# Patient Record
Sex: Female | Born: 1950 | Race: Black or African American | Hispanic: No | Smoking: Former smoker
Health system: Southern US, Community
[De-identification: ages and names within clinical notes are randomized; demographics above are authoritative.]

## PROBLEM LIST (undated history)

## (undated) DIAGNOSIS — M1990 Unspecified osteoarthritis, unspecified site: Secondary | ICD-10-CM

## (undated) DIAGNOSIS — N2889 Other specified disorders of kidney and ureter: Secondary | ICD-10-CM

## (undated) DIAGNOSIS — J45909 Unspecified asthma, uncomplicated: Secondary | ICD-10-CM

## (undated) DIAGNOSIS — C801 Malignant (primary) neoplasm, unspecified: Secondary | ICD-10-CM

## (undated) DIAGNOSIS — E079 Disorder of thyroid, unspecified: Secondary | ICD-10-CM

## (undated) DIAGNOSIS — R0602 Shortness of breath: Secondary | ICD-10-CM

## (undated) DIAGNOSIS — I8390 Asymptomatic varicose veins of unspecified lower extremity: Secondary | ICD-10-CM

## (undated) DIAGNOSIS — D689 Coagulation defect, unspecified: Secondary | ICD-10-CM

## (undated) DIAGNOSIS — R112 Nausea with vomiting, unspecified: Secondary | ICD-10-CM

## (undated) DIAGNOSIS — Z9889 Other specified postprocedural states: Secondary | ICD-10-CM

## (undated) DIAGNOSIS — I10 Essential (primary) hypertension: Secondary | ICD-10-CM

## (undated) HISTORY — PX: ABDOMINAL HYSTERECTOMY: SHX81

## (undated) HISTORY — DX: Disorder of thyroid, unspecified: E07.9

## (undated) HISTORY — DX: Other specified disorders of kidney and ureter: N28.89

## (undated) HISTORY — DX: Coagulation defect, unspecified: D68.9

## (undated) HISTORY — PX: VARICOSE VEIN SURGERY: SHX832

## (undated) HISTORY — DX: Shortness of breath: R06.02

## (undated) HISTORY — DX: Asymptomatic varicose veins of unspecified lower extremity: I83.90

---

## 2003-02-15 ENCOUNTER — Other Ambulatory Visit: Payer: Self-pay

## 2003-03-13 ENCOUNTER — Other Ambulatory Visit: Payer: Self-pay

## 2005-09-01 ENCOUNTER — Emergency Department: Payer: Self-pay | Admitting: Emergency Medicine

## 2006-09-23 ENCOUNTER — Inpatient Hospital Stay: Payer: Self-pay | Admitting: Surgery

## 2008-01-21 ENCOUNTER — Emergency Department: Payer: Self-pay | Admitting: Emergency Medicine

## 2008-03-24 ENCOUNTER — Ambulatory Visit: Payer: Self-pay

## 2008-08-20 ENCOUNTER — Emergency Department: Payer: Self-pay | Admitting: Emergency Medicine

## 2008-09-15 ENCOUNTER — Observation Stay: Payer: Self-pay | Admitting: Internal Medicine

## 2009-02-11 ENCOUNTER — Emergency Department: Payer: Self-pay | Admitting: Emergency Medicine

## 2009-05-25 ENCOUNTER — Emergency Department: Payer: Self-pay | Admitting: Emergency Medicine

## 2009-09-22 ENCOUNTER — Emergency Department: Payer: Self-pay | Admitting: Emergency Medicine

## 2010-01-09 ENCOUNTER — Emergency Department: Payer: Self-pay | Admitting: Emergency Medicine

## 2011-04-02 ENCOUNTER — Emergency Department: Payer: Self-pay | Admitting: Emergency Medicine

## 2011-09-13 ENCOUNTER — Ambulatory Visit: Payer: Self-pay | Admitting: Family Medicine

## 2012-03-11 ENCOUNTER — Emergency Department: Payer: Self-pay | Admitting: Emergency Medicine

## 2012-03-11 LAB — URINALYSIS, COMPLETE
Glucose,UR: NEGATIVE mg/dL (ref 0–75)
Nitrite: NEGATIVE
Ph: 5 (ref 4.5–8.0)
Protein: 30
RBC,UR: 1991 /HPF (ref 0–5)
Specific Gravity: 1.027 (ref 1.003–1.030)
Squamous Epithelial: 4

## 2012-03-11 LAB — COMPREHENSIVE METABOLIC PANEL
Albumin: 3.6 g/dL (ref 3.4–5.0)
Alkaline Phosphatase: 62 U/L (ref 50–136)
Anion Gap: 7 (ref 7–16)
BUN: 18 mg/dL (ref 7–18)
Co2: 24 mmol/L (ref 21–32)
EGFR (African American): >60
EGFR (Non-African Amer.): 56 — ABNORMAL LOW
Glucose: 102 mg/dL — ABNORMAL HIGH (ref 65–99)
Potassium: 2.9 mmol/L — ABNORMAL LOW (ref 3.5–5.1)
SGOT(AST): 19 U/L (ref 15–37)
SGPT (ALT): 16 U/L (ref 12–78)
Sodium: 138 mmol/L (ref 136–145)

## 2012-03-11 LAB — CBC
HGB: 13.7 g/dL (ref 12.0–16.0)
MCV: 89 fL (ref 80–100)
Platelet: 329 10*3/uL (ref 150–440)
WBC: 8.9 10*3/uL (ref 3.6–11.0)

## 2012-03-11 LAB — LIPASE, BLOOD: Lipase: 100 U/L (ref 73–393)

## 2012-03-12 LAB — URINE CULTURE

## 2012-06-09 ENCOUNTER — Emergency Department: Payer: Self-pay | Admitting: Internal Medicine

## 2013-04-10 ENCOUNTER — Ambulatory Visit: Payer: Self-pay | Admitting: Physician Assistant

## 2013-05-03 ENCOUNTER — Ambulatory Visit: Payer: Self-pay | Admitting: Family Medicine

## 2013-06-18 ENCOUNTER — Ambulatory Visit: Payer: Self-pay

## 2013-10-20 ENCOUNTER — Ambulatory Visit: Payer: Self-pay | Admitting: Family Medicine

## 2013-11-03 ENCOUNTER — Emergency Department: Payer: Self-pay | Admitting: Student

## 2014-03-13 ENCOUNTER — Emergency Department: Payer: Self-pay | Admitting: Emergency Medicine

## 2014-03-29 ENCOUNTER — Ambulatory Visit: Payer: Self-pay | Admitting: Family Medicine

## 2014-04-12 ENCOUNTER — Emergency Department: Payer: Self-pay | Admitting: Emergency Medicine

## 2014-04-12 LAB — CBC WITH DIFFERENTIAL/PLATELET
Basophil #: 0.1 10*3/uL (ref 0.0–0.1)
Basophil %: 1 %
Eosinophil #: 0.2 10*3/uL (ref 0.0–0.7)
Eosinophil %: 3.7 %
HCT: 39.2 % (ref 35.0–47.0)
HGB: 12.8 g/dL (ref 12.0–16.0)
LYMPHS PCT: 39.7 %
Lymphocyte #: 2.4 10*3/uL (ref 1.0–3.6)
MCH: 28.6 pg (ref 26.0–34.0)
MCHC: 32.7 g/dL (ref 32.0–36.0)
MCV: 87 fL (ref 80–100)
Monocyte #: 0.3 x10 3/mm (ref 0.2–0.9)
Monocyte %: 5.3 %
NEUTROS PCT: 50.3 %
Neutrophil #: 3.1 10*3/uL (ref 1.4–6.5)
Platelet: 324 10*3/uL (ref 150–440)
RBC: 4.49 10*6/uL (ref 3.80–5.20)
RDW: 13.3 % (ref 11.5–14.5)
WBC: 6.1 10*3/uL (ref 3.6–11.0)

## 2014-04-12 LAB — BASIC METABOLIC PANEL
ANION GAP: 7 (ref 7–16)
BUN: 17 mg/dL
CALCIUM: 9.2 mg/dL
CHLORIDE: 109 mmol/L
CO2: 25 mmol/L
Creatinine: 0.64 mg/dL
EGFR (African American): >60
EGFR (Non-African Amer.): >60
Glucose: 95 mg/dL
Potassium: 3.4 mmol/L — ABNORMAL LOW
Sodium: 141 mmol/L

## 2014-04-12 LAB — PRO B NATRIURETIC PEPTIDE: B-TYPE NATIURETIC PEPTID: 70 pg/mL

## 2014-09-08 ENCOUNTER — Emergency Department

## 2014-09-08 ENCOUNTER — Encounter: Payer: Self-pay | Admitting: Emergency Medicine

## 2014-09-08 ENCOUNTER — Observation Stay
Admission: EM | Admit: 2014-09-08 | Discharge: 2014-09-09 | Disposition: A | Discharge status: Home Health | Attending: Internal Medicine | Admitting: Internal Medicine

## 2014-09-08 DIAGNOSIS — R739 Hyperglycemia, unspecified: Secondary | ICD-10-CM | POA: Insufficient documentation

## 2014-09-08 DIAGNOSIS — F449 Dissociative and conversion disorder, unspecified: Principal | ICD-10-CM

## 2014-09-08 DIAGNOSIS — R531 Weakness: Secondary | ICD-10-CM

## 2014-09-08 DIAGNOSIS — F411 Generalized anxiety disorder: Secondary | ICD-10-CM | POA: Insufficient documentation

## 2014-09-08 DIAGNOSIS — R4182 Altered mental status, unspecified: Secondary | ICD-10-CM | POA: Diagnosis not present

## 2014-09-08 DIAGNOSIS — F419 Anxiety disorder, unspecified: Secondary | ICD-10-CM | POA: Diagnosis present

## 2014-09-08 DIAGNOSIS — R200 Anesthesia of skin: Secondary | ICD-10-CM | POA: Diagnosis not present

## 2014-09-08 DIAGNOSIS — I639 Cerebral infarction, unspecified: Secondary | ICD-10-CM | POA: Diagnosis present

## 2014-09-08 DIAGNOSIS — N390 Urinary tract infection, site not specified: Secondary | ICD-10-CM | POA: Diagnosis not present

## 2014-09-08 DIAGNOSIS — Z86718 Personal history of other venous thrombosis and embolism: Secondary | ICD-10-CM | POA: Diagnosis not present

## 2014-09-08 DIAGNOSIS — Z79899 Other long term (current) drug therapy: Secondary | ICD-10-CM | POA: Diagnosis not present

## 2014-09-08 DIAGNOSIS — I10 Essential (primary) hypertension: Secondary | ICD-10-CM | POA: Diagnosis not present

## 2014-09-08 DIAGNOSIS — R471 Dysarthria and anarthria: Secondary | ICD-10-CM | POA: Diagnosis not present

## 2014-09-08 DIAGNOSIS — R0602 Shortness of breath: Secondary | ICD-10-CM | POA: Diagnosis not present

## 2014-09-08 DIAGNOSIS — C801 Malignant (primary) neoplasm, unspecified: Secondary | ICD-10-CM | POA: Diagnosis not present

## 2014-09-08 DIAGNOSIS — E876 Hypokalemia: Secondary | ICD-10-CM | POA: Diagnosis not present

## 2014-09-08 DIAGNOSIS — J9811 Atelectasis: Secondary | ICD-10-CM | POA: Diagnosis not present

## 2014-09-08 HISTORY — DX: Essential (primary) hypertension: I10

## 2014-09-08 LAB — URINALYSIS COMPLETE WITH MICROSCOPIC (ARMC ONLY)
Bilirubin Urine: NEGATIVE
GLUCOSE, UA: NEGATIVE mg/dL
KETONES UR: NEGATIVE mg/dL
NITRITE: NEGATIVE
Protein, ur: NEGATIVE mg/dL
SPECIFIC GRAVITY, URINE: 1.008 (ref 1.005–1.030)
pH: 7 (ref 5.0–8.0)

## 2014-09-08 LAB — BASIC METABOLIC PANEL
Anion gap: 10 (ref 5–15)
BUN: 14 mg/dL (ref 6–20)
CO2: 24 mmol/L (ref 22–32)
Calcium: 9.9 mg/dL (ref 8.9–10.3)
Chloride: 107 mmol/L (ref 101–111)
Creatinine, Ser: 0.88 mg/dL (ref 0.44–1.00)
GFR calc Af Amer: >60 mL/min (ref >60)
GFR calc non Af Amer: >60 mL/min (ref >60)
Glucose, Bld: 104 mg/dL — ABNORMAL HIGH (ref 65–99)
Potassium: 3.4 mmol/L — ABNORMAL LOW (ref 3.5–5.1)
Sodium: 141 mmol/L (ref 135–145)

## 2014-09-08 LAB — CBC WITH DIFFERENTIAL/PLATELET
Basophils Absolute: 0 10*3/uL (ref 0–0.1)
Basophils Relative: 0 %
Eosinophils Absolute: 0 10*3/uL (ref 0–0.7)
Eosinophils Relative: 1 %
HCT: 40 % (ref 35.0–47.0)
Hemoglobin: 13.1 g/dL (ref 12.0–16.0)
Lymphocytes Relative: 19 %
Lymphs Abs: 1.4 10*3/uL (ref 1.0–3.6)
MCH: 28.3 pg (ref 26.0–34.0)
MCHC: 32.8 g/dL (ref 32.0–36.0)
MCV: 86 fL (ref 80.0–100.0)
Monocytes Absolute: 0.4 10*3/uL (ref 0.2–0.9)
Monocytes Relative: 5 %
Neutro Abs: 5.3 10*3/uL (ref 1.4–6.5)
Neutrophils Relative %: 75 %
Platelets: 322 10*3/uL (ref 150–440)
RBC: 4.65 MIL/uL (ref 3.80–5.20)
RDW: 13.5 % (ref 11.5–14.5)
WBC: 7.1 10*3/uL (ref 3.6–11.0)

## 2014-09-08 LAB — PREGNANCY, URINE: Preg Test, Ur: NEGATIVE

## 2014-09-08 LAB — FIBRIN DERIVATIVES D-DIMER (ARMC ONLY): Fibrin derivatives D-dimer (ARMC): 1054 — ABNORMAL HIGH (ref 0–499)

## 2014-09-08 LAB — TROPONIN I

## 2014-09-08 MED ORDER — FOSFOMYCIN TROMETHAMINE 3 G PO PACK
3.0000 g | PACK | Freq: Once | ORAL | Status: AC
  Administered 2014-09-09: 3 g via ORAL
  Filled 2014-09-08: qty 3

## 2014-09-08 MED ORDER — SODIUM CHLORIDE 0.9 % IV BOLUS (SEPSIS)
500.0000 mL | Freq: Once | INTRAVENOUS | Status: AC
  Administered 2014-09-08: 500 mL via INTRAVENOUS

## 2014-09-08 MED ORDER — IOHEXOL 300 MG/ML  SOLN
75.0000 mL | Freq: Once | INTRAMUSCULAR | Status: AC | PRN
  Administered 2014-09-08: 75 mL via INTRAVENOUS

## 2014-09-08 MED ORDER — ASPIRIN 81 MG PO CHEW
324.0000 mg | CHEWABLE_TABLET | Freq: Once | ORAL | Status: AC
  Administered 2014-09-08: 324 mg via ORAL
  Filled 2014-09-08: qty 4

## 2014-09-08 NOTE — ED Provider Notes (Signed)
[facility] Emergency Department Provider Note  ____________________________________________  Time seen: Approximately 5:47 PM  I have reviewed the triage vital signs and the nursing notes.   HISTORY  Chief Complaint Numbness and Anxiety  Caveat-history of present illness and review of systems limited second or to the patient's inability to cooperate with history telling. She nods her head to yes and no questions and answers "tight" when asked about the nature of her chest pain but does not verbalize otherwise.  HPI Janet [name] is a 64 y.o. female with history of hypertension, history of "blood clots" who presents for evaluation of shortness of breath, chest pain, generalized weakness. History is provided primarily by EMS as well as her family at bedside. She was at lunch earlier today when she ate some spicy shrimp and reported that she just "couldn't eat it". She has no history of allergy and has had shrimp on multiple occasions without any adverse effect. She then began complaining of chest tightness, screamed out in pain, her body went limp. On EMS arrival, her vital signs were stable, she had a normal glucose, she was hyperventilating and complaining of numbness in her hands, lips, feet, blinking rapidly. She is complaining of tightness in her chest and tells me that it has been constant since it started just prior to arrival. It radiates into her right neck. She has no other pain complaints. She has had this before but denies any history of coronary artery disease. She is unable to tell me about the current severity of her symptoms.  Family reports that she has not been able to talk since this happened earlier today. Of note, her mother was recently diagnosed with cancer and her family was meeting today at lunch and discussing that matter.   Past Medical History  Diagnosis Date  . Hypertension     There are no active problems to display for this  patient.   History reviewed. No pertinent past surgical history.  No current outpatient prescriptions on file.  Allergies Review of patient's allergies indicates no known allergies.  History reviewed. No pertinent family history.  Social History Social History  Substance Use Topics  . Smoking status: Never Smoker   . Smokeless tobacco: None  . Alcohol Use: No    Review of Systems  Cardiovascular: + chest tightness    Caveat-history of present illness and review of systems limited second or to the patient's inability to cooperate with history telling. She nods her head to yes and no questions and answers "tight" when asked about the nature of her chest pain but does not verbalize otherwise. ____________________________________________   PHYSICAL EXAM:  VITAL SIGNS: ED Triage Vitals  Enc Vitals Group     BP 09/08/14 1723 146/76 mmHg     Pulse Rate 09/08/14 1723 90     Resp 09/08/14 1723 24     Temp 09/08/14 1723 97.8 F (36.6 C)     Temp Source 09/08/14 1723 Oral     SpO2 09/08/14 1723 100 %     Weight 09/08/14 1723 209 lb 4.8 oz (94.938 kg)     Height 09/08/14 1723 5\' 7"  (1.702 m)     Head Cir --      Peak Flow --      Pain Score --      Pain Loc --      Pain Edu? --      Excl. in [place]? --     Constitutional: Alert, crying silently, nods  head yes or no to answer questions and only verbalizes "tight" with much prompting when asked about the nature of her chest pain, otherwise, she refuses to speak but follows commands to move all extremities which she appears to do equally but feels too weak to sit up. Eyes: Conjunctivae are normal. PERRL. EOMI. Head: Atraumatic. Nose: No congestion/rhinnorhea. Mouth/Throat: Mucous membranes are moist.  Oropharynx non-erythematous. Neck: No stridor.   Cardiovascular: Normal rate, regular rhythm. Grossly normal heart sounds.  Good peripheral circulation. Respiratory: Normal respiratory effort.  No retractions. Lungs  CTAB. Gastrointestinal: Soft and nontender. No distention. No abdominal bruits. No CVA tenderness. Genitourinary: Deferred Musculoskeletal: No lower extremity tenderness nor edema.  No joint effusions. Neurologic:  Follows commands and moves extremities equally and symmetrically but does not cooperate with formal neurological testing. Skin:  Skin is warm, dry and intact. No rash noted. Venous stasis color change associated with bilateral lower extremities but 2+ DP pulses bilaterally. Psychiatric: Mood is depressed and affect is flat.  ____________________________________________   LABS (all labs ordered are listed, but only abnormal results are displayed)  Labs Reviewed  BASIC METABOLIC PANEL - Abnormal; Notable for the following:    Potassium 3.4 (*)    Glucose, Bld 104 (*)    All other components within normal limits  FIBRIN DERIVATIVES D-DIMER ([facility] ONLY) - Abnormal; Notable for the following:    Fibrin derivatives D-dimer ([facility]) 1054 (*)    All other components within normal limits  URINALYSIS COMPLETEWITH MICROSCOPIC ([facility] ONLY) - Abnormal; Notable for the following:    Color, Urine YELLOW (*)    APPearance HAZY (*)    Hgb urine dipstick 1+ (*)    Leukocytes, UA TRACE (*)    Bacteria, UA RARE (*)    Squamous Epithelial / LPF 0-5 (*)    All other components within normal limits  CBC WITH DIFFERENTIAL/PLATELET  TROPONIN I  PREGNANCY, URINE  URINE DRUG SCREEN, QUALITATIVE ([facility] ONLY)  TROPONIN I   ____________________________________________  EKG  ED ECG REPORT I, Joanne Gavel, the attending physician, personally viewed and interpreted this ECG.   Date: 09/08/2014  EKG Time: 17:34  Rate: 89  Rhythm: normal EKG, normal sinus rhythm  Axis: normal  Intervals:none  ST&T Change: No acute ST segment elevation.  ____________________________________________  RADIOLOGY  CXR  FINDINGS: Trachea is midline. Heart size normal. Lungs are clear. No  pleural fluid.  IMPRESSION: No acute findings.   CT head FINDINGS: Streak artifacts at posterior fossa.  Normal ventricular morphology.  No midline shift or mass effect.  Normal appearance of brain parenchyma.  No intracranial hemorrhage, mass lesion, or acute infarction.  Visualized paranasal sinuses and mastoid air cells clear.  Bones unremarkable.  IMPRESSION: Normal exam.  Venous Doppler ultrasound of bilateral lower extremities  IMPRESSION: No evidence of deep venous thrombosis.  ____________________________________________   PROCEDURES  Procedure(s) performed: None  Critical Care performed: No  ____________________________________________   INITIAL IMPRESSION / ASSESSMENT AND PLAN / ED COURSE  Pertinent labs & imaging results that were available during my care of the patient were reviewed by me and considered in my medical decision making (see chart for details).  Janet [name] is a 64 y.o. female with history of hypertension, history of "blood clots" who presents for evaluation of shortness of breath, chest pain, generalized weakness. On exam, she is tearful, on arrival she was blinking her eyes rapidly, following commands but refusing to speak, only saying a few words. She appears to have a nonfocal  neurological examination and CT head is negative. Her EKG is unremarkable. First troponin is negative though given onset of pain just prior to arrival, will obtain second set. Awaiting BMP and d-dimer. EKG shows no evidence of acute ischemia. Exam is not consistent with acute CVA. Her clinical picture is most consistent with an acute stress reaction or anxiety related reaction in the setting of severe emotional stress which is understandable given her mother's recent diagnosis. Awaiting second troponin, d-dimer, BMP. If she continues not to speak, will obtain psychiatry consult.   ----------------------------------------- 9:20 PM on  09/08/2014 -----------------------------------------  Patient with stable vital signs. She still remains tearful, refusing to speak. Urinalysis with trace leuk esterase, 6-30 white blood cells, rare bacteria. CBC unremarkable. BMP unremarkable. First troponin negative. D-dimer was elevated at 1054. CTA chest negative for any acute PE. Incidentally, an indeterminate lesion was noted arising from the left kidney which could represent a cyst or enhancing neoplasm, nonemergent MRI recommended for follow-up. We'll repeat second troponin, if negative, given her pain has been constant for several hours, EKG is reassuring, I doubt ACS. Will give Aspirin. Not consistent with acute aortic dissection. Given my concern for somatization, will consult specialist on call/telepsychiatry. Her family at bedside is very concerned about her legs stating that her doctor told her "that there might be blood clots and they were going to send her to [facility] to have this checked out". I discussed with them that blood clots in the legs would not be causing her symptoms today however since she is here and we are waiting for the specialist on-call anyway, we'll obtain venous Doppler ultrasound of bilateral lower extremities to evaluate for DVT.  ----------------------------------------- 10:03 PM on 09/08/2014 -----------------------------------------  Urinalysis with trace leuk esterase, few white blood cells. The patient is able to tell me that she has no dysuria, no increased urinary frequency but she does have a history of frequent urinary tract infections. I discussed with her that we will give her 1 dose of fosfomycin here. She is able to whisper to me that she is allergic to "penicillin" when questioned.  ----------------------------------------- 11:01 PM on 09/08/2014 ----------------------------------------- Second troponin pending. Doppler ultrasound of the lower extremities is also pending. Dr. Edison Nasuti,  [facility] has evaluated the  patient, suspect possible conversion disorder however given the patient's complaints of numbness of the hands, her persistent generalized weakness with inability to walk or sit up, acute CVA possible affecting brain stem cannot be ruled out (though this would be exceptionally rare). I discussed the case with the hospitalist for admission for MRI brain, observation.  ____________________________________________   FINAL CLINICAL IMPRESSION(S) / ED DIAGNOSES  Final diagnoses:  Anxiety reaction  Weakness      Joanne Gavel, MD 09/08/14 2356

## 2014-09-08 NOTE — Discharge Instructions (Signed)
You were seen in the emergency department for symptoms which are likely related to a stress reaction. While you are having chest pain, your cardiac enzymes were unremarkable as was the CT scan of your chest. I do not think you are having a heart attack or a blood clot in your lung. Your symptoms did not appear to be consistent with a stroke and your CT scan of her head was normal. Incidentally, on the CT scan of your chest, we were able to see a portion of your left kidney. A small area of the left kidney was concerning for a possible cyst though it is not clear what exactly this abnormality is at this time. You need to follow up with your primary care doctor as soon as possible regarding today's visit so that an MRI of your kidneys can be done to make sure this i isn't something more serious, like a cancer. In any case, this kidney abnormality on your CT scan is not the cause of your symptoms today. See your doctor as soon as possible so that the MRI can be scheduled - Call your doctor first thing in the morning.  Return immediately to the emergency Department for severe or worsening chest pain, difficulty breathing, worsening numbness or weakness, fevers, thoughts of wanting to hurt self or others, hallucinations, fevers, vomiting, vision change or for any other concerns.

## 2014-09-08 NOTE — ED Notes (Signed)
Pt requesting to use restroom and wants to get up.  This nurse and Delilah Shan, RN assessed patient ability to walk.  Pt refusing to walk and lift legs when asked.  Pt placed on bedpan, pt used legs to lift pelvis.

## 2014-09-08 NOTE — ED Notes (Signed)
Pt to ED from dinner with family via EMS c/o numbness to finger tips, toes, legs, and lips.  Per EMS pt mother recently dx with cancer.  Pt denies any pain at this time, only hx of hypertension.  Pt presents hyperventilating and very anxious.  Pt having trouble getting words out at this time.  Pt is A&Ox4 and in NAD at this time.

## 2014-09-09 ENCOUNTER — Encounter: Payer: Self-pay | Admitting: Internal Medicine

## 2014-09-09 ENCOUNTER — Observation Stay

## 2014-09-09 DIAGNOSIS — R531 Weakness: Secondary | ICD-10-CM

## 2014-09-09 DIAGNOSIS — F449 Dissociative and conversion disorder, unspecified: Secondary | ICD-10-CM

## 2014-09-09 LAB — URINE DRUG SCREEN, QUALITATIVE (ARMC ONLY)
AMPHETAMINES, UR SCREEN: NOT DETECTED
BENZODIAZEPINE, UR SCRN: NOT DETECTED
Barbiturates, Ur Screen: NOT DETECTED
Cannabinoid 50 Ng, Ur ~~LOC~~: NOT DETECTED
Cocaine Metabolite,Ur ~~LOC~~: NOT DETECTED
MDMA (Ecstasy)Ur Screen: NOT DETECTED
Methadone Scn, Ur: NOT DETECTED
OPIATE, UR SCREEN: NOT DETECTED
PHENCYCLIDINE (PCP) UR S: NOT DETECTED
Tricyclic, Ur Screen: NOT DETECTED

## 2014-09-09 LAB — TROPONIN I: Troponin I: <0.03 ng/mL (ref <0.031)

## 2014-09-09 LAB — MAGNESIUM: Magnesium: 2.1 mg/dL (ref 1.7–2.4)

## 2014-09-09 LAB — GLUCOSE, CAPILLARY: Glucose-Capillary: 102 mg/dL — ABNORMAL HIGH (ref 65–99)

## 2014-09-09 LAB — POTASSIUM: Potassium: 3.2 mmol/L — ABNORMAL LOW (ref 3.5–5.1)

## 2014-09-09 LAB — HEMOGLOBIN A1C: Hgb A1c MFr Bld: 6 % (ref 4.0–6.0)

## 2014-09-09 MED ORDER — POTASSIUM CHLORIDE CRYS ER 20 MEQ PO TBCR
40.0000 meq | EXTENDED_RELEASE_TABLET | Freq: Once | ORAL | Status: AC
  Administered 2014-09-09: 40 meq via ORAL
  Filled 2014-09-09: qty 2

## 2014-09-09 MED ORDER — ACETAMINOPHEN 650 MG RE SUPP: 650.0000 mg | Freq: Four times a day (QID) | RECTAL | Status: DC | PRN

## 2014-09-09 MED ORDER — ACETAMINOPHEN 325 MG PO TABS: 650.0000 mg | ORAL_TABLET | Freq: Four times a day (QID) | ORAL | Status: DC | PRN

## 2014-09-09 MED ORDER — SODIUM CHLORIDE 0.9 % IJ SOLN
3.0000 mL | Freq: Two times a day (BID) | INTRAMUSCULAR | Status: DC
  Administered 2014-09-09: 3 mL via INTRAVENOUS

## 2014-09-09 MED ORDER — LORAZEPAM 0.5 MG PO TABS: 0.5000 mg | ORAL_TABLET | ORAL | Status: DC | PRN

## 2014-09-09 MED ORDER — ONDANSETRON HCL 4 MG PO TABS: 4.0000 mg | ORAL_TABLET | Freq: Four times a day (QID) | ORAL | Status: DC | PRN

## 2014-09-09 MED ORDER — LISINOPRIL 20 MG PO TABS
40.0000 mg | ORAL_TABLET | Freq: Every day | ORAL | Status: DC
  Administered 2014-09-09: 40 mg via ORAL
  Filled 2014-09-09: qty 2

## 2014-09-09 MED ORDER — ONDANSETRON HCL 4 MG/2ML IJ SOLN: 4.0000 mg | Freq: Four times a day (QID) | INTRAMUSCULAR | Status: DC | PRN

## 2014-09-09 MED ORDER — OXYCODONE HCL 5 MG PO TABS: 5.0000 mg | ORAL_TABLET | ORAL | Status: DC | PRN

## 2014-09-09 MED ORDER — POTASSIUM CHLORIDE 20 MEQ PO PACK
40.0000 meq | PACK | Freq: Two times a day (BID) | ORAL | Status: DC
  Administered 2014-09-09: 40 meq via ORAL
  Filled 2014-09-09: qty 2

## 2014-09-09 MED ORDER — ASPIRIN EC 81 MG PO TBEC
81.0000 mg | DELAYED_RELEASE_TABLET | Freq: Every day | ORAL | Status: DC
  Administered 2014-09-09: 81 mg via ORAL
  Filled 2014-09-09: qty 1

## 2014-09-09 MED ORDER — SODIUM CHLORIDE 0.9 % IV SOLN
INTRAVENOUS | Status: DC
  Administered 2014-09-09 (×2): via INTRAVENOUS

## 2014-09-09 MED ORDER — MORPHINE SULFATE (PF) 2 MG/ML IV SOLN
2.0000 mg | INTRAVENOUS | Status: DC | PRN
Start: 2014-09-09 — End: 2014-09-09

## 2014-09-09 NOTE — Care Management Note (Signed)
Bedside nursing screening done, no ss of aspiration noted. Pt not hold ice in the mouth, able to swallow with no problem.

## 2014-09-09 NOTE — H&P (Signed)
[facility] NAME: Janet [name]    MR#:  [id number]  DATE OF BIRTH:  07-25-1950   DATE OF ADMISSION:  09/08/2014  PRIMARY CARE PHYSICIAN: No primary care provider on file.   REQUESTING/REFERRING PHYSICIAN: Edd Fabian  CHIEF COMPLAINT:   Chief Complaint  Patient presents with  . Numbness  . Anxiety    HISTORY OF PRESENT ILLNESS:  Janet [name]  is a 64 y.o. female with a known history of essential hypertension presenting with acute onset of numbness. She is unable to further describe her medical condition in any detail whatsoever. History obtained from family members present at bedside who also have no idea. Apparently from the information available the patient was eating shrimp pasta, screamed "its so spicy' followed by yelling in pain. She then complained of numbness in her fingertips, toes, mouth. Upon presentation to the hospital she no longer answers questions and is essentially nonverbal. Telepsychiatry was consulted and evaluated the patient for concern of conversion disorder however states she should undergo stroke rule out.  PAST MEDICAL HISTORY:   Past Medical History  Diagnosis Date  . Hypertension     PAST SURGICAL HISTORY:  History reviewed. No pertinent past surgical history.  SOCIAL HISTORY:   Social History  Substance Use Topics  . Smoking status: Never Smoker   . Smokeless tobacco: Not on file  . Alcohol Use: No    FAMILY HISTORY:   Family History  Problem Relation Age of Onset  . Stroke Neg Hx     DRUG ALLERGIES:  No Known Allergies  REVIEW OF SYSTEMS:  Unable to obtain given patient's mental status/medical condition  MEDICATIONS AT HOME:   Prior to Admission medications   Medication Sig Start Date End Date Taking? Authorizing Provider  furosemide (LASIX) 20 MG tablet Take 2 tablets by mouth daily as needed. tk 2 tablets every morning as needed for fluid retention 07/06/14  Yes Historical  Provider, MD  lisinopril (PRINIVIL,ZESTRIL) 40 MG tablet Take 1 tablet by mouth daily. 08/27/14  Yes Historical Provider, MD      VITAL SIGNS:  Blood pressure 155/97, pulse 80, temperature 97.8 F (36.6 C), temperature source Oral, resp. rate 24, height 5\' 7"  (1.702 m), weight 209 lb 4.8 oz (94.938 kg), SpO2 100 %.  PHYSICAL EXAMINATION:   VITAL SIGNS: Filed Vitals:   09/09/14 0030  BP: 155/97  Pulse: 80  Temp:   Resp: 16   GENERAL:64 y.o.female moderate distress given mental status.  HEAD: Normocephalic, atraumatic.  EYES: Pupils equal, round, reactive to light. Unable to assess extraocular muscles given mental status/medical condition. No scleral icterus.  MOUTH: Moist mucosal membrane. Dentition intact. No abscess noted.  EAR, NOSE, THROAT: Clear without exudates. No external lesions.  NECK: Supple. No thyromegaly. No nodules. No JVD.  PULMONARY: Clear to ascultation, without wheeze rails or rhonci. No use of accessory muscles, Good respiratory effort. good air entry bilaterally CHEST: Nontender to palpation.  CARDIOVASCULAR: S1 and S2. Regular rate and rhythm. No murmurs, rubs, or gallops. No edema. Pedal pulses 2+ bilaterally.  GASTROINTESTINAL: Soft, nontender, nondistended. No masses. Positive bowel sounds. No hepatosplenomegaly.  MUSCULOSKELETAL: No swelling, clubbing, or edema. Range of motion full in all extremities.  NEUROLOGIC: Unable to fully assess given mental status/medical condition. She is able to move her fingers and toes on command however is unable to move any proximal muscle group. When her arm is raised above her head and dropped she does not  hit her face SKIN: No ulceration, lesions, rashes, or cyanosis. Skin warm and dry. Turgor intact.  PSYCHIATRIC: Unable to assess given mental status/medical condition     LABORATORY PANEL:   CBC  Recent Labs Lab 09/08/14 1833  WBC 7.1  HGB 13.1  HCT 40.0  PLT 322    ------------------------------------------------------------------------------------------------------------------  Chemistries   Recent Labs Lab 09/08/14 1833  NA 141  K 3.4*  CL 107  CO2 24  GLUCOSE 104*  BUN 14  CREATININE 0.88  CALCIUM 9.9   ------------------------------------------------------------------------------------------------------------------  Cardiac Enzymes  Recent Labs Lab 09/08/14 2153  TROPONINI <0.03   ------------------------------------------------------------------------------------------------------------------  RADIOLOGY:  Ct Head Wo Contrast  09/08/2014   CLINICAL DATA:  Numbness in finger tips, toes, legs and lips, recently diagnosed with cancer (type not specified), hyperventilation, trouble getting words out, history hypertension  EXAM: CT HEAD WITHOUT CONTRAST  TECHNIQUE: Contiguous axial images were obtained from the base of the skull through the vertex without intravenous contrast.  COMPARISON:  None  FINDINGS: Streak artifacts at posterior fossa.  Normal ventricular morphology.  No midline shift or mass effect.  Normal appearance of brain parenchyma.  No intracranial hemorrhage, mass lesion, or acute infarction.  Visualized paranasal sinuses and mastoid air cells clear.  Bones unremarkable.  IMPRESSION: Normal exam.   Electronically Signed   By: Lavonia [name] M.D.   On: 09/08/2014 18:25   Ct Angio Chest Pe W/cm &/or Wo Cm  09/08/2014   CLINICAL DATA:  Numbness to finger tips toes legs and lips. History of hypertension. Hyperventilation and anxious.  EXAM: CT ANGIOGRAPHY CHEST WITH CONTRAST  TECHNIQUE: Multidetector CT imaging of the chest was performed using the standard protocol during bolus administration of intravenous contrast. Multiplanar CT image reconstructions and MIPs were obtained to evaluate the vascular anatomy.  CONTRAST:  40mL OMNIPAQUE IOHEXOL 300 MG/ML  SOLN  COMPARISON:  None.  FINDINGS: Mediastinum: Normal heart size. No  pericardial effusion noted. The esophagus appears normal. Normal appearance of the trachea. No mediastinal or hilar adenopathy. The main pulmonary artery is patent. Left and right pulmonary arteries are also patent without saddle embolus. No lobar or segmental pulmonary artery filling defect identified.  Lungs/Pleura: No pleural effusion identified. Subsegmental atelectasis noted in the posterior lung bases. No airspace consolidation or atelectasis identified.  Upper Abdomen: The visualized liver and spleen are unremarkable. The adrenal glands are unremarkable. The visualized portions of the pancreas normal. There is a suspicious lesion arising from the upper pole of the left kidney. This measures 1.8 cm and 35.9 Hounsfield units postcontrast. This may represent a hyperdense cyst or solid enhancing neoplasm.  Musculoskeletal: The visualized osseous structures are unremarkable. There are no aggressive lytic or sclerotic bone lesions noted.  Review of the MIP images confirms the above findings.  IMPRESSION: 1. No evidence for acute pulmonary embolus. 2. Indeterminate lesion arising from the left kidney. This could represent a hyperdense cyst or solid enhancing neoplasm. Followup imaging with renal mass protocol MRI or CT is recommended. MRI would be the study of choice. These results will be called to the ordering clinician or representative by the Radiologist Assistant, and communication documented in the PACS or zVision Dashboard.   Electronically Signed   By: Kerby Moors M.D.   On: 09/08/2014 20:51   US Venous Img Lower Bilateral  09/08/2014   CLINICAL DATA:  History of blood clots.  EXAM: BILATERAL LOWER EXTREMITY VENOUS DOPPLER ULTRASOUND  TECHNIQUE: Gray-scale sonography with graded compression, as well as color Doppler and  duplex ultrasound were performed to evaluate the lower extremity deep venous systems from the level of the common femoral vein and including the common femoral, femoral, profunda  femoral, popliteal and calf veins including the posterior tibial, peroneal and gastrocnemius veins when visible. The superficial great saphenous vein was also interrogated. Spectral Doppler was utilized to evaluate flow at rest and with distal augmentation maneuvers in the common femoral, femoral and popliteal veins.  COMPARISON:  None.  FINDINGS: RIGHT LOWER EXTREMITY  Common Femoral Vein: No evidence of thrombus. Normal compressibility, respiratory phasicity and response to augmentation.  Saphenofemoral Junction: No evidence of thrombus. Normal compressibility and flow on color Doppler imaging.  Profunda Femoral Vein: No evidence of thrombus. Normal compressibility and flow on color Doppler imaging.  Femoral Vein: No evidence of thrombus. Normal compressibility, respiratory phasicity and response to augmentation.  Popliteal Vein: No evidence of thrombus. Normal compressibility, respiratory phasicity and response to augmentation.  Calf Veins: No evidence of thrombus. Normal compressibility and flow on color Doppler imaging.  Superficial Great Saphenous Vein: No evidence of thrombus. Normal compressibility and flow on color Doppler imaging.  Venous Reflux:  None.  Other Findings:  None.  LEFT LOWER EXTREMITY  Common Femoral Vein: No evidence of thrombus. Normal compressibility, respiratory phasicity and response to augmentation.  Saphenofemoral Junction: No evidence of thrombus. Normal compressibility and flow on color Doppler imaging.  Profunda Femoral Vein: No evidence of thrombus. Normal compressibility and flow on color Doppler imaging.  Femoral Vein: No evidence of thrombus. Normal compressibility, respiratory phasicity and response to augmentation.  Popliteal Vein: No evidence of thrombus. Normal compressibility, respiratory phasicity and response to augmentation.  Calf Veins: No evidence of thrombus. Normal compressibility and flow on color Doppler imaging.  Superficial Great Saphenous Vein: No evidence of  thrombus. Normal compressibility and flow on color Doppler imaging.  Venous Reflux:  None.  Other Findings:  None.  IMPRESSION: No evidence of deep venous thrombosis.   Electronically Signed   By: Kathreen Devoid   On: 09/08/2014 23:51   Dg Chest Portable 1 View  09/08/2014   CLINICAL DATA:  Shortness of breath.  EXAM: PORTABLE CHEST - 1 VIEW  COMPARISON:  09/13/2011.  FINDINGS: Trachea is midline. Heart size normal. Lungs are clear. No pleural fluid.  IMPRESSION: No acute findings.   Electronically Signed   By: Lorin Picket M.D.   On: 09/08/2014 17:33    EKG:   Orders placed or performed during the hospital encounter of 09/08/14  . ED EKG  . ED EKG    IMPRESSION AND PLAN:   64 year old African-American female history of essential hypertension presenting with generalized weakness. 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 Generalized weakness: Likely secondary to above diagnosis, however concern for CVA, will check MRI, place on telemetry, initiate aspirin therapy 3. Hypokalemia: Replace potassium Venous thrombi embolism prophylactic: SCDs   All the records are reviewed and case discussed with ED provider. Management plans discussed with the patient, family and they are in agreement.  CODE STATUS: Full  TOTAL TIME TAKING CARE OF THIS PATIENT: 35 minutes.    Hower,  Karenann Cai.D on 09/09/2014 at 12:45 AM  Between 7am to 6pm - Pager - [phone]  After 6pm: House Pager: - [phone]  [facility]  Office  [phone]  CC: Primary care physician; No primary care provider on file.

## 2014-09-09 NOTE — Progress Notes (Signed)
Talked with Dr Lewie Loron about family needing to transfer patient to [place], MD does not want to give orders, states she will get hold of Dr Ether Griffins for orders, family updated. Family very upset at this time.

## 2014-09-09 NOTE — Discharge Summary (Signed)
[facility] [place] NAME: Janet [name]    MR#:  [id number]  DATE OF BIRTH:  [birth year]  DATE OF ADMISSION:  09/08/2014 ADMITTING PHYSICIAN: Lytle Butte, MD  DATE OF DISCHARGE: 09/09/2014  6:54 PM  PRIMARY CARE PHYSICIAN: No primary care provider on file.     ADMISSION DIAGNOSIS:  Anxiety reaction [F41.9] Weakness [R53.1] CVA (cerebral infarction) [I63.9] Hx of blood clots [Z86.718]  DISCHARGE DIAGNOSIS:  Principal Problem:   Conversion disorder Active Problems:   Generalized weakness   SECONDARY DIAGNOSIS:   Past Medical History  Diagnosis Date  . Hypertension     .pro HOSPITAL COURSE:  The patient is 64 year old African-American female who presents to the hospital with numbness and anxiety after she was diagnosed with unknown cancer. She refused to speak, although was able to verbalize "tight" In emergency room, prompting admission for altered mental status, suspected conversion disorder. She underwent MRI of brain which showed no stroke and was seen by DR Clapacs, psychiatrist, who felt that patient indeed has conversion disorder, but did not recommend psychiatric hospitalization or medical therapy. Family was clearly not satisfied and requested transfer to [facility], which i refused due to no medical reason for transfer. Family decided to get patient discharged, discharge instructions prepared, to be discharged with home health PT and psychiatric nurse. Discussion by problem 1. Conversion disorder, appreciate psychiatrist input,  as well as physical therapist evaluation. Discharge home with home health PT and psych nurse follow up. MRI of the brain was negative for stroke.  2. Urinary tract infection?, Given fosfomycin in the emergency room, no further  intervention  3. Hypokalemia. Supplemented IV, magnesium levels was good 4. Hyperglycemia. Hemoglobin A1c is 6.0, no diabetes 5. Unknown cancer, needs supportive therapy,  psychologist as outpatient. Psych nurse follow up at home.  DISCHARGE CONDITIONS:   stable  CONSULTS OBTAINED:  Treatment Team:  Lytle Butte, MD Gonzella Lex, MD  DRUG ALLERGIES:  No Known Allergies  DISCHARGE MEDICATIONS:   Discharge Medication List as of 09/09/2014  6:29 PM    CONTINUE these medications which have NOT CHANGED   Details  furosemide (LASIX) 20 MG tablet Take 2 tablets by mouth daily as needed. tk 2 tablets every morning as needed for fluid retention, Starting 07/06/2014, Until Discontinued, Historical Med    lisinopril (PRINIVIL,ZESTRIL) 40 MG tablet Take 1 tablet by mouth daily., Starting 08/27/2014, Until Discontinued, Historical Med         DISCHARGE INSTRUCTIONS:    Follow up with PCP in few days after discharge.  If you experience worsening of your admission symptoms, develop shortness of breath, life threatening emergency, suicidal or homicidal thoughts you must seek medical attention immediately by calling 911 or calling your MD immediately  if symptoms less severe.  You Must read complete instructions/literature along with all the possible adverse reactions/side effects for all the Medicines you take and that have been prescribed to you. Take any new Medicines after you have completely understood and accept all the possible adverse reactions/side effects.   Please note  You were cared for by a hospitalist during your hospital stay. If you have any questions about your discharge medications or the care you received while you were in the hospital after you are discharged, you can call the unit and asked to speak with the hospitalist on call if the hospitalist that took care of you is not available. Once you are discharged, your primary care physician will  handle any further medical issues. Please note that NO REFILLS for any discharge medications will be authorized once you are discharged, as it is imperative that you return to your primary care  physician (or establish a relationship with a primary care physician if you do not have one) for your aftercare needs so that they can reassess your need for medications and monitor your lab values.    Today   CHIEF COMPLAINT:   Chief Complaint  Patient presents with  . Numbness  . Anxiety    HISTORY OF PRESENT ILLNESS:  Janet [name]  is a 64 y.o. female with a known history of HTN  who presents to the hospital with numbness and anxiety after she was diagnosed with unknown cancer. She refused to speak, although was able to verbalize "tight" In emergency room, prompting admission for altered mental status, suspected conversion disorder. She underwent MRI of brain which showed no stroke and was seen by DR Clapacs, psychiatrist, who felt that patient indeed has conversion disorder, but did not recommend psychiatric hospitalization or medical therapy. Family was clearly not satisfied and requested transfer to [facility], which i refused due to no medical reason for transfer. Family decided to get patient discharged, discharge instructions prepared, to be discharged with home health PT and psychiatric nurse. Discussion by problem 1. Conversion disorder, appreciate psychiatrist input,  as well as physical therapist evaluation. Discharge home with home health PT and psych nurse follow up. MRI of the brain was negative for stroke.  2. Urinary tract infection?, Given fosfomycin in the emergency room, no further  intervention  3. Hypokalemia. Supplemented IV, magnesium levels was good 4. Hyperglycemia. Hemoglobin A1c is 6.0, no diabetes 5. Unknown cancer, needs supportive therapy, psychologist as outpatient. Psych nurse follow up at home.   VITAL SIGNS:  Blood pressure 134/71, pulse 77, temperature 98.5 F (36.9 C), temperature source Oral, resp. rate 18, height 5\' 7"  (1.702 m), weight 94.938 kg (209 lb 4.8 oz), SpO2 98 %.  I/O:   Intake/Output Summary (Last 24 hours) at 09/09/14 1854 Last data filed  at 09/09/14 1403  Gross per 24 hour  Intake    240 ml  Output   1125 ml  Net   -885 ml    PHYSICAL EXAMINATION:  GENERAL:  64 y.o.-year-old patient lying in the bed with no acute distress, blinking eyelids, able to follow some commands, tearful.Marland Kitchen  EYES: Pupils equal, round, reactive to light and accommodation. No scleral icterus. Extraocular muscles intact.  HEENT: Head atraumatic, normocephalic. Oropharynx and nasopharynx clear.  NECK:  Supple, no jugular venous distention. No thyroid enlargement, no tenderness.  LUNGS: Normal breath sounds bilaterally, no wheezing, rales,rhonchi or crepitation. No use of accessory muscles of respiration.  CARDIOVASCULAR: S1, S2 normal. No murmurs, rubs, or gallops.  ABDOMEN: Soft, non-tender, non-distended. Bowel sounds present. No organomegaly or mass.  EXTREMITIES: No pedal edema, cyanosis, or clubbing.  NEUROLOGIC: Cranial nerves II through XII are intact. Muscle strength 5/5 in all extremities. Sensation intact. Gait not checked.  PSYCHIATRIC: The patient is somnolent, nonverbal.  SKIN: No obvious rash, lesion, or ulcer.   DATA REVIEW:   CBC  Recent Labs Lab 09/08/14 1833  WBC 7.1  HGB 13.1  HCT 40.0  PLT 322    Chemistries   Recent Labs Lab 09/08/14 1833 09/09/14 1319  NA 141  --   K 3.4* 3.2*  CL 107  --   CO2 24  --   GLUCOSE 104*  --   BUN 14  --  CREATININE 0.88  --   CALCIUM 9.9  --   MG  --  2.1    Cardiac Enzymes  Recent Labs Lab 09/08/14 2153  TROPONINI <0.03    Microbiology Results  Results for orders placed or performed in visit on 03/11/12  Urine culture     Status: None   Collection Time: 03/11/12 11:34 AM  Result Value Ref Range Status   Micro Text Report   Final       SOURCE: CLEAN CATCH    ORGANISM 1                4000 CFU/ML GRAM POSITIVE COCCI   ANTIBIOTIC                                                        RADIOLOGY:  Ct Head Wo Contrast  09/08/2014   CLINICAL DATA:  Numbness in  finger tips, toes, legs and lips, recently diagnosed with cancer (type not specified), hyperventilation, trouble getting words out, history hypertension  EXAM: CT HEAD WITHOUT CONTRAST  TECHNIQUE: Contiguous axial images were obtained from the base of the skull through the vertex without intravenous contrast.  COMPARISON:  None  FINDINGS: Streak artifacts at posterior fossa.  Normal ventricular morphology.  No midline shift or mass effect.  Normal appearance of brain parenchyma.  No intracranial hemorrhage, mass lesion, or acute infarction.  Visualized paranasal sinuses and mastoid air cells clear.  Bones unremarkable.  IMPRESSION: Normal exam.   Electronically Signed   By: Lavonia [name] M.D.   On: 09/08/2014 18:25   Ct Angio Chest Pe W/cm &/or Wo Cm  09/08/2014   CLINICAL DATA:  Numbness to finger tips toes legs and lips. History of hypertension. Hyperventilation and anxious.  EXAM: CT ANGIOGRAPHY CHEST WITH CONTRAST  TECHNIQUE: Multidetector CT imaging of the chest was performed using the standard protocol during bolus administration of intravenous contrast. Multiplanar CT image reconstructions and MIPs were obtained to evaluate the vascular anatomy.  CONTRAST:  101mL OMNIPAQUE IOHEXOL 300 MG/ML  SOLN  COMPARISON:  None.  FINDINGS: Mediastinum: Normal heart size. No pericardial effusion noted. The esophagus appears normal. Normal appearance of the trachea. No mediastinal or hilar adenopathy. The main pulmonary artery is patent. Left and right pulmonary arteries are also patent without saddle embolus. No lobar or segmental pulmonary artery filling defect identified.  Lungs/Pleura: No pleural effusion identified. Subsegmental atelectasis noted in the posterior lung bases. No airspace consolidation or atelectasis identified.  Upper Abdomen: The visualized liver and spleen are unremarkable. The adrenal glands are unremarkable. The visualized portions of the pancreas normal. There is a suspicious lesion arising from  the upper pole of the left kidney. This measures 1.8 cm and 35.9 Hounsfield units postcontrast. This may represent a hyperdense cyst or solid enhancing neoplasm.  Musculoskeletal: The visualized osseous structures are unremarkable. There are no aggressive lytic or sclerotic bone lesions noted.  Review of the MIP images confirms the above findings.  IMPRESSION: 1. No evidence for acute pulmonary embolus. 2. Indeterminate lesion arising from the left kidney. This could represent a hyperdense cyst or solid enhancing neoplasm. Followup imaging with renal mass protocol MRI or CT is recommended. MRI would be the study of choice. These results will be called to the ordering clinician or representative by the Radiologist Assistant, and communication  documented in the PACS or zVision Dashboard.   Electronically Signed   By: Kerby Moors M.D.   On: 09/08/2014 20:51   Mr Brain Wo Contrast  09/09/2014   CLINICAL DATA:  Numbness in the fingertips, toes, legs, and lips since last night.  EXAM: MRI HEAD WITHOUT CONTRAST  TECHNIQUE: Multiplanar, multiecho pulse sequences of the brain and surrounding structures were obtained without intravenous contrast.  COMPARISON:  Head CT from yesterday  FINDINGS: Calvarium and upper cervical spine: No focal marrow signal abnormality.  Orbits: No significant findings.  Sinuses and Mastoids: Clear. Mastoid and middle ears are clear.  Brain: No acute or remote infarct, hemorrhage, hydrocephalus, or mass lesion. No evidence of large vessel occlusion.  Two punctate FLAIR hyperintense foci in the cerebral white matter are normal for this age.  IMPRESSION: Unremarkable brain MRI.  No acute infarct.   Electronically Signed   By: Monte Fantasia M.D.   On: 09/09/2014 08:23   US Venous Img Lower Bilateral  09/08/2014   CLINICAL DATA:  History of blood clots.  EXAM: BILATERAL LOWER EXTREMITY VENOUS DOPPLER ULTRASOUND  TECHNIQUE: Gray-scale sonography with graded compression, as well as color  Doppler and duplex ultrasound were performed to evaluate the lower extremity deep venous systems from the level of the common femoral vein and including the common femoral, femoral, profunda femoral, popliteal and calf veins including the posterior tibial, peroneal and gastrocnemius veins when visible. The superficial great saphenous vein was also interrogated. Spectral Doppler was utilized to evaluate flow at rest and with distal augmentation maneuvers in the common femoral, femoral and popliteal veins.  COMPARISON:  None.  FINDINGS: RIGHT LOWER EXTREMITY  Common Femoral Vein: No evidence of thrombus. Normal compressibility, respiratory phasicity and response to augmentation.  Saphenofemoral Junction: No evidence of thrombus. Normal compressibility and flow on color Doppler imaging.  Profunda Femoral Vein: No evidence of thrombus. Normal compressibility and flow on color Doppler imaging.  Femoral Vein: No evidence of thrombus. Normal compressibility, respiratory phasicity and response to augmentation.  Popliteal Vein: No evidence of thrombus. Normal compressibility, respiratory phasicity and response to augmentation.  Calf Veins: No evidence of thrombus. Normal compressibility and flow on color Doppler imaging.  Superficial Great Saphenous Vein: No evidence of thrombus. Normal compressibility and flow on color Doppler imaging.  Venous Reflux:  None.  Other Findings:  None.  LEFT LOWER EXTREMITY  Common Femoral Vein: No evidence of thrombus. Normal compressibility, respiratory phasicity and response to augmentation.  Saphenofemoral Junction: No evidence of thrombus. Normal compressibility and flow on color Doppler imaging.  Profunda Femoral Vein: No evidence of thrombus. Normal compressibility and flow on color Doppler imaging.  Femoral Vein: No evidence of thrombus. Normal compressibility, respiratory phasicity and response to augmentation.  Popliteal Vein: No evidence of thrombus. Normal compressibility,  respiratory phasicity and response to augmentation.  Calf Veins: No evidence of thrombus. Normal compressibility and flow on color Doppler imaging.  Superficial Great Saphenous Vein: No evidence of thrombus. Normal compressibility and flow on color Doppler imaging.  Venous Reflux:  None.  Other Findings:  None.  IMPRESSION: No evidence of deep venous thrombosis.   Electronically Signed   By: Kathreen Devoid   On: 09/08/2014 23:51   Dg Chest Portable 1 View  09/08/2014   CLINICAL DATA:  Shortness of breath.  EXAM: PORTABLE CHEST - 1 VIEW  COMPARISON:  09/13/2011.  FINDINGS: Trachea is midline. Heart size normal. Lungs are clear. No pleural fluid.  IMPRESSION: No acute findings.  Electronically Signed   By: Lorin Picket M.D.   On: 09/08/2014 17:33    EKG:   Orders placed or performed during the hospital encounter of 09/08/14  . ED EKG  . ED EKG  . EKG 12-Lead  . EKG 12-Lead      Management plans discussed with the patient, family and they are in agreement.  CODE STATUS:     Code Status Orders        Start     Ordered   09/09/14 0023  Full code   Continuous     09/09/14 0023      TOTAL TIME TAKING CARE OF THIS PATIENT: 40 minutes.    Theodoro Grist M.D on 09/09/2014 at 6:54 PM  Between 7am to 6pm - Pager - [phone]  After 6pm go to [email or web address] - password [email or web address]  [facility]  Office  [phone]  CC: Primary care physician; No primary care provider on file.

## 2014-09-09 NOTE — Progress Notes (Signed)
Patient discharged per family request, d/c paperwork discussed with pts family and pt, no questions at this time, Family is taking pt to [facility]. VS checked and are wnl for pt as charted. No ss of distress noted. Dr Eugenia Mcalpine aware.

## 2014-09-09 NOTE — Consult Note (Signed)
[facility] Face-to-Face Psychiatry Consult   Reason for Consult:  Consult for this 64 year old woman who presented with generalized weakness and atypical dysarthria. Concern about psychogenic symptoms. Referring Physician:  Ether Griffins Patient Identification: ERTHA NABOR MRN:  [id number] Principal Diagnosis: Conversion disorder Diagnosis:   Patient Active Problem List   Diagnosis Date Noted  . Generalized weakness [R53.1] 09/09/2014  . 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 female patient admitted with "I've been sick" patient is limited in her ability to give a history. See the history and physical for a clear description from the family.Marland Kitchen  HPI:  Information from the patient and the chart and the family. He looks like a good history was obtained from the family at the time of admission. I focused most of my time on speaking with the patient separately although I did speak to some family members as well. Patient evidently yesterday was in what seemed to be at her normal state of health when she acutely shouted out and then developed this generalized weakness and dysarthria. Symptoms sound like they have waxed and waned since then. Medical workup thus far has shown no known physiologic cause for her symptoms. The patient was able to tell me that she did not feel conscious of being under a great deal of stress. She did however note that she was worried about her mother who has cancer. Additionally we are coming up on the time of year that is the anniversary of the death of her son 9 years ago. There was no report however of symptoms of depression or psychosis leading up to this. Patient denies any suicidal ideation. Denies any hallucinations. She herself was not able to offer any. As to what was happening. There doesn't seem to of been any change in any medication. Patient denies any substance abuse and there is no evidence of  any substance abuse.  Past psychiatric history: States that she saw a counselor about 9 years ago after her son died. She claims she was never in a psychiatric hospital and never given any psychiatric medicine. No history of suicide attempts.  Social history: Patient is married lives with her husband and her surviving son. She works at one of the [employer]. She had a large number of family members present in the room and they appear to of been present most of the day from what I'm told.  Family history: She knows of no family history of mental illness  Substance abuse history: Denies that she uses alcohol or uses any illicit drugs denies any history of alcohol or drug abuse.  Medical history: Patient has high blood pressure. Otherwise is not aware of having any significant ongoing medical problems. HPI Elements:   Quality:  Generalized weakness with a new onset speech impediment. Waxing and waning symptoms of some confusion. Severity:  Severe enough to put her in the hospital and take her out of being able to perform any usual activities. Timing:  Very acute onset yesterday. Duration:  Ongoing now for probably a little over 24 hours. Context:  No specific known context. A couple of stresses as noted above..  Past Medical History:  Past Medical History  Diagnosis Date  . Hypertension    History reviewed. No pertinent past surgical history. Family History:  Family History  Problem Relation Age of Onset  . Stroke Neg Hx    Social History:  History  Alcohol Use No     History  Drug Use No    Social History   Social History  . Marital Status: Married    Spouse Name: N/A  . Number of Children: N/A  . Years of Education: N/A   Social History Main Topics  . Smoking status: Never Smoker   . Smokeless tobacco: None  . Alcohol Use: No  . Drug Use: No  . Sexual Activity: Not Asked   Other Topics Concern  . None   Social History Narrative   Additional Social History:                           Allergies:  No Known Allergies  Labs:  Results for orders placed or performed during the hospital encounter of 09/08/14 (from the past 48 hour(s))  CBC with Differential     Status: None   Collection Time: 09/08/14  6:33 PM  Result Value Ref Range   WBC 7.1 3.6 - 11.0 K/uL   RBC 4.65 3.80 - 5.20 MIL/uL   Hemoglobin 13.1 12.0 - 16.0 g/dL   HCT 40.0 35.0 - 47.0 %   MCV 86.0 80.0 - 100.0 fL   MCH 28.3 26.0 - 34.0 pg   MCHC 32.8 32.0 - 36.0 g/dL   RDW 13.5 11.5 - 14.5 %   Platelets 322 150 - 440 K/uL   Neutrophils Relative % 75 %   Neutro Abs 5.3 1.4 - 6.5 K/uL   Lymphocytes Relative 19 %   Lymphs Abs 1.4 1.0 - 3.6 K/uL   Monocytes Relative 5 %   Monocytes Absolute 0.4 0.2 - 0.9 K/uL   Eosinophils Relative 1 %   Eosinophils Absolute 0.0 0 - 0.7 K/uL   Basophils Relative 0 %   Basophils Absolute 0.0 0 - 0.1 K/uL  Basic metabolic panel     Status: Abnormal   Collection Time: 09/08/14  6:33 PM  Result Value Ref Range   Sodium 141 135 - 145 mmol/L   Potassium 3.4 (L) 3.5 - 5.1 mmol/L   Chloride 107 101 - 111 mmol/L   CO2 24 22 - 32 mmol/L   Glucose, Bld 104 (H) 65 - 99 mg/dL   BUN 14 6 - 20 mg/dL   Creatinine, Ser 0.88 0.44 - 1.00 mg/dL   Calcium 9.9 8.9 - 10.3 mg/dL   GFR calc non Af Amer >60 >60 mL/min   GFR calc Af Amer >60 >60 mL/min    Comment: (NOTE) The eGFR has been calculated using the CKD EPI equation. This calculation has not been validated in all clinical situations. eGFR's persistently <60 mL/min signify possible Chronic Kidney Disease.    Anion gap 10 5 - 15  Troponin I     Status: None   Collection Time: 09/08/14  6:33 PM  Result Value Ref Range   Troponin I <0.03 <0.031 ng/mL    Comment:        NO INDICATION OF MYOCARDIAL INJURY.   Fibrin derivatives D-Dimer ([facility] only)     Status: Abnormal   Collection Time: 09/08/14  7:07 PM  Result Value Ref Range   Fibrin derivatives D-dimer ([facility]) 1054 (H) 0 - 499    Comment: <>  Exclusion of Venous Thromboembolism (VTE) - OUTPATIENTS ONLY        (Emergency Department or [place])             0-499 ng/ml (FEU)  : With a low to intermediate  pretest                                        probability for VTE this test result                                        excludes the diagnosis of VTE.           > 499 ng/ml (FEU)  : VTE not excluded.  Additional work up                                   for VTE is required.   <>  Testing on Inpatients and Evaluation of Disseminated Intravascular        Coagulation ([facility])             Reference Range:   0-499 ng/ml (FEU)   Urinalysis complete, with microscopic ([facility] only)     Status: Abnormal   Collection Time: 09/08/14  7:38 PM  Result Value Ref Range   Color, Urine YELLOW (A) YELLOW   APPearance HAZY (A) CLEAR   Glucose, UA NEGATIVE NEGATIVE mg/dL   Bilirubin Urine NEGATIVE NEGATIVE   Ketones, ur NEGATIVE NEGATIVE mg/dL   Specific Gravity, Urine 1.008 1.005 - 1.030   Hgb urine dipstick 1+ (A) NEGATIVE   pH 7.0 5.0 - 8.0   Protein, ur NEGATIVE NEGATIVE mg/dL   Nitrite NEGATIVE NEGATIVE   Leukocytes, UA TRACE (A) NEGATIVE   RBC / HPF 0-5 0 - 5 RBC/hpf   WBC, UA 6-30 0 - 5 WBC/hpf   Bacteria, UA RARE (A) NONE SEEN   Squamous Epithelial / LPF 0-5 (A) NONE SEEN   Mucous PRESENT   Pregnancy, urine     Status: None   Collection Time: 09/08/14  7:38 PM  Result Value Ref Range   Preg Test, Ur NEGATIVE NEGATIVE  Troponin I     Status: None   Collection Time: 09/08/14  9:53 PM  Result Value Ref Range   Troponin I <0.03 <0.031 ng/mL    Comment:        NO INDICATION OF MYOCARDIAL INJURY.   Glucose, capillary     Status: Abnormal   Collection Time: 09/09/14  1:19 AM  Result Value Ref Range   Glucose-Capillary 102 (H) 65 - 99 mg/dL  Urine Drug Screen, Qualitative ([facility] only)     Status: None   Collection Time: 09/09/14  9:30 AM  Result Value Ref Range   Tricyclic, Ur Screen NONE DETECTED NONE DETECTED   Amphetamines,  Ur Screen NONE DETECTED NONE DETECTED   MDMA (Ecstasy)Ur Screen NONE DETECTED NONE DETECTED   Cocaine Metabolite,Ur [place] NONE DETECTED NONE DETECTED   Opiate, Ur Screen NONE DETECTED NONE DETECTED   Phencyclidine (PCP) Ur S NONE DETECTED NONE DETECTED   Cannabinoid 50 Ng, Ur [place] NONE DETECTED NONE DETECTED   Barbiturates, Ur Screen NONE DETECTED NONE DETECTED   Benzodiazepine, Ur Scrn NONE DETECTED NONE DETECTED   Methadone Scn, Ur NONE DETECTED NONE DETECTED    Comment: (NOTE) 706  Tricyclics, urine               Cutoff 1000 ng/mL 200  Amphetamines, urine  Cutoff 1000 ng/mL 300  MDMA (Ecstasy), urine           Cutoff 500 ng/mL 400  Cocaine Metabolite, urine       Cutoff 300 ng/mL 500  Opiate, urine                   Cutoff 300 ng/mL 600  Phencyclidine (PCP), urine      Cutoff 25 ng/mL 700  Cannabinoid, urine              Cutoff 50 ng/mL 800  Barbiturates, urine             Cutoff 200 ng/mL 900  Benzodiazepine, urine           Cutoff 200 ng/mL 1000 Methadone, urine                Cutoff 300 ng/mL 1100 1200 The urine drug screen provides only a preliminary, unconfirmed 1300 analytical test result and should not be used for non-medical 1400 purposes. Clinical consideration and professional judgment should 1500 be applied to any positive drug screen result due to possible 1600 interfering substances. A more specific alternate chemical method 1700 must be used in order to obtain a confirmed analytical result.  1800 Gas chromato graphy / mass spectrometry (GC/MS) is the preferred 1900 confirmatory method.   Potassium     Status: Abnormal   Collection Time: 09/09/14  1:19 PM  Result Value Ref Range   Potassium 3.2 (L) 3.5 - 5.1 mmol/L  Magnesium     Status: None   Collection Time: 09/09/14  1:19 PM  Result Value Ref Range   Magnesium 2.1 1.7 - 2.4 mg/dL  Hemoglobin A1c     Status: None   Collection Time: 09/09/14  1:19 PM  Result Value Ref Range   Hgb A1c MFr Bld 6.0 4.0 -  6.0 %    Vitals: Blood pressure 134/71, pulse 77, temperature 98.5 F (36.9 C), temperature source Oral, resp. rate 18, height '5\' 7"'  (1.702 m), weight 94.938 kg (209 lb 4.8 oz), SpO2 98 %.  Risk to Self: Is patient at risk for suicide?: No Risk to Others:   Prior Inpatient Therapy:   Prior Outpatient Therapy:    Current Facility-Administered Medications  Medication Dose Route Frequency Provider Last Rate Last Dose  . acetaminophen (TYLENOL) tablet 650 mg  650 mg Oral Q6H PRN Lytle Butte, MD       Or  . acetaminophen (TYLENOL) suppository 650 mg  650 mg Rectal Q6H PRN Lytle Butte, MD      . aspirin EC tablet 81 mg  81 mg Oral Daily Lytle Butte, MD   81 mg at 09/09/14 0911  . lisinopril (PRINIVIL,ZESTRIL) tablet 40 mg  40 mg Oral Daily Lytle Butte, MD   40 mg at 09/09/14 0911  . LORazepam (ATIVAN) tablet 0.5 mg  0.5 mg Oral Q4H PRN Lytle Butte, MD      . morphine 2 MG/ML injection 2 mg  2 mg Intravenous Q4H PRN Lytle Butte, MD      . ondansetron [facility]) tablet 4 mg  4 mg Oral Q6H PRN Lytle Butte, MD       Or  . ondansetron [facility]) injection 4 mg  4 mg Intravenous Q6H PRN Lytle Butte, MD      . oxyCODONE (Oxy IR/ROXICODONE) immediate release tablet 5 mg  5 mg Oral Q4H PRN Lytle Butte, MD      .  potassium chloride (KLOR-CON) packet 40 mEq  40 mEq Oral BID Theodoro Grist, MD   40 mEq at 09/09/14 1613  . sodium chloride 0.9 % injection 3 mL  3 mL Intravenous Q12H Lytle Butte, MD   3 mL at 09/09/14 0912    Musculoskeletal: Strength & Muscle Tone: decreased Gait & Station: unable to stand Patient leans: N/A  Psychiatric Specialty Exam: Physical Exam  Nursing note and vitals reviewed. Constitutional: She appears well-developed and well-nourished.  HENT:  Head: Normocephalic and atraumatic.  Eyes: Conjunctivae are normal. Pupils are equal, round, and reactive to light.  Neck: Normal range of motion.  Cardiovascular: Normal heart sounds.   Respiratory: Effort  normal.  GI: Soft.  Musculoskeletal: Normal range of motion.  Neurological: She is alert.  Skin: Skin is warm and dry.  Psychiatric: Her affect is blunt. Her speech is delayed and slurred. She is slowed and withdrawn. Cognition and memory are normal.  Patient presents as an acutely sick-looking woman who looks her stated age. She did her best to be cooperative but she was not able to make significant eye contact. Her speech is marked by an atypical dysarthria with a type of a stammer. Despite this she was able to remember 3 words after several minutes and was alert and oriented    Review of Systems  Constitutional: Negative.   HENT: Negative.   Eyes: Negative.   Respiratory: Negative.   Cardiovascular: Negative.   Gastrointestinal: Negative.   Musculoskeletal: Negative.   Skin: Negative.   Neurological: Negative.   Psychiatric/Behavioral: Negative for depression, suicidal ideas, hallucinations and substance abuse. The patient is not nervous/anxious and does not have insomnia.     Blood pressure 134/71, pulse 77, temperature 98.5 F (36.9 C), temperature source Oral, resp. rate 18, height '5\' 7"'  (1.702 m), weight 94.938 kg (209 lb 4.8 oz), SpO2 98 %.Body mass index is 32.77 kg/(m^2).  General Appearance: Guarded  Eye Contact::  None  Speech:  Garbled and Patient has an unusual speech presentation. She speaks with a type of stammer that varies from word for word. Doesn't really sound like a typical physiologic dysarthria.  Volume:  Decreased  Mood:  Euthymic  Affect:  Depressed, Tearful and Patient was tearful briefly during the interview but was not able to identify any cause for it.  Thought Process:  Linear  Orientation:  Full (Time, Place, and Person)  Thought Content:  Negative  Suicidal Thoughts:  No  Homicidal Thoughts:  No  Memory:  Immediate;   Fair Recent;   Fair Remote;   Fair  Judgement:  Other:  Unclear really  Insight:  Shallow  Psychomotor Activity:  Decreased   Concentration:  Fair  Recall:  [employer] of Knowledge:Fair  Language: Poor  Akathisia:  No  Handed:  Right  AIMS (if indicated):     Assets:  Desire for Improvement Housing Physical Health Social Support  ADL's:  Impaired  Cognition: Impaired,  Mild  Sleep:      Medical Decision Making: New problem, with additional work up planned, Review of Psycho-Social Stressors (1), Review or order clinical lab tests (1), Review or order medicine tests (1) and Review of Medication Regimen & Side Effects (2)  Treatment Plan Summary: Plan 65 year old woman with acute onset of nonphysiologic symptoms of generalized weakness tremor waxing and waning mental state. MRI completely negative. Workup so far completely unrevealing. Course of illness not typical for a physiologic cause. All of this obviously raises the concern about psychogenic  causes. As I told the patient and the family we really don't know everything there is to know about medicine and the brain and I cannot say that there is no physiologic illness going on. What I can say is that so far we have not found any evidence of that I'm sure the doctors here will do their best to rule that out. At the same time I can't definitively rule in enough anxiety or depression to say that this is definitely a "conversion disorder". There is no real indication for adding any psychiatric medicine. Benzodiazepine's can sometimes help but have enough risks associated with him especially falls that I don't think I would probably do that right now. Supportive counseling to the patient and the family that the symptoms will almost certainly get better gradually on their own and if they don't more evaluation can be done later. I will follow-up tomorrow if needed.  Plan:  No evidence of imminent risk to self or others at present.   Patient does not meet criteria for psychiatric inpatient admission. Supportive therapy provided about ongoing stressors. Disposition:  Watchful waiting and monitoring in the hospital. Supportive counseling.  Antha Niday 09/09/2014 5:17 PM

## 2014-09-09 NOTE — Progress Notes (Addendum)
[facility] NAME: Janet [name]    MR#:  [id number]  DATE OF BIRTH:  18-Oct-1950  SUBJECTIVE:  CHIEF COMPLAINT:   Chief Complaint  Patient presents with  . Numbness  . Anxiety   patient is 64 year old African-American female who presents to the hospital with numbness and anxiety after she was diagnosed with unknown cancer. 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 She still does not speak to me when I talk to her, although cries silently in bed, able to follow commands, although refuses to keep her upper extremities up, when asked, able to lift up of her lower extremities of the mattress. Not able to get review of system as patient is nonverbal and the is difficult to get any response from her due to her mental Status  Review of Systems  Unable to perform ROS: medical condition    VITAL SIGNS: Blood pressure 150/70, pulse 71, temperature 98.2 F (36.8 C), temperature source Oral, resp. rate 18, height 5\' 7"  (1.702 m), weight 94.938 kg (209 lb 4.8 oz), SpO2 100 %.  PHYSICAL EXAMINATION:   GENERAL:  64 y.o.-year-old patient lying in the bed with no acute distress. Crying silently. Rapid eyelid blinking, many readily opens her eyes when asked, but no verbal response, able to follow some commands. Not able to hold her upper extremities when asked straight,  able to lift up her lower extremities of the mattress.  EYES: Pupils equal, round, reactive to light and accommodation. No scleral icterus. Extraocular muscles intact.  HEENT: Head atraumatic, normocephalic. Oropharynx and nasopharynx clear.  NECK:  Supple, no jugular venous distention. No thyroid enlargement, no tenderness.  LUNGS: Normal breath sounds bilaterally, no wheezing, rales,rhonchi or crepitation. No use of accessory muscles of respiration.   CARDIOVASCULAR: S1, S2 normal. No murmurs, rubs, or gallops.  ABDOMEN: Soft, nontender, nondistended. Bowel sounds present. No organomegaly or mass.  EXTREMITIES: No pedal edema, cyanosis, or clubbing.  NEUROLOGIC: Cranial nerves II through XII grossly are intact. Muscle strength 2/5 in both upper extremities , 3/5 in both lower extremities. Sensation not able to evaluate since patient is poorly cooperative. Gait not checked.  PSYCHIATRIC: The patient is alert , but does not open her eyes, does not converse intermittently follows commands.  SKIN: No obvious rash, lesion, or ulcer.   ORDERS/RESULTS REVIEWED:   CBC  Recent Labs Lab 09/08/14 1833  WBC 7.1  HGB 13.1  HCT 40.0  PLT 322  MCV 86.0  MCH 28.3  MCHC 32.8  RDW 13.5  LYMPHSABS 1.4  MONOABS 0.4  EOSABS 0.0  BASOSABS 0.0   ------------------------------------------------------------------------------------------------------------------  Chemistries   Recent Labs Lab 09/08/14 1833  NA 141  K 3.4*  CL 107  CO2 24  GLUCOSE 104*  BUN 14  CREATININE 0.88  CALCIUM 9.9   ------------------------------------------------------------------------------------------------------------------ estimated creatinine clearance is 76.4 mL/min (by C-G formula based on Cr of 0.88). ------------------------------------------------------------------------------------------------------------------ No results for input(s): TSH, T4TOTAL, T3FREE, THYROIDAB in the last 72 hours.  Invalid input(s): FREET3  Cardiac Enzymes  Recent Labs Lab 09/08/14 1833 09/08/14 2153  TROPONINI <0.03 <0.03   ------------------------------------------------------------------------------------------------------------------ Invalid input(s): POCBNP ---------------------------------------------------------------------------------------------------------------  RADIOLOGY: Ct Head Wo Contrast  09/08/2014   CLINICAL DATA:  Numbness in finger tips, toes,  legs and lips, recently diagnosed with cancer (type not specified), hyperventilation, trouble getting words out, history hypertension  EXAM: CT HEAD WITHOUT CONTRAST  TECHNIQUE: Contiguous axial images were obtained from the base of the skull through the vertex without intravenous contrast.  COMPARISON:  None  FINDINGS: Streak artifacts at posterior fossa.  Normal ventricular morphology.  No midline shift or mass effect.  Normal appearance of brain parenchyma.  No intracranial hemorrhage, mass lesion, or acute infarction.  Visualized paranasal sinuses and mastoid air cells clear.  Bones unremarkable.  IMPRESSION: Normal exam.   Electronically Signed   By: Lavonia [name] M.D.   On: 09/08/2014 18:25   Ct Angio Chest Pe W/cm &/or Wo Cm  09/08/2014   CLINICAL DATA:  Numbness to finger tips toes legs and lips. History of hypertension. Hyperventilation and anxious.  EXAM: CT ANGIOGRAPHY CHEST WITH CONTRAST  TECHNIQUE: Multidetector CT imaging of the chest was performed using the standard protocol during bolus administration of intravenous contrast. Multiplanar CT image reconstructions and MIPs were obtained to evaluate the vascular anatomy.  CONTRAST:  11mL OMNIPAQUE IOHEXOL 300 MG/ML  SOLN  COMPARISON:  None.  FINDINGS: Mediastinum: Normal heart size. No pericardial effusion noted. The esophagus appears normal. Normal appearance of the trachea. No mediastinal or hilar adenopathy. The main pulmonary artery is patent. Left and right pulmonary arteries are also patent without saddle embolus. No lobar or segmental pulmonary artery filling defect identified.  Lungs/Pleura: No pleural effusion identified. Subsegmental atelectasis noted in the posterior lung bases. No airspace consolidation or atelectasis identified.  Upper Abdomen: The visualized liver and spleen are unremarkable. The adrenal glands are unremarkable. The visualized portions of the pancreas normal. There is a suspicious lesion arising from the upper pole of the  left kidney. This measures 1.8 cm and 35.9 Hounsfield units postcontrast. This may represent a hyperdense cyst or solid enhancing neoplasm.  Musculoskeletal: The visualized osseous structures are unremarkable. There are no aggressive lytic or sclerotic bone lesions noted.  Review of the MIP images confirms the above findings.  IMPRESSION: 1. No evidence for acute pulmonary embolus. 2. Indeterminate lesion arising from the left kidney. This could represent a hyperdense cyst or solid enhancing neoplasm. Followup imaging with renal mass protocol MRI or CT is recommended. MRI would be the study of choice. These results will be called to the ordering clinician or representative by the Radiologist Assistant, and communication documented in the PACS or zVision Dashboard.   Electronically Signed   By: Kerby Moors M.D.   On: 09/08/2014 20:51   Mr Brain Wo Contrast  09/09/2014   CLINICAL DATA:  Numbness in the fingertips, toes, legs, and lips since last night.  EXAM: MRI HEAD WITHOUT CONTRAST  TECHNIQUE: Multiplanar, multiecho pulse sequences of the brain and surrounding structures were obtained without intravenous contrast.  COMPARISON:  Head CT from yesterday  FINDINGS: Calvarium and upper cervical spine: No focal marrow signal abnormality.  Orbits: No significant findings.  Sinuses and Mastoids: Clear. Mastoid and middle ears are clear.  Brain: No acute or remote infarct, hemorrhage, hydrocephalus, or mass lesion. No evidence of large vessel occlusion.  Two punctate FLAIR hyperintense foci in the cerebral white matter are normal for this age.  IMPRESSION: Unremarkable brain MRI.  No acute infarct.   Electronically Signed   By: Monte Fantasia M.D.   On: 09/09/2014 08:23   US Venous Img Lower Bilateral  09/08/2014   CLINICAL DATA:  History of blood clots.  EXAM: BILATERAL LOWER EXTREMITY VENOUS DOPPLER ULTRASOUND  TECHNIQUE: Gray-scale sonography with graded compression, as well as color Doppler and duplex  ultrasound were performed to evaluate the lower  extremity deep venous systems from the level of the common femoral vein and including the common femoral, femoral, profunda femoral, popliteal and calf veins including the posterior tibial, peroneal and gastrocnemius veins when visible. The superficial great saphenous vein was also interrogated. Spectral Doppler was utilized to evaluate flow at rest and with distal augmentation maneuvers in the common femoral, femoral and popliteal veins.  COMPARISON:  None.  FINDINGS: RIGHT LOWER EXTREMITY  Common Femoral Vein: No evidence of thrombus. Normal compressibility, respiratory phasicity and response to augmentation.  Saphenofemoral Junction: No evidence of thrombus. Normal compressibility and flow on color Doppler imaging.  Profunda Femoral Vein: No evidence of thrombus. Normal compressibility and flow on color Doppler imaging.  Femoral Vein: No evidence of thrombus. Normal compressibility, respiratory phasicity and response to augmentation.  Popliteal Vein: No evidence of thrombus. Normal compressibility, respiratory phasicity and response to augmentation.  Calf Veins: No evidence of thrombus. Normal compressibility and flow on color Doppler imaging.  Superficial Great Saphenous Vein: No evidence of thrombus. Normal compressibility and flow on color Doppler imaging.  Venous Reflux:  None.  Other Findings:  None.  LEFT LOWER EXTREMITY  Common Femoral Vein: No evidence of thrombus. Normal compressibility, respiratory phasicity and response to augmentation.  Saphenofemoral Junction: No evidence of thrombus. Normal compressibility and flow on color Doppler imaging.  Profunda Femoral Vein: No evidence of thrombus. Normal compressibility and flow on color Doppler imaging.  Femoral Vein: No evidence of thrombus. Normal compressibility, respiratory phasicity and response to augmentation.  Popliteal Vein: No evidence of thrombus. Normal compressibility, respiratory phasicity and  response to augmentation.  Calf Veins: No evidence of thrombus. Normal compressibility and flow on color Doppler imaging.  Superficial Great Saphenous Vein: No evidence of thrombus. Normal compressibility and flow on color Doppler imaging.  Venous Reflux:  None.  Other Findings:  None.  IMPRESSION: No evidence of deep venous thrombosis.   Electronically Signed   By: Kathreen Devoid   On: 09/08/2014 23:51   Dg Chest Portable 1 View  09/08/2014   CLINICAL DATA:  Shortness of breath.  EXAM: PORTABLE CHEST - 1 VIEW  COMPARISON:  09/13/2011.  FINDINGS: Trachea is midline. Heart size normal. Lungs are clear. No pleural fluid.  IMPRESSION: No acute findings.   Electronically Signed   By: Lorin Picket M.D.   On: 09/08/2014 17:33    EKG:  Orders placed or performed during the hospital encounter of 09/08/14  . ED EKG  . ED EKG  . EKG 12-Lead  . EKG 12-Lead    ASSESSMENT AND PLAN:  Active Problems:   Generalized weakness   Conversion disorder 1. Acute adjustment reaction versus conversion disorder, getting psychiatrist involved as well as physical therapist evaluation. Possibly discharge home with home health. MRI of the brain is negative for stroke.  2. Urinary tract infection?, Given fosfomycin in the emergency room, no intervention here 3. Hypokalemia. Check potassium as well as magnesium levels today 4. Hyperglycemia. Hemoglobin A1c 5. Unknown cancer, needs supportive therapy, possibly psychologist as outpatient. Multiple family members and patient's husband are in the room , supportive therapy provided   Management plans discussed with the patient, family and they are in agreement.   DRUG ALLERGIES: No Known Allergies  CODE STATUS:     Code Status Orders        Start     Ordered   09/09/14 0023  Full code   Continuous     09/09/14 0023      TOTAL TIME TAKING  CARE OF THIS PATIENT: 45 minutes.   Family decided that they would like patient to be transferred to [facility], explained  that there is no medical reason for transfer and [facility] will refuse, suggested to be discharged to home with [facility] PT and psychiatric nurse follow up, time spent 10 minutes  Shaquinta Peruski M.D on 09/09/2014 at 12:40 PM  Between 7am to 6pm - Pager - [phone]  After 6pm go to [email or web address] - password [email or web address] [facility]  [facility]  Office  [phone]  CC: Primary care physician; No primary care provider on file.

## 2014-09-09 NOTE — Care Management (Signed)
Attempted to meet with patient but patient was getting ready for a bath and family had stepped out of the room. Patient stated that I could speak with her husband. Psych consult pending. Patient kept her eyes closed while I was asking questions and then I saw tears come out of both eyes. 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 I left me contact information with patient's sister to have him call me. RNCM will continue to follow.

## 2014-09-09 NOTE — Progress Notes (Signed)
Dr Ether Griffins called and notified of family request to transfer pt to another hospital [facility], MD wants to discharge pt 1st, family notified of MD's decision, family okay with MD discharging pt to take her to [place].

## 2014-09-09 NOTE — Evaluation (Signed)
Physical Therapy Evaluation Patient Details Name: Janet [name] MRN: [id number] DOB: Jul 28, 1950 Today's Date: 09/09/2014   History of Present Illness  Pt is a 64 y.o. female presenting to hospital (after eating with family and yelling out in pain) and c/o numbness in finger tips, toes, mouth, and also appearing anxious.  Pt non-verbal on arrival to hospital.  CT head negative.  CT Angio chest PE negative for PE.  Doppler B LE negative for DVT.  MRI of head negative for acute infarct.  Psych consult pending.  Clinical Impression  Currently pt demonstrates impairments with strength, balance, and limitations with functional mobility.  Pt had difficulty verbalizing any words during session (pt able to say 1 word intermittently with increased effort/extra time but not always clear).  Prior to admission, pt was independent without AD.  Pt lives with her husband and son in 1 level home with 1 step to enter.  Currently pt appears weak with UE and LE movements and requires assist with functional mobility but pt's LE's appeared strong in standing.  Anticipate with continued mobility pt will progress with functional mobility and be able to discharge home (HHPT and DME pending pt's progress).  Pt would benefit from skilled PT to address above noted impairments and functional limitations.      Follow Up Recommendations Home health PT (pending pt's progress)    Equipment Recommendations  Other (comment) (TBD with pt's progress)    Recommendations for Other Services       Precautions / Restrictions Precautions Precautions: Fall Restrictions Weight Bearing Restrictions: No      Mobility  Bed Mobility Overal bed mobility: Needs Assistance Bed Mobility: Supine to Sit     Supine to sit: Min assist     General bed mobility comments: increased time and effort to perform by pt  Transfers Overall transfer level: Needs assistance Equipment used: Rolling walker (2 wheeled) Transfers: Sit to/from  Stand Sit to Stand: Min guard;Min assist;+2 safety/equipment;+2 physical assistance            Ambulation/Gait Ambulation/Gait assistance: Min guard;Min assist;+2 physical assistance;+2 safety/equipment Ambulation Distance (Feet): 3 Feet (bed to chair (pt refused longer distance)) Assistive device: Rolling walker (2 wheeled)   Gait velocity: decreased   General Gait Details: pt's LE's appearing strong in standing; decreased B step length/foot clearance/heelstrike  Stairs            Wheelchair Mobility    Modified Rankin (Stroke Patients Only)       Balance Overall balance assessment: Needs assistance Sitting-balance support: Bilateral upper extremity supported;Feet supported Sitting balance-Leahy Scale: Good     Standing balance support: Bilateral upper extremity supported;During functional activity Standing balance-Leahy Scale: Fair                               Pertinent Vitals/Pain Pain Assessment: No/denies pain  Vitals stable and WFL throughout treatment session.    Home Living Family/patient expects to be discharged to:: Private residence Living Arrangements: Spouse/significant other;Children (Husband and son) Available Help at Discharge: Family Type of Home: House Home Access: Stairs to enter Entrance Stairs-Rails: None Entrance Stairs-Number of Steps: 1 Home Layout: One level Home Equipment: None      Prior Function Level of Independence: Independent               Hand Dominance        Extremity/Trunk Assessment   Upper Extremity Assessment: Generalized weakness  Lower Extremity Assessment: Generalized weakness (with extra effort, pt able to perform limited range B SLR and B heelslides; pt c/o numbness L 1-4 toes (not 5th); no c/o numbness in R toes/foot when assessed)         Communication   Communication: Other (comment) (pt with difficulty with verbal answers (pt with 1 word answers intermittently  but not always clear); pt did fairly well with nodding head yes/no with questions)  Cognition Arousal/Alertness: Awake/alert Behavior During Therapy: Flat affect Overall Cognitive Status: Difficult to assess                      General Comments   Nursing cleared pt for participation in physical therapy.  Pt agreeable to PT session.  Pt's husband and 2 sister's present during session.    Exercises        Assessment/Plan    PT Assessment Patient needs continued PT services  PT Diagnosis Difficulty walking;Generalized weakness   PT Problem List Decreased strength;Decreased activity tolerance;Decreased balance;Decreased mobility;Decreased knowledge of use of DME  PT Treatment Interventions DME instruction;Gait training;Stair training;Functional mobility training;Therapeutic activities;Therapeutic exercise;Balance training;Patient/family education   PT Goals (Current goals can be found in the Care Plan section) Acute Rehab PT Goals Patient Stated Goal: To go home PT Goal Formulation: With patient Time For Goal Achievement: 09/23/14 Potential to Achieve Goals: Fair    Frequency Min 2X/week   Barriers to discharge        Co-evaluation               End of Session Equipment Utilized During Treatment: Gait belt Activity Tolerance: Patient tolerated treatment well Patient left: in chair;with call bell/phone within reach;with chair alarm set;with family/visitor present Nurse Communication: Mobility status    Functional Assessment Tool Used: AM-PAC without stairs Functional Limitation: Mobility: Walking and moving around Mobility: Walking and Moving Around Current Status [phone]): At least 40 percent but less than 60 percent impaired, limited or restricted Mobility: Walking and Moving Around Goal Status [phone]): 0 percent impaired, limited or restricted    Time: 1100-1145 PT Time Calculation (min) (ACUTE ONLY): 45 min   Charges:   PT Evaluation $Initial PT  Evaluation Tier I: 1 Procedure     PT G Codes:   PT G-Codes **NOT FOR INPATIENT CLASS** Functional Assessment Tool Used: AM-PAC without stairs Functional Limitation: Mobility: Walking and moving around Mobility: Walking and Moving Around Current Status ([id number]): At least 40 percent but less than 60 percent impaired, limited or restricted Mobility: Walking and Moving Around Goal Status [phone]): 0 percent impaired, limited or restricted    Leitha Bleak 09/09/2014, 1:42 PM Leitha Bleak, [place]

## 2014-09-10 NOTE — Care Management (Signed)
Post discharge: notified by Dava RN that patient had order for home health psych RN. I attempted to reach patient at listed home number but unable to leave a message. Patient did not agree to home health arrangements. I spoke with patient's daughter yesterday and she knows how to reach me. The phone rang and rang and finally Ms. Amorin answered- whispering. She said, "I'm doing good". She said her husband was there with her. I offered sending a nurse out to her home and she continued to say "I'm good". I asked for confirmation and the promise that she would take care of herself- she said "okay".  I reminded her that she has my contact should she change her mind- she said "okay".

## 2014-09-12 DIAGNOSIS — R0609 Other forms of dyspnea: Secondary | ICD-10-CM | POA: Insufficient documentation

## 2014-09-12 DIAGNOSIS — R0789 Other chest pain: Secondary | ICD-10-CM | POA: Insufficient documentation

## 2014-09-15 ENCOUNTER — Ambulatory Visit
Admission: EM | Admit: 2014-09-15 | Discharge: 2014-09-15 | Disposition: A | Discharge status: Home / Self Care | Attending: Emergency Medicine | Admitting: Emergency Medicine

## 2014-09-15 DIAGNOSIS — R140 Abdominal distension (gaseous): Secondary | ICD-10-CM

## 2014-09-15 LAB — URINALYSIS COMPLETE WITH MICROSCOPIC (ARMC ONLY)
Bilirubin Urine: NEGATIVE
GLUCOSE, UA: NEGATIVE mg/dL
Hgb urine dipstick: NEGATIVE
Ketones, ur: NEGATIVE mg/dL
Leukocytes, UA: NEGATIVE
Nitrite: NEGATIVE
Protein, ur: NEGATIVE mg/dL
Specific Gravity, Urine: 1.025 (ref 1.005–1.030)
pH: 5.5 (ref 5.0–8.0)

## 2014-09-15 MED ORDER — SIMETHICONE 80 MG PO CHEW: 80.0000 mg | CHEWABLE_TABLET | Freq: Four times a day (QID) | ORAL | Status: DC | PRN

## 2014-09-15 NOTE — ED Notes (Signed)
Pt states "I am being treated for urinary track infection since last Friday, but my abdomen feels bloated, I just don't feel comfortable." Pt ambulatory and laughing. Pt states then said "let me tell you the truth, I went to ED because I had a reaction to something, maybe shrimp. I spent two days in the hospital last Wednesday and Thursday, no real diagnosis. They did find a mass on my left kidney. I followed up with my PCP and she scheduled a MRI of my kidney. I am taking the medication for a urinary tract infection."

## 2014-09-15 NOTE — Discharge Instructions (Signed)
Bloating Bloating is the feeling of fullness in your belly. You may feel as though your pants are too tight. Often the cause of bloating is overeating, retaining fluids, or having gas in your bowel. It is also caused by swallowing air and eating foods that cause gas. Irritable bowel syndrome is one of the most common causes of bloating. Constipation is also a common cause. Sometimes more serious problems can cause bloating. SYMPTOMS  Usually there is a feeling of fullness, as though your abdomen is bulged out. There may be mild discomfort.  DIAGNOSIS  Usually no particular testing is necessary for most bloating. If the condition persists and seems to become worse, your caregiver may do additional testing.  TREATMENT   There is no direct treatment for bloating.  Do not put gas into the bowel. Avoid chewing gum and sucking on candy. These tend to make you swallow air. Swallowing air can also be a nervous habit. Try to avoid this.  Avoiding high residue diets will help. Eat foods with soluble fibers (examples include root vegetables, apples, or barley) and substitute dairy products with soy and rice products. This helps irritable bowel syndrome.  If constipation is the cause, then a high residue diet with more fiber will help.  Avoid carbonated beverages.  Over-the-counter preparations are available that help reduce gas. Your pharmacist can help you with this. SEEK MEDICAL CARE IF:   Bloating continues and seems to be getting worse.  You notice a weight gain.  You have a weight loss but the bloating is getting worse.  You have changes in your bowel habits or develop nausea or vomiting. SEEK IMMEDIATE MEDICAL CARE IF:   You develop shortness of breath or swelling in your legs.  You have an increase in abdominal pain or develop chest pain. Document Released: 11/01/2005 Document Revised: 03/26/2011 Document Reviewed: 12/20/2006 Exit[employer] Patient Information 2015 [employer]. This  information is not intended to replace advice given to you by your health care provider. Make sure you discuss any questions you have with your health care provider.  

## 2014-09-15 NOTE — ED Provider Notes (Signed)
CSN: [id number]     Arrival date & time 09/15/14  1835 History   First MD Initiated Contact with Patient 09/15/14 2116     Chief Complaint  Patient presents with  . Bloated  . Urinary Tract Infection   (Consider location/radiation/quality/duration/timing/severity/associated sxs/prior Treatment) HPI  This 64 year old female who presents with bloating in her abdomen. Sates that is very uncomfortable but without any pain. He had a normal bowel movement this morning. He has been burping more than usual but is not passing gas. She recently was discharged from [facility] after being admitted following eating tainted shrimp. She denies also for 2 days and released on Friday days ago. He said everything was fine on the testing that they have performed. He did find a mass on her left kidney and she was seen by her primary care physician on Monday who referred her for an MRI and to see other specialists which are pending. She has no nausea or vomiting. She does admit that she is not taking her Lasix as prescribed and usually taking it just once a day instead of twice a day. He denies any abdominal pain nausea or vomiting. She has recently started taking Macrobid for a UTI at the time of her discharge.  Past Medical History  Diagnosis Date  . Hypertension    History reviewed. No pertinent past surgical history. Family History  Problem Relation Age of Onset  . Stroke Neg Hx    Social History  Substance Use Topics  . Smoking status: Never Smoker   . Smokeless tobacco: None  . Alcohol Use: No   OB History    No data available     Review of Systems  Constitutional: Negative for appetite change.  Skin: Positive for color change.  All other systems reviewed and are negative.   Allergies  Penicillins  Home Medications   Prior to Admission medications   Medication Sig Start Date End Date Taking? Authorizing Provider  famotidine (PEPCID) 20 MG tablet Take 20 mg by mouth 2 (two)  times daily.   Yes Historical Provider, MD  furosemide (LASIX) 20 MG tablet Take 2 tablets by mouth daily as needed. tk 2 tablets every morning as needed for fluid retention 07/06/14  Yes Historical Provider, MD  lisinopril (PRINIVIL,ZESTRIL) 40 MG tablet Take 1 tablet by mouth daily. 08/27/14  Yes Historical Provider, MD  meclizine (ANTIVERT) 25 MG tablet Take 25 mg by mouth 3 (three) times daily as needed for dizziness.   Yes Historical Provider, MD  nitrofurantoin, macrocrystal-monohydrate, (MACROBID) 100 MG capsule Take 100 mg by mouth 2 (two) times daily.   Yes Historical Provider, MD  simethicone (GAS-X) 80 MG chewable tablet Chew 1 tablet (80 mg total) by mouth every 6 (six) hours as needed for flatulence. 09/15/14   Lorin Picket, PA-C   Meds Ordered and Administered this Visit  Medications - No data to display  BP 132/72 mmHg  Pulse 72  Temp(Src) 98 F (36.7 C) (Oral)  Resp 16  Ht 5\' 6"  (1.676 m)  Wt 200 lb (90.719 kg)  BMI 32.30 kg/m2  SpO2 100% No data found.   Physical Exam  Constitutional: She is oriented to person, place, and time. She appears well-developed and well-nourished. No distress.  HENT:  Head: Normocephalic and atraumatic.  Eyes: EOM are normal. Pupils are equal, round, and reactive to light.  Pulmonary/Chest: Breath sounds normal. No respiratory distress. She has no wheezes. She has no rales.  Abdominal: Soft. Bowel sounds  are normal. She exhibits no mass. There is no tenderness. There is no rebound and no guarding.  Examination of the abdomen performed with Ivin Booty as chaperone. Patient is obese. Sounds are present and normal. There is no rebound no guarding present. He does complain of mild tenderness in the left lower quadrant to deep palpation. Percussion note is normal.  Musculoskeletal: Normal range of motion. She exhibits edema.  Neurological: She is alert and oriented to person, place, and time.  Skin: Skin is warm and dry. She is not diaphoretic.   Chest stasis changes of her lower extremities and chronic edema. Is no pitting edema present today.  Psychiatric: She has a normal mood and affect. Her behavior is normal. Judgment and thought content normal.  Nursing note and vitals reviewed.   ED Course  Procedures (including critical care time)  Labs Review Labs Reviewed  URINALYSIS COMPLETEWITH MICROSCOPIC [facility] ONLY) - Abnormal; Notable for the following:    Squamous Epithelial / LPF 0-5 (*)    All other components within normal limits    Imaging Review No results found.   Visual Acuity Review  Right Eye Distance:   Left Eye Distance:   Bilateral Distance:    Right Eye Near:   Left Eye Near:    Bilateral Near:         MDM   1. Abdominal bloating    New Prescriptions   SIMETHICONE (GAS-X) 80 MG CHEWABLE TABLET    Chew 1 tablet (80 mg total) by mouth every 6 (six) hours as needed for flatulence.   Plan: 1.Diagnosis reviewed with patient 2. rx as per orders; risks, benefits, potential side effects reviewed with patient 3. Recommend supportive treatment with take Lasix as directed by her physician. Bloating is likely caused by her Macrobid use which which is a listed side effect. 4. F/u prn if symptoms worsen or don't improve follow-up with her PCP for any problems   Lorin Picket, PA-C 09/15/14 2148

## 2014-09-15 NOTE — ED Notes (Signed)
Pt states she is only taking one lasix and she is not doing that every day.

## 2014-09-15 NOTE — ED Notes (Signed)
Writer spoke to Pharmacist at [facility] and patient is currently taking Macrobid bid x 7day. Diazapam ready for pickup. She has Pepcid, meclozine.

## 2014-09-29 DIAGNOSIS — I809 Phlebitis and thrombophlebitis of unspecified site: Secondary | ICD-10-CM | POA: Insufficient documentation

## 2014-12-24 ENCOUNTER — Emergency Department

## 2014-12-24 ENCOUNTER — Emergency Department
Admission: EM | Admit: 2014-12-24 | Discharge: 2014-12-24 | Disposition: A | Discharge status: Home / Self Care | Attending: Emergency Medicine | Admitting: Emergency Medicine

## 2014-12-24 ENCOUNTER — Encounter: Payer: Self-pay | Admitting: Radiology

## 2014-12-24 ENCOUNTER — Other Ambulatory Visit: Payer: Self-pay

## 2014-12-24 DIAGNOSIS — Z880 Allergy status to penicillin: Secondary | ICD-10-CM | POA: Diagnosis not present

## 2014-12-24 DIAGNOSIS — R0602 Shortness of breath: Secondary | ICD-10-CM | POA: Diagnosis present

## 2014-12-24 DIAGNOSIS — R2243 Localized swelling, mass and lump, lower limb, bilateral: Secondary | ICD-10-CM | POA: Insufficient documentation

## 2014-12-24 DIAGNOSIS — Z79899 Other long term (current) drug therapy: Secondary | ICD-10-CM | POA: Insufficient documentation

## 2014-12-24 DIAGNOSIS — I10 Essential (primary) hypertension: Secondary | ICD-10-CM | POA: Diagnosis not present

## 2014-12-24 DIAGNOSIS — R0789 Other chest pain: Secondary | ICD-10-CM | POA: Diagnosis not present

## 2014-12-24 HISTORY — DX: Unspecified asthma, uncomplicated: J45.909

## 2014-12-24 LAB — CBC WITH DIFFERENTIAL/PLATELET
Basophils Absolute: 0 10*3/uL (ref 0–0.1)
Basophils Relative: 0 %
EOS PCT: 4 %
Eosinophils Absolute: 0.3 10*3/uL (ref 0–0.7)
HCT: 37.3 % (ref 35.0–47.0)
HEMOGLOBIN: 12.3 g/dL (ref 12.0–16.0)
LYMPHS ABS: 2.8 10*3/uL (ref 1.0–3.6)
LYMPHS PCT: 37 %
MCH: 28.6 pg (ref 26.0–34.0)
MCHC: 33 g/dL (ref 32.0–36.0)
MCV: 86.6 fL (ref 80.0–100.0)
Monocytes Absolute: 0.5 10*3/uL (ref 0.2–0.9)
Monocytes Relative: 7 %
NEUTROS ABS: 3.8 10*3/uL (ref 1.4–6.5)
NEUTROS PCT: 52 %
Platelets: 325 10*3/uL (ref 150–440)
RBC: 4.3 MIL/uL (ref 3.80–5.20)
RDW: 14.4 % (ref 11.5–14.5)
WBC: 7.4 10*3/uL (ref 3.6–11.0)

## 2014-12-24 LAB — FIBRIN DERIVATIVES D-DIMER (ARMC ONLY): FIBRIN DERIVATIVES D-DIMER (ARMC): 535 — AB (ref 0–499)

## 2014-12-24 LAB — COMPREHENSIVE METABOLIC PANEL
ALK PHOS: 41 U/L (ref 38–126)
ALT: 13 U/L — AB (ref 14–54)
ANION GAP: 6 (ref 5–15)
AST: 17 U/L (ref 15–41)
Albumin: 3.7 g/dL (ref 3.5–5.0)
BUN: 18 mg/dL (ref 6–20)
CALCIUM: 9.4 mg/dL (ref 8.9–10.3)
CO2: 25 mmol/L (ref 22–32)
CREATININE: 0.87 mg/dL (ref 0.44–1.00)
Chloride: 108 mmol/L (ref 101–111)
Glucose, Bld: 103 mg/dL — ABNORMAL HIGH (ref 65–99)
Potassium: 3.5 mmol/L (ref 3.5–5.1)
SODIUM: 139 mmol/L (ref 135–145)
TOTAL PROTEIN: 7.9 g/dL (ref 6.5–8.1)
Total Bilirubin: 0.5 mg/dL (ref 0.3–1.2)

## 2014-12-24 LAB — TROPONIN I

## 2014-12-24 MED ORDER — IPRATROPIUM-ALBUTEROL 0.5-2.5 (3) MG/3ML IN SOLN
3.0000 mL | Freq: Once | RESPIRATORY_TRACT | Status: AC
  Administered 2014-12-24: 3 mL via RESPIRATORY_TRACT
  Filled 2014-12-24: qty 3

## 2014-12-24 MED ORDER — ALBUTEROL SULFATE HFA 108 (90 BASE) MCG/ACT IN AERS: 2.0000 | INHALATION_SPRAY | Freq: Four times a day (QID) | RESPIRATORY_TRACT | Status: DC | PRN

## 2014-12-24 MED ORDER — IOHEXOL 350 MG/ML SOLN
100.0000 mL | Freq: Once | INTRAVENOUS | Status: AC | PRN
  Administered 2014-12-24: 100 mL via INTRAVENOUS

## 2014-12-24 NOTE — ED Provider Notes (Signed)
[facility] Emergency Department Provider Note   ____________________________________________  Time seen: 2000  I have reviewed the triage vital signs and the nursing notes.   HISTORY  Chief Complaint Shortness of Breath   History limited by: Not Limited   HPI Janet [name] is a 64 y.o. 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 She states it started last night. It has been gradually getting worse since then. She states it is all the time. She has had some associated chest tightness. She denies any cough. She states that it reminds her slightly of the time that she had asthma many decades ago. She states that she has finished her treatment for the DVTs and no longer on any anticoagulants. She has noticed some mild bilateral leg swelling. She denies any recent fevers.  Past Medical History  Diagnosis Date  . Hypertension     Patient Active Problem List   Diagnosis Date Noted  . Generalized weakness 09/09/2014  . 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  Current Outpatient Rx  Name  Route  Sig  Dispense  Refill  . famotidine (PEPCID) 20 MG tablet   Oral   Take 20 mg by mouth 2 (two) times daily.         . furosemide (LASIX) 20 MG tablet   Oral   Take 2 tablets by mouth daily as needed. tk 2 tablets every morning as needed for fluid retention      0   . lisinopril (PRINIVIL,ZESTRIL) 40 MG tablet   Oral   Take 1 tablet by mouth daily.      0   . meclizine (ANTIVERT) 25 MG tablet   Oral   Take 25 mg by mouth 3 (three) times daily as needed for dizziness.         . nitrofurantoin, macrocrystal-monohydrate, (MACROBID) 100 MG capsule   Oral   Take 100 mg by mouth 2 (two) times daily.         . simethicone (GAS-X) 80 MG chewable tablet   Oral   Chew 1 tablet (80 mg total) by mouth every 6 (six) hours as needed for flatulence.   30 tablet   0      Allergies Penicillins  Family History  Problem Relation Age of Onset  . Stroke Neg Hx     Social History Social History  Substance Use Topics  . Smoking status: Never Smoker   . Smokeless tobacco: Not on file  . Alcohol Use: No    Review of Systems  Constitutional: Negative for fever. Cardiovascular: Negative for chest pain. Respiratory: Positive for shortness of breath Gastrointestinal: Negative for abdominal pain, vomiting and diarrhea. Genitourinary: Negative for dysuria. Musculoskeletal: Negative for back pain. Skin: Negative for rash. Neurological: Negative for headaches, focal weakness or numbness.  10-point ROS otherwise negative.  ____________________________________________   PHYSICAL EXAM:  VITAL SIGNS: ED Triage Vitals  Enc Vitals Group     BP 12/24/14 1916 164/73 mmHg     Pulse Rate 12/24/14 1916 73     Resp 12/24/14 1916 20     Temp 12/24/14 1916 98.2 F (36.8 C)     Temp Source 12/24/14 1916 Oral     SpO2 12/24/14 1916 100 %     Weight 12/24/14 1916 206 lb (93.441 kg)     Height 12/24/14 1916 5\' 9"  (1.753 m)   Constitutional: Alert and oriented. Well appearing  and in no distress. Eyes: Conjunctivae are normal. PERRL. Normal extraocular movements. ENT   Head: Normocephalic and atraumatic.   Nose: No congestion/rhinnorhea.   Mouth/Throat: Mucous membranes are moist.   Neck: No stridor. Hematological/Lymphatic/Immunilogical: No cervical lymphadenopathy. Cardiovascular: Normal rate, regular rhythm.  No murmurs, rubs, or gallops. Respiratory: Normal respiratory effort without tachypnea nor retractions. Breath sounds are clear and equal bilaterally. No wheezes/rales/rhonchi. Gastrointestinal: Soft and nontender. No distention. There is no CVA tenderness. Genitourinary: Deferred Musculoskeletal: Normal range of motion in all extremities. No joint effusions.  Trace bilateral edema Neurologic:  Normal speech and language. No gross  focal neurologic deficits are appreciated.  Skin:  Skin is warm, dry and intact. No rash noted. Psychiatric: Mood and affect are normal. Speech and behavior are normal. Patient exhibits appropriate insight and judgment.  ____________________________________________    LABS (pertinent positives/negatives)  Labs Reviewed  COMPREHENSIVE METABOLIC PANEL - Abnormal; Notable for the following:    Glucose, Bld 103 (*)    ALT 13 (*)    All other components within normal limits  FIBRIN DERIVATIVES D-DIMER ([facility] ONLY) - Abnormal; Notable for the following:    Fibrin derivatives D-dimer ([facility]) 535 (*)    All other components within normal limits  TROPONIN I  CBC WITH DIFFERENTIAL/PLATELET     ____________________________________________   EKG  I, [name], attending physician, personally viewed and interpreted this EKG  EKG Time: 1924 Rate: 67 Rhythm: NSR Axis: normal Intervals: qtc 371 QRS: narrow ST changes: no st elevation Impression: normal ekg ____________________________________________    RADIOLOGY  CXR  IMPRESSION: No active cardiopulmonary disease.  CTA PE IMPRESSION: 1. No pulmonary embolus is noted. 2. There is 1.5 cm low-density nodule left lobe of thyroid gland. Further correlation with thyroid gland ultrasound is recommended. 3. No acute infiltrate or pulmonary edema. 4. No mediastinal hematoma or adenopathy. 5. Mild degenerative changes thoracic spine. ____________________________________________   PROCEDURES  Procedure(s) performed: None  Critical Care performed: No  ____________________________________________   INITIAL IMPRESSION / ASSESSMENT AND PLAN / ED COURSE  Pertinent labs & imaging results that were available during my care of the patient were reviewed by me and considered in my medical decision making (see chart for details).  Patient presented to the emergency department today because of concerns for shortness of breath.  Patient does have a history of DVTs however is no longer on anticoagulation. Given that she has had multiple recent CTA studies I was hoping to simply check a d-dimer however this was elevated. Thus a CT angiogram was done. This was negative for any PE. Patient did state she felt somewhat better after a breathing treatment. Think at this point likely perhaps a reactive airway disease type symptom. Will discharge with albuterol inhaler.   ____________________________________________   FINAL CLINICAL IMPRESSION(S) / ED DIAGNOSES  Final diagnoses:  Shortness of breath     [name], MD 12/25/14 1217

## 2014-12-24 NOTE — ED Notes (Signed)
Pt states shob that began today, pt able to speak in full sentences. Pt denies pain. Pt with history of dvt one month pta.

## 2014-12-24 NOTE — ED Notes (Signed)
Pt to xray, report to shannin, rn.

## 2014-12-24 NOTE — Discharge Instructions (Signed)
Please seek medical attention for any high fevers, chest pain, shortness of breath, change in behavior, persistent vomiting, bloody stool or any other new or concerning symptoms. ° ° °Shortness of Breath °Shortness of breath means you have trouble breathing. It could also mean that you have a medical problem. You should get immediate medical care for shortness of breath. °CAUSES  °· Not enough oxygen in the air such as with high altitudes or a smoke-filled room. °· Certain lung diseases, infections, or problems. °· Heart disease or conditions, such as angina or heart failure. °· Low red blood cells (anemia). °· Poor physical fitness, which can cause shortness of breath when you exercise. °· Chest or back injuries or stiffness. °· Being overweight. °· Smoking. °· Anxiety, which can make you feel like you are not getting enough air. °DIAGNOSIS  °Serious medical problems can often be found during your physical exam. Tests may also be done to determine why you are having shortness of breath. Tests may include: °· Chest X-rays. °· Lung function tests. °· Blood tests. °· An electrocardiogram (ECG). °· An ambulatory electrocardiogram. An ambulatory ECG records your heartbeat patterns over a 24-hour period. °· Exercise testing. °· A transthoracic echocardiogram (TTE). During echocardiography, sound waves are used to evaluate how blood flows through your heart. °· A transesophageal echocardiogram (TEE). °· Imaging scans. °Your health care provider may not be able to find a cause for your shortness of breath after your exam. In this case, it is important to have a follow-up exam with your health care provider as directed.  °TREATMENT  °Treatment for shortness of breath depends on the cause of your symptoms and can vary greatly. °HOME CARE INSTRUCTIONS  °· Do not smoke. Smoking is a common cause of shortness of breath. If you smoke, ask for help to quit. °· Avoid being around chemicals or things that may bother your breathing,  such as paint fumes and dust. °· Rest as needed. Slowly resume your usual activities. °· If medicines were prescribed, take them as directed for the full length of time directed. This includes oxygen and any inhaled medicines. °· Keep all follow-up appointments as directed by your health care provider. °SEEK MEDICAL CARE IF:  °· Your condition does not improve in the time expected. °· You have a hard time doing your normal activities even with rest. °· You have any new symptoms. °SEEK IMMEDIATE MEDICAL CARE IF:  °· Your shortness of breath gets worse. °· You feel light-headed, faint, or develop a cough not controlled with medicines. °· You start coughing up blood. °· You have pain with breathing. °· You have chest pain or pain in your arms, shoulders, or abdomen. °· You have a fever. °· You are unable to walk up stairs or exercise the way you normally do. °MAKE SURE YOU: °· Understand these instructions. °· Will watch your condition. °· Will get help right away if you are not doing well or get worse. °  °This information is not intended to replace advice given to you by your health care provider. Make sure you discuss any questions you have with your health care provider. °  °Document Released: 09/26/2000 Document Revised: 01/06/2013 Document Reviewed: 03/19/2011 °Elsevier Interactive Patient Education ©2016 [employer] ° °

## 2015-02-24 DIAGNOSIS — N2889 Other specified disorders of kidney and ureter: Secondary | ICD-10-CM | POA: Insufficient documentation

## 2015-04-13 ENCOUNTER — Other Ambulatory Visit: Payer: Self-pay | Admitting: Family Medicine

## 2015-04-13 DIAGNOSIS — Z1231 Encounter for screening mammogram for malignant neoplasm of breast: Secondary | ICD-10-CM

## 2015-04-13 DIAGNOSIS — Z13820 Encounter for screening for osteoporosis: Secondary | ICD-10-CM

## 2015-04-23 ENCOUNTER — Emergency Department: Payer: Medicare Other

## 2015-04-23 ENCOUNTER — Emergency Department
Admission: EM | Admit: 2015-04-23 | Discharge: 2015-04-23 | Disposition: A | Discharge status: Home / Self Care | Payer: Medicare Other | Attending: Emergency Medicine | Admitting: Emergency Medicine

## 2015-04-23 ENCOUNTER — Encounter: Payer: Self-pay | Admitting: Emergency Medicine

## 2015-04-23 DIAGNOSIS — I10 Essential (primary) hypertension: Secondary | ICD-10-CM | POA: Diagnosis not present

## 2015-04-23 DIAGNOSIS — J45909 Unspecified asthma, uncomplicated: Secondary | ICD-10-CM | POA: Diagnosis not present

## 2015-04-23 DIAGNOSIS — R0789 Other chest pain: Secondary | ICD-10-CM

## 2015-04-23 DIAGNOSIS — Z91013 Allergy to seafood: Secondary | ICD-10-CM | POA: Diagnosis not present

## 2015-04-23 DIAGNOSIS — Z7982 Long term (current) use of aspirin: Secondary | ICD-10-CM | POA: Diagnosis not present

## 2015-04-23 DIAGNOSIS — R079 Chest pain, unspecified: Secondary | ICD-10-CM

## 2015-04-23 HISTORY — DX: Malignant (primary) neoplasm, unspecified: C80.1

## 2015-04-23 LAB — APTT: APTT: 33 s (ref 24–36)

## 2015-04-23 LAB — BASIC METABOLIC PANEL
ANION GAP: 5 (ref 5–15)
BUN: 12 mg/dL (ref 6–20)
CHLORIDE: 109 mmol/L (ref 101–111)
CO2: 24 mmol/L (ref 22–32)
Calcium: 9.5 mg/dL (ref 8.9–10.3)
Creatinine, Ser: 0.8 mg/dL (ref 0.44–1.00)
Glucose, Bld: 88 mg/dL (ref 65–99)
POTASSIUM: 3.2 mmol/L — AB (ref 3.5–5.1)
SODIUM: 138 mmol/L (ref 135–145)

## 2015-04-23 LAB — CBC
HEMATOCRIT: 40 % (ref 35.0–47.0)
HEMOGLOBIN: 13.1 g/dL (ref 12.0–16.0)
MCH: 28 pg (ref 26.0–34.0)
MCHC: 32.9 g/dL (ref 32.0–36.0)
MCV: 85.1 fL (ref 80.0–100.0)
Platelets: 326 10*3/uL (ref 150–440)
RBC: 4.7 MIL/uL (ref 3.80–5.20)
RDW: 14.1 % (ref 11.5–14.5)
WBC: 6.6 10*3/uL (ref 3.6–11.0)

## 2015-04-23 LAB — PROTIME-INR
INR: 1.08
Prothrombin Time: 14.2 seconds (ref 11.4–15.0)

## 2015-04-23 LAB — TROPONIN I: Troponin I: <0.03 ng/mL (ref <0.031)

## 2015-04-23 MED ORDER — IOPAMIDOL (ISOVUE-370) INJECTION 76%
75.0000 mL | Freq: Once | INTRAVENOUS | Status: AC | PRN
  Administered 2015-04-23: 75 mL via INTRAVENOUS

## 2015-04-23 NOTE — ED Notes (Signed)
Pt discharged to home.  Family member driving.  Discharge instructions reviewed.  Verbalized understanding.  No questions or concerns at this time.  Teach back verified.  Pt in NAD.  No items left in ED.   

## 2015-04-23 NOTE — ED Provider Notes (Signed)
[facility] Emergency Department Provider Note  ____________________________________________  Time seen: Approximately 5:13 PM  I have reviewed the triage vital signs and the nursing notes.   HISTORY  Chief Complaint Chest Pain    HPI Janet [name] is a 65 y.o. female who reports history of hypertension, asthma, DVT treated with Xarelto now off anticoagulation, and possibly a pulmonary embolism.  Patient reports that a few months ago she developed chest pain, she was seen in the emergency room in no directwas clearly given. She then followed up with her doctor and they diagnosed her with a blood clot in the leg, possible blood clot in the lung, and started on Xarelto which she continued until it was discontinued by her physician a couple months ago.  She denies fevers chills cough or trouble breathing at this time. She does report she has chronic swelling in both lower legs and very bad veins. She currently follows the vascular surgery at [facility].  Today she started developing chest discomfort about one hour prior to arrival, she described as a slight tightness over the left chest that did not radiate. No associated nausea vomiting or shortness of breath. She took 2 aspirin and Aleve and her pain is currently gone.    Past Medical History  Diagnosis Date  . Hypertension   . Asthma   . Cancer [facility])     Patient Active Problem List   Diagnosis Date Noted  . Generalized weakness 09/09/2014  . 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 No pertinent past surgical history.  Current Outpatient Rx  Name  Route  Sig  Dispense  Refill  . aspirin EC 81 MG tablet   Oral   Take 81 mg by mouth daily.         Marland Kitchen lisinopril (PRINIVIL,ZESTRIL) 40 MG tablet   Oral   Take 1 tablet by mouth daily.      0   . potassium chloride SA (K-DUR,KLOR-CON) 20 MEQ tablet   Oral   Take 20 mEq by mouth 2 (two) times daily.         Marland Kitchen  albuterol (PROVENTIL HFA;VENTOLIN HFA) 108 (90 BASE) MCG/ACT inhaler   Inhalation   Inhale 2 puffs into the lungs every 6 (six) hours as needed for wheezing or shortness of breath.   1 Inhaler   0     Allergies Shellfish allergy and Penicillins  Family History  Problem Relation Age of Onset  . Stroke Neg Hx     Social History Social History  Substance Use Topics  . Smoking status: Never Smoker   . Smokeless tobacco: None  . Alcohol Use: No    Review of Systems Constitutional: No fever/chills Eyes: No visual changes. ENT: No sore throat. Cardiovascular: See history of present illness Respiratory: Denies shortness of breath. Gastrointestinal: No abdominal pain.  No nausea, no vomiting.  No diarrhea.  No constipation. Genitourinary: Negative for dysuria. Musculoskeletal: Negative for back pain. Skin: Negative for rash. Neurological: Negative for headaches, focal weakness or numbness.  10-point ROS otherwise negative.  ____________________________________________   PHYSICAL EXAM:  VITAL SIGNS: ED Triage Vitals  Enc Vitals Group     BP 04/23/15 1421 156/74 mmHg     Pulse Rate 04/23/15 1421 70     Resp 04/23/15 1421 18     Temp 04/23/15 1421 98.9 F (37.2 C)     Temp Source 04/23/15 1421 Oral     SpO2 04/23/15 1421 100 %  Weight 04/23/15 1421 214 lb (97.07 kg)     Height 04/23/15 1421 5\' 9"  (1.753 m)     Head Cir --      Peak Flow --      Pain Score 04/23/15 1421 5     Pain Loc --      Pain Edu? --      Excl. in [place]? --    Constitutional: Alert and oriented. Well appearing and in no acute distress. Eyes: Conjunctivae are normal. PERRL. EOMI. Head: Atraumatic. Nose: No congestion/rhinnorhea. Mouth/Throat: Mucous membranes are moist.  Oropharynx non-erythematous. Neck: No stridor.   Cardiovascular: Normal rate, regular rhythm. Grossly normal heart sounds.  Good peripheral circulation. Respiratory: Normal respiratory effort.  No retractions. Lungs  CTAB. Gastrointestinal: Soft and nontender.  Musculoskeletal: No lower extremity tenderness She does have 2+ lower extremity edema with significant venous stasis changes and somewhat firm left calf which she reports is chronic. Dorsalis pedis and posterior tibial pulses are normal and capillary refill normal in both legs, no evidence of ischemia or phlegmasia..  No joint effusions. Neurologic:  Normal speech and language. No gross focal neurologic deficits are appreciated. Skin:  Skin is warm, dry and intact. No rash noted. Psychiatric: Mood and affect are normal. Speech and behavior are normal.  ____________________________________________   LABS (all labs ordered are listed, but only abnormal results are displayed)  Labs Reviewed  BASIC METABOLIC PANEL - Abnormal; Notable for the following:    Potassium 3.2 (*)    All other components within normal limits  CBC  TROPONIN I  PROTIME-INR  APTT  TROPONIN I   ____________________________________________  EKG  ED ECG REPORT I, QUALE, MARK, the attending physician, personally viewed and interpreted this ECG.  Date: 04/23/2015 EKG Time: 1425 Rate: 68 Rhythm: normal sinus rhythm QRS Axis: normal Intervals: normal ST/T Wave abnormalities: normal Conduction Disturbances: none Narrative Interpretation: unremarkable  ____________________________________________  RADIOLOGY  No pulmonary embolus or acute intrathoracic process. 2. There are 2 incidental findings that require further evaluation. Heterogeneously enlarged left lobe of the thyroid gland with nodularity. Recommend evaluation with thyroid ultrasound, in a nonemergent setting. Indeterminate left renal lesion measures 1.8 cm, hyperdense cyst versus solid mass. This needs further characterization with renal mass protocol MRI or CT, MRI would again be the preferred study of choice. These findings were present and recommendations made previously, however documented follow-up  not seen in the patient's imaging time line. These results will be called to the ordering clinician or representative by the Radiologist Assistant, and communication documented in the PACS or zVision Dashboard. ____________________________________________   PROCEDURES  Procedure(s) performed: None  Critical Care performed: No  ____________________________________________   INITIAL IMPRESSION / ASSESSMENT AND PLAN / ED COURSE  Pertinent labs & imaging results that were available during my care of the patient were reviewed by me and considered in my medical decision making (see chart for details). Brief chest pain now resolved. EKG normal and first troponin normal. Given the patient's history of her, feel that evaluation for pulmonary emboli some is also warranted. No signs or symptoms such as a ripping tearing or moving pain such as dissection. Lungs clear, no recent or infectious symptoms. Overall very reassuring that her pain and symptoms have resolved at this time.  Patient's troponin negative 2. At the present time, 9:15 PM she denies any recurrence of her chest pain. EKG normal. At this point appears low risk that this would be an acute coronary event, but did discuss with the patient  and she'll follow up closely with Dr. Clayborn Bigness and cardiology for follow-up this week. Careful chest pain return precautions discussed the patient and her husband.  We also discussed incidental findings including thyroid nodule, recommend follow-up, and also left renal cyst. The patient reports she is aware of the left kidney lesion and actually follows with [facility] already regarding this but will assure follow-up.  Return precautions and treatment recommendations and follow-up discussed with the patient who is agreeable with the plan.  ____________________________________________   FINAL CLINICAL IMPRESSION(S) / ED DIAGNOSES  Final diagnoses:  Chest pain      Delman Kitten, MD 04/23/15 2140

## 2015-04-23 NOTE — ED Notes (Signed)
Pt to ed with c/o chest pain that started this am after she awoke,  Pt reports sob and weakness and dizziness with the pain.  Reports pain was 9/10, so she took advil 1 tab and now rates pain 4/10.

## 2015-04-23 NOTE — ED Notes (Signed)
CT notified patient has IV access. 

## 2015-04-23 NOTE — ED Notes (Signed)
Pt resting comfortably at this time.  Pt has no questions or requests at this time.

## 2015-04-23 NOTE — Discharge Instructions (Signed)
You have been seen in the Emergency Department (ED) today for chest pain.  As we have discussed todays test results are normal, but you may require further testing.  Please follow up with the recommended doctor as instructed above in these documents regarding todays emergent visit and your recent symptoms to discuss further management.  Continue to take your regular medications. If you are not doing so already, please also take a daily baby aspirin (81 mg), at least until you follow up with your doctor.  Return to the Emergency Department (ED) if you experience any further chest pain/pressure/tightness, difficulty breathing, or sudden sweating, or other symptoms that concern you.  Also, please make sure to notify her doctor and get follow-up regarding the nodule seen in your thyroid as well as on your left kidney today.  There are 2 incidental findings that require further evaluation. Heterogeneously enlarged left lobe of the thyroid gland with nodularity. Recommend evaluation with thyroid ultrasound, in a nonemergent setting. Indeterminate left renal lesion measures 1.8 cm, hyperdense cyst versus solid mass. This needs further characterization with renal mass protocol MRI or CT, MRI would again be the preferred study of choice.   Chest Pain Observation It is often hard to give a specific diagnosis for the cause of chest pain. Among other possibilities your symptoms might be caused by inadequate oxygen delivery to your heart (angina). Angina that is not treated or evaluated can lead to a heart attack (myocardial infarction) or death. Blood tests, electrocardiograms, and X-rays may have been done to help determine a possible cause of your chest pain. After evaluation and observation, your health care provider has determined that it is unlikely your pain was caused by an unstable condition that requires hospitalization. However, a full evaluation of your pain may need to be completed, with  additional diagnostic testing as directed. It is very important to keep your follow-up appointments. Not keeping your follow-up appointments could result in permanent heart damage, disability, or death. If there is any problem keeping your follow-up appointments, you must call your health care provider. HOME CARE INSTRUCTIONS  Due to the slight chance that your pain could be angina, it is important to follow your health care provider's treatment plan and also maintain a healthy lifestyle:  Maintain or work toward achieving a healthy weight.  Stay physically active and exercise regularly.  Decrease your salt intake.  Eat a balanced, healthy diet. Talk to a dietitian to learn about heart-healthy foods.  Increase your fiber intake by including whole grains, vegetables, fruits, and nuts in your diet.  Avoid situations that cause stress, anger, or depression.  Take medicines as advised by your health care provider. Report any side effects to your health care provider. Do not stop medicines or adjust the dosages on your own.  Quit smoking. Do not use nicotine patches or gum until you check with your health care provider.  Keep your blood pressure, blood sugar, and cholesterol levels within normal limits.  Limit alcohol intake to no more than 1 drink per day for women who are not pregnant and 2 drinks per day for men.  Do not abuse drugs. SEEK IMMEDIATE MEDICAL CARE IF: You have severe chest pain or pressure which may include symptoms such as:  You feel pain or pressure in your arms, neck, jaw, or back.  You have severe back or abdominal pain, feel sick to your stomach (nauseous), or throw up (vomit).  You are sweating profusely.  You are having a fast or  irregular heartbeat.  You feel short of breath while at rest.  You notice increasing shortness of breath during rest, sleep, or with activity.  You have chest pain that does not get better after rest or after taking your usual  medicine.  You wake from sleep with chest pain.  You are unable to sleep because you cannot breathe.  You develop a frequent cough or you are coughing up blood.  You feel dizzy, faint, or experience extreme fatigue.  You develop severe weakness, dizziness, fainting, or chills. Any of these symptoms may represent a serious problem that is an emergency. Do not wait to see if the symptoms will go away. Call your local emergency services (911 in the [place] Do not drive yourself to the hospital. MAKE SURE YOU:  Understand these instructions.  Will watch your condition.  Will get help right away if you are not doing well or get worse.   This information is not intended to replace advice given to you by your health care provider. Make sure you discuss any questions you have with your health care provider.   Document Released: 02/03/2010 Document Revised: 01/06/2013 Document Reviewed: 07/03/2012 Elsevier Interactive Patient Education [employer].

## 2015-08-10 ENCOUNTER — Emergency Department: Payer: Medicare Other

## 2015-08-10 ENCOUNTER — Encounter: Payer: Self-pay | Admitting: Emergency Medicine

## 2015-08-10 DIAGNOSIS — R600 Localized edema: Secondary | ICD-10-CM | POA: Insufficient documentation

## 2015-08-10 DIAGNOSIS — J45909 Unspecified asthma, uncomplicated: Secondary | ICD-10-CM | POA: Diagnosis not present

## 2015-08-10 DIAGNOSIS — R531 Weakness: Secondary | ICD-10-CM | POA: Diagnosis present

## 2015-08-10 DIAGNOSIS — Z7982 Long term (current) use of aspirin: Secondary | ICD-10-CM | POA: Diagnosis not present

## 2015-08-10 DIAGNOSIS — Z859 Personal history of malignant neoplasm, unspecified: Secondary | ICD-10-CM | POA: Diagnosis not present

## 2015-08-10 DIAGNOSIS — I10 Essential (primary) hypertension: Secondary | ICD-10-CM | POA: Diagnosis not present

## 2015-08-10 LAB — BASIC METABOLIC PANEL
Anion gap: 11 (ref 5–15)
BUN: 19 mg/dL (ref 6–20)
CALCIUM: 9.9 mg/dL (ref 8.9–10.3)
CO2: 25 mmol/L (ref 22–32)
CREATININE: 0.84 mg/dL (ref 0.44–1.00)
Chloride: 104 mmol/L (ref 101–111)
GFR calc Af Amer: >60 mL/min (ref >60)
Glucose, Bld: 95 mg/dL (ref 65–99)
POTASSIUM: 3.9 mmol/L (ref 3.5–5.1)
SODIUM: 140 mmol/L (ref 135–145)

## 2015-08-10 LAB — URINALYSIS COMPLETE WITH MICROSCOPIC (ARMC ONLY)
BILIRUBIN URINE: NEGATIVE
GLUCOSE, UA: NEGATIVE mg/dL
KETONES UR: NEGATIVE mg/dL
LEUKOCYTES UA: NEGATIVE
NITRITE: NEGATIVE
Protein, ur: NEGATIVE mg/dL
SPECIFIC GRAVITY, URINE: 1.018 (ref 1.005–1.030)
pH: 5 (ref 5.0–8.0)

## 2015-08-10 LAB — CBC
HEMATOCRIT: 40 % (ref 35.0–47.0)
Hemoglobin: 13 g/dL (ref 12.0–16.0)
MCH: 28.3 pg (ref 26.0–34.0)
MCHC: 32.4 g/dL (ref 32.0–36.0)
MCV: 87.3 fL (ref 80.0–100.0)
PLATELETS: 317 10*3/uL (ref 150–440)
RBC: 4.59 MIL/uL (ref 3.80–5.20)
RDW: 13.7 % (ref 11.5–14.5)
WBC: 7.7 10*3/uL (ref 3.6–11.0)

## 2015-08-10 NOTE — ED Notes (Signed)
Pt to u/s via w/c accomp by u/s tech 

## 2015-08-10 NOTE — ED Triage Notes (Addendum)
Pt complains of weakness since Monday. Pt denies pain, recent cold, urinary symptoms or Vomiting/Diarrhea. Pt does state she feels a little nausea. Pt does complains of tenderness and pain to lower left leg for one week. Pt states she was diagnosed with a DVT to left lower leg 5 months ago that moved up her leg.

## 2015-08-11 ENCOUNTER — Emergency Department
Admission: EM | Admit: 2015-08-11 | Discharge: 2015-08-11 | Disposition: A | Discharge status: Home / Self Care | Payer: Medicare Other | Attending: Emergency Medicine | Admitting: Emergency Medicine

## 2015-08-11 DIAGNOSIS — R600 Localized edema: Secondary | ICD-10-CM

## 2015-08-11 DIAGNOSIS — R531 Weakness: Secondary | ICD-10-CM

## 2015-08-11 LAB — TROPONIN I

## 2015-08-11 MED ORDER — HYDROCHLOROTHIAZIDE 25 MG PO TABS
25.0000 mg | ORAL_TABLET | Freq: Every day | ORAL | 0 refills | Status: DC
Start: 2015-08-11 — End: 2015-10-03

## 2015-08-11 NOTE — Discharge Instructions (Signed)
1. Take fluid pill daily 3 days (HCTZ 25 mg). Elevate legs as often as possible. 2. Return to the ER for worsening symptoms, persistent vomiting, difficulty breathing or other concerns.

## 2015-08-11 NOTE — ED Provider Notes (Signed)
[facility] Emergency Department Provider Note   ____________________________________________   First MD Initiated Contact with Patient 08/11/15 [phone]     (approximate)  I have reviewed the triage vital signs and the nursing notes.   HISTORY  Chief Complaint Weakness    HPI Janet [name] is a 65 y.o. female who presents to the ED from home with a chief complaint of generalized weakness. Patient complains of generalized weakness starting during work 2 days ago. States she stands for long hours working at a [employer]. Symptoms associated with nausea only. Denies associated fever, chills, chest pain, shortness of breath, abdominal pain, vomiting, diarrhea. Denies recent travel or trauma. Patient does complain of bilateral lower extremity edema and pain to her left calf. States she was diagnosed with a DVT 5 months ago; not currently on anticoagulants. Reports nothing makes her symptoms better or worse.   Past Medical History:  Diagnosis Date  . Asthma   . Cancer ([place])   . Hypertension     Patient Active Problem List   Diagnosis Date Noted  . Generalized weakness 09/09/2014  . 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 ABDOMINAL HYSTERECTOMY      Prior to Admission medications   Medication Sig Start Date End Date Taking? Authorizing Provider  albuterol (PROVENTIL HFA;VENTOLIN HFA) 108 (90 BASE) MCG/ACT inhaler Inhale 2 puffs into the lungs every 6 (six) hours as needed for wheezing or shortness of breath. 12/24/14   [name], MD  aspirin EC 81 MG tablet Take 81 mg by mouth daily.    Historical Provider, MD  hydrochlorothiazide (HYDRODIURIL) 25 MG tablet Take 1 tablet (25 mg total) by mouth daily. 08/11/15   [name], MD  lisinopril (PRINIVIL,ZESTRIL) 40 MG tablet Take 1 tablet by mouth daily. 08/27/14   Historical Provider, MD  potassium chloride SA (K-DUR,KLOR-CON) 20 MEQ tablet Take 20 mEq  by mouth 2 (two) times daily.    Historical Provider, MD    Allergies Shellfish allergy and Penicillins  Family History  Problem Relation Age of Onset  . Stroke Neg Hx     Social History Social History  Substance Use Topics  . Smoking status: Never Smoker  . Smokeless tobacco: Never Used  . Alcohol use No    Review of Systems  Constitutional: No fever/chills. Eyes: No visual changes. ENT: No sore throat. Cardiovascular: Denies chest pain. Respiratory: Denies shortness of breath. Gastrointestinal: No abdominal pain.  Positive for nausea, no vomiting.  No diarrhea.  No constipation. Genitourinary: Negative for dysuria. Musculoskeletal: Positive for pedal edema and left lower leg pain. Negative for back pain. Skin: Negative for rash. Neurological: Negative for headaches, focal weakness or numbness.  10-point ROS otherwise negative.  ____________________________________________   PHYSICAL EXAM:  VITAL SIGNS: ED Triage Vitals  Enc Vitals Group     BP 08/10/15 2122 (!) 148/84     Pulse Rate 08/10/15 2122 68     Resp 08/10/15 2122 18     Temp 08/10/15 2122 98.2 F (36.8 C)     Temp Source 08/10/15 2122 Oral     SpO2 08/10/15 2122 99 %     Weight 08/10/15 2122 215 lb (97.5 kg)     Height 08/10/15 2122 5\' 9"  (1.753 m)     Head Circumference --      Peak Flow --      Pain Score 08/11/15 0046 4     Pain Loc --  Pain Edu? --      Excl. in [place]? --     Constitutional: Alert and oriented. Well appearing and in no acute distress. Eyes: Conjunctivae are normal. PERRL. EOMI. Head: Atraumatic. Nose: No congestion/rhinnorhea. Mouth/Throat: Mucous membranes are moist.  Oropharynx non-erythematous. Neck: No stridor.   Cardiovascular: Normal rate, regular rhythm. Grossly normal heart sounds.  Good peripheral circulation. Respiratory: Normal respiratory effort.  No retractions. Lungs CTAB. Gastrointestinal: Soft and nontender. No distention. No abdominal bruits. No CVA  tenderness. Musculoskeletal: BLE 1+ pitting edema.  Left calf tender to palpation but still remained supple without evidence for compartment syndrome. 2+ femoral and distal pulses. Symmetrically warm limbs without evidence for ischemia. No joint effusions. Neurologic:  Normal speech and language. No gross focal neurologic deficits are appreciated. No gait instability. Skin:  Skin is warm, dry and intact. No rash noted. Psychiatric: Mood and affect are normal. Speech and behavior are normal.  ____________________________________________   LABS (all labs ordered are listed, but only abnormal results are displayed)  Labs Reviewed  URINALYSIS COMPLETEWITH MICROSCOPIC ([place]) - Abnormal; Notable for the following:       Result Value   Color, Urine YELLOW (*)    APPearance CLEAR (*)    Hgb urine dipstick 1+ (*)    Bacteria, UA RARE (*)    Squamous Epithelial / LPF 0-5 (*)    All other components within normal limits  BASIC METABOLIC PANEL  CBC  TROPONIN I   ____________________________________________  EKG  ED ECG REPORT I, Hyland Mollenkopf J, the attending physician, personally viewed and interpreted this ECG.   Date: 08/11/2015  EKG Time: 2132  Rate: 62  Rhythm: normal EKG, normal sinus rhythm  Axis: Normal  Intervals:first-degree A-V block   ST&T Change: Nonspecific  ____________________________________________  RADIOLOGY  Left lower extremity Doppler interpreted per Dr. Quintella Reichert: No evidence of deep venous thrombosis in the left lower extremity. ____________________________________________   PROCEDURES  Procedure(s) performed: None  Procedures  Critical Care performed: No  ____________________________________________   INITIAL IMPRESSION / ASSESSMENT AND PLAN / ED COURSE  Pertinent labs & imaging results that were available during my care of the patient were reviewed by me and considered in my medical decision making (see chart for details).  65 year old  female who presents with generalized weakness. Initial laboratory urinalysis results are unremarkable. We'll add troponin and check orthostatic vital signs.  Clinical Course  Comment By Time  Updated patient of negative troponin results. Encouraged her to increase hydration and elevate her lower extremities. Will prescribe a three-day dose of HCTZ 25 mg to help with pedal edema. Patient will follow-up with her PCP closely. Strict return precautions given. Patient verbalizes understanding and agrees with plan of care. [name], MD 07/27 0206     ____________________________________________   FINAL CLINICAL IMPRESSION(S) / ED DIAGNOSES  Final diagnoses:  Weakness  Generalized weakness  Pedal edema      NEW MEDICATIONS STARTED DURING THIS VISIT:  Discharge Medication List as of 08/11/2015  2:11 AM    START taking these medications   Details  hydrochlorothiazide (HYDRODIURIL) 25 MG tablet Take 1 tablet (25 mg total) by mouth daily., Starting Thu 08/11/2015, Print         Note:  This document was prepared using Dragon voice recognition software and may include unintentional dictation errors.    [name], MD 08/11/15 [phone]

## 2015-08-11 NOTE — ED Notes (Signed)
Pt. Reports feeling like she is just exhausted and pushing herself too much at work. Hx of DVT reported. Pt. States legs just feel "tight and weak like they will give out"

## 2015-08-11 NOTE — ED Notes (Signed)
Pt. Verbalizes understanding of d/c instructions, prescriptions, and follow-up. VS stable.  Pt. In NAD at time of d/c and denies concerns. Pt. Ambulatory out of the unit with steady gait.

## 2015-08-15 ENCOUNTER — Emergency Department
Admission: EM | Admit: 2015-08-15 | Discharge: 2015-08-16 | Disposition: A | Discharge status: Home / Self Care | Payer: Medicare Other | Attending: Emergency Medicine | Admitting: Emergency Medicine

## 2015-08-15 ENCOUNTER — Emergency Department: Payer: Medicare Other

## 2015-08-15 ENCOUNTER — Encounter: Payer: Self-pay | Admitting: Emergency Medicine

## 2015-08-15 DIAGNOSIS — N390 Urinary tract infection, site not specified: Secondary | ICD-10-CM | POA: Diagnosis not present

## 2015-08-15 DIAGNOSIS — Z7982 Long term (current) use of aspirin: Secondary | ICD-10-CM | POA: Diagnosis not present

## 2015-08-15 DIAGNOSIS — J45909 Unspecified asthma, uncomplicated: Secondary | ICD-10-CM | POA: Diagnosis not present

## 2015-08-15 DIAGNOSIS — Z85528 Personal history of other malignant neoplasm of kidney: Secondary | ICD-10-CM | POA: Diagnosis not present

## 2015-08-15 DIAGNOSIS — Z79899 Other long term (current) drug therapy: Secondary | ICD-10-CM | POA: Diagnosis not present

## 2015-08-15 DIAGNOSIS — K5900 Constipation, unspecified: Secondary | ICD-10-CM

## 2015-08-15 DIAGNOSIS — G43909 Migraine, unspecified, not intractable, without status migrainosus: Secondary | ICD-10-CM | POA: Insufficient documentation

## 2015-08-15 DIAGNOSIS — R1084 Generalized abdominal pain: Secondary | ICD-10-CM

## 2015-08-15 DIAGNOSIS — I10 Essential (primary) hypertension: Secondary | ICD-10-CM | POA: Diagnosis not present

## 2015-08-15 LAB — COMPREHENSIVE METABOLIC PANEL
ALT: 14 U/L (ref 14–54)
AST: 19 U/L (ref 15–41)
Albumin: 4.4 g/dL (ref 3.5–5.0)
Alkaline Phosphatase: 53 U/L (ref 38–126)
Anion gap: 10 (ref 5–15)
BILIRUBIN TOTAL: 0.5 mg/dL (ref 0.3–1.2)
BUN: 26 mg/dL — AB (ref 6–20)
CO2: 23 mmol/L (ref 22–32)
CREATININE: 0.98 mg/dL (ref 0.44–1.00)
Calcium: 9.7 mg/dL (ref 8.9–10.3)
Chloride: 101 mmol/L (ref 101–111)
GFR calc Af Amer: >60 mL/min (ref >60)
GFR, EST NON AFRICAN AMERICAN: 59 mL/min — AB (ref >60)
Glucose, Bld: 99 mg/dL (ref 65–99)
Potassium: 3.7 mmol/L (ref 3.5–5.1)
Sodium: 134 mmol/L — ABNORMAL LOW (ref 135–145)
TOTAL PROTEIN: 8.7 g/dL — AB (ref 6.5–8.1)

## 2015-08-15 LAB — URINALYSIS COMPLETE WITH MICROSCOPIC (ARMC ONLY)
BILIRUBIN URINE: NEGATIVE
GLUCOSE, UA: NEGATIVE mg/dL
KETONES UR: NEGATIVE mg/dL
NITRITE: POSITIVE — AB
PH: 5 (ref 5.0–8.0)
Protein, ur: NEGATIVE mg/dL
Specific Gravity, Urine: 1.014 (ref 1.005–1.030)

## 2015-08-15 LAB — CBC
HCT: 40.6 % (ref 35.0–47.0)
Hemoglobin: 14.1 g/dL (ref 12.0–16.0)
MCH: 29.3 pg (ref 26.0–34.0)
MCHC: 34.9 g/dL (ref 32.0–36.0)
MCV: 84 fL (ref 80.0–100.0)
PLATELETS: 333 10*3/uL (ref 150–440)
RBC: 4.83 MIL/uL (ref 3.80–5.20)
RDW: 13.5 % (ref 11.5–14.5)
WBC: 9.6 10*3/uL (ref 3.6–11.0)

## 2015-08-15 LAB — LIPASE, BLOOD: Lipase: 24 U/L (ref 11–51)

## 2015-08-15 MED ORDER — ONDANSETRON 4 MG PO TBDP
4.0000 mg | ORAL_TABLET | Freq: Once | ORAL | Status: AC
  Administered 2015-08-15: 4 mg via ORAL

## 2015-08-15 MED ORDER — ONDANSETRON 4 MG PO TBDP
ORAL_TABLET | ORAL | Status: AC
  Filled 2015-08-15: qty 1

## 2015-08-15 MED ORDER — ONDANSETRON 4 MG PO TBDP: ORAL_TABLET | ORAL | 0 refills | Status: DC

## 2015-08-15 MED ORDER — CEPHALEXIN 500 MG PO CAPS: 500.0000 mg | ORAL_CAPSULE | Freq: Two times a day (BID) | ORAL | 0 refills | Status: DC

## 2015-08-15 NOTE — Discharge Instructions (Signed)
You have been seen in the Emergency Department (ED) for abdominal pain.  Your evaluation did not identify a clear cause of your symptoms but was generally reassuring.  It is possible that your symptoms are caused by mild to moderate constipation.   We recommend that you use one or more of the following over-the-counter medications in the order described:   1)  Colace (or Dulcolax) 100 mg:  This is a stool softener, and you may take it once or twice a day as needed. 2)  Senna tablets:  This is a bowel stimulant that will help "push" out your stool. It is the next step to add after you have tried a stool softener. 3)  Miralax (powder):  This medication works by drawing additional fluid into your intestines and helps to flush out your stool.  Mix the powder with water or juice according to label instructions.  It may help if the Colace and Senna are not sufficient, but you must be sure to use the recommended amount of water or juice when you mix up the powder. Remember that narcotic pain medications are constipating, so avoid them or minimize their use.  Drink plenty of fluids.  Return to the ED if your abdominal pain worsens or fails to improve, you develop bloody vomiting, bloody diarrhea, you are unable to tolerate fluids due to vomiting, fever greater than 101, or other symptoms that concern you.

## 2015-08-15 NOTE — ED Triage Notes (Signed)
Patient reports lower abd pain and headache that started about a week ago. Patient states that she was seen here for the same 4 days ago. Patient states that she does have some nausea but denies vomiting, diarrhea and fever.

## 2015-08-15 NOTE — ED Provider Notes (Signed)
[facility] Emergency Department Provider Note  ____________________________________________   First MD Initiated Contact with Patient 08/15/15 2151     (approximate)  I have reviewed the triage vital signs and the nursing notes.   HISTORY  Chief Complaint Abdominal Pain and Migraine    HPI Janet [name] is a 65 y.o. female who presents for evaluation of generalized weakness, nausea, and some aching pain in her abdomen that she states is been present for days, possibly as much as a week.  She was seen in the emergency department several days ago for essentially the same symptoms.  Workup was unremarkable at that time other than some mild peripheral edema.  She has a follow-up appointment tomorrow with her primary care doctor but she thought maybe she should get it checked out again given her discomfort.  She reports the symptoms are mild to moderate, nothing in particular makes them better nor worse.  She has had no vomiting, diarrhea, fever/chills, chest pain, shortness of breath, vomiting.He has difficulty articulating with the abdominal pain feels like but she settled on a description of aching throughout her entire abdomen.   Past Medical History:  Diagnosis Date  . Asthma   . Cancer ([place])    kidney  . Hypertension     Patient Active Problem List   Diagnosis Date Noted  . Generalized weakness 09/09/2014  . 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 ABDOMINAL HYSTERECTOMY      Prior to Admission medications   Medication Sig Start Date End Date Taking? Authorizing Provider  albuterol (PROVENTIL HFA;VENTOLIN HFA) 108 (90 BASE) MCG/ACT inhaler Inhale 2 puffs into the lungs every 6 (six) hours as needed for wheezing or shortness of breath. 12/24/14   [name], MD  aspirin EC 81 MG tablet Take 81 mg by mouth daily.    Historical Provider, MD  cephALEXin (KEFLEX) 500 MG capsule Take 1 capsule (500 mg  total) by mouth 2 (two) times daily. 08/15/15   Hinda Kehr, MD  hydrochlorothiazide (HYDRODIURIL) 25 MG tablet Take 1 tablet (25 mg total) by mouth daily. 08/11/15   [name], MD  lisinopril (PRINIVIL,ZESTRIL) 40 MG tablet Take 1 tablet by mouth daily. 08/27/14   Historical Provider, MD  ondansetron (ZOFRAN ODT) 4 MG disintegrating tablet Allow 1-2 tablets to dissolve in your mouth every 8 hours as needed for nausea/vomiting 08/15/15   Hinda Kehr, MD  potassium chloride SA (K-DUR,KLOR-CON) 20 MEQ tablet Take 20 mEq by mouth 2 (two) times daily.    Historical Provider, MD    Allergies Shellfish allergy and Penicillins  Family History  Problem Relation Age of Onset  . Stroke Neg Hx     Social History Social History  Substance Use Topics  . Smoking status: Never Smoker  . Smokeless tobacco: Never Used  . Alcohol use No    Review of Systems Constitutional: No fever/chills Eyes: No visual changes. ENT: No sore throat. Cardiovascular: Denies chest pain. Respiratory: Denies shortness of breath. Gastrointestinal: Generalized aching abdominal pain.  nausea, no vomiting.  No diarrhea.  No constipation. Genitourinary: Negative for dysuria. Musculoskeletal: Negative for back pain. Mild peripheral edema bilaterally Skin: Negative for rash. Neurological: Negative for headaches, focal weakness or numbness.  10-point ROS otherwise negative.  ____________________________________________   PHYSICAL EXAM:  VITAL SIGNS: ED Triage Vitals  Enc Vitals Group     BP 08/15/15 2012 (!) 138/93     Pulse Rate 08/15/15 2012  93     Resp 08/15/15 2012 18     Temp 08/15/15 2012 98.9 F (37.2 C)     Temp Source 08/15/15 2012 Oral     SpO2 08/15/15 2012 97 %     Weight 08/15/15 2012 216 lb (98 kg)     Height 08/15/15 2012 5\' 9"  (1.753 m)     Head Circumference --      Peak Flow --      Pain Score 08/15/15 2013 7     Pain Loc --      Pain Edu? --      Excl. in [place]? --     Constitutional:  Alert and oriented. Well appearing and in no acute distress. Eyes: Conjunctivae are normal. PERRL. EOMI. Head: Atraumatic. Nose: No congestion/rhinnorhea. Mouth/Throat: Mucous membranes are moist.  Oropharynx non-erythematous. Neck: No stridor.  No meningeal signs.   Cardiovascular: Normal rate, regular rhythm. Good peripheral circulation. Grossly normal heart sounds.   Respiratory: Normal respiratory effort.  No retractions. Lungs CTAB. Gastrointestinal: Soft and nontender even to deep palpation throughout her abdomen. No distention.  Musculoskeletal: No lower extremity tenderness nor edema. No gross deformities of extremities. Neurologic:  Normal speech and language. No gross focal neurologic deficits are appreciated.  Skin:  Skin is warm, dry and intact. No rash noted.   ____________________________________________   LABS (all labs ordered are listed, but only abnormal results are displayed)  Labs Reviewed  COMPREHENSIVE METABOLIC PANEL - Abnormal; Notable for the following:       Result Value   Sodium 134 (*)    BUN 26 (*)    Total Protein 8.7 (*)    GFR calc non Af Amer 59 (*)    All other components within normal limits  URINALYSIS COMPLETEWITH MICROSCOPIC ([facility] ONLY) - Abnormal; Notable for the following:    Color, Urine YELLOW (*)    APPearance HAZY (*)    Hgb urine dipstick 1+ (*)    Nitrite POSITIVE (*)    Leukocytes, UA 1+ (*)    Bacteria, UA MANY (*)    Squamous Epithelial / LPF 0-5 (*)    All other components within normal limits  URINE CULTURE  LIPASE, BLOOD  CBC   ____________________________________________  EKG  None ____________________________________________  RADIOLOGY   Dg Abdomen Acute W/chest  Result Date: 08/15/2015 CLINICAL DATA:  Lower abdominal pain and headache with for 1 week, mild nausea. History of hypertension, asthma and kidney cancer. EXAM: DG ABDOMEN ACUTE W/ 1V CHEST COMPARISON:  Chest radiograph April 23, 2015 FINDINGS:  Cardiomediastinal silhouette is normal. Lungs are clear, no pleural effusions. No pneumothorax. Soft tissue planes and included osseous structures are unremarkable. Bowel gas pattern is nondilated and nonobstructive. Paucity of small bowel gas. Moderate amount of retained large bowel stool. No intra-abdominal mass effect, pathologic calcifications or free air. Soft tissue planes and included osseous structures are non-suspicious. IMPRESSION: No acute cardiopulmonary process. Nonspecific bowel gas pattern. Moderate amount of retained large bowel stool. Electronically Signed   By: Elon Alas M.D.   On: 08/15/2015 22:25    ____________________________________________   PROCEDURES  Procedure(s) performed:   Procedures   ____________________________________________   INITIAL IMPRESSION / ASSESSMENT AND PLAN / ED COURSE  Pertinent labs & imaging results that were available during my care of the patient were reviewed by me and considered in my medical decision making (see chart for details).  Patient work up is again reassuring.  She seems to have developed a very mild urinary  tract infection with nitrite-positive urine, but otherwise her results are unremarkable.  I explained this to the patient and encouraged her to follow up as an outpatient tomorrow.  Of note she does have what appears to be some moderate constipation on her two-view abdomen series and I gave her instructions about how to treat constipation which may also help with her vague symptoms of aching abdominal pain.  I gave my usual and customary return precautions.    ____________________________________________  FINAL CLINICAL IMPRESSION(S) / ED DIAGNOSES  Final diagnoses:  Generalized abdominal pain  Constipation, unspecified constipation type  UTI (lower urinary tract infection)     MEDICATIONS GIVEN DURING THIS VISIT:  Medications  ondansetron (ZOFRAN-ODT) disintegrating tablet 4 mg (4 mg Oral Given 08/15/15  2226)     NEW OUTPATIENT MEDICATIONS STARTED DURING THIS VISIT:  New Prescriptions   CEPHALEXIN (KEFLEX) 500 MG CAPSULE    Take 1 capsule (500 mg total) by mouth 2 (two) times daily.   ONDANSETRON (ZOFRAN ODT) 4 MG DISINTEGRATING TABLET    Allow 1-2 tablets to dissolve in your mouth every 8 hours as needed for nausea/vomiting      Note:  This document was prepared using Dragon voice recognition software and may include unintentional dictation errors.    Hinda Kehr, MD 08/15/15 [phone]

## 2015-08-18 LAB — URINE CULTURE: SPECIAL REQUESTS: NORMAL

## 2015-09-29 ENCOUNTER — Ambulatory Visit
Admission: RE | Admit: 2015-09-29 | Discharge: 2015-09-29 | Disposition: A | Discharge status: Home / Self Care | Payer: Medicare Other | Source: Ambulatory Visit | Attending: Family Medicine | Admitting: Family Medicine

## 2015-09-29 ENCOUNTER — Other Ambulatory Visit: Payer: Self-pay | Admitting: Family Medicine

## 2015-09-29 DIAGNOSIS — Z1231 Encounter for screening mammogram for malignant neoplasm of breast: Secondary | ICD-10-CM | POA: Diagnosis present

## 2015-09-29 DIAGNOSIS — M85851 Other specified disorders of bone density and structure, right thigh: Secondary | ICD-10-CM | POA: Diagnosis not present

## 2015-09-29 DIAGNOSIS — Z13820 Encounter for screening for osteoporosis: Secondary | ICD-10-CM | POA: Diagnosis present

## 2015-10-03 ENCOUNTER — Emergency Department: Payer: Medicare Other

## 2015-10-03 ENCOUNTER — Inpatient Hospital Stay
Admission: EM | Admit: 2015-10-03 | Discharge: 2015-10-07 | DRG: 603 | Disposition: A | Discharge status: Home / Self Care | Payer: Medicare Other | Attending: Internal Medicine | Admitting: Internal Medicine

## 2015-10-03 DIAGNOSIS — E042 Nontoxic multinodular goiter: Secondary | ICD-10-CM | POA: Diagnosis present

## 2015-10-03 DIAGNOSIS — L03116 Cellulitis of left lower limb: Secondary | ICD-10-CM | POA: Diagnosis not present

## 2015-10-03 DIAGNOSIS — R110 Nausea: Secondary | ICD-10-CM

## 2015-10-03 DIAGNOSIS — N2889 Other specified disorders of kidney and ureter: Secondary | ICD-10-CM | POA: Diagnosis present

## 2015-10-03 DIAGNOSIS — L0291 Cutaneous abscess, unspecified: Secondary | ICD-10-CM

## 2015-10-03 DIAGNOSIS — L03115 Cellulitis of right lower limb: Secondary | ICD-10-CM | POA: Diagnosis present

## 2015-10-03 DIAGNOSIS — R600 Localized edema: Secondary | ICD-10-CM | POA: Diagnosis present

## 2015-10-03 DIAGNOSIS — L03119 Cellulitis of unspecified part of limb: Secondary | ICD-10-CM

## 2015-10-03 DIAGNOSIS — I10 Essential (primary) hypertension: Secondary | ICD-10-CM | POA: Diagnosis present

## 2015-10-03 DIAGNOSIS — I739 Peripheral vascular disease, unspecified: Secondary | ICD-10-CM | POA: Diagnosis present

## 2015-10-03 DIAGNOSIS — R112 Nausea with vomiting, unspecified: Secondary | ICD-10-CM | POA: Diagnosis present

## 2015-10-03 DIAGNOSIS — I872 Venous insufficiency (chronic) (peripheral): Secondary | ICD-10-CM | POA: Diagnosis present

## 2015-10-03 LAB — CBC
HEMATOCRIT: 39.2 % (ref 35.0–47.0)
HEMOGLOBIN: 12.9 g/dL (ref 12.0–16.0)
MCH: 28.4 pg (ref 26.0–34.0)
MCHC: 32.9 g/dL (ref 32.0–36.0)
MCV: 86.3 fL (ref 80.0–100.0)
PLATELETS: 334 10*3/uL (ref 150–440)
RBC: 4.55 MIL/uL (ref 3.80–5.20)
RDW: 14.1 % (ref 11.5–14.5)
WBC: 8.8 10*3/uL (ref 3.6–11.0)

## 2015-10-03 LAB — BASIC METABOLIC PANEL
ANION GAP: 5 (ref 5–15)
BUN: 14 mg/dL (ref 6–20)
CALCIUM: 9.5 mg/dL (ref 8.9–10.3)
CO2: 25 mmol/L (ref 22–32)
CREATININE: 0.85 mg/dL (ref 0.44–1.00)
Chloride: 107 mmol/L (ref 101–111)
Glucose, Bld: 94 mg/dL (ref 65–99)
Potassium: 3.5 mmol/L (ref 3.5–5.1)
SODIUM: 137 mmol/L (ref 135–145)

## 2015-10-03 MED ORDER — FENTANYL CITRATE (PF) 100 MCG/2ML IJ SOLN
50.0000 ug | Freq: Once | INTRAMUSCULAR | Status: AC
  Administered 2015-10-03: 50 ug via INTRAVENOUS

## 2015-10-03 MED ORDER — ONDANSETRON HCL 4 MG/2ML IJ SOLN
INTRAMUSCULAR | Status: AC
  Administered 2015-10-03: 4 mg via INTRAVENOUS
  Filled 2015-10-03: qty 2

## 2015-10-03 MED ORDER — FENTANYL CITRATE (PF) 100 MCG/2ML IJ SOLN
INTRAMUSCULAR | Status: AC
  Administered 2015-10-03: 50 ug via INTRAVENOUS
  Filled 2015-10-03: qty 2

## 2015-10-03 MED ORDER — CLINDAMYCIN PHOSPHATE 600 MG/50ML IV SOLN
600.0000 mg | Freq: Once | INTRAVENOUS | Status: AC
  Administered 2015-10-03: 600 mg via INTRAVENOUS
  Filled 2015-10-03 (×2): qty 50

## 2015-10-03 MED ORDER — ONDANSETRON HCL 4 MG/2ML IJ SOLN
4.0000 mg | Freq: Once | INTRAMUSCULAR | Status: AC
Start: 2015-10-03 — End: 2015-10-03
  Administered 2015-10-03: 4 mg via INTRAVENOUS

## 2015-10-03 MED ORDER — CLINDAMYCIN PHOSPHATE 900 MG/50ML IV SOLN: 900.0000 mg | Freq: Once | INTRAVENOUS | Status: DC

## 2015-10-03 NOTE — ED Triage Notes (Addendum)
Pt present to ED with c/o LEFT leg pain and swelling that started 2-3 days ago. Pt reports h/x of DVT within the "last year or two". Pt denies N/V/D; denies CP or SHOB. (+) pedal pulse, CMS intact.

## 2015-10-03 NOTE — ED Provider Notes (Signed)
[facility] Emergency Department Provider Note  ____________________________________________  Time seen: Approximately 10:26 PM  I have reviewed the triage vital signs and the nursing notes.   HISTORY  Chief Complaint Leg Pain    HPI Janet [name] is a 65 y.o. female with a history of HTN, remote DVT no longer anticoagulated, chronic lower extremity swelling presenting with left leg pain. The patient reports that she has bilateral left greater than right lower extremity swelling every day at the end of the day which improves overnight. However, over the past several days she has noticed that her legs have become erythematous, and painful, which is new. She has had some nausea without vomiting but no fevers or chills. No chest pain or shortness of breath, lightheadedness or syncope.   Past Medical History:  Diagnosis Date  . Asthma   . Cancer ([place])    kidney  . Hypertension     Patient Active Problem List   Diagnosis Date Noted  . Generalized weakness 09/09/2014  . 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 ABDOMINAL HYSTERECTOMY      Current Outpatient Rx  . Order #: [id number] Class: Print  . Order #: [id number] Class: Historical Med  . Order #: [id number] Class: Print  . Order #: [id number] Class: Print  . Order #: [id number] Class: Historical Med  . Order #: [id number] Class: Print  . Order #: [id number] Class: Historical Med    Allergies Shellfish allergy and Penicillins  Family History  Problem Relation Age of Onset  . Stroke Neg Hx     Social History Social History  Substance Use Topics  . Smoking status: Never Smoker  . Smokeless tobacco: Never Used  . Alcohol use No    Review of Systems Constitutional: No fever/chills.No lightheadedness or syncope. Eyes: No visual changes. ENT: No sore throat. No congestion or rhinorrhea. Cardiovascular: Denies chest pain. Denies  palpitations. Respiratory: Denies shortness of breath.  No cough. Gastrointestinal: No abdominal pain.  No nausea, no vomiting.  No diarrhea.  No constipation. Genitourinary: Negative for dysuria. Musculoskeletal: Negative for back pain. Positive bilateral lower extremity swelling with erythema and tenderness. Skin: Positive for rash. Neurological: Negative for headaches. No focal numbness, tingling or weakness.   10-point ROS otherwise negative.  ____________________________________________   PHYSICAL EXAM:  VITAL SIGNS: ED Triage Vitals [10/03/15 2106]  Enc Vitals Group     BP (!) 148/73     Pulse Rate 74     Resp 16     Temp 98 F (36.7 C)     Temp Source Oral     SpO2 99 %     Weight 216 lb (98 kg)     Height 5\' 7"  (1.702 m)     Head Circumference      Peak Flow      Pain Score 7     Pain Loc      Pain Edu?      Excl. in [place]?     Constitutional: Alert and oriented. Well appearing and in no acute distress. Answers questions appropriately. Eyes: Conjunctivae are normal.  EOMI. No scleral icterus. Head: Atraumatic. Nose: No congestion/rhinnorhea. Mouth/Throat: Mucous membranes are moist.  Neck: No stridor.  Supple.  No JVD. No meningismus. Cardiovascular: Normal rate, regular rhythm. No murmurs, rubs or gallops.  Respiratory: Normal respiratory effort.  No accessory muscle use or retractions. Lungs CTAB.  No wheezes, rales or ronchi. Gastrointestinal: Obese. Soft, nontender and nondistended.  No guarding or rebound.  No peritoneal signs. Musculoskeletal: Bilateral lower extremity swelling with pitting edema to the mid thighs bilaterally. Edema is worse in the left than the right. The patient has darkening of the skin that is consistent with chronic peripheral vascular disease to the two thirds tibial shaft. She also has overlying erythema with significant tenderness to palpation throughout both legs, left greater than right. Normal DP and PT pulses bilaterally normal  sensation to light touch. No palpable cords, negative Homans sign. Neurologic:  A&Ox3.  Speech is clear.  Face and smile are symmetric.  EOMI.  Moves all extremities well. Skin:  See musculoskeletal exam. Psychiatric: Mood and affect are normal. Speech and behavior are normal.  Normal judgement.  ____________________________________________   LABS (all labs ordered are listed, but only abnormal results are displayed)  Labs Reviewed  CULTURE, BLOOD (ROUTINE X 2)  CULTURE, BLOOD (ROUTINE X 2)  CBC  BASIC METABOLIC PANEL   ____________________________________________  EKG  Not indicated ____________________________________________  RADIOLOGY  US Venous Img Lower Unilateral Left  Result Date: 10/03/2015 CLINICAL DATA:  Left leg pain and swelling for 1 week. EXAM: Left LOWER EXTREMITY VENOUS DOPPLER ULTRASOUND TECHNIQUE: Gray-scale sonography with graded compression, as well as color Doppler and duplex ultrasound were performed to evaluate the lower extremity deep venous systems from the level of the common femoral vein and including the common femoral, femoral, profunda femoral, popliteal and calf veins including the posterior tibial, peroneal and gastrocnemius veins when visible. The superficial great saphenous vein was also interrogated. Spectral Doppler was utilized to evaluate flow at rest and with distal augmentation maneuvers in the common femoral, femoral and popliteal veins. COMPARISON:  08/09/2005 FINDINGS: Contralateral Common Femoral Vein: Respiratory phasicity is normal and symmetric with the symptomatic side. No evidence of thrombus. Normal compressibility. Common Femoral Vein: No evidence of thrombus. Normal compressibility, respiratory phasicity and response to augmentation. Saphenofemoral Junction: No evidence of thrombus. Normal compressibility and flow on color Doppler imaging. Profunda Femoral Vein: No evidence of thrombus. Normal compressibility and flow on color Doppler  imaging. Femoral Vein: No evidence of thrombus. Normal compressibility, respiratory phasicity and response to augmentation. Popliteal Vein: No evidence of thrombus. Normal compressibility, respiratory phasicity and response to augmentation. Calf Veins: No evidence of thrombus. Normal compressibility and flow on color Doppler imaging. Superficial Great Saphenous Vein: No evidence of thrombus. Normal compressibility and flow on color Doppler imaging. Venous Reflux:  None. Other Findings:  None. IMPRESSION: No evidence of deep venous thrombosis. Electronically Signed   By: Lucienne Capers M.D.   On: 10/03/2015 22:12    ____________________________________________   PROCEDURES  Procedure(s) performed: None  Procedures  Critical Care performed: No ____________________________________________   INITIAL IMPRESSION / ASSESSMENT AND PLAN / ED COURSE  Pertinent labs & imaging results that were available during my care of the patient were reviewed by me and considered in my medical decision making (see chart for details).  65 y.o. female with a history of DVT not anticoagulated with bilateral lower extremity swelling, erythema and tenderness. The patient is not having fevers but she is having nausea was disconcerting as a possible systemic symptom. I am most concerned about bilateral lower extremity cellulitis, but we'll also evaluate the patient for DVT. Patient will require admission to the hospital. Empiric abx will be initiated in the emergency department.  ____________________________________________  FINAL CLINICAL IMPRESSION(S) / ED DIAGNOSES  Final diagnoses:  Cellulitis of lower extremity, unspecified laterality  Nausea without vomiting    Clinical Course  NEW MEDICATIONS STARTED DURING THIS VISIT:  New Prescriptions   No medications on file      Eula Listen, MD 10/03/15 2236

## 2015-10-04 ENCOUNTER — Observation Stay: Payer: Medicare Other

## 2015-10-04 ENCOUNTER — Encounter: Payer: Self-pay | Admitting: Radiology

## 2015-10-04 LAB — BASIC METABOLIC PANEL
Anion gap: 4 — ABNORMAL LOW (ref 5–15)
BUN: 14 mg/dL (ref 6–20)
CALCIUM: 9 mg/dL (ref 8.9–10.3)
CO2: 23 mmol/L (ref 22–32)
CREATININE: 0.97 mg/dL (ref 0.44–1.00)
Chloride: 112 mmol/L — ABNORMAL HIGH (ref 101–111)
GFR calc Af Amer: >60 mL/min (ref >60)
GFR, EST NON AFRICAN AMERICAN: 60 mL/min — AB (ref >60)
GLUCOSE: 126 mg/dL — AB (ref 65–99)
Potassium: 3.7 mmol/L (ref 3.5–5.1)
Sodium: 139 mmol/L (ref 135–145)

## 2015-10-04 LAB — CBC
HCT: 35.2 % (ref 35.0–47.0)
Hemoglobin: 12.1 g/dL (ref 12.0–16.0)
MCH: 29 pg (ref 26.0–34.0)
MCHC: 34.2 g/dL (ref 32.0–36.0)
MCV: 84.6 fL (ref 80.0–100.0)
PLATELETS: 280 10*3/uL (ref 150–440)
RBC: 4.17 MIL/uL (ref 3.80–5.20)
RDW: 14.2 % (ref 11.5–14.5)
WBC: 6.8 10*3/uL (ref 3.6–11.0)

## 2015-10-04 MED ORDER — SENNOSIDES-DOCUSATE SODIUM 8.6-50 MG PO TABS: 1.0000 | ORAL_TABLET | Freq: Every evening | ORAL | Status: DC | PRN

## 2015-10-04 MED ORDER — ACETAMINOPHEN 325 MG PO TABS
650.0000 mg | ORAL_TABLET | Freq: Four times a day (QID) | ORAL | Status: DC | PRN
  Administered 2015-10-04 – 2015-10-05 (×2): 650 mg via ORAL
  Filled 2015-10-04 (×2): qty 2

## 2015-10-04 MED ORDER — HYDROCODONE-ACETAMINOPHEN 5-325 MG PO TABS
1.0000 | ORAL_TABLET | ORAL | Status: DC | PRN
  Administered 2015-10-04: 2 via ORAL
  Administered 2015-10-04: 1 via ORAL
  Filled 2015-10-04: qty 2
  Filled 2015-10-04: qty 1

## 2015-10-04 MED ORDER — CLINDAMYCIN PHOSPHATE 600 MG/50ML IV SOLN
600.0000 mg | Freq: Two times a day (BID) | INTRAVENOUS | Status: DC
  Administered 2015-10-04 – 2015-10-06 (×4): 600 mg via INTRAVENOUS
  Filled 2015-10-04 (×5): qty 50

## 2015-10-04 MED ORDER — ENOXAPARIN SODIUM 40 MG/0.4ML ~~LOC~~ SOLN
40.0000 mg | SUBCUTANEOUS | Status: DC
  Administered 2015-10-04 – 2015-10-06 (×3): 40 mg via SUBCUTANEOUS
  Filled 2015-10-04 (×3): qty 0.4

## 2015-10-04 MED ORDER — ASPIRIN EC 81 MG PO TBEC
81.0000 mg | DELAYED_RELEASE_TABLET | Freq: Every day | ORAL | Status: DC
  Administered 2015-10-04 – 2015-10-06 (×3): 81 mg via ORAL
  Filled 2015-10-04 (×3): qty 1

## 2015-10-04 MED ORDER — POTASSIUM CHLORIDE CRYS ER 20 MEQ PO TBCR
20.0000 meq | EXTENDED_RELEASE_TABLET | Freq: Two times a day (BID) | ORAL | Status: DC
Start: 2015-10-04 — End: 2015-10-05
  Administered 2015-10-04 – 2015-10-05 (×3): 20 meq via ORAL
  Filled 2015-10-04 (×4): qty 1

## 2015-10-04 MED ORDER — ACETAMINOPHEN 650 MG RE SUPP: 650.0000 mg | Freq: Four times a day (QID) | RECTAL | Status: DC | PRN

## 2015-10-04 MED ORDER — BISACODYL 5 MG PO TBEC
5.0000 mg | DELAYED_RELEASE_TABLET | Freq: Every day | ORAL | Status: DC | PRN
  Administered 2015-10-06: 5 mg via ORAL
  Filled 2015-10-04: qty 1

## 2015-10-04 MED ORDER — SODIUM CHLORIDE 0.9 % IV SOLN
INTRAVENOUS | Status: DC
  Administered 2015-10-04 (×2): via INTRAVENOUS

## 2015-10-04 MED ORDER — LISINOPRIL 20 MG PO TABS
40.0000 mg | ORAL_TABLET | Freq: Every day | ORAL | Status: DC
  Administered 2015-10-04 – 2015-10-06 (×3): 40 mg via ORAL
  Filled 2015-10-04 (×3): qty 2

## 2015-10-04 MED ORDER — ZOLPIDEM TARTRATE 5 MG PO TABS: 5.0000 mg | ORAL_TABLET | Freq: Every evening | ORAL | Status: DC | PRN

## 2015-10-04 MED ORDER — ONDANSETRON HCL 4 MG/2ML IJ SOLN
4.0000 mg | Freq: Four times a day (QID) | INTRAMUSCULAR | Status: DC | PRN
  Administered 2015-10-05: 4 mg via INTRAVENOUS
  Filled 2015-10-04: qty 2

## 2015-10-04 MED ORDER — IOPAMIDOL (ISOVUE-300) INJECTION 61%
100.0000 mL | Freq: Once | INTRAVENOUS | Status: AC | PRN
  Administered 2015-10-04: 100 mL via INTRAVENOUS

## 2015-10-04 MED ORDER — ONDANSETRON HCL 4 MG PO TABS: 4.0000 mg | ORAL_TABLET | Freq: Four times a day (QID) | ORAL | Status: DC | PRN

## 2015-10-04 MED ORDER — ALBUTEROL SULFATE (2.5 MG/3ML) 0.083% IN NEBU: 3.0000 mL | INHALATION_SOLUTION | Freq: Four times a day (QID) | RESPIRATORY_TRACT | Status: DC | PRN

## 2015-10-04 MED ORDER — MAGNESIUM CITRATE PO SOLN
1.0000 | Freq: Once | ORAL | Status: DC | PRN
  Filled 2015-10-04: qty 296

## 2015-10-04 NOTE — Progress Notes (Signed)
Dr. Benjie Karvonen notified of CT result; acknowledged.

## 2015-10-04 NOTE — Progress Notes (Signed)
[place] [facility] [place] NAME: Janet [name]    MR#:  [id number]  DATE OF BIRTH:  Jun 08, 1950  SUBJECTIVE:   Patient here with pain especially in left leg.  REVIEW OF SYSTEMS:    Review of Systems  Constitutional: Negative.  Negative for chills, fever and malaise/fatigue.  HENT: Negative.  Negative for ear discharge, ear pain, hearing loss, nosebleeds and sore throat.   Eyes: Negative.  Negative for blurred vision and pain.  Respiratory: Negative.  Negative for cough, hemoptysis, shortness of breath and wheezing.   Cardiovascular: Negative.  Negative for chest pain, palpitations and leg swelling.  Gastrointestinal: Negative.  Negative for abdominal pain, blood in stool, diarrhea, nausea and vomiting.  Genitourinary: Negative.  Negative for dysuria.  Musculoskeletal: Negative.  Negative for back pain.  Skin:       Cellulitis left leg Chronic Venous stasis  Neurological: Negative for dizziness, tremors, speech change, focal weakness, seizures and headaches.  Endo/Heme/Allergies: Negative.  Does not bruise/bleed easily.  Psychiatric/Behavioral: Negative.  Negative for depression, hallucinations and suicidal ideas.    Tolerating Diet:Yes      DRUG ALLERGIES:   Allergies  Allergen Reactions  . Shellfish Allergy Anaphylaxis  . Penicillins Hives and Other (See Comments)    Has patient had a PCN reaction causing immediate rash, facial/tongue/throat swelling, SOB or lightheadedness with hypotension: no Has patient had a PCN reaction causing severe rash involving mucus membranes or skin necrosis: no Has patient had a PCN reaction that required hospitalization? no Has patient had a PCN reaction occurring within the last 10 years: no If all of the above answers are "NO", then may proceed with Cephalosporin use.     VITALS:  Blood pressure (!) 141/61, pulse 74, temperature 98.1 F (36.7 C), temperature source Oral, resp. rate 16, height 5\' 7"   (1.702 m), weight 98 kg (216 lb), SpO2 96 %.  PHYSICAL EXAMINATION:   Physical Exam  Constitutional: She is oriented to person, place, and time and well-developed, well-nourished, and in no distress. No distress.  HENT:  Head: Normocephalic.  Eyes: No scleral icterus.  Neck: Normal range of motion. Neck supple. No JVD present. No tracheal deviation present.  Cardiovascular: Normal rate, regular rhythm and normal heart sounds.  Exam reveals no gallop and no friction rub.   No murmur heard. Pulmonary/Chest: Effort normal and breath sounds normal. No respiratory distress. She has no wheezes. She has no rales. She exhibits no tenderness.  Abdominal: Soft. Bowel sounds are normal. She exhibits no distension and no mass. There is no tenderness. There is no rebound and no guarding.  Musculoskeletal: Normal range of motion. She exhibits no edema.  Neurological: She is alert and oriented to person, place, and time.  Skin: Skin is warm. No rash noted. No erythema.  Left leg hard and indurated area with underlying chr venous stasis changes very tender to touch and streaking erythema  Psychiatric: Affect and judgment normal.      LABORATORY PANEL:   CBC  Recent Labs Lab 10/04/15 0531  WBC 6.8  HGB 12.1  HCT 35.2  PLT 280   ------------------------------------------------------------------------------------------------------------------  Chemistries   Recent Labs Lab 10/04/15 0531  NA 139  K 3.7  CL 112*  CO2 23  GLUCOSE 126*  BUN 14  CREATININE 0.97  CALCIUM 9.0   ------------------------------------------------------------------------------------------------------------------  Cardiac Enzymes No results for input(s): TROPONINI in the last 168 hours. ------------------------------------------------------------------------------------------------------------------  RADIOLOGY:  Ct Tibia Fibula Left W Contrast  Result  Date: 10/04/2015 CLINICAL DATA:  Left leg cellulitis.  Small raised area proximal lateral lower leg. EXAM: CT OF THE LOWER LEFT EXTREMITY WITH CONTRAST TECHNIQUE: Multidetector CT imaging of the lower left extremity was performed according to the standard protocol following intravenous contrast administration. COMPARISON:  None. CONTRAST:  137mL ISOVUE-300 IOPAMIDOL (ISOVUE-300) INJECTION 61% FINDINGS: Bones/Joint/Cartilage No acute fracture or dislocation. No lytic or sclerotic osseous lesion. Ankle mortise is intact. Subtalar joints are normal. 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 Mild osteoarthritis of the lateral patellofemoral compartment. No periosteal reaction or bone destruction. Ligaments Suboptimally assessed by CT. Muscles and Tendons Intact patellar tendon. Intact Achilles tendon. Grossly intact plantar fascia. Visualized flexor, extensor and peroneal tendons are intact. Muscles enhance normally and homogeneously. No intramuscular fluid collection or hematoma. Soft tissues Soft tissue edema circumferentially around the mid and distal left lower leg. No fluid collection or hematoma. IMPRESSION: 1. Soft tissue edema circumferentially around the mid and distal left lower leg most concerning for cellulitis. No drainable fluid collection to suggest an abscess. Electronically Signed   By: Kathreen Devoid   On: 10/04/2015 09:31   US Venous Img Lower Unilateral Left  Result Date: 10/03/2015 CLINICAL DATA:  Left leg pain and swelling for 1 week. EXAM: Left LOWER EXTREMITY VENOUS DOPPLER ULTRASOUND TECHNIQUE: Gray-scale sonography with graded compression, as well as color Doppler and duplex ultrasound were performed to evaluate the lower extremity deep venous systems from the level of the common femoral vein and including the common femoral, femoral, profunda femoral, popliteal and calf veins including the posterior tibial, peroneal and gastrocnemius veins when visible. The superficial great saphenous vein was  also interrogated. Spectral Doppler was utilized to evaluate flow at rest and with distal augmentation maneuvers in the common femoral, femoral and popliteal veins. COMPARISON:  08/09/2005 FINDINGS: Contralateral Common Femoral Vein: Respiratory phasicity is normal and symmetric with the symptomatic side. No evidence of thrombus. Normal compressibility. Common Femoral Vein: No evidence of thrombus. Normal compressibility, respiratory phasicity and response to augmentation. Saphenofemoral Junction: No evidence of thrombus. Normal compressibility and flow on color Doppler imaging. Profunda Femoral Vein: No evidence of thrombus. Normal compressibility and flow on color Doppler imaging. Femoral Vein: No evidence of thrombus. Normal compressibility, respiratory phasicity and response to augmentation. Popliteal Vein: No evidence of thrombus. Normal compressibility, respiratory phasicity and response to augmentation. Calf Veins: No evidence of thrombus. Normal compressibility and flow on color Doppler imaging. Superficial Great Saphenous Vein: No evidence of thrombus. Normal compressibility and flow on color Doppler imaging. Venous Reflux:  None. Other Findings:  None. IMPRESSION: No evidence of deep venous thrombosis. Electronically Signed   By: Lucienne Capers M.D.   On: 10/03/2015 22:12   US Venous Img Lower Unilateral Right  Result Date: 10/04/2015 CLINICAL DATA:  Acute onset of right leg pain and swelling. Initial encounter. EXAM: RIGHT LOWER EXTREMITY VENOUS DOPPLER ULTRASOUND TECHNIQUE: Gray-scale sonography with graded compression, as well as color Doppler and duplex ultrasound were performed to evaluate the lower extremity deep venous systems from the level of the common femoral vein and including the common femoral, femoral, profunda femoral, popliteal and calf veins including the posterior tibial, peroneal and gastrocnemius veins when visible. The superficial great saphenous vein was also interrogated.  Spectral Doppler was utilized to evaluate flow at rest and with distal augmentation maneuvers in the common femoral, femoral and popliteal veins. COMPARISON:  Bilateral lower extremity venous Doppler ultrasound performed 05/25/2009 FINDINGS: Contralateral Common Femoral Vein: Respiratory phasicity  is normal and symmetric with the symptomatic side. No evidence of thrombus. Normal compressibility. Common Femoral Vein: No evidence of thrombus. Normal compressibility, respiratory phasicity and response to augmentation. Saphenofemoral Junction: No evidence of thrombus. Normal compressibility and flow on color Doppler imaging. Profunda Femoral Vein: No evidence of thrombus. Normal compressibility and flow on color Doppler imaging. Femoral Vein: No evidence of thrombus. Normal compressibility, respiratory phasicity and response to augmentation. Popliteal Vein: No evidence of thrombus. Normal compressibility, respiratory phasicity and response to augmentation. Calf Veins: No evidence of thrombus. Normal compressibility and flow on color Doppler imaging. Superficial Great Saphenous Vein: No evidence of thrombus. Normal compressibility and flow on color Doppler imaging. Venous Reflux:  None. Other Findings:  None. IMPRESSION: No evidence of deep venous thrombosis. Electronically Signed   By: Garald Balding M.D.   On: 10/04/2015 00:31     ASSESSMENT AND PLAN:   65 year old female with chronic Venoustasis changes who presents with left lower extremity cellulitis.  1. Left lower extremity cellulitis: CT shows no evidence of abscess. Continue IV clindamycin. I asked nurse to elevate leg on 2-3 pillows. Continue pain control.  2. Essential hypertension: Continue lisinopril. 3. History of hypokalemia: Continue potassium supplementation.   Management plans discussed with the patient and she is in agreement.  CODE STATUS: full  TOTAL TIME TAKING CARE OF THIS PATIENT: 30 minutes.     POSSIBLE D/C 2 days,  DEPENDING ON CLINICAL CONDITION.   Johnna Bollier M.D on 10/04/2015 at 11:32 AM  Between 7am to 6pm - Pager - [phone] After 6pm go to [email or web address] - password [email or web address] [place] [facility]  Office  [phone]  CC: Primary care physician; [facility]  Note: This dictation was prepared with Dragon dictation along with smaller phrase technology. Any transcriptional errors that result from this process are unintentional.

## 2015-10-04 NOTE — H&P (Signed)
[place] [facility] Admission History and Physical [employer], D.O.  ---------------------------------------------------------------------------------------------------------------------   PATIENT NAME: Janet [name] MR#: [id number] DATE OF BIRTH: 26-Sep-1950 DATE OF ADMISSION: 10/03/2015 PRIMARY CARE PHYSICIAN: [facility]  REQUESTING/REFERRING PHYSICIAN: ED Dr. Mariea Clonts  CHIEF COMPLAINT: Chief Complaint  Patient presents with  . Leg Pain    HISTORY OF PRESENT ILLNESS: Janet [name] is a 65 y.o. female with a known history of Hypertension, peripheral vascular disease, history of DVT not on anticoagulation was in a usual state of health until about 5 days ago when she experienced bilateral lower extremity pain which is increasing in associated with dependent edema. She reports that her left lateral leg has redness, tenderness and pain with ambulation. She reports mild nausea, no associated vomiting, constipation or diarrhea. She denies fevers, chills, recent illness.  Otherwise there has been no change in status. Patient has been taking medication as prescribed and there has been no recent change in medication or diet.  There has been no recent illness, travel or sick contacts.    Patient denies fevers/chills, weakness, dizziness, chest pain, shortness of breath, V/C/D, abdominal pain, dysuria/frequency, changes in mental status.   EMS/ED COURSE:   Patient received IV [place]  PAST MEDICAL HISTORY: Past Medical History:  Diagnosis Date  . Asthma   . Cancer ([place])    kidney  . Hypertension       PAST SURGICAL HISTORY: Past Surgical History:  Procedure Laterality Date  . ABDOMINAL HYSTERECTOMY        SOCIAL HISTORY: Social History  Substance Use Topics  . Smoking status: Never Smoker  . Smokeless tobacco: Never Used  . Alcohol use No      FAMILY HISTORY: Family History  Problem Relation Age of Onset  . Stroke Neg Hx       MEDICATIONS AT HOME: Prior to Admission medications   Medication Sig Start Date End Date Taking? Authorizing Provider  albuterol (PROVENTIL HFA;VENTOLIN HFA) 108 (90 BASE) MCG/ACT inhaler Inhale 2 puffs into the lungs every 6 (six) hours as needed for wheezing or shortness of breath. 12/24/14  Yes [name], MD  aspirin EC 81 MG tablet Take 81 mg by mouth daily.   Yes Historical Provider, MD  lisinopril (PRINIVIL,ZESTRIL) 40 MG tablet Take 1 tablet by mouth daily. 08/27/14  Yes Historical Provider, MD  potassium chloride SA (K-DUR,KLOR-CON) 20 MEQ tablet Take 20 mEq by mouth 2 (two) times daily.    Historical Provider, MD      DRUG ALLERGIES: Allergies  Allergen Reactions  . Shellfish Allergy Anaphylaxis  . Penicillins Hives and Other (See Comments)    Has patient had a PCN reaction causing immediate rash, facial/tongue/throat swelling, SOB or lightheadedness with hypotension: no Has patient had a PCN reaction causing severe rash involving mucus membranes or skin necrosis: no Has patient had a PCN reaction that required hospitalization? no Has patient had a PCN reaction occurring within the last 10 years: no If all of the above answers are "NO", then may proceed with Cephalosporin use.      REVIEW OF SYSTEMS: CONSTITUTIONAL: No fever/chills, fatigue, weakness, weight gain/loss, headache EYES: No blurry or double vision. ENT: No tinnitus, postnasal drip, redness or soreness of the oropharynx. RESPIRATORY: No cough, wheeze, hemoptysis, dyspnea. CARDIOVASCULAR: No chest pain, orthopnea, palpitations, syncope. GASTROINTESTINAL: Positive nausea negative, vomiting, constipation, diarrhea, abdominal pain, hematemesis, melena or hematochezia. GENITOURINARY: No dysuria or hematuria. ENDOCRINE: No polyuria or nocturia. No heat or cold intolerance. HEMATOLOGY: No  anemia, bruising, bleeding. INTEGUMENTARY: No, ulcers, lesions. Positive bilateral lower extremity chronic venous stasis  changes, rash MUSCULOSKELETAL: No arthritis, gout. Positive bilateral lower chemistry swelling NEUROLOGIC: No numbness, tingling, weakness or ataxia. No seizure-type activity. PSYCHIATRIC: No anxiety, depression, insomnia.  PHYSICAL EXAMINATION: VITAL SIGNS: Blood pressure 134/64, pulse 73, temperature 98 F (36.7 C), temperature source Oral, resp. rate 16, height 5\' 7"  (1.702 m), weight 98 kg (216 lb), SpO2 97 %.  GENERAL: 65 y.o.-year-old lack female patient, well-developed, well-nourished lying in the bed in no acute distress.  Pleasant and cooperative.   HEENT: Head atraumatic, normocephalic. Pupils equal, round, reactive to light and accommodation. No scleral icterus. Extraocular muscles intact. Nares are patent. Oropharynx is clear. Mucus membranes moist. NECK: Supple, full range of motion. No JVD, no bruit heard. No thyroid enlargement, no tenderness, no cervical lymphadenopathy. CHEST: Normal breath sounds bilaterally. No wheezing, rales, rhonchi or crackles. No use of accessory muscles of respiration.  No reproducible chest wall tenderness.  CARDIOVASCULAR: S1, S2 normal. No murmurs, rubs, or gallops. Cap refill <2 seconds. ABDOMEN: Soft, nontender, nondistended. No rebound, guarding, rigidity. Normoactive bowel sounds present in all four quadrants. No organomegaly or mass. EXTREMITIES: Full range of motion. There is mild bilateral pitting edema to the mid thighs bilaterally, left greater than right.  NEUROLOGIC: Cranial nerves II through XII are grossly intact with no focal sensorimotor deficit. Muscle strength 5/5 in all extremities. Sensation intact. Gait not checked. PSYCHIATRIC: The patient is alert and oriented x 3. Normal affect, mood, thought content. SKIN: Warm, dry, and intact without obvious rash, lesion, or ulcer except following:There is dark discoloration consistent with chronic venous insufficiency in the bilateral lower extremities from the mid calf to the feet, left  greater than right. There is moderate redness to the left lateral leg at the mid calf. There is mild tenderness to palpation and warmth bilateral lower extremities. There is mild redness at the anterior mid calf on the right.   LABORATORY PANEL:  CBC  Recent Labs Lab 10/03/15 2232  WBC 8.8  HGB 12.9  HCT 39.2  PLT 334   ----------------------------------------------------------------------------------------------------------------- Chemistries  Recent Labs Lab 10/03/15 2232  NA 137  K 3.5  CL 107  CO2 25  GLUCOSE 94  BUN 14  CREATININE 0.85  CALCIUM 9.5   ------------------------------------------------------------------------------------------------------------------ Cardiac Enzymes No results for input(s): TROPONINI in the last 168 hours. ------------------------------------------------------------------------------------------------------------------  RADIOLOGY: US Venous Img Lower Unilateral Left  Result Date: 10/03/2015 CLINICAL DATA:  Left leg pain and swelling for 1 week. EXAM: Left LOWER EXTREMITY VENOUS DOPPLER ULTRASOUND TECHNIQUE: Gray-scale sonography with graded compression, as well as color Doppler and duplex ultrasound were performed to evaluate the lower extremity deep venous systems from the level of the common femoral vein and including the common femoral, femoral, profunda femoral, popliteal and calf veins including the posterior tibial, peroneal and gastrocnemius veins when visible. The superficial great saphenous vein was also interrogated. Spectral Doppler was utilized to evaluate flow at rest and with distal augmentation maneuvers in the common femoral, femoral and popliteal veins. COMPARISON:  08/09/2005 FINDINGS: Contralateral Common Femoral Vein: Respiratory phasicity is normal and symmetric with the symptomatic side. No evidence of thrombus. Normal compressibility. Common Femoral Vein: No evidence of thrombus. Normal compressibility, respiratory  phasicity and response to augmentation. Saphenofemoral Junction: No evidence of thrombus. Normal compressibility and flow on color Doppler imaging. Profunda Femoral Vein: No evidence of thrombus. Normal compressibility and flow on color Doppler imaging. Femoral Vein: No evidence of thrombus. Normal  compressibility, respiratory phasicity and response to augmentation. Popliteal Vein: No evidence of thrombus. Normal compressibility, respiratory phasicity and response to augmentation. Calf Veins: No evidence of thrombus. Normal compressibility and flow on color Doppler imaging. Superficial Great Saphenous Vein: No evidence of thrombus. Normal compressibility and flow on color Doppler imaging. Venous Reflux:  None. Other Findings:  None. IMPRESSION: No evidence of deep venous thrombosis. Electronically Signed   By: Lucienne Capers M.D.   On: 10/03/2015 22:12   US Venous Img Lower Unilateral Right  Result Date: 10/04/2015 CLINICAL DATA:  Acute onset of right leg pain and swelling. Initial encounter. EXAM: RIGHT LOWER EXTREMITY VENOUS DOPPLER ULTRASOUND TECHNIQUE: Gray-scale sonography with graded compression, as well as color Doppler and duplex ultrasound were performed to evaluate the lower extremity deep venous systems from the level of the common femoral vein and including the common femoral, femoral, profunda femoral, popliteal and calf veins including the posterior tibial, peroneal and gastrocnemius veins when visible. The superficial great saphenous vein was also interrogated. Spectral Doppler was utilized to evaluate flow at rest and with distal augmentation maneuvers in the common femoral, femoral and popliteal veins. COMPARISON:  Bilateral lower extremity venous Doppler ultrasound performed 05/25/2009 FINDINGS: Contralateral Common Femoral Vein: Respiratory phasicity is normal and symmetric with the symptomatic side. No evidence of thrombus. Normal compressibility. Common Femoral Vein: No evidence of  thrombus. Normal compressibility, respiratory phasicity and response to augmentation. Saphenofemoral Junction: No evidence of thrombus. Normal compressibility and flow on color Doppler imaging. Profunda Femoral Vein: No evidence of thrombus. Normal compressibility and flow on color Doppler imaging. Femoral Vein: No evidence of thrombus. Normal compressibility, respiratory phasicity and response to augmentation. Popliteal Vein: No evidence of thrombus. Normal compressibility, respiratory phasicity and response to augmentation. Calf Veins: No evidence of thrombus. Normal compressibility and flow on color Doppler imaging. Superficial Great Saphenous Vein: No evidence of thrombus. Normal compressibility and flow on color Doppler imaging. Venous Reflux:  None. Other Findings:  None. IMPRESSION: No evidence of deep venous thrombosis. Electronically Signed   By: Garald Balding M.D.   On: 10/04/2015 00:31   IMPRESSION AND PLAN:  This is a 65 y.o. female with a history of hypertension, peripheral vascular disease, chronic venous insufficiency, history of DVT in the past not currently anticoagulated now being admitted with: 1. Bilateral lower family cellulitis, left worse than right. We will admit to observation for IV clindamycin, IV fluid hydration, local wound care and blood cultures. 2. Nausea, may be related to infection. We'll monitor and provide antiemetics 3. History of hypertension-continue lisinopril, aspirin 4. History of hypokalemia-continue potassium 5. History of asthma-continue albuterol as needed.   Diet/Nutrition: Heart healthy Fluids: IV normal saline DVT Px: Lovenox, SCDs and early ambulation Code Status: Full  All the records are reviewed and case discussed with ED provider. Management plans discussed with the patient and/or family who express understanding and agree with plan of care.   TOTAL TIME TAKING CARE OF THIS PATIENT: 50 minutes.   Harriet Sutphen D.O. on 10/04/2015 at  12:47 AM Between 7am to 6pm - Pager - [phone] After 6pm go to [email or web address] - [employer] [facility] Office [phone] CC: Primary care physician; [facility]     Note: This dictation was prepared with Dragon dictation along with smaller phrase technology. Any transcriptional errors that result from this process are unintentional.

## 2015-10-05 DIAGNOSIS — I10 Essential (primary) hypertension: Secondary | ICD-10-CM | POA: Diagnosis present

## 2015-10-05 DIAGNOSIS — E042 Nontoxic multinodular goiter: Secondary | ICD-10-CM | POA: Diagnosis present

## 2015-10-05 DIAGNOSIS — I872 Venous insufficiency (chronic) (peripheral): Secondary | ICD-10-CM | POA: Diagnosis present

## 2015-10-05 DIAGNOSIS — I739 Peripheral vascular disease, unspecified: Secondary | ICD-10-CM | POA: Diagnosis present

## 2015-10-05 DIAGNOSIS — N2889 Other specified disorders of kidney and ureter: Secondary | ICD-10-CM | POA: Diagnosis present

## 2015-10-05 DIAGNOSIS — L03116 Cellulitis of left lower limb: Secondary | ICD-10-CM | POA: Diagnosis present

## 2015-10-05 DIAGNOSIS — R112 Nausea with vomiting, unspecified: Secondary | ICD-10-CM | POA: Diagnosis present

## 2015-10-05 DIAGNOSIS — R600 Localized edema: Secondary | ICD-10-CM | POA: Diagnosis present

## 2015-10-05 LAB — BASIC METABOLIC PANEL
ANION GAP: 4 — AB (ref 5–15)
BUN: 17 mg/dL (ref 6–20)
CALCIUM: 8.9 mg/dL (ref 8.9–10.3)
CO2: 23 mmol/L (ref 22–32)
Chloride: 112 mmol/L — ABNORMAL HIGH (ref 101–111)
Creatinine, Ser: 0.75 mg/dL (ref 0.44–1.00)
GFR calc Af Amer: >60 mL/min (ref >60)
GFR calc non Af Amer: >60 mL/min (ref >60)
GLUCOSE: 106 mg/dL — AB (ref 65–99)
POTASSIUM: 4 mmol/L (ref 3.5–5.1)
Sodium: 139 mmol/L (ref 135–145)

## 2015-10-05 MED ORDER — FUROSEMIDE 10 MG/ML IJ SOLN
40.0000 mg | Freq: Once | INTRAMUSCULAR | Status: AC
  Administered 2015-10-05: 40 mg via INTRAVENOUS
  Filled 2015-10-05: qty 4

## 2015-10-05 NOTE — Plan of Care (Signed)
Problem: Bowel/Gastric: Goal: Will not experience complications related to bowel motility Outcome: Progressing Pt has ambulated successfully with physical therapy, is progressing toward anticipated discharge on 9/21. Pt has remained free of falls/injury this shift and is able to state plan of care.

## 2015-10-05 NOTE — Progress Notes (Signed)
[place] [facility] [place] NAME: Janet [name]    MR#:  [id number]  DATE OF BIRTH:  October 20, 1950  SUBJECTIVE:  CHIEF COMPLAINT:   Chief Complaint  Patient presents with  . Leg Pain  vomited once this am, not feeling great. Having pain (pressure) in LLE, husband at bedside. REVIEW OF SYSTEMS:  Review of Systems  Constitutional: Positive for malaise/fatigue. Negative for chills, fever and weight loss.  HENT: Negative for nosebleeds and sore throat.   Eyes: Negative for blurred vision.  Respiratory: Negative for cough, shortness of breath and wheezing.   Cardiovascular: Positive for leg swelling. Negative for chest pain, orthopnea and PND.  Gastrointestinal: Negative for abdominal pain, constipation, diarrhea, heartburn, nausea and vomiting.  Genitourinary: Negative for dysuria and urgency.  Musculoskeletal: Negative for back pain.  Skin: Negative for rash.  Neurological: Negative for dizziness, speech change, focal weakness and headaches.  Endo/Heme/Allergies: Does not bruise/bleed easily.  Psychiatric/Behavioral: Negative for depression.    DRUG ALLERGIES:   Allergies  Allergen Reactions  . Shellfish Allergy Anaphylaxis  . Penicillins Hives and Other (See Comments)    Has patient had a PCN reaction causing immediate rash, facial/tongue/throat swelling, SOB or lightheadedness with hypotension: no Has patient had a PCN reaction causing severe rash involving mucus membranes or skin necrosis: no Has patient had a PCN reaction that required hospitalization? no Has patient had a PCN reaction occurring within the last 10 years: no If all of the above answers are "NO", then may proceed with Cephalosporin use.    VITALS:  Blood pressure (!) 113/56, pulse 62, temperature 97.5 F (36.4 C), temperature source Oral, resp. rate 16, height 5\' 7"  (1.702 m), weight 98 kg (216 lb), SpO2 100 %. PHYSICAL EXAMINATION:  Physical Exam  Constitutional: She is  oriented to person, place, and time and well-developed, well-nourished, and in no distress.  HENT:  Head: Normocephalic and atraumatic.  Eyes: Conjunctivae and EOM are normal. Pupils are equal, round, and reactive to light.  Neck: Normal range of motion. Neck supple. No tracheal deviation present. No thyromegaly present.  Cardiovascular: Normal rate, regular rhythm and normal heart sounds.   Pulmonary/Chest: Effort normal and breath sounds normal. No respiratory distress. She has no wheezes. She exhibits no tenderness.  Abdominal: Soft. Bowel sounds are normal. She exhibits no distension. There is no tenderness.  Musculoskeletal: Normal range of motion.  Neurological: She is alert and oriented to person, place, and time. No cranial nerve deficit.  Skin: Skin is warm and dry. No rash noted.  dark discoloration consistent with chronic venous stasis changes in the bilateral lower extremities from the mid calf to the feet, left greater than right. There is mild tenderness to palpation (pressure feeling)  Psychiatric: Mood and affect normal.   LABORATORY PANEL:   CBC  Recent Labs Lab 10/04/15 0531  WBC 6.8  HGB 12.1  HCT 35.2  PLT 280   ------------------------------------------------------------------------------------------------------------------ Chemistries   Recent Labs Lab 10/05/15 0349  NA 139  K 4.0  CL 112*  CO2 23  GLUCOSE 106*  BUN 17  CREATININE 0.75  CALCIUM 8.9   RADIOLOGY:  No results found. ASSESSMENT AND PLAN:  65 year old female with chronic Venous stasis changes admitted with left lower extremity cellulitis.  1. Left lower extremity cellulitis: CT shows no evidence of abscess. Continue IV clindamycin. - d/w pt importance of elevating leg on 2-3 pillows while in bed. Stop norco - still has lots of swelling in LLE  which is likely causing her to have pressure feeling, will stop IVF and give one dose of lasix to see if it helps edema.  2. Essential  hypertension: Continue lisinopril.  3. Vomiting: could be due to narcotics, will stop and monitor.  4. Multinodular goiter, non-toxic - followed as an outpt by Dr Gabriel Carina  5. 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 History of hypokalemia: resolved     All the records are reviewed and case discussed with Care Management/Social Worker. Management plans discussed with the patient, family and they are in agreement.  CODE STATUS: FULL CODE  TOTAL TIME TAKING CARE OF THIS PATIENT: 35 minutes.   More than 50% of the time was spent in counseling/coordination of care: YES  POSSIBLE D/C IN 1 DAYS, DEPENDING ON CLINICAL CONDITION.   [facility], Jaleigha Deane M.D on 10/05/2015 at 9:32 AM  Between 7am to 6pm - Pager - [phone]  After 6pm go to [email or web address] - [employer] [place] [facility]  Office  [phone]  CC: Primary care physician; [facility]  Note: This dictation was prepared with Dragon dictation along with smaller phrase technology. Any transcriptional errors that result from this process are unintentional.

## 2015-10-05 NOTE — Progress Notes (Signed)
Received report on pt, assumed care. Pt received tylenol po for c/o headache, and gingerale for mild nausea. Pt is oob to chair with legs dependent at this time, stated she is comfortable. Call bell in reach.

## 2015-10-06 MED ORDER — CLINDAMYCIN HCL 150 MG PO CAPS
300.0000 mg | ORAL_CAPSULE | Freq: Three times a day (TID) | ORAL | Status: DC
  Administered 2015-10-06 – 2015-10-07 (×3): 300 mg via ORAL
  Filled 2015-10-06 (×3): qty 2

## 2015-10-06 MED ORDER — FUROSEMIDE 10 MG/ML IJ SOLN
40.0000 mg | Freq: Once | INTRAMUSCULAR | Status: AC
  Administered 2015-10-06: 40 mg via INTRAVENOUS
  Filled 2015-10-06: qty 4

## 2015-10-06 NOTE — Plan of Care (Signed)
Problem: Bowel/Gastric: Goal: Will not experience complications related to bowel motility Outcome: Progressing Pt continues to progress toward discharge, has remained free of falls/injury this shift.

## 2015-10-06 NOTE — Progress Notes (Signed)
[place] at [place] NAME: Janet [name]    MR#:  [id number]  DATE OF BIRTH:  10-16-1950  SUBJECTIVE:  CHIEF COMPLAINT:   Chief Complaint  Patient presents with  . Leg Pain  still c/o pressure in leg (has swelling). REVIEW OF SYSTEMS:  Review of Systems  Constitutional: Positive for malaise/fatigue. Negative for chills, fever and weight loss.  HENT: Negative for nosebleeds and sore throat.   Eyes: Negative for blurred vision.  Respiratory: Negative for cough, shortness of breath and wheezing.   Cardiovascular: Positive for leg swelling. Negative for chest pain, orthopnea and PND.  Gastrointestinal: Negative for abdominal pain, constipation, diarrhea, heartburn, nausea and vomiting.  Genitourinary: Negative for dysuria and urgency.  Musculoskeletal: Negative for back pain.  Skin: Negative for rash.  Neurological: Negative for dizziness, speech change, focal weakness and headaches.  Endo/Heme/Allergies: Does not bruise/bleed easily.  Psychiatric/Behavioral: Negative for depression.   DRUG ALLERGIES:   Allergies  Allergen Reactions  . Shellfish Allergy Anaphylaxis  . Penicillins Hives and Other (See Comments)    Has patient had a PCN reaction causing immediate rash, facial/tongue/throat swelling, SOB or lightheadedness with hypotension: no Has patient had a PCN reaction causing severe rash involving mucus membranes or skin necrosis: no Has patient had a PCN reaction that required hospitalization? no Has patient had a PCN reaction occurring within the last 10 years: no If all of the above answers are "NO", then may proceed with Cephalosporin use.    VITALS:  Blood pressure 135/67, pulse 72, temperature 98 F (36.7 C), temperature source Oral, resp. rate 18, height 5\' 7"  (1.702 m), weight 98 kg (216 lb), SpO2 99 %. PHYSICAL EXAMINATION:  Physical Exam  Constitutional: She is oriented to person, place, and time and well-developed,  well-nourished, and in no distress.  HENT:  Head: Normocephalic and atraumatic.  Eyes: Conjunctivae and EOM are normal. Pupils are equal, round, and reactive to light.  Neck: Normal range of motion. Neck supple. No tracheal deviation present. No thyromegaly present.  Cardiovascular: Normal rate, regular rhythm and normal heart sounds.   Pulmonary/Chest: Effort normal and breath sounds normal. No respiratory distress. She has no wheezes. She exhibits no tenderness.  Abdominal: Soft. Bowel sounds are normal. She exhibits no distension. There is no tenderness.  Musculoskeletal: Normal range of motion.  Neurological: She is alert and oriented to person, place, and time. No cranial nerve deficit.  Skin: Skin is warm and dry. No rash noted.  dark discoloration consistent with chronic venous stasis changes in the bilateral lower extremities from the mid calf to the feet, left greater than right. There is mild tenderness to palpation (pressure feeling)  Psychiatric: Mood and affect normal.   LABORATORY PANEL:   CBC  Recent Labs Lab 10/04/15 0531  WBC 6.8  HGB 12.1  HCT 35.2  PLT 280   ------------------------------------------------------------------------------------------------------------------ Chemistries   Recent Labs Lab 10/05/15 0349  NA 139  K 4.0  CL 112*  CO2 23  GLUCOSE 106*  BUN 17  CREATININE 0.75  CALCIUM 8.9   RADIOLOGY:  No results found. ASSESSMENT AND PLAN:  65 year old female with chronic Venous stasis changes admitted with left lower extremity cellulitis.  1. Left lower extremity cellulitis: CT shows no evidence of abscess. - Continue PO clindamycin. - d/w pt importance of elevating leg on 2-3 pillows while in bed. - Stop norco - she is positive 1.3 liter fluid balance. Will try lasix 40 mg once again  2. Essential hypertension: Continue lisinopril.  3. Vomiting: could be due to narcotics, will stop and monitor.  4. Multinodular goiter,  non-toxic - followed as an outpt by Dr Gabriel Carina  5. 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 History of hypokalemia: resolved     All the records are reviewed and case discussed with Care Management/Social Worker. Management plans discussed with the patient, family, nursing and they are in agreement.  CODE STATUS: FULL CODE  TOTAL TIME TAKING CARE OF THIS PATIENT: 35 minutes.   More than 50% of the time was spent in counseling/coordination of care: YES  POSSIBLE D/C IN AM, DEPENDING ON CLINICAL CONDITION.   [facility], Draven Natter M.D on 10/06/2015 at 11:51 AM  Between 7am to 6pm - Pager - [phone]  After 6pm go to [email or web address] - [employer] bre[employer] [facility]  Office  [phone]  CC: Primary care physician; [facility]  Note: This dictation was prepared with Dragon dictation along with smaller phrase technology. Any transcriptional errors that result from this process are unintentional.

## 2015-10-06 NOTE — Progress Notes (Signed)
Pt received dulcolax po at thsi time for c/o constipation. Pt stated her las bm was yesterday, but that her stomach hurts a little and she feels it would be relived with bm.

## 2015-10-06 NOTE — Progress Notes (Signed)
PHARMACIST - PHYSICIAN COMMUNICATION DR:   Manuella Ghazi CONCERNING: Antibiotic IV to Oral Route Change Policy  RECOMMENDATION: This patient is receiving Clindamycin by the intravenous route.  Based on criteria approved by the Pharmacy and Therapeutics Committee, the antibiotic is being converted to the equivalent oral dose form.   DESCRIPTION: These criteria include:  Patient being treated for a respiratory tract infection, urinary tract infection, cellulitis or clostridium difficile associated diarrhea if on metronidazole  The patient is not neutropenic and does not exhibit a GI malabsorption state  The patient is eating (either orally or via tube) and/or has been taking other orally administered medications for a least 24 hours  The patient is improving clinically and has a Tmax < 100.5  If you have questions about this conversion, please contact the [place], PharmD 7:30 AM 10/06/2015

## 2015-10-06 NOTE — Progress Notes (Signed)
Shift assessment completed at 0745, pt is oob to recliner at bedside with feet dependent. Pt is alert and oriented, in no distress, stated she was hoping for d/c today. Pt is on room air, lungs are clear bilat, S1S2 heard, Abdomen is soft, bs heard. Pt denied difficulty voiding. Ppp. Pt's bilat le's are discolored from venous stasis, on her l leg near the knee is an old outline of the edema she was admitted with, some of this remains, leg is tender to palpation, and pt stated that her leg still feels tight. This writer explained to pt that if she can raise her legs in the recliner, this will help decrease edema. Ppp. PIV #20 intact to bilat arms, sites are free of redness and swelling. Dr/ rounded on pt and he and pt agreed to another dise of lasix ivp today with d/c home tomorrow. Pt remains oob to chair with her feet dependent, states she has voided 4 times since receiving lasix, and has had no complaints of pain. Call bell inreach.

## 2015-10-07 NOTE — Progress Notes (Signed)
  October 07, 2015  Patient: Janet [name]  Date of Birth: [birth year]  Date of Visit: 10/03/2015    To Whom It May Concern:  Wintress Brys was seen and treated in our emergency department or urgent care center on 10/03/2015 through 10/07/2015. TYRIELLE VALLAS  may return to work on 10/10/2015.  Sincerely, [facility] [place], [place] for Dr. Manuella Ghazi

## 2015-10-07 NOTE — Discharge Instructions (Signed)
Cellulitis °Cellulitis is an infection of the skin and the tissue under the skin. The infected area is usually red and tender. This happens most often in the arms and lower legs. °HOME CARE  °· Take your antibiotic medicine as told. Finish the medicine even if you start to feel better. °· Keep the infected arm or leg raised (elevated). °· Put a warm cloth on the area up to 4 times per day. °· Only take medicines as told by your doctor. °· Keep all doctor visits as told. °GET HELP IF: °· You see red streaks on the skin coming from the infected area. °· Your red area gets bigger or turns a dark color. °· Your bone or joint under the infected area is painful after the skin heals. °· Your infection comes back in the same area or different area. °· You have a puffy (swollen) bump in the infected area. °· You have new symptoms. °· You have a fever. °GET HELP RIGHT AWAY IF:  °· You feel very sleepy. °· You throw up (vomit) or have watery poop (diarrhea). °· You feel sick and have muscle aches and pains. °  °This information is not intended to replace advice given to you by your health care provider. Make sure you discuss any questions you have with your health care provider. °  °Document Released: 06/20/2007 Document Revised: 09/22/2014 Document Reviewed: 03/19/2011 °Elsevier Interactive Patient Education ©2016 [employer] Inc. ° °

## 2015-10-07 NOTE — Progress Notes (Signed)
[place] was admitted to the [facility] on 10/03/2015 and Discharged  10/07/2015 and should be excused from work  for 6-7 days starting 10/03/2015 , may return to work without any restrictions starting 10/08/2015  [facility] M.D on 10/07/2015,at 9:07 AM  [place] at [facility]  [phone]

## 2015-10-07 NOTE — Discharge Summary (Signed)
[place] at [place] NAME: Janet [name]    MR#:  [id number]  DATE OF BIRTH:  08/22/1950  DATE OF ADMISSION:  10/03/2015   ADMITTING PHYSICIAN: Ubaldo Glassing Hugelmeyer, DO  DATE OF DISCHARGE: 10/07/2015  8:04 AM  PRIMARY CARE PHYSICIAN: Stella   ADMISSION DIAGNOSIS:  Nausea without vomiting [R11.0] Cellulitis of lower extremity, unspecified laterality [id number] DISCHARGE DIAGNOSIS:  Active Problems:   Bilateral cellulitis of lower leg  SECONDARY DIAGNOSIS:   Past Medical History:  Diagnosis Date  . Asthma   . Cancer ([place])    kidney  . Hypertension    HOSPITAL COURSE:  65 year old female with chronic Venous stasis changes admitted with left lower extremity cellulitis.  1. Left lower extremity cellulitis: CT shows no evidence of abscess. - Resolved with Abx - was diuresed with lasix due to some leg edema and pressure feeling  2. Essential hypertension: Continue lisinopril.  3. Vomiting: could be due to narcotics, resolved after stopping narcotics  4. Multinodular goiter, non-toxic - followed as an outpt by Dr Gabriel Carina  5. 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 History of hypokalemia: resolved  DISCHARGE CONDITIONS:  STABLE CONSULTS OBTAINED:   DRUG ALLERGIES:   Allergies  Allergen Reactions  . Shellfish Allergy Anaphylaxis  . Penicillins Hives and Other (See Comments)    Has patient had a PCN reaction causing immediate rash, facial/tongue/throat swelling, SOB or lightheadedness with hypotension: no Has patient had a PCN reaction causing severe rash involving mucus membranes or skin necrosis: no Has patient had a PCN reaction that required hospitalization? no Has patient had a PCN reaction occurring within the last 10 years: no If all of the above answers are "NO", then may proceed with Cephalosporin use.    DISCHARGE MEDICATIONS:     Medication List    TAKE these  medications   albuterol 108 (90 Base) MCG/ACT inhaler Commonly known as:  PROVENTIL HFA;VENTOLIN HFA Inhale 2 puffs into the lungs every 6 (six) hours as needed for wheezing or shortness of breath.   aspirin EC 81 MG tablet Take 81 mg by mouth daily.   lisinopril 40 MG tablet Commonly known as:  PRINIVIL,ZESTRIL Take 1 tablet by mouth daily.   potassium chloride SA 20 MEQ tablet Commonly known as:  K-DUR,KLOR-CON Take 20 mEq by mouth 2 (two) times daily.        DISCHARGE INSTRUCTIONS:   DIET:  Regular diet DISCHARGE CONDITION:  Good ACTIVITY:  Activity as tolerated OXYGEN:  Home Oxygen: No.  Oxygen Delivery: room air DISCHARGE LOCATION:  home   If you experience worsening of your admission symptoms, develop shortness of breath, life threatening emergency, suicidal or homicidal thoughts you must seek medical attention immediately by calling 911 or calling your MD immediately  if symptoms less severe.  You Must read complete instructions/literature along with all the possible adverse reactions/side effects for all the Medicines you take and that have been prescribed to you. Take any new Medicines after you have completely understood and accpet all the possible adverse reactions/side effects.   Please note  You were cared for by a hospitalist during your hospital stay. If you have any questions about your discharge medications or the care you received while you were in the hospital after you are discharged, you can call the unit and asked to speak with the hospitalist on call if the hospitalist that took care of you is not  available. Once you are discharged, your primary care physician will handle any further medical issues. Please note that NO REFILLS for any discharge medications will be authorized once you are discharged, as it is imperative that you return to your primary care physician (or establish a relationship with a primary care physician if you do not have one) for  your aftercare needs so that they can reassess your need for medications and monitor your lab values.    On the day of Discharge:  VITAL SIGNS:  Blood pressure 140/71, pulse 97, temperature 98.3 F (36.8 C), temperature source Oral, resp. rate 18, height 5\' 7"  (1.702 m), weight 98 kg (216 lb), SpO2 100 %. PHYSICAL EXAMINATION:  GENERAL:  65 y.o.-year-old patient lying in the bed with no acute distress.  EYES: Pupils equal, round, reactive to light and accommodation. No scleral icterus. Extraocular muscles intact.  HEENT: Head atraumatic, normocephalic. Oropharynx and nasopharynx clear.  NECK:  Supple, no jugular venous distention. No thyroid enlargement, no tenderness.  LUNGS: Normal breath sounds bilaterally, no wheezing, rales,rhonchi or crepitation. No use of accessory muscles of respiration.  CARDIOVASCULAR: S1, S2 normal. No murmurs, rubs, or gallops.  ABDOMEN: Soft, non-tender, non-distended. Bowel sounds present. No organomegaly or mass.  EXTREMITIES: No pedal edema, cyanosis, or clubbing.  NEUROLOGIC: Cranial nerves II through XII are intact. Muscle strength 5/5 in all extremities. Sensation intact. Gait not checked.  PSYCHIATRIC: The patient is alert and oriented x 3.  SKIN: No obvious rash, lesion, or ulcer.  DATA REVIEW:   CBC  Recent Labs Lab 10/04/15 0531  WBC 6.8  HGB 12.1  HCT 35.2  PLT 280    Chemistries   Recent Labs Lab 10/05/15 0349  NA 139  K 4.0  CL 112*  CO2 23  GLUCOSE 106*  BUN 17  CREATININE 0.75  CALCIUM 8.9     Follow-up Information    [facility]. Schedule an appointment as soon as possible for a visit in 1 week(s).   Specialty:  General Practice Contact information: [address] [place] [address] [phone]            Management plans discussed with the patient, family and they are in agreement.  CODE STATUS: FULL CODE  TOTAL TIME TAKING CARE OF THIS PATIENT: 45 minutes.    [facility], Cobi Aldape  M.D on 10/07/2015 at 6:42 PM  Between 7am to 6pm - Pager - [phone]  After 6pm go to [email or web address] - [employer] [facility]  Office  [phone]  CC: Primary care physician; [facility]   Note: This dictation was prepared with Dragon dictation along with smaller phrase technology. Any transcriptional errors that result from this process are unintentional.

## 2015-10-07 NOTE — Progress Notes (Signed)
Patient just got d/c, vs wnl, no ss of distress at time of d/c. Family at bedside during d/c, went over d/c instructions. No concerns at this time. VS wnl as charted.

## 2015-10-08 LAB — CULTURE, BLOOD (ROUTINE X 2)
CULTURE: NO GROWTH
Culture: NO GROWTH

## 2015-10-28 ENCOUNTER — Encounter: Payer: Self-pay | Admitting: *Deleted

## 2015-10-28 ENCOUNTER — Ambulatory Visit
Admission: EM | Admit: 2015-10-28 | Discharge: 2015-10-28 | Disposition: A | Discharge status: Home / Self Care | Payer: Medicare Other | Attending: Family Medicine | Admitting: Family Medicine

## 2015-10-28 DIAGNOSIS — L03116 Cellulitis of left lower limb: Secondary | ICD-10-CM | POA: Diagnosis not present

## 2015-10-28 MED ORDER — SULFAMETHOXAZOLE-TRIMETHOPRIM 800-160 MG PO TABS: 1.0000 | ORAL_TABLET | Freq: Two times a day (BID) | ORAL | 0 refills | Status: DC

## 2015-10-28 NOTE — ED Provider Notes (Signed)
[facility]    CSN: [id number] Arrival date & time: 10/28/15  1553     History   Chief Complaint Chief Complaint  Patient presents with  . Leg Pain    HPI Janet [name] is a 65 y.o. female.   65 yo female was recently admitted to [facility] hospital for treatment of cellulitis of left leg. The pain burning pain returned to her left leg soon after being released from the hospital. Today the patient is complaining of burning her left leg with similar symptoms occurring on her right leg. Denies any fevers, chills or drainage.    The history is provided by the patient.  Leg Pain    Past Medical History:  Diagnosis Date  . Asthma   . Cancer ([place])    kidney  . Hypertension     Patient Active Problem List   Diagnosis Date Noted  . Bilateral cellulitis of lower leg 10/03/2015  . Generalized weakness 09/09/2014  . 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 ABDOMINAL HYSTERECTOMY      OB History    Gravida Para Term Preterm AB Living   3         2   SAB TAB Ectopic Multiple Live Births                   Home Medications    Prior to Admission medications   Medication Sig Start Date End Date Taking? Authorizing Provider  albuterol (PROVENTIL HFA;VENTOLIN HFA) 108 (90 BASE) MCG/ACT inhaler Inhale 2 puffs into the lungs every 6 (six) hours as needed for wheezing or shortness of breath. 12/24/14  Yes [name], MD  aspirin EC 81 MG tablet Take 81 mg by mouth daily.   Yes Historical Provider, MD  lisinopril (PRINIVIL,ZESTRIL) 40 MG tablet Take 1 tablet by mouth daily. 08/27/14  Yes Historical Provider, MD  potassium chloride SA (K-DUR,KLOR-CON) 20 MEQ tablet Take 20 mEq by mouth 2 (two) times daily.   Yes Historical Provider, MD  sulfamethoxazole-trimethoprim (BACTRIM DS,SEPTRA DS) 800-160 MG tablet Take 1 tablet by mouth 2 (two) times daily. 10/28/15   Norval Gable, MD    Family History Family History  Problem  Relation Age of Onset  . Stroke Neg Hx     Social History Social History  Substance Use Topics  . Smoking status: Never Smoker  . Smokeless tobacco: Never Used  . Alcohol use No     Allergies   Shellfish allergy and Penicillins   Review of Systems Review of Systems   Physical Exam Triage Vital Signs ED Triage Vitals  Enc Vitals Group     BP 10/28/15 1638 (!) 159/70     Pulse Rate 10/28/15 1638 74     Resp 10/28/15 1638 18     Temp 10/28/15 1638 98.1 F (36.7 C)     Temp Source 10/28/15 1638 Oral     SpO2 10/28/15 1638 98 %     Weight 10/28/15 1639 216 lb (98 kg)     Height 10/28/15 1639 5\' 7"  (1.702 m)     Head Circumference --      Peak Flow --      Pain Score 10/28/15 1645 7     Pain Loc --      Pain Edu? --      Excl. in [place]? --    No data found.   Updated Vital Signs BP (!) 159/70 (BP Location: Left  Arm)   Pulse 74   Temp 98.1 F (36.7 C) (Oral)   Resp 18   Ht 5\' 7"  (1.702 m)   Wt 216 lb (98 kg)   SpO2 98%   BMI 33.83 kg/m   Visual Acuity Right Eye Distance:   Left Eye Distance:   Bilateral Distance:    Right Eye Near:   Left Eye Near:    Bilateral Near:     Physical Exam  Constitutional: She appears well-developed and well-nourished. No distress.  Skin: Skin is warm. She is not diaphoretic. There is erythema.  Left anterior lower leg (shin) area with blanchable erythema, warmth and tenderness to palpation  Nursing note and vitals reviewed.    UC Treatments / Results  Labs (all labs ordered are listed, but only abnormal results are displayed) Labs Reviewed - No data to display  EKG  EKG Interpretation None       Radiology No results found.  Procedures Procedures (including critical care time)  Medications Ordered in UC Medications - No data to display   Initial Impression / Assessment and Plan / UC Course  I have reviewed the triage vital signs and the nursing notes.  Pertinent labs & imaging results that were  available during my care of the patient were reviewed by me and considered in my medical decision making (see chart for details).  Clinical Course      Final Clinical Impressions(s) / UC Diagnoses   Final diagnoses:  Cellulitis of left lower extremity    New Prescriptions Discharge Medication List as of 10/28/2015  5:24 PM    START taking these medications   Details  sulfamethoxazole-trimethoprim (BACTRIM DS,SEPTRA DS) 800-160 MG tablet Take 1 tablet by mouth 2 (two) times daily., Starting Fri 10/28/2015, Normal        1. diagnosis reviewed with patient 2. rx as per orders above; reviewed possible side effects, interactions, risks and benefits  3. Recommend supportive treatment with warm compresses 4. Follow-up prn if symptoms worsen or don't improve   Norval Gable, MD 10/28/15 2150

## 2015-10-28 NOTE — ED Triage Notes (Signed)
Patient was recently admitted to [facility] hospital for treatment of cellulitis of left leg. The pain burning pain returned to her left leg soon after being released from the hospital. Today the patient is complaining of burning her left leg with similar symptoms occurring on her right leg.

## 2016-01-30 ENCOUNTER — Ambulatory Visit
Admission: EM | Admit: 2016-01-30 | Discharge: 2016-01-30 | Disposition: A | Discharge status: Home / Self Care | Payer: Medicare Other | Attending: Family Medicine | Admitting: Family Medicine

## 2016-01-30 DIAGNOSIS — Z79899 Other long term (current) drug therapy: Secondary | ICD-10-CM | POA: Diagnosis not present

## 2016-01-30 DIAGNOSIS — Z91013 Allergy to seafood: Secondary | ICD-10-CM | POA: Insufficient documentation

## 2016-01-30 DIAGNOSIS — Z7982 Long term (current) use of aspirin: Secondary | ICD-10-CM | POA: Diagnosis not present

## 2016-01-30 DIAGNOSIS — X58XXXA Exposure to other specified factors, initial encounter: Secondary | ICD-10-CM | POA: Insufficient documentation

## 2016-01-30 DIAGNOSIS — Z880 Allergy status to penicillin: Secondary | ICD-10-CM | POA: Diagnosis not present

## 2016-01-30 DIAGNOSIS — M545 Low back pain: Secondary | ICD-10-CM | POA: Diagnosis present

## 2016-01-30 DIAGNOSIS — S39012A Strain of muscle, fascia and tendon of lower back, initial encounter: Secondary | ICD-10-CM | POA: Diagnosis not present

## 2016-01-30 LAB — URINALYSIS, COMPLETE (UACMP) WITH MICROSCOPIC
BACTERIA UA: NONE SEEN
Bilirubin Urine: NEGATIVE
GLUCOSE, UA: NEGATIVE mg/dL
Ketones, ur: NEGATIVE mg/dL
Leukocytes, UA: NEGATIVE
Nitrite: NEGATIVE
PH: 6 (ref 5.0–8.0)
Protein, ur: NEGATIVE mg/dL
Specific Gravity, Urine: 1.015 (ref 1.005–1.030)

## 2016-01-30 MED ORDER — CYCLOBENZAPRINE HCL 10 MG PO TABS: 10.0000 mg | ORAL_TABLET | Freq: Every day | ORAL | 0 refills | Status: DC

## 2016-01-30 NOTE — ED Triage Notes (Signed)
Patient complains of low back pain. Patient states that pain started this morning. Patient states that she has an aching sensation in her lower back.

## 2016-01-30 NOTE — ED Provider Notes (Signed)
[facility]    CSN: [id number] Arrival date & time: 01/30/16  1909     History   Chief Complaint Chief Complaint  Patient presents with  . Back Pain    HPI Janet [name] is a 66 y.o. female.   The history is provided by the patient.  Back Pain  Location:  Lumbar spine Quality:  Aching Radiates to:  Does not radiate Pain severity:  Moderate Onset quality:  Sudden Duration:  12 hours Timing:  Constant Progression:  Unchanged Chronicity:  New Context: not emotional stress, not falling, not jumping from heights, not lifting heavy objects, not MCA, not MVA, not occupational injury, not pedestrian accident, not physical stress, not recent illness, not recent injury and not twisting   Relieved by:  None tried Associated symptoms: no abdominal pain, no abdominal swelling, no bladder incontinence, no bowel incontinence, no chest pain, no dysuria, no fever, no headaches, no leg pain, no numbness, no paresthesias, no pelvic pain, no perianal numbness, no tingling, no weakness and no weight loss   Risk factors: no recent surgery and no steroid use     Past Medical History:  Diagnosis Date  . Asthma   . Cancer ([place])    kidney  . Hypertension     Patient Active Problem List   Diagnosis Date Noted  . Bilateral cellulitis of lower leg 10/03/2015  . Generalized weakness 09/09/2014  . 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 ABDOMINAL HYSTERECTOMY      OB History    Gravida Para Term Preterm AB Living   3         2   SAB TAB Ectopic Multiple Live Births                   Home Medications    Prior to Admission medications   Medication Sig Start Date End Date Taking? Authorizing Provider  albuterol (PROVENTIL HFA;VENTOLIN HFA) 108 (90 BASE) MCG/ACT inhaler Inhale 2 puffs into the lungs every 6 (six) hours as needed for wheezing or shortness of breath. 12/24/14  Yes [name], MD  aspirin EC 81 MG tablet  Take 81 mg by mouth daily.   Yes Historical Provider, MD  lisinopril (PRINIVIL,ZESTRIL) 40 MG tablet Take 1 tablet by mouth daily. 08/27/14  Yes Historical Provider, MD  potassium chloride SA (K-DUR,KLOR-CON) 20 MEQ tablet Take 20 mEq by mouth 2 (two) times daily.   Yes Historical Provider, MD  cyclobenzaprine (FLEXERIL) 10 MG tablet Take 1 tablet (10 mg total) by mouth at bedtime. 01/30/16   Norval Gable, MD  sulfamethoxazole-trimethoprim (BACTRIM DS,SEPTRA DS) 800-160 MG tablet Take 1 tablet by mouth 2 (two) times daily. 10/28/15   Norval Gable, MD    Family History Family History  Problem Relation Age of Onset  . Stroke Neg Hx     Social History Social History  Substance Use Topics  . Smoking status: Never Smoker  . Smokeless tobacco: Never Used  . Alcohol use No     Allergies   Shellfish allergy and Penicillins   Review of Systems Review of Systems  Constitutional: Negative for fever and weight loss.  Cardiovascular: Negative for chest pain.  Gastrointestinal: Negative for abdominal pain and bowel incontinence.  Genitourinary: Negative for bladder incontinence, dysuria and pelvic pain.  Musculoskeletal: Positive for back pain.  Neurological: Negative for tingling, weakness, numbness, headaches and paresthesias.     Physical Exam Triage Vital Signs ED Triage  Vitals  Enc Vitals Group     BP 01/30/16 1931 (!) 156/71     Pulse Rate 01/30/16 1931 74     Resp 01/30/16 1931 16     Temp 01/30/16 1931 98 F (36.7 C)     Temp Source 01/30/16 1931 Oral     SpO2 01/30/16 1931 100 %     Weight 01/30/16 1930 212 lb (96.2 kg)     Height 01/30/16 1930 5\' 9"  (1.753 m)     Head Circumference --      Peak Flow --      Pain Score 01/30/16 1931 8     Pain Loc --      Pain Edu? --      Excl. in [place]? --    No data found.   Updated Vital Signs BP (!) 156/71 (BP Location: Left Arm)   Pulse 74   Temp 98 F (36.7 C) (Oral)   Resp 16   Ht 5\' 9"  (1.753 m)   Wt 212 lb (96.2  kg)   SpO2 100%   BMI 31.31 kg/m   Visual Acuity Right Eye Distance:   Left Eye Distance:   Bilateral Distance:    Right Eye Near:   Left Eye Near:    Bilateral Near:     Physical Exam  Constitutional: She appears well-developed and well-nourished. No distress.  Musculoskeletal: She exhibits tenderness (over the left lumbar paraspinous muscles ). She exhibits no edema.       Lumbar back: She exhibits tenderness and spasm. She exhibits normal range of motion, no bony tenderness, no swelling, no edema, no deformity, no laceration, no pain and normal pulse.  Neurological: She is alert. She has normal reflexes. She displays normal reflexes. She exhibits normal muscle tone.  Skin: Skin is warm and dry. No rash noted. She is not diaphoretic. No erythema.  Nursing note and vitals reviewed.    UC Treatments / Results  Labs (all labs ordered are listed, but only abnormal results are displayed) Labs Reviewed  URINALYSIS, COMPLETE (UACMP) WITH MICROSCOPIC - Abnormal; Notable for the following:       Result Value   Hgb urine dipstick SMALL (*)    Squamous Epithelial / LPF 0-5 (*)    All other components within normal limits    EKG  EKG Interpretation None       Radiology No results found.  Procedures Procedures (including critical care time)  Medications Ordered in UC Medications - No data to display   Initial Impression / Assessment and Plan / UC Course  I have reviewed the triage vital signs and the nursing notes.  Pertinent labs & imaging results that were available during my care of the patient were reviewed by me and considered in my medical decision making (see chart for details).  Clinical Course       Final Clinical Impressions(s) / UC Diagnoses   Final diagnoses:  Strain of lumbar region, initial encounter    New Prescriptions Discharge Medication List as of 01/30/2016  8:09 PM    START taking these medications   Details  cyclobenzaprine (FLEXERIL)  10 MG tablet Take 1 tablet (10 mg total) by mouth at bedtime., Starting Mon 01/30/2016, Normal       1.  diagnosis reviewed with patient 2. rx as per orders above; reviewed possible side effects, interactions, risks and benefits  3. Recommend supportive treatment with heat to area 4. Follow-up prn if symptoms worsen or don't improve  Norval Gable, MD 01/30/16 2040

## 2016-02-12 ENCOUNTER — Emergency Department
Admission: EM | Admit: 2016-02-12 | Discharge: 2016-02-12 | Disposition: A | Discharge status: Home / Self Care | Payer: Medicare Other | Attending: Emergency Medicine | Admitting: Emergency Medicine

## 2016-02-12 ENCOUNTER — Emergency Department: Payer: Medicare Other

## 2016-02-12 DIAGNOSIS — Z85528 Personal history of other malignant neoplasm of kidney: Secondary | ICD-10-CM | POA: Diagnosis not present

## 2016-02-12 DIAGNOSIS — I10 Essential (primary) hypertension: Secondary | ICD-10-CM | POA: Insufficient documentation

## 2016-02-12 DIAGNOSIS — R109 Unspecified abdominal pain: Secondary | ICD-10-CM | POA: Diagnosis not present

## 2016-02-12 DIAGNOSIS — J45909 Unspecified asthma, uncomplicated: Secondary | ICD-10-CM | POA: Diagnosis not present

## 2016-02-12 DIAGNOSIS — Z7982 Long term (current) use of aspirin: Secondary | ICD-10-CM | POA: Insufficient documentation

## 2016-02-12 DIAGNOSIS — M545 Low back pain, unspecified: Secondary | ICD-10-CM

## 2016-02-12 DIAGNOSIS — Z79899 Other long term (current) drug therapy: Secondary | ICD-10-CM | POA: Diagnosis not present

## 2016-02-12 LAB — BASIC METABOLIC PANEL
Anion gap: 8 (ref 5–15)
BUN: 18 mg/dL (ref 6–20)
CO2: 25 mmol/L (ref 22–32)
CREATININE: 0.78 mg/dL (ref 0.44–1.00)
Calcium: 9.3 mg/dL (ref 8.9–10.3)
Chloride: 105 mmol/L (ref 101–111)
GFR calc non Af Amer: >60 mL/min (ref >60)
GLUCOSE: 98 mg/dL (ref 65–99)
Potassium: 3.8 mmol/L (ref 3.5–5.1)
Sodium: 138 mmol/L (ref 135–145)

## 2016-02-12 LAB — URINALYSIS, COMPLETE (UACMP) WITH MICROSCOPIC
Bacteria, UA: NONE SEEN
Bilirubin Urine: NEGATIVE
GLUCOSE, UA: NEGATIVE mg/dL
HGB URINE DIPSTICK: NEGATIVE
Ketones, ur: NEGATIVE mg/dL
Leukocytes, UA: NEGATIVE
NITRITE: NEGATIVE
PH: 6 (ref 5.0–8.0)
PROTEIN: NEGATIVE mg/dL
Specific Gravity, Urine: 1.019 (ref 1.005–1.030)

## 2016-02-12 LAB — CBC
HEMATOCRIT: 37.6 % (ref 35.0–47.0)
Hemoglobin: 12.9 g/dL (ref 12.0–16.0)
MCH: 29.3 pg (ref 26.0–34.0)
MCHC: 34.3 g/dL (ref 32.0–36.0)
MCV: 85.4 fL (ref 80.0–100.0)
Platelets: 314 10*3/uL (ref 150–440)
RBC: 4.41 MIL/uL (ref 3.80–5.20)
RDW: 13.8 % (ref 11.5–14.5)
WBC: 6.9 10*3/uL (ref 3.6–11.0)

## 2016-02-12 MED ORDER — OXYCODONE-ACETAMINOPHEN 5-325 MG PO TABS
1.0000 | ORAL_TABLET | Freq: Once | ORAL | Status: AC
  Administered 2016-02-12: 1 via ORAL
  Filled 2016-02-12: qty 1

## 2016-02-12 MED ORDER — OXYCODONE-ACETAMINOPHEN 5-325 MG PO TABS: 1.0000 | ORAL_TABLET | Freq: Four times a day (QID) | ORAL | 0 refills | Status: DC | PRN

## 2016-02-12 NOTE — ED Notes (Signed)
Pt states left lower back pain that radiates down posterior left leg and wraps around to left groin. Pt denies fever, nausea, vomiting, constipation, chills, dysuria or known hematuria. Pt states pain is worse with movement and ambulation. Cms intact to bilateral feet.

## 2016-02-12 NOTE — ED Notes (Signed)
Patient transported to CT 

## 2016-02-12 NOTE — ED Provider Notes (Signed)
[facility] Emergency Department Provider Note   ____________________________________________   First MD Initiated Contact with Patient 02/12/16 [phone]     (approximate)  I have reviewed the triage vital signs and the nursing notes.   HISTORY  Chief Complaint Back Pain    HPI Janet [name] is a 66 y.o. female who comes into the hospital today with some left-sided back pain. The patient reports that it started on Monday. It is radiating down her leg and going around her abdomen. She reports that she went to see has been taking Tylenol for the pain but it is not helping. The patient denies having pain with this before. She denies any pain with urination. She has no blood in her urine.The patient reports that her pain is 8-9 out of 10 in intensity. She reports it hurts more when she moves. She denies any falls or lifting or straining. She reports that she went to the walk-in clinic and was evaluated there. Her urine was negative and she was given a pill for muscle pain and spasm. She reports that nothing makes it better or worse. The patient couldn't tolerate the pain anymore so she decided to come into the hospital for evaluation.   Past Medical History:  Diagnosis Date  . Asthma   . Cancer ([place])    kidney  . Hypertension     Patient Active Problem List   Diagnosis Date Noted  . Bilateral cellulitis of lower leg 10/03/2015  . Generalized weakness 09/09/2014  . 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 ABDOMINAL HYSTERECTOMY      Prior to Admission medications   Medication Sig Start Date End Date Taking? Authorizing Provider  aspirin EC 81 MG tablet Take 81 mg by mouth daily.   Yes Historical Provider, MD  cyclobenzaprine (FLEXERIL) 10 MG tablet Take 1 tablet (10 mg total) by mouth at bedtime. 01/30/16  Yes Norval Gable, MD  lisinopril (PRINIVIL,ZESTRIL) 40 MG tablet Take 1 tablet by mouth daily. 08/27/14   Yes Historical Provider, MD  potassium chloride SA (K-DUR,KLOR-CON) 20 MEQ tablet Take 20 mEq by mouth 2 (two) times daily.   Yes Historical Provider, MD  albuterol (PROVENTIL HFA;VENTOLIN HFA) 108 (90 BASE) MCG/ACT inhaler Inhale 2 puffs into the lungs every 6 (six) hours as needed for wheezing or shortness of breath. 12/24/14   [name], MD  oxyCODONE-acetaminophen (ROXICET) 5-325 MG tablet Take 1 tablet by mouth every 6 (six) hours as needed. 02/12/16   Loney Hering, MD  sulfamethoxazole-trimethoprim (BACTRIM DS,SEPTRA DS) 800-160 MG tablet Take 1 tablet by mouth 2 (two) times daily. Patient not taking: Reported on 02/12/2016 10/28/15   Norval Gable, MD    Allergies Shellfish allergy and Penicillins  Family History  Problem Relation Age of Onset  . Stroke Neg Hx     Social History Social History  Substance Use Topics  . Smoking status: Never Smoker  . Smokeless tobacco: Never Used  . Alcohol use No    Review of Systems Constitutional: No fever/chills Eyes: No visual changes. ENT: No sore throat. Cardiovascular: Denies chest pain. Respiratory: Denies shortness of breath. Gastrointestinal: No abdominal pain.  No nausea, no vomiting.  No diarrhea.  No constipation. Genitourinary: Negative for dysuria. Musculoskeletal: Left flank pain Skin: Negative for rash. Neurological: Negative for headaches, focal weakness or numbness.  10-point ROS otherwise negative.  ____________________________________________   PHYSICAL EXAM:  VITAL SIGNS: ED Triage Vitals  Enc Vitals  Group     BP 02/12/16 0226 (!) 142/63     Pulse Rate 02/12/16 0226 78     Resp 02/12/16 0226 20     Temp 02/12/16 0226 97.7 F (36.5 C)     Temp Source 02/12/16 0226 Oral     SpO2 02/12/16 0226 98 %     Weight --      Height --      Head Circumference --      Peak Flow --      Pain Score 02/12/16 0227 9     Pain Loc --      Pain Edu? --      Excl. in [place]? --     Constitutional: Alert and  oriented. Well appearing and in mild distress. Eyes: Conjunctivae are normal. PERRL. EOMI. Head: Atraumatic. Nose: No congestion/rhinnorhea. Mouth/Throat: Mucous membranes are moist.  Oropharynx non-erythematous. Cardiovascular: Normal rate, regular rhythm. Grossly normal heart sounds.  Good peripheral circulation. Respiratory: Normal respiratory effort.  No retractions. Lungs CTAB. Gastrointestinal: Soft and nontender. No distention. Positive bowel sounds  Musculoskeletal: No lower extremity tenderness nor edema.  Mild left back tenderness to palpation, no pain to palpation on right side Neurologic:  Normal speech and language.  Skin:  Skin is warm, dry and intact.  Psychiatric: Mood and affect are normal.   ____________________________________________   LABS (all labs ordered are listed, but only abnormal results are displayed)  Labs Reviewed  URINALYSIS, COMPLETE (UACMP) WITH MICROSCOPIC - Abnormal; Notable for the following:       Result Value   Color, Urine YELLOW (*)    APPearance CLEAR (*)    Squamous Epithelial / LPF 0-5 (*)    All other components within normal limits  CBC  BASIC METABOLIC PANEL   ____________________________________________  EKG  none ____________________________________________  RADIOLOGY  CT renal stone ____________________________________________   PROCEDURES  Procedure(s) performed: None  Procedures  Critical Care performed: No  ____________________________________________   INITIAL IMPRESSION / ASSESSMENT AND PLAN / ED COURSE  Pertinent labs & imaging results that were available during my care of the patient were reviewed by me and considered in my medical decision making (see chart for details).  This is a 66 year old female who comes into the hospital today with some left-sided flank pain. I did a CT scan to evaluate for possible kidney stones given the patient's symptoms. The patient's CT scan does not show any stones but she  does have some renal lesions with a possible exophytic lesion to the left kidney. The patient also has a aneurysm right renal artery. I did explain this all to the patient. I give her some Percocet which helped her pain. I will have the patient follow back up with her primary care physician for further evaluation of her symptoms. The patient and her husband understand these plans. The patient be discharged home to follow-up with her primary care physician.  Clinical Course as of Feb 12 520  Sun Feb 12, 2016  0510 No hydronephrosis or nephrolithiasis.  Bilateral renal lesions as described appear stable compared to the chest CT of 04/23/2015. The right renal lesion likely represents a cyst. The left renal lesion is indeterminate. Although it may represent a complex/hemorrhagic cyst, a solid lesion is not excluded. Further evaluation with MRI to, if not previously done, is recommended.  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  Constipation. No bowel obstruction or  active inflammation.   CT Renal Laren Everts [AW]    Clinical Course User Index [AW] Loney Hering, MD     ____________________________________________   FINAL CLINICAL IMPRESSION(S) / ED DIAGNOSES  Final diagnoses:  Left flank pain  Acute left-sided low back pain without sciatica      NEW MEDICATIONS STARTED DURING THIS VISIT:  New Prescriptions   OXYCODONE-ACETAMINOPHEN (ROXICET) 5-325 MG TABLET    Take 1 tablet by mouth every 6 (six) hours as needed.     Note:  This document was prepared using Dragon voice recognition software and may include unintentional dictation errors.    Loney Hering, MD 02/12/16 [phone]

## 2016-02-12 NOTE — ED Triage Notes (Signed)
Patient reports lower back pain for approximately 1 week.  Reports seen at urgent care and dx'd with pulled muscle but states medication prescribed is not working.

## 2016-02-13 ENCOUNTER — Encounter: Payer: Self-pay | Admitting: Emergency Medicine

## 2016-02-13 ENCOUNTER — Emergency Department
Admission: EM | Admit: 2016-02-13 | Discharge: 2016-02-13 | Disposition: A | Discharge status: Home / Self Care | Payer: Medicare Other | Attending: Emergency Medicine | Admitting: Emergency Medicine

## 2016-02-13 DIAGNOSIS — J45909 Unspecified asthma, uncomplicated: Secondary | ICD-10-CM | POA: Diagnosis not present

## 2016-02-13 DIAGNOSIS — Z79899 Other long term (current) drug therapy: Secondary | ICD-10-CM | POA: Diagnosis not present

## 2016-02-13 DIAGNOSIS — M5116 Intervertebral disc disorders with radiculopathy, lumbar region: Secondary | ICD-10-CM | POA: Insufficient documentation

## 2016-02-13 DIAGNOSIS — I10 Essential (primary) hypertension: Secondary | ICD-10-CM | POA: Diagnosis not present

## 2016-02-13 DIAGNOSIS — M25551 Pain in right hip: Secondary | ICD-10-CM | POA: Diagnosis present

## 2016-02-13 DIAGNOSIS — Z7982 Long term (current) use of aspirin: Secondary | ICD-10-CM | POA: Diagnosis not present

## 2016-02-13 DIAGNOSIS — M5416 Radiculopathy, lumbar region: Secondary | ICD-10-CM

## 2016-02-13 MED ORDER — DEXAMETHASONE SODIUM PHOSPHATE 10 MG/ML IJ SOLN
INTRAMUSCULAR | Status: AC
  Filled 2016-02-13: qty 1

## 2016-02-13 MED ORDER — DEXAMETHASONE SODIUM PHOSPHATE 10 MG/ML IJ SOLN
10.0000 mg | Freq: Once | INTRAMUSCULAR | Status: AC
  Administered 2016-02-13: 10 mg via INTRAMUSCULAR

## 2016-02-13 MED ORDER — OXYCODONE-ACETAMINOPHEN 5-325 MG PO TABS
1.0000 | ORAL_TABLET | Freq: Once | ORAL | Status: AC
  Administered 2016-02-13: 1 via ORAL

## 2016-02-13 MED ORDER — OXYCODONE-ACETAMINOPHEN 5-325 MG PO TABS
ORAL_TABLET | ORAL | Status: AC
  Filled 2016-02-13: qty 1

## 2016-02-13 NOTE — ED Triage Notes (Signed)
Patient with complaint of left upper leg pain that started on Monday. Patient states that it starts in her hip and radiates to her knee. Patient describes it as a pulling pain. Patient states that she was seen here yesterday for the same and was told that it was a pulled muscle. Patient states that the pain medication she was given is not working.

## 2016-02-13 NOTE — ED Provider Notes (Signed)
[facility] Emergency Department Provider Note   ____________________________________________    I have reviewed the triage vital signs and the nursing notes.   HISTORY  Chief Complaint Leg Pain     HPI Janet [name] is a 66 y.o. female who presents with complaints of sided back/hip/leg pain. Patient was seen here yesterday for similar complaints. She had a CT scan which was unremarkable. No fevers or chills. No urinary complaints. No motor weakness. She reports the pain is sharp and electrical in nature.    Past Medical History:  Diagnosis Date  . Asthma   . Cancer ([place])    kidney  . Hypertension     Patient Active Problem List   Diagnosis Date Noted  . Bilateral cellulitis of lower leg 10/03/2015  . Generalized weakness 09/09/2014  . 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 ABDOMINAL HYSTERECTOMY      Prior to Admission medications   Medication Sig Start Date End Date Taking? Authorizing Provider  albuterol (PROVENTIL HFA;VENTOLIN HFA) 108 (90 BASE) MCG/ACT inhaler Inhale 2 puffs into the lungs every 6 (six) hours as needed for wheezing or shortness of breath. 12/24/14   [name], MD  aspirin EC 81 MG tablet Take 81 mg by mouth daily.    Historical Provider, MD  cyclobenzaprine (FLEXERIL) 10 MG tablet Take 1 tablet (10 mg total) by mouth at bedtime. 01/30/16   Norval Gable, MD  lisinopril (PRINIVIL,ZESTRIL) 40 MG tablet Take 1 tablet by mouth daily. 08/27/14   Historical Provider, MD  oxyCODONE-acetaminophen (ROXICET) 5-325 MG tablet Take 1 tablet by mouth every 6 (six) hours as needed. 02/12/16   Loney Hering, MD  potassium chloride SA (K-DUR,KLOR-CON) 20 MEQ tablet Take 20 mEq by mouth 2 (two) times daily.    Historical Provider, MD  sulfamethoxazole-trimethoprim (BACTRIM DS,SEPTRA DS) 800-160 MG tablet Take 1 tablet by mouth 2 (two) times daily. Patient not taking: Reported on  02/12/2016 10/28/15   Norval Gable, MD     Allergies Shellfish allergy and Penicillins  Family History  Problem Relation Age of Onset  . Stroke Neg Hx     Social History Social History  Substance Use Topics  . Smoking status: Never Smoker  . Smokeless tobacco: Never Used  . Alcohol use No    Review of Systems  Constitutional: No fever/chills     Gastrointestinal: No abdominal pain.  No nausea, no vomiting.   Genitourinary: Negative for dysuria. No incontinence Musculoskeletal: As above Skin: Negative for rash. Neurological: Negative for weakness    ____________________________________________   PHYSICAL EXAM:  VITAL SIGNS: ED Triage Vitals  Enc Vitals Group     BP 02/13/16 0428 126/68     Pulse Rate 02/13/16 0428 79     Resp 02/13/16 0428 18     Temp 02/13/16 0428 98.2 F (36.8 C)     Temp Source 02/13/16 0428 Oral     SpO2 02/13/16 0428 98 %     Weight 02/13/16 0429 212 lb (96.2 kg)     Height 02/13/16 0429 5\' 9"  (1.753 m)     Head Circumference --      Peak Flow --      Pain Score 02/13/16 0429 9     Pain Loc --      Pain Edu? --      Excl. in [place]? --     Constitutional: Alert and oriented. No acute distress.  Head: Atraumatic.  Mouth/Throat: Mucous membranes are moist.   Cardiovascular: Normal rate, regular rhythm.  Respiratory: Normal respiratory effort.  No retractions.  Musculoskeletal: Back: No vertebral tenderness to palpation, mild tenderness lumbar paraspinal area. b/l 1+ edema. 2+DP pulses, feet are warm and well perfused.  Neurologic:  Normal speec h and language. No lower extremity weakness, sensation intact.  Skin:  Skin is warm, dry and intact. No rash noted.   ____________________________________________   LABS (all labs ordered are listed, but only abnormal results are displayed)  Labs Reviewed - No data to  display ____________________________________________  EKG   ____________________________________________  RADIOLOGY  none ____________________________________________   PROCEDURES  Procedure(s) performed: No    Critical Care performed: No ____________________________________________   INITIAL IMPRESSION / ASSESSMENT AND PLAN / ED COURSE  Pertinent labs & imaging results that were available during my care of the patient were reviewed by me and considered in my medical decision making (see chart for details).  Patient presents with back pain/left hip pain radiating down her left leg. Her extremities are warm and well perfused. She had a normal ultrasound 1 week ago negative for DVT per patient. Does not appear to be vascular in nature especially given her description of radiating electric like pain.. Strongly suspect nerve inflammation secondary to disc compression  and I will refer her to our neurosurgeons for further evaluation. She will likely need outpatient MRI for further evaluation. We will treat with Decadron and analgesics. No weakness. Patient is able to ambulate on her own. No fevers/chills. No urinary symptoms. Referred to Dr. Aris Lot of neurosurgery     ____________________________________________   FINAL CLINICAL IMPRESSION(S) / ED DIAGNOSES  Final diagnoses:  Lumbar nerve root compression      NEW MEDICATIONS STARTED DURING THIS VISIT:  New Prescriptions   No medications on file     Note:  This document was prepared using Dragon voice recognition software and may include unintentional dictation errors.    Lavonia Drafts, MD 02/13/16 [phone]

## 2016-02-16 ENCOUNTER — Emergency Department: Payer: Medicare Other

## 2016-02-16 ENCOUNTER — Encounter: Payer: Self-pay | Admitting: Emergency Medicine

## 2016-02-16 ENCOUNTER — Emergency Department
Admission: EM | Admit: 2016-02-16 | Discharge: 2016-02-16 | Disposition: A | Discharge status: Home / Self Care | Payer: Medicare Other | Attending: Emergency Medicine | Admitting: Emergency Medicine

## 2016-02-16 DIAGNOSIS — M545 Low back pain, unspecified: Secondary | ICD-10-CM

## 2016-02-16 DIAGNOSIS — I10 Essential (primary) hypertension: Secondary | ICD-10-CM | POA: Insufficient documentation

## 2016-02-16 DIAGNOSIS — Z85528 Personal history of other malignant neoplasm of kidney: Secondary | ICD-10-CM | POA: Insufficient documentation

## 2016-02-16 DIAGNOSIS — Z79899 Other long term (current) drug therapy: Secondary | ICD-10-CM | POA: Diagnosis not present

## 2016-02-16 DIAGNOSIS — J45909 Unspecified asthma, uncomplicated: Secondary | ICD-10-CM | POA: Insufficient documentation

## 2016-02-16 DIAGNOSIS — Z7982 Long term (current) use of aspirin: Secondary | ICD-10-CM | POA: Insufficient documentation

## 2016-02-16 LAB — CK: Total CK: 89 U/L (ref 38–234)

## 2016-02-16 LAB — COMPREHENSIVE METABOLIC PANEL
ALBUMIN: 3.9 g/dL (ref 3.5–5.0)
ALT: 25 U/L (ref 14–54)
AST: 28 U/L (ref 15–41)
Alkaline Phosphatase: 44 U/L (ref 38–126)
Anion gap: 6 (ref 5–15)
BUN: 15 mg/dL (ref 6–20)
CHLORIDE: 108 mmol/L (ref 101–111)
CO2: 25 mmol/L (ref 22–32)
CREATININE: 0.77 mg/dL (ref 0.44–1.00)
Calcium: 9.4 mg/dL (ref 8.9–10.3)
GFR calc non Af Amer: >60 mL/min (ref >60)
Glucose, Bld: 102 mg/dL — ABNORMAL HIGH (ref 65–99)
Potassium: 3.5 mmol/L (ref 3.5–5.1)
SODIUM: 139 mmol/L (ref 135–145)
Total Bilirubin: 0.6 mg/dL (ref 0.3–1.2)
Total Protein: 8.4 g/dL — ABNORMAL HIGH (ref 6.5–8.1)

## 2016-02-16 LAB — CBC WITH DIFFERENTIAL/PLATELET
Basophils Absolute: 0 10*3/uL (ref 0–0.1)
Basophils Relative: 0 %
EOS ABS: 0.5 10*3/uL (ref 0–0.7)
Eosinophils Relative: 6 %
HCT: 37.9 % (ref 35.0–47.0)
HEMOGLOBIN: 12.4 g/dL (ref 12.0–16.0)
LYMPHS ABS: 2.5 10*3/uL (ref 1.0–3.6)
Lymphocytes Relative: 33 %
MCH: 27.8 pg (ref 26.0–34.0)
MCHC: 32.7 g/dL (ref 32.0–36.0)
MCV: 85 fL (ref 80.0–100.0)
MONOS PCT: 5 %
Monocytes Absolute: 0.4 10*3/uL (ref 0.2–0.9)
NEUTROS PCT: 56 %
Neutro Abs: 4.2 10*3/uL (ref 1.4–6.5)
Platelets: 344 10*3/uL (ref 150–440)
RBC: 4.46 MIL/uL (ref 3.80–5.20)
RDW: 13.5 % (ref 11.5–14.5)
WBC: 7.6 10*3/uL (ref 3.6–11.0)

## 2016-02-16 NOTE — Discharge Instructions (Signed)

## 2016-02-16 NOTE — ED Triage Notes (Addendum)
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

## 2016-02-16 NOTE — ED Provider Notes (Signed)
[facility] Emergency Department Provider Note   ____________________________________________   First MD Initiated Contact with Patient 02/16/16 2259     (approximate)  I have reviewed the triage vital signs and the nursing notes.   HISTORY  Chief Complaint Leg Pain    HPI KALEEMA VITULLO is a 66 y.o. female here for evaluation of left lower back pain  Patient reports over a week of pain in the left lower back radiates down across the side of the left upper leg towards her calf. Reports is making difficult for her to walk. She was seen and given steroids in the emergency room a couple days ago with no change. She's tried medications at home with no relief. She has not had any changes in her bowel or bladder habits. No incontinence. No loss of sensation. No weakness in the legs.  She has swelling and darkening of the skin of the legs bilaterally, reports that this is chronic.  Past Medical History:  Diagnosis Date  . Asthma   . Cancer ([place])    kidney  . Hypertension     Patient Active Problem List   Diagnosis Date Noted  . Bilateral cellulitis of lower leg 10/03/2015  . Generalized weakness 09/09/2014  . 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 ABDOMINAL HYSTERECTOMY      Prior to Admission medications   Medication Sig Start Date End Date Taking? Authorizing Provider  albuterol (PROVENTIL HFA;VENTOLIN HFA) 108 (90 BASE) MCG/ACT inhaler Inhale 2 puffs into the lungs every 6 (six) hours as needed for wheezing or shortness of breath. 12/24/14   [name], MD  aspirin EC 81 MG tablet Take 81 mg by mouth daily.    Historical Provider, MD  cyclobenzaprine (FLEXERIL) 10 MG tablet Take 1 tablet (10 mg total) by mouth at bedtime. 01/30/16   Norval Gable, MD  lisinopril (PRINIVIL,ZESTRIL) 40 MG tablet Take 1 tablet by mouth daily. 08/27/14   Historical Provider, MD  oxyCODONE-acetaminophen (ROXICET)  5-325 MG tablet Take 1 tablet by mouth every 6 (six) hours as needed. 02/12/16   Loney Hering, MD  potassium chloride SA (K-DUR,KLOR-CON) 20 MEQ tablet Take 20 mEq by mouth 2 (two) times daily.    Historical Provider, MD  sulfamethoxazole-trimethoprim (BACTRIM DS,SEPTRA DS) 800-160 MG tablet Take 1 tablet by mouth 2 (two) times daily. Patient not taking: Reported on 02/12/2016 10/28/15   Norval Gable, MD    Allergies Shellfish allergy and Penicillins  Family History  Problem Relation Age of Onset  . Stroke Neg Hx     Social History Social History  Substance Use Topics  . Smoking status: Never Smoker  . Smokeless tobacco: Never Used  . Alcohol use No    Review of Systems Constitutional: No fever/chills Eyes: No visual changes. ENT: No sore throat. Cardiovascular: Denies chest pain. Respiratory: Denies shortness of breath. Gastrointestinal: No abdominal pain.  No nausea, no vomiting.  No diarrhea.  No constipation. Genitourinary: Negative for dysuria. Musculoskeletal:See history of present illnessn: Negative for rash. Neurological: Negative for headaches, focal weakness or numbness.  10-point ROS otherwise negative.  ____________________________________________   PHYSICAL EXAM:  VITAL SIGNS: ED Triage Vitals [02/16/16 1931]  Enc Vitals Group     BP (!) 151/75     Pulse Rate 93     Resp 18     Temp 98 F (36.7 C)     Temp Source Oral     SpO2 99 %  Weight 212 lb (96.2 kg)     Height 5\' 9"  (1.753 m)     Head Circumference      Peak Flow      Pain Score 10     Pain Loc      Pain Edu?      Excl. in [place]?     Constitutional: Alert and oriented. Well appearing and in no acute distress. Eyes: Conjunctivae are normal. PERRL. EOMI. Head: Atraumatic. Nose: No congestion/rhinnorhea. Mouth/Throat: Mucous membranes are moist.  Oropharynx non-erythematous. Neck: No stridor.   Cardiovascular: Normal rate, regular rhythm. Grossly normal heart sounds.  Good  peripheral circulation. Respiratory: Normal respiratory effort.  No retractions. Lungs CTAB. Gastrointestinal: Soft and nontender. No distention. Mild to moderate tenderness over the left lower paraspinous muscles and along the mid lumbar spine  Musculoskeletal:   Lower Extremities  No edema. Normal DP/PT pulses bilateral with good cap refill.  Normal neuro-motor function lower extremities bilateral.  RIGHT Right lower extremity demonstrates normal strength, good use of all muscles. No edema bruising or contusions of the right hip, right knee, right ankle. Full range of motion of the right lower extremity without pain. No pain on axial loading. No evidence of trauma.  LEFT Left lower extremity demonstrates normal strength, good use of all muscles. No edema bruising or contusions of the hip,  knee, ankle. Full range of motion of the left lower extremity without pain except about the region of the mid left calf, which is also somewhat darkened and skin color and swollen. Patient reports this area has been swollen and tender since the time that she was admitted to the hospital for infection, and the darkening of her skin never went back to normal.. No pain on axial loading. No evidence of trauma.   Neurologic:  Normal speech and language. No gross focal neurologic deficits are appreciated. No gait instability. Skin:  Skin is warm, dry and intact. No rash noted. Psychiatric: Mood and affect are normal. Speech and behavior are normal.  ____________________________________________   LABS (all labs ordered are listed, but only abnormal results are displayed)  Labs Reviewed  COMPREHENSIVE METABOLIC PANEL - Abnormal; Notable for the following:       Result Value   Glucose, Bld 102 (*)    Total Protein 8.4 (*)    All other components within normal limits  CK  CBC WITH DIFFERENTIAL/PLATELET    ____________________________________________  EKG   ____________________________________________  RADIOLOGY  US Venous Img Lower Unilateral Left  Result Date: 02/16/2016 CLINICAL DATA:  Left lower extremity swelling and pain. EXAM: LEFT LOWER EXTREMITY VENOUS DOPPLER ULTRASOUND TECHNIQUE: Gray-scale sonography with graded compression, as well as color Doppler and duplex ultrasound were performed to evaluate the lower extremity deep venous systems from the level of the common femoral vein and including the common femoral, femoral, profunda femoral, popliteal and calf veins including the posterior tibial, peroneal and gastrocnemius veins when visible. The superficial great saphenous vein was also interrogated. Spectral Doppler was utilized to evaluate flow at rest and with distal augmentation maneuvers in the common femoral, femoral and popliteal veins. COMPARISON:  None. FINDINGS: Contralateral Common Femoral Vein: Respiratory phasicity is normal and symmetric with the symptomatic side. No evidence of thrombus. Normal compressibility. Common Femoral Vein: No evidence of thrombus. Normal compressibility, respiratory phasicity and response to augmentation. Saphenofemoral Junction: No evidence of thrombus. Normal compressibility and flow on color Doppler imaging. Profunda Femoral Vein: No evidence of thrombus. Normal compressibility and flow on color Doppler imaging.  Femoral Vein: No evidence of thrombus. Normal compressibility, respiratory phasicity and response to augmentation. Popliteal Vein: No evidence of thrombus. Normal compressibility, respiratory phasicity and response to augmentation. Calf Veins: No evidence of thrombus. Normal compressibility and flow on color Doppler imaging. Superficial Great Saphenous Vein: No evidence of thrombus. Normal compressibility and flow on color Doppler imaging. Venous Reflux:  None. Other Findings:  None. IMPRESSION: No evidence of deep venous thrombosis, left  lower extremity. Ill-defined edema within the superficial soft tissues of the left calf, corresponding to the area of patient's pain. Patent varicose veins are identified within the medial left calf. Electronically Signed   By: Franki Cabot M.D.   On: 02/16/2016 20:55   Also noted recent CT abdomen and pelvis ____________________________________________   PROCEDURES  Procedure(s) performed: None  Procedures  Critical Care performed: No  ____________________________________________   INITIAL IMPRESSION / ASSESSMENT AND PLAN / ED COURSE  Pertinent labs & imaging results that were available during my care of the patient were reviewed by me and considered in my medical decision making (see chart for details).  Patient presents with back pain and left hip pain radiating down the left leg. Extremities warm and well perfused. There is no evidence of DVT. I suspect primarily etiology of low back pain secondary to potentially nerve impingement or spinal nerve compression. She does not demonstrate any signs of cauda equina. He reports having as appointment on Tuesday with Dr. Aris Lot of neurosurgery. She has no fevers or chills. No urinary symptoms. No obvious red flags for prompt further imaging or workup.  Discussed with the patient and her husband, we'll discharge the patient and they will plan to follow up with Dr. Aris Lot Tuesday with neurosurgery.      ____________________________________________   FINAL CLINICAL IMPRESSION(S) / ED DIAGNOSES  Final diagnoses:  Acute left-sided low back pain without sciatica      NEW MEDICATIONS STARTED DURING THIS VISIT:  New Prescriptions   No medications on file     Note:  This document was prepared using Dragon voice recognition software and may include unintentional dictation errors.     Delman Kitten, MD 02/16/16 [phone]

## 2016-02-16 NOTE — ED Notes (Signed)
Esignature pad not working, Haematologist. Pt received DC instructions, verbalized understanding of when she would seek immediate medical care.

## 2016-02-21 ENCOUNTER — Other Ambulatory Visit: Payer: Self-pay | Admitting: Neurological Surgery

## 2016-02-21 DIAGNOSIS — M79605 Pain in left leg: Secondary | ICD-10-CM

## 2016-02-24 ENCOUNTER — Ambulatory Visit
Admission: RE | Admit: 2016-02-24 | Discharge: 2016-02-24 | Disposition: A | Discharge status: Home / Self Care | Payer: Medicare Other | Source: Ambulatory Visit | Attending: Neurological Surgery | Admitting: Neurological Surgery

## 2016-02-24 DIAGNOSIS — M79605 Pain in left leg: Secondary | ICD-10-CM | POA: Diagnosis present

## 2016-02-24 DIAGNOSIS — M5126 Other intervertebral disc displacement, lumbar region: Secondary | ICD-10-CM | POA: Diagnosis not present

## 2016-02-24 DIAGNOSIS — M5127 Other intervertebral disc displacement, lumbosacral region: Secondary | ICD-10-CM | POA: Insufficient documentation

## 2016-03-30 ENCOUNTER — Encounter: Payer: Self-pay | Admitting: Emergency Medicine

## 2016-03-30 ENCOUNTER — Emergency Department: Payer: Medicare Other

## 2016-03-30 ENCOUNTER — Emergency Department
Admission: EM | Admit: 2016-03-30 | Discharge: 2016-03-30 | Disposition: A | Discharge status: Home / Self Care | Payer: Medicare Other | Attending: Emergency Medicine | Admitting: Emergency Medicine

## 2016-03-30 DIAGNOSIS — I10 Essential (primary) hypertension: Secondary | ICD-10-CM | POA: Insufficient documentation

## 2016-03-30 DIAGNOSIS — R0789 Other chest pain: Secondary | ICD-10-CM | POA: Diagnosis present

## 2016-03-30 DIAGNOSIS — R600 Localized edema: Secondary | ICD-10-CM | POA: Insufficient documentation

## 2016-03-30 DIAGNOSIS — Z79899 Other long term (current) drug therapy: Secondary | ICD-10-CM | POA: Insufficient documentation

## 2016-03-30 DIAGNOSIS — R079 Chest pain, unspecified: Secondary | ICD-10-CM

## 2016-03-30 DIAGNOSIS — Z7982 Long term (current) use of aspirin: Secondary | ICD-10-CM | POA: Insufficient documentation

## 2016-03-30 DIAGNOSIS — Z85528 Personal history of other malignant neoplasm of kidney: Secondary | ICD-10-CM | POA: Insufficient documentation

## 2016-03-30 DIAGNOSIS — R0602 Shortness of breath: Secondary | ICD-10-CM | POA: Insufficient documentation

## 2016-03-30 DIAGNOSIS — J45909 Unspecified asthma, uncomplicated: Secondary | ICD-10-CM | POA: Diagnosis not present

## 2016-03-30 LAB — CBC
HEMATOCRIT: 36.9 % (ref 35.0–47.0)
HEMOGLOBIN: 12.6 g/dL (ref 12.0–16.0)
MCH: 29 pg (ref 26.0–34.0)
MCHC: 34.1 g/dL (ref 32.0–36.0)
MCV: 85 fL (ref 80.0–100.0)
Platelets: 346 10*3/uL (ref 150–440)
RBC: 4.34 MIL/uL (ref 3.80–5.20)
RDW: 13.7 % (ref 11.5–14.5)
WBC: 5.9 10*3/uL (ref 3.6–11.0)

## 2016-03-30 LAB — BASIC METABOLIC PANEL WITH GFR
Anion gap: 5 (ref 5–15)
BUN: 17 mg/dL (ref 6–20)
CO2: 25 mmol/L (ref 22–32)
Calcium: 9.2 mg/dL (ref 8.9–10.3)
Chloride: 108 mmol/L (ref 101–111)
Creatinine, Ser: 0.57 mg/dL (ref 0.44–1.00)
GFR calc Af Amer: >60 mL/min
GFR calc non Af Amer: >60 mL/min
Glucose, Bld: 95 mg/dL (ref 65–99)
Potassium: 3.2 mmol/L — ABNORMAL LOW (ref 3.5–5.1)
Sodium: 138 mmol/L (ref 135–145)

## 2016-03-30 LAB — TROPONIN I
Troponin I: <0.03 ng/mL
Troponin I: <0.03 ng/mL

## 2016-03-30 NOTE — ED Provider Notes (Signed)
[facility] Emergency Department Provider Note  Time seen: 4:50 PM  I have reviewed the triage vital signs and the nursing notes.   HISTORY  Chief Complaint Chest Pain    HPI Janet [name] is a 66 y.o. female with a past medical history of asthma, hypertension, presents to the emergency department for chest discomfort. According to the patient she went to an orthopedist today for a spinal injection for a slipped disc which she receives fairly regularly. She states while there they noted her blood pressure to be elevated. They stated it was too elevated to perform the injection. When they discussed this with the patient she states she has been feeling some mild chest discomfort over the past 2 days which she describes as a fluttering sensation to the center of her chest. Denies any "pain." Denies any nausea or diaphoresis. She does state intermittent shortness of breath. Denies any leg pain. Patient does have mild leg swelling bilaterally which is chronic for the patient and she wears compression stockings, denies any acute worsening of this. Patient denies any symptoms at this time. Patient's blood pressure currently 164/75.  Past Medical History:  Diagnosis Date  . Asthma   . Cancer ([place])    kidney  . Hypertension     Patient Active Problem List   Diagnosis Date Noted  . Bilateral cellulitis of lower leg 10/03/2015  . Generalized weakness 09/09/2014  . 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 ABDOMINAL HYSTERECTOMY      Prior to Admission medications   Medication Sig Start Date End Date Taking? Authorizing Provider  albuterol (PROVENTIL HFA;VENTOLIN HFA) 108 (90 BASE) MCG/ACT inhaler Inhale 2 puffs into the lungs every 6 (six) hours as needed for wheezing or shortness of breath. 12/24/14   [name], MD  aspirin EC 81 MG tablet Take 81 mg by mouth daily.    Historical Provider, MD  cyclobenzaprine  (FLEXERIL) 10 MG tablet Take 1 tablet (10 mg total) by mouth at bedtime. 01/30/16   Norval Gable, MD  lisinopril (PRINIVIL,ZESTRIL) 40 MG tablet Take 1 tablet by mouth daily. 08/27/14   Historical Provider, MD  oxyCODONE-acetaminophen (ROXICET) 5-325 MG tablet Take 1 tablet by mouth every 6 (six) hours as needed. 02/12/16   Loney Hering, MD  potassium chloride SA (K-DUR,KLOR-CON) 20 MEQ tablet Take 20 mEq by mouth 2 (two) times daily.    Historical Provider, MD  sulfamethoxazole-trimethoprim (BACTRIM DS,SEPTRA DS) 800-160 MG tablet Take 1 tablet by mouth 2 (two) times daily. Patient not taking: Reported on 02/12/2016 10/28/15   Norval Gable, MD    Allergies  Allergen Reactions  . Shellfish Allergy Anaphylaxis  . Penicillins Hives and Other (See Comments)    Has patient had a PCN reaction causing immediate rash, facial/tongue/throat swelling, SOB or lightheadedness with hypotension: no Has patient had a PCN reaction causing severe rash involving mucus membranes or skin necrosis: no Has patient had a PCN reaction that required hospitalization? no Has patient had a PCN reaction occurring within the last 10 years: no If all of the above answers are "NO", then may proceed with Cephalosporin use.     Family History  Problem Relation Age of Onset  . Stroke Neg Hx     Social History Social History  Substance Use Topics  . Smoking status: Never Smoker  . Smokeless tobacco: Never Used  . Alcohol use No    Review of Systems Constitutional: Negative for  fever. Cardiovascular: Mild chest discomfort/fluttering over the past several days. Respiratory: Intermittent mild shortness breath. None currently. Gastrointestinal: Negative for abdominal pain. Negative for nausea. Genitourinary: Negative for dysuria. Neurological: Negative for headache 10-point ROS otherwise negative.  ____________________________________________   PHYSICAL EXAM:  VITAL SIGNS: ED Triage Vitals  Enc Vitals  Group     BP 03/30/16 1232 (!) 164/75     Pulse Rate 03/30/16 1232 64     Resp --      Temp 03/30/16 1232 98.9 F (37.2 C)     Temp Source 03/30/16 1232 Oral     SpO2 03/30/16 1232 100 %     Weight 03/30/16 1230 210 lb (95.3 kg)     Height 03/30/16 1230 5\' 9"  (1.753 m)     Head Circumference --      Peak Flow --      Pain Score 03/30/16 1230 6     Pain Loc --      Pain Edu? --      Excl. in [place]? --    Constitutional: Alert and oriented. Well appearing and in no distress. Eyes: Normal exam ENT   Head: Normocephalic and atraumatic.   Mouth/Throat: Mucous membranes are moist. Cardiovascular: Normal rate, regular rhythm. No murmur Respiratory: Normal respiratory effort without tachypnea nor retractions. Breath sounds are clear Gastrointestinal: Soft and nontender. No distention.   Musculoskeletal: Nontender with normal range of motion in all extremities. No lower extremity tenderness. Mild lower extremity edema bilaterally. Neurologic:  Normal speech and language. No gross focal neurologic deficits  Skin:  Skin is warm, dry and intact.  Psychiatric: Mood and affect are normal.   ____________________________________________    EKG  EKG reviewed and interpreted by myself shows normal sinus rhythm at 65 bpm, narrow QRS, normal axis, normal intervals, no ST changes.  ____________________________________________    RADIOLOGY  Chest x-ray is negative  ____________________________________________   INITIAL IMPRESSION / ASSESSMENT AND PLAN / ED COURSE  Pertinent labs & imaging results that were available during my care of the patient were reviewed by me and considered in my medical decision making (see chart for details).  Patient presents to the emergency department for elevated blood pressure and intermittent chest discomfort over the past 2 days. She describes the discomfort as more of a fluttering sensation denies any pain. Patient currently denies any symptoms.  Overall patient appears well with a normal physical examination. Patient's labs including troponin are normal. Patient has been in the emergency department greater than 4 hours at this time, we will repeat a troponin and continue to monitor on a cardiac monitor. If the patient's repeat troponin is normal we will discharge with cardiology follow-up. Patient states she had a stress test performed less than one year ago which was normal per her.  Patient's troponin and repeat troponin are negative. Patient remains very well-appearing, current blood pressure 131/73. Patient remains in normal sinus rhythm with no ectopic beats noted. Patient states she is feeling well, family is here with her. I suggested the patient follow-up with the primary care doctor on Monday for recheck such reevaluation and to discuss any further testing such as a stress test although the patient states she had one less than one year ago which was normal per patient.  ____________________________________________   FINAL CLINICAL IMPRESSION(S) / ED DIAGNOSES  Chest pain Hypertension    Harvest Dark, MD 03/30/16 [phone]

## 2016-03-30 NOTE — ED Triage Notes (Signed)
Patient presents to ED from [facility] with c/o chest discomfort since yesterday. Patient denies nausea. C/O slight SOB. Patient speaking full sentences without difficulty.

## 2016-03-30 NOTE — Discharge Instructions (Signed)
You have been seen in the emergency department today for chest pain and high blood pressure. Your workup has shown normal results. As we discussed please follow-up with your primary care physician in the next 1-2 days for recheck. Return to the emergency department for any further chest pain, trouble breathing, or any other symptom personally concerning to yourself. °

## 2016-04-23 DIAGNOSIS — J45909 Unspecified asthma, uncomplicated: Secondary | ICD-10-CM | POA: Insufficient documentation

## 2016-04-23 DIAGNOSIS — R002 Palpitations: Secondary | ICD-10-CM | POA: Insufficient documentation

## 2016-04-23 DIAGNOSIS — Z85528 Personal history of other malignant neoplasm of kidney: Secondary | ICD-10-CM | POA: Insufficient documentation

## 2016-05-15 ENCOUNTER — Ambulatory Visit
Admission: EM | Admit: 2016-05-15 | Discharge: 2016-05-15 | Disposition: A | Discharge status: Home / Self Care | Payer: Medicare Other | Attending: Family Medicine | Admitting: Family Medicine

## 2016-05-15 DIAGNOSIS — R51 Headache: Secondary | ICD-10-CM | POA: Diagnosis not present

## 2016-05-15 DIAGNOSIS — R519 Headache, unspecified: Secondary | ICD-10-CM

## 2016-05-15 MED ORDER — ONDANSETRON 8 MG PO TBDP
8.0000 mg | ORAL_TABLET | Freq: Two times a day (BID) | ORAL | 0 refills | Status: DC
Start: 2016-05-15 — End: 2016-12-11

## 2016-05-15 MED ORDER — NAPROXEN 500 MG PO TABS: 500.0000 mg | ORAL_TABLET | Freq: Two times a day (BID) | ORAL | 0 refills | Status: DC

## 2016-05-15 NOTE — ED Provider Notes (Signed)
CSN: [id number]     Arrival date & time 05/15/16  1928 History   First MD Initiated Contact with Patient 05/15/16 2021     Chief Complaint  Patient presents with  . Headache   (Consider location/radiation/quality/duration/timing/severity/associated sxs/prior Treatment) HPI This is a 66 year old female presents with a frontal headache that she has had for the last couple of weeks. Taking over-the-counter medications with that Tylenol and BC powders and will have the headache had decrease but this only happens for a short period of time and returns. States that if she laughs or strains that seems to make it more prevalent. Not had any numbness or tingling or weakness. She's had no facial weakness she not had any coordination problems. Does have some nausea but no vomiting. 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 Asthma   . Cancer ([place])    kidney  . Hypertension    Past Surgical History:  Procedure Laterality Date  . ABDOMINAL HYSTERECTOMY     Family History  Problem Relation Age of Onset  . Stroke Neg Hx    Social History  Substance Use Topics  . Smoking status: Never Smoker  . Smokeless tobacco: Never Used  . Alcohol use No   OB History    Gravida Para Term Preterm AB Living   3         2   SAB TAB Ectopic Multiple Live Births                 Review of Systems  Constitutional: Positive for activity change. Negative for appetite change, chills, fatigue and fever.  HENT: Positive for sinus pain.   Neurological: Positive for headaches. Negative for dizziness, tremors, seizures, syncope, facial asymmetry, speech difficulty, weakness and numbness.  All other systems reviewed and are negative.   Allergies  Shellfish allergy and Penicillins  Home Medications   Prior to Admission medications   Medication Sig Start Date End Date Taking? Authorizing Provider  albuterol (PROVENTIL HFA;VENTOLIN HFA) 108 (90 BASE) MCG/ACT inhaler  Inhale 2 puffs into the lungs every 6 (six) hours as needed for wheezing or shortness of breath. 12/24/14  Yes [name], MD  aspirin EC 81 MG tablet Take 81 mg by mouth daily.   Yes Historical Provider, MD  lisinopril (PRINIVIL,ZESTRIL) 40 MG tablet Take 1 tablet by mouth daily. 08/27/14  Yes Historical Provider, MD  potassium chloride SA (K-DUR,KLOR-CON) 20 MEQ tablet Take 20 mEq by mouth 2 (two) times daily.   Yes Historical Provider, MD  naproxen (NAPROSYN) 500 MG tablet Take 1 tablet (500 mg total) by mouth 2 (two) times daily with a meal. 05/15/16   Lorin Picket, PA-C  ondansetron (ZOFRAN ODT) 8 MG disintegrating tablet Take 1 tablet (8 mg total) by mouth 2 (two) times daily. 05/15/16   Lorin Picket, PA-C   Meds Ordered and Administered this Visit  Medications - No data to display  BP (!) 161/66 (BP Location: Left Arm)   Pulse 77   Temp 98.3 F (36.8 C) (Oral)   Resp 18   Ht 5\' 9"  (1.753 m)   Wt 209 lb (94.8 kg)   SpO2 100%   BMI 30.86 kg/m  No data found.   Physical Exam  Constitutional: She is oriented to person, place, and time. She appears well-developed and well-nourished. No distress.  HENT:  Head: Normocephalic and atraumatic.  Right Ear: External ear normal.  Left Ear:  External ear normal.  Eyes: EOM are normal. Pupils are equal, round, and reactive to light. Right eye exhibits no discharge. Left eye exhibits no discharge.  Neck: Normal range of motion. Neck supple.  Musculoskeletal: Normal range of motion.  Lymphadenopathy:    She has no cervical adenopathy.  Neurological: She is alert and oriented to person, place, and time. She displays normal reflexes. No cranial nerve deficit or sensory deficit. She exhibits normal muscle tone. Coordination normal.  Skin: Skin is warm and dry. She is not diaphoretic.  Psychiatric: She has a normal mood and affect. Her behavior is normal. Judgment and thought content normal.  Nursing note and vitals reviewed.   Urgent  Care Course     Procedures (including critical care time)  Labs Review Labs Reviewed - No data to display  Imaging Review No results found.   Visual Acuity Review  Right Eye Distance:   Left Eye Distance:   Bilateral Distance:    Right Eye Near:   Left Eye Near:    Bilateral Near:         MDM   1. Acute nonintractable headache, unspecified headache type    Discharge Medication List as of 05/15/2016  8:34 PM    START taking these medications   Details  naproxen (NAPROSYN) 500 MG tablet Take 1 tablet (500 mg total) by mouth 2 (two) times daily with a meal., Starting Tue 05/15/2016, Normal    ondansetron (ZOFRAN ODT) 8 MG disintegrating tablet Take 1 tablet (8 mg total) by mouth 2 (two) times daily., Starting Tue 05/15/2016, Normal      Plan: 1. Test/x-ray results and diagnosis reviewed with patient 2. rx as per orders; risks, benefits, potential side effects reviewed with patient 3. Recommend supportive treatment with Cool compresses to her head may be comfortable. She will stop the EC powders and does switch over to Naprosyn and I offered her a Toradol shot but she refused. I recommended that she has been having this pain for several weeks that has been unremitting she should follow-up with her primary care physician for next extensive workup and management. States she will call Dr. Kimberlee Nearing tomorrow to schedule an appointment. 4. F/u prn if symptoms worsen or don't improve     Lorin Picket, PA-C 05/15/16 2042

## 2016-05-15 NOTE — ED Triage Notes (Addendum)
Pt c/o a headache for the last couple weeks, she has been taking otc meds and it will knock the headache down but it comes right back. She says it is noise sensitive, when she laughs it hurts really bad.

## 2016-05-25 DIAGNOSIS — I493 Ventricular premature depolarization: Secondary | ICD-10-CM | POA: Insufficient documentation

## 2016-07-27 ENCOUNTER — Ambulatory Visit
Admission: RE | Admit: 2016-07-27 | Discharge: 2016-07-27 | Disposition: A | Discharge status: Home / Self Care | Payer: Medicare Other | Source: Ambulatory Visit | Attending: Family Medicine | Admitting: Family Medicine

## 2016-07-27 ENCOUNTER — Other Ambulatory Visit: Payer: Self-pay | Admitting: Family Medicine

## 2016-07-27 DIAGNOSIS — M25511 Pain in right shoulder: Secondary | ICD-10-CM | POA: Diagnosis present

## 2016-09-13 ENCOUNTER — Other Ambulatory Visit: Payer: Self-pay | Admitting: Family Medicine

## 2016-09-13 DIAGNOSIS — Z1231 Encounter for screening mammogram for malignant neoplasm of breast: Secondary | ICD-10-CM

## 2016-09-19 ENCOUNTER — Encounter: Payer: Self-pay | Admitting: Medical Oncology

## 2016-09-19 ENCOUNTER — Emergency Department
Admission: EM | Admit: 2016-09-19 | Discharge: 2016-09-19 | Disposition: A | Discharge status: Home / Self Care | Payer: Medicare Other | Attending: Emergency Medicine | Admitting: Emergency Medicine

## 2016-09-19 DIAGNOSIS — Z791 Long term (current) use of non-steroidal anti-inflammatories (NSAID): Secondary | ICD-10-CM | POA: Insufficient documentation

## 2016-09-19 DIAGNOSIS — Y9289 Other specified places as the place of occurrence of the external cause: Secondary | ICD-10-CM | POA: Diagnosis not present

## 2016-09-19 DIAGNOSIS — Y939 Activity, unspecified: Secondary | ICD-10-CM | POA: Diagnosis not present

## 2016-09-19 DIAGNOSIS — W268XXA Contact with other sharp object(s), not elsewhere classified, initial encounter: Secondary | ICD-10-CM | POA: Insufficient documentation

## 2016-09-19 DIAGNOSIS — Z7982 Long term (current) use of aspirin: Secondary | ICD-10-CM | POA: Insufficient documentation

## 2016-09-19 DIAGNOSIS — Z79899 Other long term (current) drug therapy: Secondary | ICD-10-CM | POA: Diagnosis not present

## 2016-09-19 DIAGNOSIS — S51811A Laceration without foreign body of right forearm, initial encounter: Secondary | ICD-10-CM | POA: Diagnosis not present

## 2016-09-19 DIAGNOSIS — J45909 Unspecified asthma, uncomplicated: Secondary | ICD-10-CM | POA: Insufficient documentation

## 2016-09-19 DIAGNOSIS — S59911A Unspecified injury of right forearm, initial encounter: Secondary | ICD-10-CM | POA: Diagnosis present

## 2016-09-19 DIAGNOSIS — I10 Essential (primary) hypertension: Secondary | ICD-10-CM | POA: Diagnosis not present

## 2016-09-19 DIAGNOSIS — Y990 Civilian activity done for income or pay: Secondary | ICD-10-CM | POA: Diagnosis not present

## 2016-09-19 NOTE — ED Provider Notes (Signed)
[facility] Emergency Department Provider Note ____________________________________________  Time seen: 1  I have reviewed the triage vital signs and the nursing notes.  HISTORY  Chief Complaint  Laceration  HPI Janet [name] is a 66 y.o. female Presents to the ED with superficial laceration to her right forearm from work. Patient describes cutting her arm on the edge of her table at work. She denies any other injury at this time. Bleeding is currently controlled.   Past Medical History:  Diagnosis Date  . Asthma   . Cancer ([place])    kidney  . Hypertension     Patient Active Problem List   Diagnosis Date Noted  . Bilateral cellulitis of lower leg 10/03/2015  . Generalized weakness 09/09/2014  . 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 ABDOMINAL HYSTERECTOMY      Prior to Admission medications   Medication Sig Start Date End Date Taking? Authorizing Provider  albuterol (PROVENTIL HFA;VENTOLIN HFA) 108 (90 BASE) MCG/ACT inhaler Inhale 2 puffs into the lungs every 6 (six) hours as needed for wheezing or shortness of breath. 12/24/14   [name], MD  aspirin EC 81 MG tablet Take 81 mg by mouth daily.    [provider]  lisinopril (PRINIVIL,ZESTRIL) 40 MG tablet Take 1 tablet by mouth daily. 08/27/14   [provider]  naproxen (NAPROSYN) 500 MG tablet Take 1 tablet (500 mg total) by mouth 2 (two) times daily with a meal. 05/15/16   Lorin Picket, PA-C  ondansetron (ZOFRAN ODT) 8 MG disintegrating tablet Take 1 tablet (8 mg total) by mouth 2 (two) times daily. 05/15/16   Lorin Picket, PA-C  potassium chloride SA (K-DUR,KLOR-CON) 20 MEQ tablet Take 20 mEq by mouth 2 (two) times daily.    [provider]    Allergies Shellfish allergy and Penicillins  Family History  Problem Relation Age of Onset  . Stroke Neg Hx     Social History Social History  Substance Use  Topics  . Smoking status: Never Smoker  . Smokeless tobacco: Never Used  . Alcohol use No    Review of Systems  Constitutional: Negative for fever. Musculoskeletal: Negative for back pain. Skin: Negative for rash. Right foream lac as above  Neurological: Negative for headaches, focal weakness or numbness. ____________________________________________  PHYSICAL EXAM:  VITAL SIGNS: ED Triage Vitals  Enc Vitals Group     BP 09/19/16 1724 (!) 143/62     Pulse Rate 09/19/16 1724 99     Resp 09/19/16 1724 16     Temp 09/19/16 1724 98.7 F (37.1 C)     Temp Source 09/19/16 1724 Oral     SpO2 09/19/16 1724 98 %     Weight 09/19/16 1723 209 lb (94.8 kg)     Height --      Head Circumference --      Peak Flow --      Pain Score --      Pain Loc --      Pain Edu? --      Excl. in [place]? --     Constitutional: Alert and oriented. Well appearing and in no distress. Head: Normocephalic and atraumatic. Cardiovascular: Normal distal pulses. Respiratory: Normal respiratory effort. Musculoskeletal: Nontender with normal range of motion in all extremities.  Neurologic:  Normal gait without ataxia. Normal speech and language. No gross focal neurologic deficits are appreciated. Skin:  Skin is warm, dry and intact. No  rash noted. Right, proximal, volar forearm with a superficial linear laceration in a vertical lie. No active bleeding. ____________________________________________  PROCEDURES  LACERATION REPAIR Performed by: Melvenia Needles Authorized by: Melvenia Needles Consent: Verbal consent obtained. Risks and benefits: risks, benefits and alternatives were discussed Consent given by: patient Patient identity confirmed: provided demographic data Prepped and Draped in normal sterile fashion Wound explored  Laceration Location: right forearm   Laceration Length: 3 cm  No Foreign Bodies seen or palpated  Anesthesia: none  Cleaning method: soap & water Amount of  cleaning: appropriate pre-procedure cleansing.  Skin closure: Dermabond wound adhesive  Patient tolerance: Patient tolerated the procedure well with no immediate complications. ____________________________________________  INITIAL IMPRESSION / ASSESSMENT AND PLAN / ED COURSE  Patient ED evaluation of superficial right forearm laceration. The wound is cleansed and edges are approximately using Dermabond wound adhesive. Patient discharged with wound care instructions. She will follow with her primary care provider as needed. ____________________________________________  FINAL CLINICAL IMPRESSION(S) / ED DIAGNOSES  Final diagnoses:  Laceration of right forearm, initial encounter      Melvenia Needles, PA-C 09/19/16 1852    Hinda Kehr, MD 09/19/16 1940

## 2016-09-19 NOTE — Discharge Instructions (Signed)
Your superficial laceration was repaired with wound glue. Keep the area clean and dry. Follow-up with Dr. Lennox Grumbles as needed.

## 2016-09-19 NOTE — ED Triage Notes (Signed)
Pt has lac to rt FA that occurred today. Bleeding controlled.

## 2016-10-12 ENCOUNTER — Ambulatory Visit
Admission: RE | Admit: 2016-10-12 | Discharge: 2016-10-12 | Disposition: A | Discharge status: Home / Self Care | Payer: Medicare Other | Source: Ambulatory Visit | Attending: Family Medicine | Admitting: Family Medicine

## 2016-10-12 DIAGNOSIS — Z1231 Encounter for screening mammogram for malignant neoplasm of breast: Secondary | ICD-10-CM | POA: Diagnosis not present

## 2016-11-05 ENCOUNTER — Encounter: Payer: Self-pay | Admitting: Emergency Medicine

## 2016-11-05 ENCOUNTER — Ambulatory Visit
Admission: EM | Admit: 2016-11-05 | Discharge: 2016-11-05 | Disposition: A | Discharge status: Home / Self Care | Payer: Medicare Other | Attending: Family Medicine | Admitting: Family Medicine

## 2016-11-05 DIAGNOSIS — Z85528 Personal history of other malignant neoplasm of kidney: Secondary | ICD-10-CM | POA: Insufficient documentation

## 2016-11-05 DIAGNOSIS — Z79899 Other long term (current) drug therapy: Secondary | ICD-10-CM | POA: Insufficient documentation

## 2016-11-05 DIAGNOSIS — Z91013 Allergy to seafood: Secondary | ICD-10-CM | POA: Insufficient documentation

## 2016-11-05 DIAGNOSIS — L298 Other pruritus: Secondary | ICD-10-CM | POA: Diagnosis not present

## 2016-11-05 DIAGNOSIS — J45909 Unspecified asthma, uncomplicated: Secondary | ICD-10-CM | POA: Insufficient documentation

## 2016-11-05 DIAGNOSIS — N898 Other specified noninflammatory disorders of vagina: Secondary | ICD-10-CM | POA: Diagnosis not present

## 2016-11-05 DIAGNOSIS — I10 Essential (primary) hypertension: Secondary | ICD-10-CM | POA: Insufficient documentation

## 2016-11-05 DIAGNOSIS — Z7982 Long term (current) use of aspirin: Secondary | ICD-10-CM | POA: Insufficient documentation

## 2016-11-05 DIAGNOSIS — Z90710 Acquired absence of both cervix and uterus: Secondary | ICD-10-CM | POA: Insufficient documentation

## 2016-11-05 DIAGNOSIS — Z880 Allergy status to penicillin: Secondary | ICD-10-CM | POA: Diagnosis not present

## 2016-11-05 LAB — URINALYSIS, COMPLETE (UACMP) WITH MICROSCOPIC
BILIRUBIN URINE: NEGATIVE
Bacteria, UA: NONE SEEN
Glucose, UA: NEGATIVE mg/dL
KETONES UR: NEGATIVE mg/dL
LEUKOCYTES UA: NEGATIVE
NITRITE: NEGATIVE
Protein, ur: NEGATIVE mg/dL
Specific Gravity, Urine: 1.02 (ref 1.005–1.030)
WBC UA: NONE SEEN WBC/hpf (ref 0–5)
pH: 6 (ref 5.0–8.0)

## 2016-11-05 MED ORDER — FLUCONAZOLE 150 MG PO TABS: 150.0000 mg | ORAL_TABLET | Freq: Every day | ORAL | 0 refills | Status: DC

## 2016-11-05 NOTE — Discharge Instructions (Signed)
Take medication as prescribed. Drink plenty of fluids.  ° °Follow up with your primary care physician this week as needed. Return to Urgent care for new or worsening concerns.  ° °

## 2016-11-05 NOTE — ED Provider Notes (Signed)
[facility] ____________________________________________  Time seen: Approximately 7:22 PM  I have reviewed the triage vital signs and the nursing notes.   HISTORY  Chief Complaint Vaginal Itching   HPI Janet [name] is a 66 y.o. female present for evaluation of 2 weeks of vaginal itching and vaginal irritation. States upper vaginal area is itchy. Denies any vaginal discharge, vaginal odor, urinary frequency, urinary urgency or burning with urination. Denies known trigger. States not sexually active, declines any concerns of STDs. Patient reports she has had a vaginal yeast infection years ago with similar presentation. Denies any current pain. States only complaint is vaginal itching. Has not used any over-the-counter medications for the same complaints. Denies aggravating or alleviating factors. Reports otherwise feels well.Denies chest pain, shortness of breath, abdominal pain, dysuria, or rash. Denies recent sickness. Denies recent antibiotic use. Still has a small cancer on left kidney, denies renal insufficiency. No treatment, states they are only monitoring kidney.  [facility] [place]: PCP   Past Medical History:  Diagnosis Date  . Asthma   . Cancer ([place])    kidney  . Hypertension     Patient Active Problem List   Diagnosis Date Noted  . Bilateral cellulitis of lower leg 10/03/2015  . Generalized weakness 09/09/2014  . 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 ABDOMINAL HYSTERECTOMY       No current facility-administered medications for this encounter.   Current Outpatient Prescriptions:  .  aspirin EC 81 MG tablet, Take 81 mg by mouth daily., Disp: , Rfl:  .  lisinopril (PRINIVIL,ZESTRIL) 40 MG tablet, Take 1 tablet by mouth daily., Disp: , Rfl: 0 .  naproxen (NAPROSYN) 500 MG tablet, Take 1 tablet (500 mg total) by mouth 2 (two) times daily with a meal., Disp: 60 tablet, Rfl: 0 .  potassium  chloride SA (K-DUR,KLOR-CON) 20 MEQ tablet, Take 20 mEq by mouth 2 (two) times daily., Disp: , Rfl:  .  albuterol (PROVENTIL HFA;VENTOLIN HFA) 108 (90 BASE) MCG/ACT inhaler, Inhale 2 puffs into the lungs every 6 (six) hours as needed for wheezing or shortness of breath., Disp: 1 Inhaler, Rfl: 0 .  fluconazole (DIFLUCAN) 150 MG tablet, Take 1 tablet (150 mg total) by mouth daily. Take one pill orally, then Repeat in 72 hrs as needed., Disp: 2 tablet, Rfl: 0 .  ondansetron (ZOFRAN ODT) 8 MG disintegrating tablet, Take 1 tablet (8 mg total) by mouth 2 (two) times daily., Disp: 6 tablet, Rfl: 0  Allergies Shellfish allergy and Penicillins  Family History  Problem Relation Age of Onset  . Stroke Neg Hx     Social History Social History  Substance Use Topics  . Smoking status: Never Smoker  . Smokeless tobacco: Never Used  . Alcohol use No    Review of Systems Constitutional: No fever/chills Cardiovascular: Denies chest pain. Respiratory: Denies shortness of breath. Gastrointestinal: No abdominal pain.  No nausea, no vomiting.  No diarrhea.  No constipation. Genitourinary: Negative for dysuria. As above.  Musculoskeletal: Negative for back pain. Skin: Negative for rash.  ____________________________________________   PHYSICAL EXAM:  VITAL SIGNS: ED Triage Vitals  Enc Vitals Group     BP 11/05/16 1606 (!) 155/82     Pulse Rate 11/05/16 1606 78     Resp 11/05/16 1606 16     Temp 11/05/16 1606 98.5 F (36.9 C)     Temp Source 11/05/16 1606 Oral     SpO2 11/05/16 1606  100 %     Weight 11/05/16 1605 213 lb (96.6 kg)     Height 11/05/16 1605 5\' 6"  (1.676 m)     Head Circumference --      Peak Flow --      Pain Score 11/05/16 1605 0     Pain Loc --      Pain Edu? --      Excl. in [place]? --     Constitutional: Alert and oriented. Well appearing and in no acute distress. Cardiovascular: Normal rate, regular rhythm. Grossly normal heart sounds.  Good peripheral  circulation. Respiratory: Normal respiratory effort without tachypnea nor retractions. Breath sounds are clear and equal bilaterally. No wheezes, rales, rhonchi. Gastrointestinal: Soft and nontender.  No CVA tenderness. Pelvic: declined. Musculoskeletal:  No midline cervical, thoracic or lumbar tenderness to palpation.  Neurologic:  Normal speech and language.Speech is normal. No gait instability.  Skin:  Skin is warm, dry and intact. No rash noted. Psychiatric: Mood and affect are normal. Speech and behavior are normal. Patient exhibits appropriate insight and judgment   ___________________________________________   LABS (all labs ordered are listed, but only abnormal results are displayed)  Labs Reviewed  URINALYSIS, COMPLETE (UACMP) WITH MICROSCOPIC - Abnormal; Notable for the following:       Result Value   Hgb urine dipstick TRACE (*)    Squamous Epithelial / LPF 0-5 (*)    All other components within normal limits     PROCEDURES Procedures     INITIAL IMPRESSION / ASSESSMENT AND PLAN / ED COURSE  Pertinent labs & imaging results that were available during my care of the patient were reviewed by me and considered in my medical decision making (see chart for details).  Very well-appearing patient. No acute distress. Complaint of vaginal itching 2 weeks. discussed with patient pelvic exam, and even self wet prep swab, patient declined. Will treat patient with oral Diflucan, and patient states that does not work then she will return or follow-up with her primary for further evaluation and have pelvic and swabs completed that time. As patient reports very similar presentation with previous vaginal yeast infection, will treat with oral Diflucan. Discussed follow up with Primary care physician this week. Discussed indication, risks and benefits of medications with patient.Discussed follow up and return parameters including no resolution or any worsening concerns. Patient verbalized  understanding and agreed to plan.   ____________________________________________   FINAL CLINICAL IMPRESSION(S) / ED DIAGNOSES  Final diagnoses:  Vaginal itching     Discharge Medication List as of 11/05/2016  4:41 PM    START taking these medications   Details  fluconazole (DIFLUCAN) 150 MG tablet Take 1 tablet (150 mg total) by mouth daily. Take one pill orally, then Repeat in 72 hrs as needed., Starting Mon 11/05/2016, Normal        Note: This dictation was prepared with Dragon dictation along with smaller phrase technology. Any transcriptional errors that result from this process are unintentional.         Marylene Land, NP 11/05/16 1927

## 2016-11-05 NOTE — ED Triage Notes (Signed)
Patient in today c/o vaginal itching x 2 weeks. Denies discharge. Patient does state after she urinates she feels a slight burn. Patient denies urinary frequency. Patient states she has kidney cancer that is being watched by oncologist.

## 2016-11-16 ENCOUNTER — Other Ambulatory Visit: Payer: Self-pay | Admitting: Orthopedic Surgery

## 2016-11-16 DIAGNOSIS — M25511 Pain in right shoulder: Secondary | ICD-10-CM

## 2016-11-23 ENCOUNTER — Ambulatory Visit
Admission: RE | Admit: 2016-11-23 | Discharge: 2016-11-23 | Disposition: A | Discharge status: Home / Self Care | Payer: Medicare Other | Source: Ambulatory Visit | Attending: Orthopedic Surgery | Admitting: Orthopedic Surgery

## 2016-12-11 ENCOUNTER — Ambulatory Visit
Admission: EM | Admit: 2016-12-11 | Discharge: 2016-12-11 | Disposition: A | Discharge status: Home / Self Care | Payer: Medicare Other | Attending: Emergency Medicine | Admitting: Emergency Medicine

## 2016-12-11 ENCOUNTER — Other Ambulatory Visit: Payer: Self-pay

## 2016-12-11 DIAGNOSIS — Z85528 Personal history of other malignant neoplasm of kidney: Secondary | ICD-10-CM | POA: Insufficient documentation

## 2016-12-11 DIAGNOSIS — Z79899 Other long term (current) drug therapy: Secondary | ICD-10-CM | POA: Diagnosis not present

## 2016-12-11 DIAGNOSIS — Z91013 Allergy to seafood: Secondary | ICD-10-CM | POA: Insufficient documentation

## 2016-12-11 DIAGNOSIS — I739 Peripheral vascular disease, unspecified: Secondary | ICD-10-CM | POA: Insufficient documentation

## 2016-12-11 DIAGNOSIS — N898 Other specified noninflammatory disorders of vagina: Secondary | ICD-10-CM | POA: Diagnosis not present

## 2016-12-11 DIAGNOSIS — R300 Dysuria: Secondary | ICD-10-CM | POA: Diagnosis not present

## 2016-12-11 DIAGNOSIS — Z880 Allergy status to penicillin: Secondary | ICD-10-CM | POA: Diagnosis not present

## 2016-12-11 DIAGNOSIS — J45909 Unspecified asthma, uncomplicated: Secondary | ICD-10-CM | POA: Insufficient documentation

## 2016-12-11 DIAGNOSIS — Z7982 Long term (current) use of aspirin: Secondary | ICD-10-CM | POA: Diagnosis not present

## 2016-12-11 DIAGNOSIS — I10 Essential (primary) hypertension: Secondary | ICD-10-CM | POA: Insufficient documentation

## 2016-12-11 LAB — URINALYSIS, COMPLETE (UACMP) WITH MICROSCOPIC
Bilirubin Urine: NEGATIVE
GLUCOSE, UA: NEGATIVE mg/dL
KETONES UR: NEGATIVE mg/dL
LEUKOCYTES UA: NEGATIVE
Nitrite: NEGATIVE
PROTEIN: NEGATIVE mg/dL
Specific Gravity, Urine: 1.02 (ref 1.005–1.030)
pH: 6 (ref 5.0–8.0)

## 2016-12-11 LAB — WET PREP, GENITAL
CLUE CELLS WET PREP: NONE SEEN
Sperm: NONE SEEN
Trich, Wet Prep: NONE SEEN
WBC, Wet Prep HPF POC: NONE SEEN
YEAST WET PREP: NONE SEEN

## 2016-12-11 MED ORDER — TRIAMCINOLONE ACETONIDE 0.1 % EX CREA: 1.0000 "application " | TOPICAL_CREAM | Freq: Two times a day (BID) | CUTANEOUS | 0 refills | Status: DC

## 2016-12-11 NOTE — ED Triage Notes (Signed)
Pt reports being seen here for same on Oct 22 and given medication which helped the itching but "everytime I take a bath, it starts itching again."  Also reports urinary frequency. Denies vaginal discharge. Denies having any sexual intercourse.

## 2016-12-11 NOTE — ED Provider Notes (Signed)
HPI  SUBJECTIVE:  Janet [name] is a 66 y.o. female who presents with 1 month of vaginal itching.  She was seen here on 10/22 for similar symptoms, thought to possibly have a yeast infection, and was treated with 2 tabs of Diflucan which patient states made her feel better but the itching never completely resolved.  She reports dysuria after scratching herself but denies dysuria at any other time.  Symptoms are worse with taking a bath, better with scratching.  She tried Diflucan x2.  She denies vaginal pain, labial swelling, erythema, genital rash, odor, vaginal discharge.  No abdominal, back, pelvic pain.  No urinary urgency, frequency, cloudy odorous urine, hematuria.  No recent antibiotics.  She has not been sexually active in over 15 years.  STDs are not a concern today.  She does not take bubble baths or use perfumed body washes.  She has a past medical history of hypertension and kidney cancer, peripheral vascular disease.  No history of diabetes, gonorrhea, chlamydia, HIV, HSV, syphilis, Trichomonas, BV, yeast, atrophic vaginitis.  PMD: Dr. Lennox Grumbles.   Past Medical History:  Diagnosis Date  . Asthma   . Cancer ([place])    kidney  . Hypertension     Past Surgical History:  Procedure Laterality Date  . ABDOMINAL HYSTERECTOMY      Family History  Problem Relation Age of Onset  . Stroke Neg Hx     Social History   Tobacco Use  . Smoking status: Never Smoker  . Smokeless tobacco: Never Used  Substance Use Topics  . Alcohol use: No  . Drug use: No    No current facility-administered medications for this encounter.   Current Outpatient Medications:  .  albuterol (PROVENTIL HFA;VENTOLIN HFA) 108 (90 BASE) MCG/ACT inhaler, Inhale 2 puffs into the lungs every 6 (six) hours as needed for wheezing or shortness of breath., Disp: 1 Inhaler, Rfl: 0 .  aspirin EC 81 MG tablet, Take 81 mg by mouth daily., Disp: , Rfl:  .  lisinopril (PRINIVIL,ZESTRIL) 40 MG tablet, Take 1 tablet by mouth  daily., Disp: , Rfl: 0 .  naproxen (NAPROSYN) 500 MG tablet, Take 1 tablet (500 mg total) by mouth 2 (two) times daily with a meal., Disp: 60 tablet, Rfl: 0 .  potassium chloride SA (K-DUR,KLOR-CON) 20 MEQ tablet, Take 20 mEq by mouth daily. , Disp: , Rfl:  .  triamcinolone cream (KENALOG) 0.1 %, Apply 1 application topically 2 (two) times daily. As needed x 10 days max., Disp: 30 g, Rfl: 0  Allergies  Allergen Reactions  . Shellfish Allergy Anaphylaxis  . Penicillins Hives and Other (See Comments)    Has patient had a PCN reaction causing immediate rash, facial/tongue/throat swelling, SOB or lightheadedness with hypotension: no Has patient had a PCN reaction causing severe rash involving mucus membranes or skin necrosis: no Has patient had a PCN reaction that required hospitalization? no Has patient had a PCN reaction occurring within the last 10 years: no If all of the above answers are "NO", then may proceed with Cephalosporin use.      ROS  As noted in HPI.   Physical Exam  BP (!) 161/67 (BP Location: Left Arm)   Pulse 70   Temp 98 F (36.7 C) (Oral)   Resp 18   Ht 5\' 6"  (1.676 m)   Wt 213 lb (96.6 kg)   SpO2 100%   BMI 34.38 kg/m   Constitutional: Well developed, well nourished, no acute distress Eyes:  EOMI,  conjunctiva normal bilaterally HENT: Normocephalic, atraumatic,mucus membranes moist Respiratory: Normal inspiratory effort Cardiovascular: Normal rate GI: nondistended skin: No rash, skin intact Musculoskeletal: no deformities Neurologic: Alert & oriented x 3, no focal neuro deficits Psychiatric: Speech and behavior appropriate   ED Course   Medications - No data to display  Orders Placed This Encounter  Procedures  . Wet prep, genital    Standing Status:   Standing    Number of Occurrences:   1  . Urinalysis, Complete w Microscopic    Standing Status:   Standing    Number of Occurrences:   1    Results for orders placed or performed during the  hospital encounter of 12/11/16 (from the past 24 hour(s))  Urinalysis, Complete w Microscopic     Status: Abnormal   Collection Time: 12/11/16  4:01 PM  Result Value Ref Range   Color, Urine YELLOW YELLOW   APPearance CLEAR CLEAR   Specific Gravity, Urine 1.020 1.005 - 1.030   pH 6.0 5.0 - 8.0   Glucose, UA NEGATIVE NEGATIVE mg/dL   Hgb urine dipstick TRACE (A) NEGATIVE   Bilirubin Urine NEGATIVE NEGATIVE   Ketones, ur NEGATIVE NEGATIVE mg/dL   Protein, ur NEGATIVE NEGATIVE mg/dL   Nitrite NEGATIVE NEGATIVE   Leukocytes, UA NEGATIVE NEGATIVE   Squamous Epithelial / LPF 6-30 (A) NONE SEEN   WBC, UA 0-5 0 - 5 WBC/hpf   RBC / HPF 0-5 0 - 5 RBC/hpf   Bacteria, UA RARE (A) NONE SEEN  Wet prep, genital     Status: None   Collection Time: 12/11/16  4:15 PM  Result Value Ref Range   Yeast Wet Prep HPF POC NONE SEEN NONE SEEN   Trich, Wet Prep NONE SEEN NONE SEEN   Clue Cells Wet Prep HPF POC NONE SEEN NONE SEEN   WBC, Wet Prep HPF POC NONE SEEN NONE SEEN   Sperm NONE SEEN    No results found.  ED Clinical Impression  Vaginal itching   ED Assessment/Plan  Previous records reviewed as noted in HPI.  Checking UA, doing self swab wet prep.  Do not think the patient needs gonorrhea chlamydia testing.  Offered to do pelvic exam multiple times, but patient refused each time.   UA contaminated.  Rare bacteria and trace hematuria but negative nitrite, esterase.  She has no other urinary complaints except dysuria after scratching, so do not think this is a UTI.  will not treat.    Wet prep negative.  Discussed this with patient.  Again offered to do pelvic exam but again patient refused.  Discussed with her that we would not be able to evaluate for things like atrophic vaginitis, dermatitis, malignancy without doing an exam, but the patient has decided to try triamcinolone 0.1% cream and if this does not resolve her symptoms, she will come back here or follow-up with her primary care  physician for a pelvic exam at that time.  Discussed labs, MDM, plan and followup with patient.. patient agrees with plan.   Meds ordered this encounter  Medications  . triamcinolone cream (KENALOG) 0.1 %    Sig: Apply 1 application topically 2 (two) times daily. As needed x 10 days max.    Dispense:  30 g    Refill:  0    *This clinic note was created using [employer]. Therefore, there may be occasional mistakes despite careful proofreading.   ?    [name], MD 12/11/16 1719

## 2016-12-11 NOTE — Discharge Instructions (Signed)
I am giving you information on atrophic vaginitis, which may be what you have.  It is difficult to tell without an exam.  Return here or see your doctor if no better with the steroid cream in 10 days.

## 2017-01-06 ENCOUNTER — Emergency Department: Payer: Medicare Other

## 2017-01-06 ENCOUNTER — Emergency Department
Admission: EM | Admit: 2017-01-06 | Discharge: 2017-01-06 | Disposition: A | Discharge status: Home / Self Care | Payer: Medicare Other | Attending: Emergency Medicine | Admitting: Emergency Medicine

## 2017-01-06 ENCOUNTER — Other Ambulatory Visit: Payer: Self-pay

## 2017-01-06 ENCOUNTER — Encounter: Payer: Self-pay | Admitting: Emergency Medicine

## 2017-01-06 DIAGNOSIS — E876 Hypokalemia: Secondary | ICD-10-CM

## 2017-01-06 DIAGNOSIS — Z791 Long term (current) use of non-steroidal anti-inflammatories (NSAID): Secondary | ICD-10-CM | POA: Insufficient documentation

## 2017-01-06 DIAGNOSIS — Z79899 Other long term (current) drug therapy: Secondary | ICD-10-CM | POA: Diagnosis not present

## 2017-01-06 DIAGNOSIS — J45909 Unspecified asthma, uncomplicated: Secondary | ICD-10-CM | POA: Diagnosis not present

## 2017-01-06 DIAGNOSIS — Z7982 Long term (current) use of aspirin: Secondary | ICD-10-CM | POA: Insufficient documentation

## 2017-01-06 DIAGNOSIS — R42 Dizziness and giddiness: Secondary | ICD-10-CM | POA: Diagnosis present

## 2017-01-06 DIAGNOSIS — R531 Weakness: Secondary | ICD-10-CM

## 2017-01-06 DIAGNOSIS — M6281 Muscle weakness (generalized): Secondary | ICD-10-CM | POA: Diagnosis not present

## 2017-01-06 DIAGNOSIS — I10 Essential (primary) hypertension: Secondary | ICD-10-CM | POA: Diagnosis not present

## 2017-01-06 DIAGNOSIS — E860 Dehydration: Secondary | ICD-10-CM

## 2017-01-06 LAB — CBC
HEMATOCRIT: 38.2 % (ref 35.0–47.0)
HEMOGLOBIN: 12.8 g/dL (ref 12.0–16.0)
MCH: 29.2 pg (ref 26.0–34.0)
MCHC: 33.5 g/dL (ref 32.0–36.0)
MCV: 87 fL (ref 80.0–100.0)
Platelets: 316 10*3/uL (ref 150–440)
RBC: 4.39 MIL/uL (ref 3.80–5.20)
RDW: 13.2 % (ref 11.5–14.5)
WBC: 5.5 10*3/uL (ref 3.6–11.0)

## 2017-01-06 LAB — GLUCOSE, CAPILLARY: GLUCOSE-CAPILLARY: 83 mg/dL (ref 65–99)

## 2017-01-06 LAB — URINALYSIS, COMPLETE (UACMP) WITH MICROSCOPIC
BACTERIA UA: NONE SEEN
BILIRUBIN URINE: NEGATIVE
Glucose, UA: NEGATIVE mg/dL
Hgb urine dipstick: NEGATIVE
KETONES UR: NEGATIVE mg/dL
LEUKOCYTES UA: NEGATIVE
NITRITE: NEGATIVE
PROTEIN: NEGATIVE mg/dL
Specific Gravity, Urine: 1.012 (ref 1.005–1.030)
pH: 6 (ref 5.0–8.0)

## 2017-01-06 LAB — BASIC METABOLIC PANEL
ANION GAP: 6 (ref 5–15)
BUN: 13 mg/dL (ref 6–20)
CO2: 24 mmol/L (ref 22–32)
Calcium: 9.5 mg/dL (ref 8.9–10.3)
Chloride: 108 mmol/L (ref 101–111)
Creatinine, Ser: 0.7 mg/dL (ref 0.44–1.00)
GFR calc Af Amer: >60 mL/min (ref >60)
GLUCOSE: 90 mg/dL (ref 65–99)
POTASSIUM: 3.1 mmol/L — AB (ref 3.5–5.1)
Sodium: 138 mmol/L (ref 135–145)

## 2017-01-06 LAB — MAGNESIUM: Magnesium: 2.2 mg/dL (ref 1.7–2.4)

## 2017-01-06 LAB — TROPONIN I

## 2017-01-06 MED ORDER — POTASSIUM CHLORIDE CRYS ER 20 MEQ PO TBCR
40.0000 meq | EXTENDED_RELEASE_TABLET | Freq: Once | ORAL | Status: AC
  Administered 2017-01-06: 40 meq via ORAL
  Filled 2017-01-06: qty 2

## 2017-01-06 MED ORDER — SODIUM CHLORIDE 0.9 % IV BOLUS (SEPSIS)
500.0000 mL | Freq: Once | INTRAVENOUS | Status: AC
  Administered 2017-01-06: 500 mL via INTRAVENOUS

## 2017-01-06 MED ORDER — POTASSIUM CHLORIDE 10 MEQ/100ML IV SOLN
10.0000 meq | Freq: Once | INTRAVENOUS | Status: AC
  Administered 2017-01-06: 10 meq via INTRAVENOUS
  Filled 2017-01-06: qty 100

## 2017-01-06 NOTE — ED Triage Notes (Signed)
Pt here today for mild dizziness for 2 days.  Legs had also felt weak.  Pt states "I just don't feel well".  C/o mouth being dry and increased urination.  No pain per pt.  Has reports her chest feels funny sometimes.

## 2017-01-06 NOTE — ED Notes (Signed)
Pt reports increased urination and generalized weakness, denies any burning or pain with urination. Denies any pain.

## 2017-01-06 NOTE — ED Provider Notes (Signed)
[facility] Emergency Department Provider Note   ____________________________________________   First MD Initiated Contact with Patient 01/06/17 1636     (approximate)  I have reviewed the triage vital signs and the nursing notes.   HISTORY  Chief Complaint Generalized feeling of fatigue   HPI Janet [name] is a 66 y.o. female reports she has been feeling more tired than normal for the last 3-4 days.  She describes it she feels like her muscles just feel tired.  No pain.  No nausea or vomiting.  Does report she feels hungry.  She drinks [facility] regularly, but reports she does not really do anything else for hydration except occasionally will have some water.  She reports that she has been expressing fatigue for some time, but it seems to be slightly worse last few days.  She has associated a slight cough is nonproductive in nature for the last 3-4 days as well without any shortness of breath or chest pain.  Denies any focal weakness.  No trouble speaking.  No headache.  She denies "dizziness" rather describes it to me as a feeling of generalized fatigue.  Has noticed her mouth feels very dry the last few days.  She reports that she is supposed to take potassium pills at home, but only takes them on occasion, none recently.  Past Medical History:  Diagnosis Date  . Asthma   . Cancer ([place])    kidney  . Hypertension     Patient Active Problem List   Diagnosis Date Noted  . Bilateral cellulitis of lower leg 10/03/2015  . Generalized weakness 09/09/2014  . 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 ABDOMINAL HYSTERECTOMY      Prior to Admission medications   Medication Sig Start Date End Date Taking? Authorizing Provider  albuterol (PROVENTIL HFA;VENTOLIN HFA) 108 (90 BASE) MCG/ACT inhaler Inhale 2 puffs into the lungs every 6 (six) hours as needed for wheezing or shortness of breath. 12/24/14   [name], MD  aspirin EC 81 MG tablet Take 81 mg by mouth daily.    [provider]  lisinopril (PRINIVIL,ZESTRIL) 40 MG tablet Take 1 tablet by mouth daily. 08/27/14   [provider]  naproxen (NAPROSYN) 500 MG tablet Take 1 tablet (500 mg total) by mouth 2 (two) times daily with a meal. 05/15/16   Lorin Picket, PA-C  potassium chloride SA (K-DUR,KLOR-CON) 20 MEQ tablet Take 20 mEq by mouth daily.     [provider]  triamcinolone cream (KENALOG) 0.1 % Apply 1 application topically 2 (two) times daily. As needed x 10 days max. 12/11/16   [name], MD    Allergies Shellfish allergy and Penicillins  Family History  Problem Relation Age of Onset  . Stroke Neg Hx     Social History Social History   Tobacco Use  . Smoking status: Never Smoker  . Smokeless tobacco: Never Used  Substance Use Topics  . Alcohol use: No  . Drug use: No    Review of Systems Constitutional: No fever/chills Eyes: No visual changes. ENT: No sore throat. Cardiovascular: Denies chest pain. Respiratory: Denies shortness of breath. Gastrointestinal: No abdominal pain.  No nausea, no vomiting.  No diarrhea.  No constipation. Genitourinary: Negative for dysuria. Musculoskeletal: Chronic back pain for several months, unchanged. Skin: Negative for rash. Neurological: Negative for headaches, focal weakness or numbness.    ____________________________________________   PHYSICAL EXAM:  VITAL SIGNS:  ED Triage Vitals  Enc Vitals Group     BP 01/06/17 1521 (!) 176/75     Pulse Rate 01/06/17 1519 79     Resp 01/06/17 1519 16     Temp 01/06/17 1519 98.7 F (37.1 C)     Temp Source 01/06/17 1519 Oral     SpO2 01/06/17 1519 100 %     Weight 01/06/17 1519 213 lb (96.6 kg)     Height 01/06/17 1519 5\' 9"  (1.753 m)     Head Circumference --      Peak Flow --      Pain Score --      Pain Loc --      Pain Edu? --      Excl. in [place]? --     Constitutional: Alert and  oriented. Well appearing and in no acute distress.  Pleasant. Eyes: Conjunctivae are normal. Head: Atraumatic. Nose: No congestion/rhinnorhea. Mouth/Throat: Mucous membranes are dry. Neck: No stridor.   Cardiovascular: Normal rate, regular rhythm. Grossly normal heart sounds.  Good peripheral circulation. Respiratory: Normal respiratory effort.  No retractions. Lungs CTAB. Gastrointestinal: Soft and nontender. No distention. Musculoskeletal: No lower extremity tenderness nor edema.  5 out of 5 strength in all extremities. Neurologic:  Normal speech and language. No gross focal neurologic deficits are appreciated.  No focal weakness.  5 out of 5 strength in all extremities, normal sensation in all extremities.  Normal cranial nerve exam.  No ataxia.  No visual deficits.  Normal and clear speech and language.  No pronator drift in any extremity. Skin:  Skin is warm, dry and intact. No rash noted. Psychiatric: Mood and affect are normal. Speech and behavior are normal.  ____________________________________________   LABS (all labs ordered are listed, but only abnormal results are displayed)  Labs Reviewed  BASIC METABOLIC PANEL - Abnormal; Notable for the following components:      Result Value   Potassium 3.1 (*)    All other components within normal limits  URINALYSIS, COMPLETE (UACMP) WITH MICROSCOPIC - Abnormal; Notable for the following components:   Color, Urine YELLOW (*)    APPearance CLEAR (*)    Squamous Epithelial / LPF 0-5 (*)    All other components within normal limits  CBC  TROPONIN I  GLUCOSE, CAPILLARY  MAGNESIUM  CBG MONITORING, ED   ____________________________________________  EKG  ED ECG REPORT I, QUALE, MARK, the attending physician, personally viewed and interpreted this ECG.  Date: 01/06/2017 EKG Time: 1530 Rate: 80 Rhythm: normal sinus rhythm QRS Axis: normal Intervals: normal ST/T Wave abnormalities: normal Narrative Interpretation: no evidence  of acute ischemia  ____________________________________________  RADIOLOGY  Dg Chest 2 View  Result Date: 01/06/2017 CLINICAL DATA:  Patient reports general fatigue for the past 3 days. Denies SOB, cough, fever or CP. Hx asthma, HTN. Non-smoker. EXAM: CHEST  2 VIEW COMPARISON:  03/30/2016 FINDINGS: Cardiac silhouette is normal size. Normal mediastinal and hilar contours. Mild linear atelectasis at the right lateral lung base. Lungs otherwise clear. No pleural effusion or pneumothorax. Skeletal structures are intact. IMPRESSION: No active cardiopulmonary disease. Electronically Signed   By: Lajean Manes M.D.   On: 01/06/2017 17:09  Chest x-ray reviewed, no acute  ____________________________________________   PROCEDURES  Procedure(s) performed: None  Procedures  Critical Care performed: No  ____________________________________________   INITIAL IMPRESSION / ASSESSMENT AND PLAN / ED COURSE  Pertinent labs & imaging results that were available during my care of the patient were reviewed by me and considered  in my medical decision making (see chart for details).  Patient reports increased generalized fatigue.  It sounds as though she has some chronic fatigue at baseline but seems to worsen the last 3-4 days.  The only symptom she seems really associate with this is a slight dry productive cough without any shortness of breath.  She does not appear to have any difficulty in breathing, denies any chest pain.  Lungs are clear and speaking in full clear sentences.  Will obtain a chest x-ray to exclude abnormality/pneumonia though clinically I find the suspicion to be very low.  She does have however some hypokalemia which seems to be a chronic process for her but slightly worse today which may be contributing to his feeling of fatigue.  Also suspect she is not hydrating well using primarily caffeinated beverages for hydration.  We will give her some IV fluid here she does appear slightly dry  by exam, replete potassium and check magnesium.  Plan to reevaluate her thereafter to see how she is feeling.  No central neurologic abnormalities, do not believe the patient would benefit from a CT or an MRI of the brain at this time she has no focal complaints associated.  No acute cardiac or pulmonary symptoms except for slight cough.     ----------------------------------------- 7:22 PM on 01/06/2017 -----------------------------------------  Patient reports she feels much better.  Resting comfortably in no distress.  Potassium is repleted, patient reports improvement.  Discussed with patient, she is agreeable to close follow-up with her primary doctor.  Appears appropriate for ongoing outpatient care.  Return precautions and treatment recommendations and follow-up discussed with the patient who is agreeable with the plan.   ____________________________________________   FINAL CLINICAL IMPRESSION(S) / ED DIAGNOSES  Final diagnoses:  Dehydration  General weakness  Hypokalemia      NEW MEDICATIONS STARTED DURING THIS VISIT:  This SmartLink is deprecated. Use AVSMEDLIST instead to display the medication list for a patient.   Note:  This document was prepared using Dragon voice recognition software and may include unintentional dictation errors.     Delman Kitten, MD 01/06/17 [phone]

## 2017-02-20 ENCOUNTER — Emergency Department: Payer: Medicare Other

## 2017-02-20 ENCOUNTER — Emergency Department
Admission: EM | Admit: 2017-02-20 | Discharge: 2017-02-20 | Disposition: A | Discharge status: Home / Self Care | Payer: Medicare Other | Attending: Emergency Medicine | Admitting: Emergency Medicine

## 2017-02-20 ENCOUNTER — Encounter: Payer: Self-pay | Admitting: Emergency Medicine

## 2017-02-20 DIAGNOSIS — J45909 Unspecified asthma, uncomplicated: Secondary | ICD-10-CM | POA: Diagnosis not present

## 2017-02-20 DIAGNOSIS — Z85528 Personal history of other malignant neoplasm of kidney: Secondary | ICD-10-CM | POA: Diagnosis not present

## 2017-02-20 DIAGNOSIS — Z79899 Other long term (current) drug therapy: Secondary | ICD-10-CM | POA: Diagnosis not present

## 2017-02-20 DIAGNOSIS — R002 Palpitations: Secondary | ICD-10-CM | POA: Insufficient documentation

## 2017-02-20 DIAGNOSIS — G44209 Tension-type headache, unspecified, not intractable: Secondary | ICD-10-CM | POA: Insufficient documentation

## 2017-02-20 DIAGNOSIS — R42 Dizziness and giddiness: Secondary | ICD-10-CM | POA: Insufficient documentation

## 2017-02-20 DIAGNOSIS — Z7982 Long term (current) use of aspirin: Secondary | ICD-10-CM | POA: Insufficient documentation

## 2017-02-20 DIAGNOSIS — I10 Essential (primary) hypertension: Secondary | ICD-10-CM | POA: Diagnosis not present

## 2017-02-20 LAB — CBC
HEMATOCRIT: 39.8 % (ref 35.0–47.0)
HEMOGLOBIN: 12.7 g/dL (ref 12.0–16.0)
MCH: 28 pg (ref 26.0–34.0)
MCHC: 32 g/dL (ref 32.0–36.0)
MCV: 87.5 fL (ref 80.0–100.0)
Platelets: 357 10*3/uL (ref 150–440)
RBC: 4.55 MIL/uL (ref 3.80–5.20)
RDW: 13.8 % (ref 11.5–14.5)
WBC: 6.1 10*3/uL (ref 3.6–11.0)

## 2017-02-20 LAB — URINALYSIS, COMPLETE (UACMP) WITH MICROSCOPIC
BILIRUBIN URINE: NEGATIVE
Bacteria, UA: NONE SEEN
GLUCOSE, UA: NEGATIVE mg/dL
HGB URINE DIPSTICK: NEGATIVE
Ketones, ur: NEGATIVE mg/dL
LEUKOCYTES UA: NEGATIVE
NITRITE: NEGATIVE
PROTEIN: NEGATIVE mg/dL
Specific Gravity, Urine: 1.011 (ref 1.005–1.030)
pH: 5 (ref 5.0–8.0)

## 2017-02-20 LAB — BASIC METABOLIC PANEL
ANION GAP: 8 (ref 5–15)
BUN: 18 mg/dL (ref 6–20)
CALCIUM: 9.6 mg/dL (ref 8.9–10.3)
CO2: 25 mmol/L (ref 22–32)
Chloride: 104 mmol/L (ref 101–111)
Creatinine, Ser: 0.98 mg/dL (ref 0.44–1.00)
GFR calc Af Amer: >60 mL/min (ref >60)
GFR, EST NON AFRICAN AMERICAN: 59 mL/min — AB (ref >60)
Glucose, Bld: 96 mg/dL (ref 65–99)
POTASSIUM: 3.4 mmol/L — AB (ref 3.5–5.1)
Sodium: 137 mmol/L (ref 135–145)

## 2017-02-20 LAB — TROPONIN I: Troponin I: <0.03 ng/mL (ref <0.03)

## 2017-02-20 NOTE — ED Triage Notes (Signed)
Pt comes into the ED via POV c/o dizziness that has been intermittent x 1 month.  Patient states she feels lightheaded and feels as thought she is going to pass out.  Patient states she feels as though her heart is beating faster than it should be but denies any chest pain, N/V.  Patient has even and unlabored respirations and is in NAD at this time with dry and warm skin.

## 2017-02-20 NOTE — ED Provider Notes (Signed)
[facility] Emergency Department Provider Note   ____________________________________________   First MD Initiated Contact with Patient 02/20/17 1741     (approximate)  I have reviewed the triage vital signs and the nursing notes.   HISTORY  Chief Complaint Dizziness    HPI Janet [name] is a 67 y.o. female who comes in complaining of intermittent episodes of what she feels is a rapid heartbeat and lightheadedness. She had one today she thought she would pass out. She is not having the symptoms now and EKG is normal monitor is normal labs are normal. Patient also reports she has intermittent bad headaches for the last 2 months. They come on sometimes while she is talking "real bad" and then go away if she takes something for them. She cannot specify how often they happen. She cannot tell me how long they have been at school when she takes for it. There is achy in nature.   Past Medical History:  Diagnosis Date  . Asthma   . Cancer ([place])    kidney  . Hypertension     Patient Active Problem List   Diagnosis Date Noted  . Bilateral cellulitis of lower leg 10/03/2015  . Generalized weakness 09/09/2014  . 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 ABDOMINAL HYSTERECTOMY      Prior to Admission medications   Medication Sig Start Date End Date Taking? Authorizing Provider  albuterol (PROVENTIL HFA;VENTOLIN HFA) 108 (90 BASE) MCG/ACT inhaler Inhale 2 puffs into the lungs every 6 (six) hours as needed for wheezing or shortness of breath. 12/24/14   [name], MD  aspirin EC 81 MG tablet Take 81 mg by mouth daily.    [provider]  lisinopril (PRINIVIL,ZESTRIL) 40 MG tablet Take 1 tablet by mouth daily. 08/27/14   [provider]  naproxen (NAPROSYN) 500 MG tablet Take 1 tablet (500 mg total) by mouth 2 (two) times daily with a meal. 05/15/16   Lorin Picket, PA-C  potassium chloride  SA (K-DUR,KLOR-CON) 20 MEQ tablet Take 20 mEq by mouth daily.     [provider]  triamcinolone cream (KENALOG) 0.1 % Apply 1 application topically 2 (two) times daily. As needed x 10 days max. 12/11/16   [name], MD    Allergies Shellfish allergy and Penicillins  Family History  Problem Relation Age of Onset  . Stroke Neg Hx     Social History Social History   Tobacco Use  . Smoking status: Never Smoker  . Smokeless tobacco: Never Used  Substance Use Topics  . Alcohol use: No  . Drug use: No    Review of Systems  Constitutional: No fever/chills Eyes: No visual changes. ENT: No sore throat. Cardiovascular: Denies chest pain. Respiratory: Denies shortness of breath. Gastrointestinal: No abdominal pain.  No nausea, no vomiting.  No diarrhea.  No constipation. Genitourinary: Negative for dysuria. Musculoskeletal: Negative for back pain. Skin: Negative for rash. Neurological: Negative for headaches at present, focal weakness   ____________________________________________   PHYSICAL EXAM:  VITAL SIGNS: ED Triage Vitals [02/20/17 1500]  Enc Vitals Group     BP      Pulse      Resp      Temp      Temp src      SpO2      Weight 212 lb (96.2 kg)     Height 5\' 9"  (1.753 m)     Head Circumference  Peak Flow      Pain Score      Pain Loc      Pain Edu?      Excl. in [place]?     Constitutional: Alert and oriented. Well appearing and in no acute distress. Eyes: Conjunctivae are normal. PERRL. EOMI.fundi are somewhat hard to see but look normal Head: Atraumatic. Nose: No congestion/rhinnorhea. Mouth/Throat: Mucous membranes are moist.  Oropharynx non-erythematous. Neck: No stridor. Cardiovascular: Normal rate, regular rhythm. Grossly normal heart sounds.  Good peripheral circulation. Respiratory: Normal respiratory effort.  No retractions. Lungs CTAB. Gastrointestinal: Soft and nontender. No distention. No abdominal bruits. No CVA  tenderness. Musculoskeletal: No lower extremity tenderness nor edema.  No joint effusions. Neurologic:  Normal speech and language. No gross focal neurologic deficits are appreciated.specifically cranial nerves II through XII are intact. Visual fields were not checked cerebellar finger to nose and rapid alternating movements in the hand and normal motor strength is 5 over 5 throughout patient does not have any numbness. No gait instability. Skin:  Skin is warm, dry and intact. No rash noted. Psychiatric: Mood and affect are normal. Speech and behavior are normal.  ____________________________________________   LABS (all labs ordered are listed, but only abnormal results are displayed)  Labs Reviewed  BASIC METABOLIC PANEL - Abnormal; Notable for the following components:      Result Value   Potassium 3.4 (*)    GFR calc non Af Amer 59 (*)    All other components within normal limits  URINALYSIS, COMPLETE (UACMP) WITH MICROSCOPIC - Abnormal; Notable for the following components:   Color, Urine STRAW (*)    APPearance CLEAR (*)    Squamous Epithelial / LPF 0-5 (*)    All other components within normal limits  CBC  TROPONIN I   ____________________________________________  EKG  EKG read and interpreted by me shows normal sinus rhythm rate of 79 normal axis normal EKG ____________________________________________  RADIOLOGY  ED MD interpretation:  Ct head nl per rads radiology reads chest x-ray as no acute disease I agree Official radiology report(s): Ct Head Wo Contrast  Result Date: 02/20/2017 CLINICAL DATA:  Dizziness which is been intermittent over the last month. Near syncope. EXAM: CT HEAD WITHOUT CONTRAST TECHNIQUE: Contiguous axial images were obtained from the base of the skull through the vertex without intravenous contrast. COMPARISON:  None. FINDINGS: Brain: The brain shows a normal appearance without evidence of malformation, atrophy, old or acute small or large vessel  infarction, mass lesion, hemorrhage, hydrocephalus or extra-axial collection. Vascular: No hyperdense vessel. No evidence of atherosclerotic calcification. Skull: Normal.  No traumatic finding.  No focal bone lesion. Sinuses/Orbits: Sinuses are clear. Orbits appear normal. Mastoids are clear. Other: None significant IMPRESSION: Normal head CT. Electronically Signed   By: Nelson Chimes M.D.   On: 02/20/2017 18:16   Dg Chest Portable 1 View  Result Date: 02/20/2017 CLINICAL DATA:  Dizziness intermittently for 1 month, history hypertension, renal cancer, asthma EXAM: PORTABLE CHEST 1 VIEW COMPARISON:  Portable exam 1805 hours compared to 01/06/2017 FINDINGS: Borderline enlargement of cardiac silhouette. Mediastinal contours and pulmonary vascularity normal. Lungs grossly clear. No infiltrate, pleural effusion or pneumothorax. Bones appear demineralized. IMPRESSION: No acute abnormalities. Electronically Signed   By: Lavonia [name] M.D.   On: 02/20/2017 18:20    ____________________________________________   PROCEDURES  Procedure(s) performed:   Procedures  Critical Care performed:   ____________________________________________   INITIAL IMPRESSION / ASSESSMENT AND PLAN / ED COURSE patient insisting on the CT  of her head and tried to talk her out of it and explained about the radiation danger but then she says well the headache she's been having only for the last 2 months, when she never had headaches like this before so I will go ahead and CT her although I have warned her about the injuries and radiation.      ____________________________________________   FINAL CLINICAL IMPRESSION(S) / ED DIAGNOSES  Final diagnoses:  Dizziness  Palpitations  Tension-type headache, not intractable, unspecified chronicity pattern     ED Discharge Orders    None       Note:  This document was prepared using Dragon voice recognition software and may include unintentional dictation errors.     Nena Polio, MD 02/20/17 [phone]

## 2017-02-20 NOTE — ED Notes (Signed)
Pt states that she has been having episodes of "swimmy headed" feeling multiple times per week. Reports that she feels like she is spinning, has to hold onto things when walking during these episodes. Pt alert and oriented X4, active, cooperative, pt in NAD. RR even and unlabored, color WNL.

## 2017-02-20 NOTE — Discharge Instructions (Signed)
Please give the cardiologist,Dr. [facility] office a call in the morning. Let's them know you're in the ER with palpitations. They should be able to see you very soon. please return here if you're worse. Head CT was normal as was a chest xray and lab work.

## 2017-02-20 NOTE — ED Notes (Signed)
Pt alert and oriented X4, active, cooperative, pt in NAD. RR even and unlabored, color WNL.  Pt informed to return if any life threatening symptoms occur.  Discharge and followup instructions reviewed.  

## 2017-02-20 NOTE — ED Notes (Signed)
ED Provider at bedside. 

## 2017-04-15 ENCOUNTER — Other Ambulatory Visit: Payer: Self-pay

## 2017-04-15 ENCOUNTER — Emergency Department
Admission: EM | Admit: 2017-04-15 | Discharge: 2017-04-15 | Disposition: A | Discharge status: Home / Self Care | Payer: Medicare Other | Attending: Emergency Medicine | Admitting: Emergency Medicine

## 2017-04-15 ENCOUNTER — Emergency Department: Payer: Medicare Other

## 2017-04-15 ENCOUNTER — Encounter: Payer: Self-pay | Admitting: *Deleted

## 2017-04-15 DIAGNOSIS — Z79899 Other long term (current) drug therapy: Secondary | ICD-10-CM | POA: Diagnosis not present

## 2017-04-15 DIAGNOSIS — Z7982 Long term (current) use of aspirin: Secondary | ICD-10-CM | POA: Insufficient documentation

## 2017-04-15 DIAGNOSIS — R42 Dizziness and giddiness: Secondary | ICD-10-CM | POA: Diagnosis present

## 2017-04-15 DIAGNOSIS — I10 Essential (primary) hypertension: Secondary | ICD-10-CM | POA: Insufficient documentation

## 2017-04-15 DIAGNOSIS — Z85528 Personal history of other malignant neoplasm of kidney: Secondary | ICD-10-CM | POA: Diagnosis not present

## 2017-04-15 LAB — CBC
HEMATOCRIT: 36.9 % (ref 35.0–47.0)
Hemoglobin: 12 g/dL (ref 12.0–16.0)
MCH: 28.4 pg (ref 26.0–34.0)
MCHC: 32.5 g/dL (ref 32.0–36.0)
MCV: 87.3 fL (ref 80.0–100.0)
Platelets: 303 10*3/uL (ref 150–440)
RBC: 4.22 MIL/uL (ref 3.80–5.20)
RDW: 14.1 % (ref 11.5–14.5)
WBC: 9.7 10*3/uL (ref 3.6–11.0)

## 2017-04-15 LAB — TROPONIN I: Troponin I: <0.03 ng/mL (ref <0.03)

## 2017-04-15 LAB — BASIC METABOLIC PANEL
Anion gap: 8 (ref 5–15)
BUN: 17 mg/dL (ref 6–20)
CALCIUM: 9.9 mg/dL (ref 8.9–10.3)
CO2: 25 mmol/L (ref 22–32)
Chloride: 105 mmol/L (ref 101–111)
Creatinine, Ser: 0.97 mg/dL (ref 0.44–1.00)
GFR calc Af Amer: >60 mL/min (ref >60)
GFR, EST NON AFRICAN AMERICAN: 59 mL/min — AB (ref >60)
GLUCOSE: 109 mg/dL — AB (ref 65–99)
Potassium: 4.3 mmol/L (ref 3.5–5.1)
Sodium: 138 mmol/L (ref 135–145)

## 2017-04-15 MED ORDER — MECLIZINE HCL 12.5 MG PO TABS: 12.5000 mg | ORAL_TABLET | Freq: Three times a day (TID) | ORAL | 0 refills | Status: DC | PRN

## 2017-04-15 MED ORDER — MECLIZINE HCL 25 MG PO TABS
12.5000 mg | ORAL_TABLET | Freq: Once | ORAL | Status: AC
  Administered 2017-04-15: 12.5 mg via ORAL
  Filled 2017-04-15: qty 1

## 2017-04-15 MED ORDER — ONDANSETRON 4 MG PO TBDP: 4.0000 mg | ORAL_TABLET | Freq: Four times a day (QID) | ORAL | 0 refills | Status: DC | PRN

## 2017-04-15 NOTE — ED Notes (Signed)

## 2017-04-15 NOTE — ED Notes (Signed)
Patient transported to CT 

## 2017-04-15 NOTE — ED Notes (Addendum)
Pt states 5 episodes diarrhea yesterday, resolved without tx. Pt states 2 episodes vomiting today and feeling dizzy. No emesis since arrival

## 2017-04-15 NOTE — Discharge Instructions (Signed)
We believe your symptoms were caused by benign vertigo.  Please read through the included information and take any prescribed medication(s).  Follow up with your doctor as listed above.  If you develop any new or worsening symptoms that concern you, including but not limited to persistent dizziness/vertigo, numbness or weakness in your arms or legs, altered mental status, persistent vomiting, or fever greater than 101, please return immediately to the Emergency Department.  

## 2017-04-15 NOTE — ED Provider Notes (Signed)
[facility] Emergency Department Provider Note   ____________________________________________   First MD Initiated Contact with Patient 04/15/17 2151     (approximate)  I have reviewed the triage vital signs and the nursing notes.   HISTORY  Chief Complaint Dizziness    HPI Janet [name] is a 67 y.o. female.  History of asthma, kidney disease hypertension  Patient reports that she was at work today she began feeling lightheaded, she reports she has a very busying feeling of a spinning sensation.  She left work, went home, reports that at home she began have another episode of the dizziness, described as feeling like she was spinning again.  This made her nauseated and she began vomiting.  No fevers or chills.  No headache.  No numbness tingling or weakness in the arms or legs.  She does report she had a similar feeling about 2 months ago and was seen in the emergency room then as well.  Otherwise reports in her normal state of health.  Again no chest pain, no numbness, no weakness in the face, no trouble speaking.  No weakness in arms or legs.  She has no reports that the patient had chest pain, when I discussed with her she reports she did not have any chest pain during these episodes.  She certainly reports she is not have any chest discomfort now  She has not taken any medication prior to arrival.   Past Medical History:  Diagnosis Date  . Asthma   . Cancer ([place])    kidney  . Hypertension     Patient Active Problem List   Diagnosis Date Noted  . Bilateral cellulitis of lower leg 10/03/2015  . Generalized weakness 09/09/2014  . 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 ABDOMINAL HYSTERECTOMY      Prior to Admission medications   Medication Sig Start Date End Date Taking? Authorizing Provider  albuterol (PROVENTIL HFA;VENTOLIN HFA) 108 (90 BASE) MCG/ACT inhaler Inhale 2 puffs into the lungs every  6 (six) hours as needed for wheezing or shortness of breath. 12/24/14   [name], MD  aspirin EC 81 MG tablet Take 81 mg by mouth daily.    [provider]  lisinopril (PRINIVIL,ZESTRIL) 40 MG tablet Take 1 tablet by mouth daily. 08/27/14   [provider]  meclizine (ANTIVERT) 12.5 MG tablet Take 1 tablet (12.5 mg total) by mouth 3 (three) times daily as needed for dizziness or nausea. 04/15/17   Delman Kitten, MD  naproxen (NAPROSYN) 500 MG tablet Take 1 tablet (500 mg total) by mouth 2 (two) times daily with a meal. 05/15/16   Lorin Picket, PA-C  ondansetron (ZOFRAN ODT) 4 MG disintegrating tablet Take 1 tablet (4 mg total) by mouth every 6 (six) hours as needed for nausea or vomiting. 04/15/17   Delman Kitten, MD  potassium chloride SA (K-DUR,KLOR-CON) 20 MEQ tablet Take 20 mEq by mouth daily.     [provider]  triamcinolone cream (KENALOG) 0.1 % Apply 1 application topically 2 (two) times daily. As needed x 10 days max. 12/11/16   [name], MD    Allergies Shellfish allergy and Penicillins  Family History  Problem Relation Age of Onset  . Stroke Neg Hx     Social History Social History   Tobacco Use  . Smoking status: Never Smoker  . Smokeless tobacco: Never Used  Substance Use Topics  . Alcohol use: No  .  Drug use: No    Review of Systems Constitutional: No fever/chills Eyes: No visual changes. ENT: No sore throat. Cardiovascular: Denies chest pain. Respiratory: Denies shortness of breath. Gastrointestinal: No abdominal pain.   No diarrhea.  No constipation. Genitourinary: Negative for dysuria. Musculoskeletal: Negative for back pain. Skin: Negative for rash. Neurological: Negative for headaches, focal weakness or numbness.  See HPI    ____________________________________________   PHYSICAL EXAM:  VITAL SIGNS: ED Triage Vitals  Enc Vitals Group     BP 04/15/17 1721 137/78     Pulse Rate 04/15/17 1721 82     Resp  04/15/17 1721 20     Temp 04/15/17 1721 97.8 F (36.6 C)     Temp Source 04/15/17 1721 Oral     SpO2 04/15/17 1721 97 %     Weight 04/15/17 1721 212 lb (96.2 kg)     Height 04/15/17 1721 5\' 9"  (1.753 m)     Head Circumference --      Peak Flow --      Pain Score 04/15/17 2200 2     Pain Loc --      Pain Edu? --      Excl. in [place]? --     Constitutional: Alert and oriented. Well appearing and in no acute distress. Eyes: Conjunctivae are normal. Head: Atraumatic. Nose: No congestion/rhinnorhea. Mouth/Throat: Mucous membranes are moist.  Tympanic membranes and external auditory canals are normal bilaterally. Neck: No stridor.   Cardiovascular: Normal rate, regular rhythm. Grossly normal heart sounds.  Good peripheral circulation. Respiratory: Normal respiratory effort.  No retractions. Lungs CTAB. Gastrointestinal: Soft and nontender. No distention. Musculoskeletal: No lower extremity tenderness nor edema. Neurologic:  Normal speech and language. No gross focal neurologic deficits are appreciated.  No pronator drift.  5 out of 5 strength in all extremities.  Cranial nerve exam is normal.  Patient is able to sit up, does not currently endorse any dizziness.  Reports she seems to feel much better.  There is no ataxia.  Speech is clear.  No sensory loss or deficit over the bilateral arms face and legs. Skin:  Skin is warm, dry and intact. No rash noted. Psychiatric: Mood and affect are normal. Speech and behavior are normal.  ____________________________________________   LABS (all labs ordered are listed, but only abnormal results are displayed)  Labs Reviewed  BASIC METABOLIC PANEL - Abnormal; Notable for the following components:      Result Value   Glucose, Bld 109 (*)    GFR calc non Af Amer 59 (*)    All other components within normal limits  CBC  TROPONIN I  TROPONIN I   ____________________________________________  EKG  ED ECG REPORT I, Delman Kitten, the attending  physician, personally viewed and interpreted this ECG.  Date: 04/15/2017 EKG Time: 1730 Rate: 80 Rhythm: normal sinus rhythm QRS Axis: normal Intervals: normal ST/T Wave abnormalities: normal Narrative Interpretation: no evidence of acute ischemia  ____________________________________________  RADIOLOGY   Reviewed by me CT head negative for acute.  Chest x-ray no acute findings except for noted atelectasis. ____________________________________________   PROCEDURES  Procedure(s) performed: None  Procedures  Critical Care performed: No ____________________________________________   INITIAL IMPRESSION / ASSESSMENT AND PLAN / ED COURSE  Pertinent labs & imaging results that were available during my care of the patient were reviewed by me and considered in my medical decision making (see chart for details).  Patient presents for evaluation of dizzy episode.  Her symptoms sound very much like  vertigo.  She does have a history of hypertension, but at the very reassuring neurologic exam with no evidence of any deficits.  No associated cardiac or pulmonary symptoms reported, but the patient did note to triage that she had chest pain.  Her EKG is reassuring.  She had 2- troponins her head CT is negative for acute disease.  No evidence of any intracranial hemorrhage, this was performed as the patient's blood pressure was noted to be elevated.  No associated infectious symptoms or abdominal pain.  At the present time reassuring examination with improvement in clinical status.  Discussed with patient and her husband, we will treat her with prescription for as needed Antivert as well as prescribe her a prescription for Zofran for what I suspect is peripheral vertigo.  Reviewed careful return precautions.  Patient's husband drive her home.      ____________________________________________   FINAL CLINICAL IMPRESSION(S) / ED DIAGNOSES  Final diagnoses:  Vertigo  Dizziness       NEW MEDICATIONS STARTED DURING THIS VISIT:  Discharge Medication List as of 04/15/2017 10:51 PM    START taking these medications   Details  meclizine (ANTIVERT) 12.5 MG tablet Take 1 tablet (12.5 mg total) by mouth 3 (three) times daily as needed for dizziness or nausea., Starting Mon 04/15/2017, Print    ondansetron (ZOFRAN ODT) 4 MG disintegrating tablet Take 1 tablet (4 mg total) by mouth every 6 (six) hours as needed for nausea or vomiting., Starting Mon 04/15/2017, Print         Note:  This document was prepared using Dragon voice recognition software and may include unintentional dictation errors.     Delman Kitten, MD 04/16/17 [phone]

## 2017-04-15 NOTE — ED Triage Notes (Signed)
Pt to triage via wheelchair   Pt reports feeling dizzy at work today and started having chest pain.  Pt also has n/v.  nonradiating pain .   Pt alert  Speech clear.  Iv in place.

## 2017-06-23 ENCOUNTER — Other Ambulatory Visit: Payer: Self-pay

## 2017-06-23 ENCOUNTER — Emergency Department: Payer: Medicare Other

## 2017-06-23 DIAGNOSIS — R0789 Other chest pain: Secondary | ICD-10-CM | POA: Diagnosis not present

## 2017-06-23 DIAGNOSIS — J45909 Unspecified asthma, uncomplicated: Secondary | ICD-10-CM | POA: Insufficient documentation

## 2017-06-23 DIAGNOSIS — I10 Essential (primary) hypertension: Secondary | ICD-10-CM | POA: Diagnosis not present

## 2017-06-23 DIAGNOSIS — Z7982 Long term (current) use of aspirin: Secondary | ICD-10-CM | POA: Insufficient documentation

## 2017-06-23 DIAGNOSIS — Z79899 Other long term (current) drug therapy: Secondary | ICD-10-CM | POA: Diagnosis not present

## 2017-06-23 DIAGNOSIS — M79605 Pain in left leg: Secondary | ICD-10-CM | POA: Diagnosis not present

## 2017-06-23 DIAGNOSIS — R079 Chest pain, unspecified: Secondary | ICD-10-CM | POA: Diagnosis present

## 2017-06-23 LAB — CBC
HEMATOCRIT: 41.5 % (ref 35.0–47.0)
HEMOGLOBIN: 14 g/dL (ref 12.0–16.0)
MCH: 29.3 pg (ref 26.0–34.0)
MCHC: 33.8 g/dL (ref 32.0–36.0)
MCV: 86.6 fL (ref 80.0–100.0)
Platelets: 351 10*3/uL (ref 150–440)
RBC: 4.79 MIL/uL (ref 3.80–5.20)
RDW: 13.4 % (ref 11.5–14.5)
WBC: 7 10*3/uL (ref 3.6–11.0)

## 2017-06-23 LAB — BASIC METABOLIC PANEL
ANION GAP: 8 (ref 5–15)
BUN: 19 mg/dL (ref 6–20)
CALCIUM: 9.7 mg/dL (ref 8.9–10.3)
CO2: 23 mmol/L (ref 22–32)
Chloride: 104 mmol/L (ref 101–111)
Creatinine, Ser: 0.84 mg/dL (ref 0.44–1.00)
GFR calc Af Amer: >60 mL/min (ref >60)
GLUCOSE: 112 mg/dL — AB (ref 65–99)
POTASSIUM: 3.7 mmol/L (ref 3.5–5.1)
Sodium: 135 mmol/L (ref 135–145)

## 2017-06-23 LAB — TROPONIN I: Troponin I: <0.03 ng/mL (ref <0.03)

## 2017-06-23 NOTE — ED Triage Notes (Signed)
Patient reports tonight noted chest tightness at approximately 8 pm.  Reports pain worse with taking a deep breath or coughing.  Patient also reports a pain in her left lower leg.

## 2017-06-24 ENCOUNTER — Emergency Department: Payer: Medicare Other

## 2017-06-24 ENCOUNTER — Encounter: Payer: Self-pay | Admitting: Radiology

## 2017-06-24 ENCOUNTER — Emergency Department
Admission: EM | Admit: 2017-06-24 | Discharge: 2017-06-24 | Disposition: A | Discharge status: Home / Self Care | Payer: Medicare Other | Attending: Emergency Medicine | Admitting: Emergency Medicine

## 2017-06-24 DIAGNOSIS — M79605 Pain in left leg: Secondary | ICD-10-CM

## 2017-06-24 DIAGNOSIS — R079 Chest pain, unspecified: Secondary | ICD-10-CM

## 2017-06-24 DIAGNOSIS — R0789 Other chest pain: Secondary | ICD-10-CM

## 2017-06-24 DIAGNOSIS — M7989 Other specified soft tissue disorders: Secondary | ICD-10-CM

## 2017-06-24 LAB — TROPONIN I: Troponin I: <0.03 ng/mL (ref <0.03)

## 2017-06-24 LAB — FIBRIN DERIVATIVES D-DIMER (ARMC ONLY): FIBRIN DERIVATIVES D-DIMER (ARMC): 3323.99 ng{FEU}/mL — AB (ref 0.00–499.00)

## 2017-06-24 MED ORDER — IBUPROFEN 800 MG PO TABS: 800.0000 mg | ORAL_TABLET | Freq: Three times a day (TID) | ORAL | 0 refills | Status: DC | PRN

## 2017-06-24 MED ORDER — KETOROLAC TROMETHAMINE 30 MG/ML IJ SOLN
10.0000 mg | Freq: Once | INTRAMUSCULAR | Status: AC
  Administered 2017-06-24: 9.9 mg via INTRAVENOUS
  Filled 2017-06-24: qty 1

## 2017-06-24 MED ORDER — HYDROCODONE-ACETAMINOPHEN 5-325 MG PO TABS: 1.0000 | ORAL_TABLET | Freq: Four times a day (QID) | ORAL | 0 refills | Status: DC | PRN

## 2017-06-24 MED ORDER — IOPAMIDOL (ISOVUE-370) INJECTION 76%
75.0000 mL | Freq: Once | INTRAVENOUS | Status: AC | PRN
  Administered 2017-06-24: 75 mL via INTRAVENOUS

## 2017-06-24 NOTE — ED Provider Notes (Signed)
[facility] Emergency Department Provider Note   ____________________________________________   First MD Initiated Contact with Patient 06/24/17 [phone]     (approximate)  I have reviewed the triage vital signs and the nursing notes.   HISTORY  Chief Complaint Chest Pain    HPI Janet [name] is a 67 y.o. female who presents to the ED from home with a chief complaint of chest pain.  Patient states she was at her daughter's house when she noted central chest tightness approximately 8 PM.  States pain is tender on palpation, with coughing and taking a deep breath.  Also complains of pain in her left leg.  History of cellulitis to that leg status post surgery.  Denies recent fever, chills, shortness of breath, abdominal pain, nausea, vomiting, diarrhea.  Denies recent travel, trauma or hormone use.   Past Medical History:  Diagnosis Date  . Asthma   . Cancer ([place])    kidney  . Hypertension     Patient Active Problem List   Diagnosis Date Noted  . Bilateral cellulitis of lower leg 10/03/2015  . Generalized weakness 09/09/2014  . 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 ABDOMINAL HYSTERECTOMY      Prior to Admission medications   Medication Sig Start Date End Date Taking? Authorizing Provider  albuterol (PROVENTIL HFA;VENTOLIN HFA) 108 (90 BASE) MCG/ACT inhaler Inhale 2 puffs into the lungs every 6 (six) hours as needed for wheezing or shortness of breath. 12/24/14   [name], MD  aspirin EC 81 MG tablet Take 81 mg by mouth daily.    [provider]  HYDROcodone-acetaminophen (NORCO) 5-325 MG tablet Take 1 tablet by mouth every 6 (six) hours as needed for moderate pain. 06/24/17   [name], MD  ibuprofen (ADVIL,MOTRIN) 800 MG tablet Take 1 tablet (800 mg total) by mouth every 8 (eight) hours as needed for moderate pain. 06/24/17   [name], MD  lisinopril (PRINIVIL,ZESTRIL) 40 MG tablet  Take 1 tablet by mouth daily. 08/27/14   [provider]  meclizine (ANTIVERT) 12.5 MG tablet Take 1 tablet (12.5 mg total) by mouth 3 (three) times daily as needed for dizziness or nausea. 04/15/17   Delman Kitten, MD  naproxen (NAPROSYN) 500 MG tablet Take 1 tablet (500 mg total) by mouth 2 (two) times daily with a meal. 05/15/16   Lorin Picket, PA-C  ondansetron (ZOFRAN ODT) 4 MG disintegrating tablet Take 1 tablet (4 mg total) by mouth every 6 (six) hours as needed for nausea or vomiting. 04/15/17   Delman Kitten, MD  potassium chloride SA (K-DUR,KLOR-CON) 20 MEQ tablet Take 20 mEq by mouth daily.     [provider]  triamcinolone cream (KENALOG) 0.1 % Apply 1 application topically 2 (two) times daily. As needed x 10 days max. 12/11/16   [name], MD    Allergies Shellfish allergy and Penicillins  Family History  Problem Relation Age of Onset  . Stroke Neg Hx     Social History Social History   Tobacco Use  . Smoking status: Never Smoker  . Smokeless tobacco: Never Used  Substance Use Topics  . Alcohol use: No  . Drug use: No    Review of Systems  Constitutional: No fever/chills Eyes: No visual changes. ENT: No sore throat. Cardiovascular: Positive for chest pain. Respiratory: Denies shortness of breath. Gastrointestinal: No abdominal pain.  No nausea, no vomiting.  No diarrhea.  No  constipation. Genitourinary: Negative for dysuria. Musculoskeletal: Positive for left leg pain and swelling.  Negative for back pain. Skin: Negative for rash. Neurological: Negative for headaches, focal weakness or numbness.   ____________________________________________   PHYSICAL EXAM:  VITAL SIGNS: ED Triage Vitals  Enc Vitals Group     BP 06/23/17 2254 (!) 144/63     Pulse Rate 06/23/17 2254 75     Resp 06/23/17 2254 18     Temp 06/23/17 2254 98.2 F (36.8 C)     Temp Source 06/23/17 2254 Oral     SpO2 06/23/17 2254 99 %     Weight 06/23/17 2253 216 lb  (98 kg)     Height 06/23/17 2253 5\' 9"  (1.753 m)     Head Circumference --      Peak Flow --      Pain Score 06/23/17 2253 7     Pain Loc --      Pain Edu? --      Excl. in [place]? --     Constitutional: Asleep, awakened for exam.  Alert and oriented. Well appearing and in no acute distress. Eyes: Conjunctivae are normal. PERRL. EOMI. Head: Atraumatic. Nose: No congestion/rhinnorhea. Mouth/Throat: Mucous membranes are moist.  Oropharynx non-erythematous. Neck: No stridor.   Cardiovascular: Normal rate, regular rhythm. Grossly normal heart sounds.  Good peripheral circulation. Respiratory: Normal respiratory effort.  No retractions. Lungs CTAB.  Anterior chest tender to palpation and with movement of trunk. Gastrointestinal: Soft and nontender. No distention. No abdominal bruits. No CVA tenderness. Musculoskeletal: No lower extremity tenderness. 1+ BLE nonpitting edema.  LLE to palpation.  No joint effusions. Neurologic:  Normal speech and language. No gross focal neurologic deficits are appreciated. No gait instability. Skin:  Skin is warm, dry and intact. No rash noted. Psychiatric: Mood and affect are normal. Speech and behavior are normal.  ____________________________________________   LABS (all labs ordered are listed, but only abnormal results are displayed)  Labs Reviewed  BASIC METABOLIC PANEL - Abnormal; Notable for the following components:      Result Value   Glucose, Bld 112 (*)    All other components within normal limits  FIBRIN DERIVATIVES D-DIMER ([facility] ONLY) - Abnormal; Notable for the following components:   Fibrin derivatives D-dimer [facility]) 3,323.99 (*)    All other components within normal limits  CBC  TROPONIN I  TROPONIN I   ____________________________________________  EKG  ED ECG REPORT I, James Lafalce J, the attending physician, personally viewed and interpreted this ECG.   Date: 06/24/2017  EKG Time: 2251  Rate: 72  Rhythm: normal EKG, normal sinus  rhythm  Axis: Normal  Intervals:none  ST&T Change: Nonspecific  ____________________________________________  RADIOLOGY  ED MD interpretation: No acute cardiopulmonary process  Official radiology report(s): Dg Chest 2 View  Result Date: 06/23/2017 CLINICAL DATA:  Chest tightness EXAM: CHEST - 2 VIEW COMPARISON:  04/15/2017 CXR and 04/23/2015 chest CT FINDINGS: The heart size and mediastinal contours are within normal limits. Mild uncoiling of the thoracic aorta with minimal atherosclerosis. Both lungs are clear. The visualized skeletal structures are unremarkable. IMPRESSION: No active cardiopulmonary disease. Electronically Signed   By: Ashley Royalty M.D.   On: 06/23/2017 23:47   Ct Angio Chest Pe W/cm &/or Wo Cm  Result Date: 06/24/2017 CLINICAL DATA:  Chest pain and elevated D-dimer.  Left leg pain. EXAM: CT ANGIOGRAPHY CHEST WITH CONTRAST TECHNIQUE: Multidetector CT imaging of the chest was performed using the standard protocol during bolus administration of intravenous contrast. Multiplanar  CT image reconstructions and MIPs were obtained to evaluate the vascular anatomy. CONTRAST:  62mL ISOVUE-370 IOPAMIDOL (ISOVUE-370) INJECTION 76% COMPARISON:  Chest radiograph yesterday.  Chest CT 04/23/2015 FINDINGS: Cardiovascular: There are no filling defects within the pulmonary arteries to suggest pulmonary embolus. Thoracic aorta is normal caliber without dissection. Mild aortic atherosclerosis. Heart is normal in size. No pericardial effusion. Mediastinum/Nodes: No enlarged mediastinal or hilar lymph nodes. Esophagus is nondistended. Left thyroid nodule on prior exams is obscured by streak artifact from contrast in the adjacent vessels. Lungs/Pleura: Atelectasis in dependent lower lobes. No confluent consolidation. No pulmonary edema or pleural fluid. No pulmonary mass. Trachea and mainstem bronchi are patent. Upper Abdomen: Right renal cyst, partially included. Exophytic cyst from the upper left kidney  partially included. Musculoskeletal: There are no acute or suspicious osseous abnormalities. Review of the MIP images confirms the above findings. IMPRESSION: 1. No pulmonary embolus. 2. Bilateral lower lobe atelectasis. 3.  Aortic Atherosclerosis (ICD10-I70.0). Electronically Signed   By: Jeb Levering M.D.   On: 06/24/2017 06:31   US Venous Img Lower Unilateral Left  Result Date: 06/24/2017 CLINICAL DATA:  Pain and swelling of the left lower extremity EXAM: LEFT LOWER EXTREMITY VENOUS DOPPLER ULTRASOUND TECHNIQUE: Gray-scale sonography with graded compression, as well as color Doppler and duplex ultrasound were performed to evaluate the lower extremity deep venous systems from the level of the common femoral vein and including the common femoral, femoral, profunda femoral, popliteal and calf veins including the posterior tibial, peroneal and gastrocnemius veins when visible. The superficial great saphenous vein was also interrogated. Spectral Doppler was utilized to evaluate flow at rest and with distal augmentation maneuvers in the common femoral, femoral and popliteal veins. COMPARISON:  None. FINDINGS: Contralateral Common Femoral Vein: Respiratory phasicity is normal and symmetric with the symptomatic side. No evidence of thrombus. Normal compressibility. Common Femoral Vein: No evidence of thrombus. Normal compressibility, respiratory phasicity and response to augmentation. Saphenofemoral Junction: No evidence of thrombus. Normal compressibility and flow on color Doppler imaging. Profunda Femoral Vein: No evidence of thrombus. Normal compressibility and flow on color Doppler imaging. Femoral Vein: No evidence of thrombus. Normal compressibility, respiratory phasicity and response to augmentation. Popliteal Vein: No evidence of thrombus. Normal compressibility, respiratory phasicity and response to augmentation. Calf Veins: No evidence of thrombus. Normal compressibility and flow on color Doppler  imaging. Superficial Great Saphenous Vein: No evidence of thrombus. Normal compressibility. Venous Reflux:  None. Other Findings:  None. IMPRESSION: No evidence of deep venous thrombosis. Electronically Signed   By: Ashley Royalty M.D.   On: 06/24/2017 03:45    ____________________________________________   PROCEDURES  Procedure(s) performed: None  Procedures  Critical Care performed: No  ____________________________________________   INITIAL IMPRESSION / ASSESSMENT AND PLAN / ED COURSE  As part of my medical decision making, I reviewed the following data within the electronic MEDICAL RECORD NUMBER History obtained from family, Nursing notes reviewed and incorporated, Labs reviewed, EKG interpreted, Old chart reviewed, Radiograph reviewed and Notes from prior ED visits   67 year old female with a history of hypertension and cellulitis who presents with chest pain and left lower leg pain/swelling. Differential diagnosis includes, but is not limited to, ACS, aortic dissection, pulmonary embolism, cardiac tamponade, pneumothorax, pneumonia, pericarditis, myocarditis, GI-related causes including esophagitis/gastritis, and musculoskeletal chest wall pain.     Initial EKG and troponin are unremarkable.  Will repeat timed troponin, check d-dimer, obtain Doppler ultrasound of left lower leg to evaluate for DVT.  Will administer 10 mg IV Toradol  for reproducible chest discomfort.   Clinical Course as of Jun 24 713  Mon Jun 24, 2017  0453 Elevated d-dimer noted.  Will obtain CT chest.  Ultrasound negative for DVT.   [JS]  [id number] Updated patient and spouse of negative CT result.  Repeat troponin remains normal.  Will discharge home on NSAIDs, analgesia and patient will follow-up closely with her PCP.  Strict return precautions given.  Both verbalize understanding and agree with plan of care.   [JS]    Clinical Course User Index [JS] [name], MD      ____________________________________________   FINAL CLINICAL IMPRESSION(S) / ED DIAGNOSES  Final diagnoses:  Chest pain, unspecified type  Chest wall pain     ED Discharge Orders        Ordered    ibuprofen (ADVIL,MOTRIN) 800 MG tablet  Every 8 hours PRN     06/24/17 0637    HYDROcodone-acetaminophen (NORCO) 5-325 MG tablet  Every 6 hours PRN     06/24/17 2633       Note:  This document was prepared using Dragon voice recognition software and may include unintentional dictation errors.    [name], MD 06/24/17 [phone]

## 2017-06-24 NOTE — ED Notes (Signed)
ED Provider at bedside. 

## 2017-06-24 NOTE — ED Notes (Signed)
Patient transported to Ultrasound 

## 2017-06-24 NOTE — ED Notes (Signed)
Patient transported to CT 

## 2017-06-24 NOTE — ED Notes (Signed)
Patient with complaint of increase in chest pain. Dr. Beather Arbour notified.

## 2017-06-24 NOTE — Discharge Instructions (Addendum)
1.  You may take pain medicines as needed (Motrin/Norco #15). 2.  Apply moist heat to affected area several times daily. 3.  Return to the ER for worsening symptoms, persistent vomiting, difficulty breathing or other concerns. 

## 2017-08-08 ENCOUNTER — Other Ambulatory Visit: Payer: Self-pay | Admitting: Family Medicine

## 2017-08-08 DIAGNOSIS — R519 Headache, unspecified: Secondary | ICD-10-CM

## 2017-08-08 DIAGNOSIS — R51 Headache: Principal | ICD-10-CM

## 2017-08-15 ENCOUNTER — Ambulatory Visit
Admission: RE | Admit: 2017-08-15 | Discharge: 2017-08-15 | Disposition: A | Discharge status: Home / Self Care | Payer: Medicare Other | Source: Ambulatory Visit | Attending: Family Medicine | Admitting: Family Medicine

## 2017-08-15 DIAGNOSIS — R519 Headache, unspecified: Secondary | ICD-10-CM

## 2017-08-15 DIAGNOSIS — R51 Headache: Secondary | ICD-10-CM | POA: Diagnosis not present

## 2017-08-29 DIAGNOSIS — R519 Headache, unspecified: Secondary | ICD-10-CM | POA: Insufficient documentation

## 2017-09-06 ENCOUNTER — Other Ambulatory Visit: Payer: Self-pay | Admitting: Family Medicine

## 2017-09-06 DIAGNOSIS — Z1231 Encounter for screening mammogram for malignant neoplasm of breast: Secondary | ICD-10-CM

## 2017-09-19 ENCOUNTER — Other Ambulatory Visit: Payer: Self-pay

## 2017-09-19 ENCOUNTER — Encounter: Payer: Self-pay | Admitting: Emergency Medicine

## 2017-09-19 ENCOUNTER — Emergency Department: Payer: Medicare Other

## 2017-09-19 ENCOUNTER — Emergency Department
Admission: EM | Admit: 2017-09-19 | Discharge: 2017-09-19 | Disposition: A | Discharge status: Home / Self Care | Payer: Medicare Other | Attending: Emergency Medicine | Admitting: Emergency Medicine

## 2017-09-19 DIAGNOSIS — Z85528 Personal history of other malignant neoplasm of kidney: Secondary | ICD-10-CM | POA: Diagnosis not present

## 2017-09-19 DIAGNOSIS — Z7982 Long term (current) use of aspirin: Secondary | ICD-10-CM | POA: Diagnosis not present

## 2017-09-19 DIAGNOSIS — I10 Essential (primary) hypertension: Secondary | ICD-10-CM | POA: Diagnosis not present

## 2017-09-19 DIAGNOSIS — M79605 Pain in left leg: Secondary | ICD-10-CM

## 2017-09-19 DIAGNOSIS — Z79899 Other long term (current) drug therapy: Secondary | ICD-10-CM | POA: Diagnosis not present

## 2017-09-19 DIAGNOSIS — J45909 Unspecified asthma, uncomplicated: Secondary | ICD-10-CM | POA: Insufficient documentation

## 2017-09-19 MED ORDER — TRAMADOL HCL 50 MG PO TABS: 50.0000 mg | ORAL_TABLET | Freq: Four times a day (QID) | ORAL | 0 refills | Status: DC | PRN

## 2017-09-19 NOTE — ED Notes (Addendum)
First Nurse Note: Patient here from [facility] via [facility] with complaint of left leg pain X 3 days.  Brought to ED for "work-up for DVT".

## 2017-09-19 NOTE — ED Provider Notes (Signed)
[name] Emergency Department Provider Note  ____________________________________________   First MD Initiated Contact with Patient 09/19/17 [phone]     (approximate)  I have reviewed the triage vital signs and the nursing notes.   HISTORY  Chief Complaint Leg Pain  HPI Janet [name] is a 67 y.o. female resents to the ED with complaint of left leg pain x4 days.  Patient states that pain increases with weightbearing.  She denies any injury to her leg.  Patient initially was seen at [name] and sent to the emergency department for further evaluation.  Patient has not taken any over-the-counter medication prior to arrival but drove herself to the emergency department and has no driver.  She rates her pain as 7 out of 10.  She denies any chest pain, shortness of breath or recent travel.  Past Medical History:  Diagnosis Date  . Asthma   . Cancer ([place])    kidney  . Hypertension     Patient Active Problem List   Diagnosis Date Noted  . Bilateral cellulitis of lower leg 10/03/2015  . Generalized weakness 09/09/2014  . 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 ABDOMINAL HYSTERECTOMY      Prior to Admission medications   Medication Sig Start Date End Date Taking? Authorizing Provider  albuterol (PROVENTIL HFA;VENTOLIN HFA) 108 (90 BASE) MCG/ACT inhaler Inhale 2 puffs into the lungs every 6 (six) hours as needed for wheezing or shortness of breath. 12/24/14   [name], MD  aspirin EC 81 MG tablet Take 81 mg by mouth daily.    [provider]  ibuprofen (ADVIL,MOTRIN) 800 MG tablet Take 1 tablet (800 mg total) by mouth every 8 (eight) hours as needed for moderate pain. 06/24/17   [name], MD  lisinopril (PRINIVIL,ZESTRIL) 40 MG tablet Take 1 tablet by mouth daily. 08/27/14   [provider]  meclizine (ANTIVERT) 12.5 MG tablet Take 1 tablet (12.5 mg total) by mouth 3 (three)  times daily as needed for dizziness or nausea. 04/15/17   Delman Kitten, MD  ondansetron (ZOFRAN ODT) 4 MG disintegrating tablet Take 1 tablet (4 mg total) by mouth every 6 (six) hours as needed for nausea or vomiting. 04/15/17   Delman Kitten, MD  potassium chloride SA (K-DUR,KLOR-CON) 20 MEQ tablet Take 20 mEq by mouth daily.     [provider]  traMADol (ULTRAM) 50 MG tablet Take 1 tablet (50 mg total) by mouth every 6 (six) hours as needed. 09/19/17   [name], PA-C  triamcinolone cream (KENALOG) 0.1 % Apply 1 application topically 2 (two) times daily. As needed x 10 days max. 12/11/16   [name], MD    Allergies Shellfish allergy and Penicillins  Family History  Problem Relation Age of Onset  . Stroke Neg Hx     Social History Social History   Tobacco Use  . Smoking status: Never Smoker  . Smokeless tobacco: Never Used  Substance Use Topics  . Alcohol use: No  . Drug use: No    Review of Systems Constitutional: No fever/chills Cardiovascular: Denies chest pain. Respiratory: Denies shortness of breath. Gastrointestinal: No abdominal pain.  No nausea, no vomiting.   Musculoskeletal: Positive for left leg pain. Skin: Negative for rash. Neurological: Negative for headaches, focal weakness or numbness. ___________________________________________   PHYSICAL EXAM:  VITAL SIGNS: ED Triage Vitals  Enc Vitals Group     BP 09/19/17 0859 (!) 142/66  Pulse Rate 09/19/17 0859 73     Resp 09/19/17 0859 16     Temp 09/19/17 0859 98.3 F (36.8 C)     Temp Source 09/19/17 0859 Oral     SpO2 09/19/17 0859 98 %     Weight 09/19/17 0900 214 lb (97.1 kg)     Height 09/19/17 0900 5\' 9"  (1.753 m)     Head Circumference --      Peak Flow --      Pain Score 09/19/17 0900 7     Pain Loc --      Pain Edu? --      Excl. in [place]? --    Constitutional: Alert and oriented. Well appearing and in no acute distress. Eyes: Conjunctivae are normal.  Head:  Atraumatic. Neck: No stridor.   Cardiovascular: Normal rate, regular rhythm. Grossly normal heart sounds.  Good peripheral circulation. Respiratory: Normal respiratory effort.  No retractions. Lungs CTAB. Musculoskeletal: Examination of left lower extremity there is no gross deformity however there is peripheral vascular disease changes that are observed.  There is generalized tenderness on palpation but no point tenderness or erythema present to the lower extremity.  There is a questionable Homans sign present.  Skin is intact.  No evidence of abrasion or ecchymosis.  Pulses present bilaterally.  No restriction noted with range of motion of the left knee.  Patient is able to abduct and adduct.  There is some generalized tenderness to palpation of the left hip.  No deformity noted. Neurologic:  Normal speech and language. No gross focal neurologic deficits are appreciated. No gait instability. Skin:  Skin is warm, dry and intact. No rash noted. Psychiatric: Mood and affect are normal. Speech and behavior are normal.  ____________________________________________   LABS (all labs ordered are listed, but only abnormal results are displayed)  Labs Reviewed - No data to display  RADIOLOGY   Official radiology report(s): US Venous Img Lower Unilateral Left  Result Date: 09/19/2017 CLINICAL DATA:  67 year old with left lower extremity pain and swelling. EXAM: LEFT LOWER EXTREMITY VENOUS DOPPLER ULTRASOUND TECHNIQUE: Gray-scale sonography with graded compression, as well as color Doppler and duplex ultrasound were performed to evaluate the lower extremity deep venous systems from the level of the common femoral vein and including the common femoral, femoral, profunda femoral, popliteal and calf veins including the posterior tibial, peroneal and gastrocnemius veins when visible. The superficial great saphenous vein was also interrogated. Spectral Doppler was utilized to evaluate flow at rest and with  distal augmentation maneuvers in the common femoral, femoral and popliteal veins. COMPARISON:  06/24/2017 FINDINGS: Contralateral Common Femoral Vein: Respiratory phasicity is normal and symmetric with the symptomatic side. No evidence of thrombus. Normal compressibility. Common Femoral Vein: No evidence of thrombus. Normal compressibility, respiratory phasicity and response to augmentation. Saphenofemoral Junction: No evidence of thrombus. Normal compressibility and flow on color Doppler imaging. Profunda Femoral Vein: No evidence of thrombus. Normal compressibility and flow on color Doppler imaging. Femoral Vein: No evidence of thrombus. Normal compressibility, respiratory phasicity and response to augmentation. Popliteal Vein: No evidence of thrombus. Normal compressibility, respiratory phasicity and response to augmentation. Calf Veins: No evidence of thrombus. Normal compressibility and flow on color Doppler imaging. Venous Reflux:  None. Other Findings:  None. IMPRESSION: Negative for deep venous thrombosis in left lower extremity. Electronically Signed   By: Markus Daft M.D.   On: 09/19/2017 10:26  ____________________________________________   PROCEDURES  Procedure(s) performed: None  Procedures  Critical Care performed: No  ____________________________________________   INITIAL IMPRESSION / ASSESSMENT AND PLAN / ED COURSE  As part of my medical decision making, I reviewed the following data within the electronic MEDICAL RECORD NUMBER Notes from prior ED visits and [place] Controlled Substance Database  Patient presents to the emergency department with complaint of left leg pain for 4 days.  There is been no history of injury.  She was seen earlier at [facility] who sent her to the emergency department for work-up to rule out DVT.  Ultrasound was reassuring and patient was made aware that there is no DVT.  Patient was placed on tramadol 1 every 6 hours as needed for pain.  She is to follow-up  with her PCP if any continued problems.  We also discussed continuing taking Aleve 1 tablet twice a day for inflammation. ____________________________________________   FINAL CLINICAL IMPRESSION(S) / ED DIAGNOSES  Final diagnoses:  Left leg pain     ED Discharge Orders         Ordered    traMADol (ULTRAM) 50 MG tablet  Every 6 hours PRN     09/19/17 1100           Note:  This document was prepared using Dragon voice recognition software and may include unintentional dictation errors.    [name], PA-C 09/19/17 1549    Lisa Roca, MD 09/21/17 [phone]

## 2017-09-19 NOTE — ED Triage Notes (Signed)
Left leg pain since Sunday.  Increases with wt bearing.  From hip down

## 2017-09-19 NOTE — Discharge Instructions (Addendum)
Call make an appointment with your primary care provider at [facility] clinic for follow-up.  Begin taking tramadol 50 mg 1 every 6 hours as needed for pain.  You may also take Aleve 1 tablet twice a day for inflammation. Today's imaging is negative for a blood clot.

## 2017-10-07 ENCOUNTER — Other Ambulatory Visit: Payer: Self-pay | Admitting: Family Medicine

## 2017-10-07 ENCOUNTER — Ambulatory Visit
Admission: RE | Admit: 2017-10-07 | Discharge: 2017-10-07 | Disposition: A | Discharge status: Home / Self Care | Payer: Medicare Other | Source: Ambulatory Visit | Attending: Family Medicine | Admitting: Family Medicine

## 2017-10-07 DIAGNOSIS — M79605 Pain in left leg: Secondary | ICD-10-CM | POA: Insufficient documentation

## 2017-10-07 DIAGNOSIS — M25552 Pain in left hip: Secondary | ICD-10-CM

## 2017-10-14 ENCOUNTER — Ambulatory Visit
Admission: RE | Admit: 2017-10-14 | Discharge: 2017-10-14 | Disposition: A | Discharge status: Home / Self Care | Payer: Medicare Other | Source: Ambulatory Visit | Attending: Family Medicine | Admitting: Family Medicine

## 2017-10-14 DIAGNOSIS — Z1231 Encounter for screening mammogram for malignant neoplasm of breast: Secondary | ICD-10-CM | POA: Diagnosis not present

## 2017-11-01 DIAGNOSIS — M25552 Pain in left hip: Secondary | ICD-10-CM | POA: Insufficient documentation

## 2017-11-21 ENCOUNTER — Other Ambulatory Visit: Payer: Self-pay

## 2017-11-21 ENCOUNTER — Ambulatory Visit
Admission: EM | Admit: 2017-11-21 | Discharge: 2017-11-21 | Disposition: A | Discharge status: Home / Self Care | Payer: Medicare Other | Attending: Family Medicine | Admitting: Family Medicine

## 2017-11-21 ENCOUNTER — Encounter: Payer: Self-pay | Admitting: Emergency Medicine

## 2017-11-21 DIAGNOSIS — L03115 Cellulitis of right lower limb: Secondary | ICD-10-CM

## 2017-11-21 DIAGNOSIS — I872 Venous insufficiency (chronic) (peripheral): Secondary | ICD-10-CM

## 2017-11-21 MED ORDER — DOXYCYCLINE HYCLATE 100 MG PO CAPS: 100.0000 mg | ORAL_CAPSULE | Freq: Two times a day (BID) | ORAL | 0 refills | Status: DC

## 2017-11-21 MED ORDER — NAPROXEN 500 MG PO TABS: 500.0000 mg | ORAL_TABLET | Freq: Two times a day (BID) | ORAL | 0 refills | Status: DC | PRN

## 2017-11-21 NOTE — ED Triage Notes (Signed)
Patient c/o bilateral leg pain that started 3 days ago. Patient states her right lower leg is swollen and red. She also states her left lower leg is swollen but not red. Patient states she has had this several times before and has been sent to the ER for ultrasound.

## 2017-11-21 NOTE — ED Provider Notes (Signed)
[facility]    CSN: [id number] Arrival date & time: 11/21/17  1921  History   Chief Complaint Chief Complaint  Patient presents with  . Leg Pain   HPI  67 year old female with chronic venous insufficiency presents with leg pain and now redness.  Patient has long-standing history of venous insufficiency.  She is had surgery prior.  She has been seen by vascular surgery recently.  She reports a 3 to 4-day history of worsening pain in the lower extremities.  Most notably, her right lower extremity has been red.  Both the wound to the touch.  No fever.  No chills.  No medications or interventions tried.  No recent long travel.  No falls, trauma, injury.  No other associated symptoms.  No other complaints.  Past Medical History:  Diagnosis Date  . Asthma   . Cancer ([place])    kidney  . Hypertension    Patient Active Problem List   Diagnosis Date Noted  . Bilateral cellulitis of lower leg 10/03/2015  . Generalized weakness 09/09/2014  . 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   Past Surgical History:  Procedure Laterality Date  . ABDOMINAL HYSTERECTOMY     OB History    Gravida  3   Para      Term      Preterm      AB      Living  2     SAB      TAB      Ectopic      Multiple      Live Births               Home Medications    Prior to Admission medications   Medication Sig Start Date End Date Taking? Authorizing Provider  albuterol (PROVENTIL HFA;VENTOLIN HFA) 108 (90 BASE) MCG/ACT inhaler Inhale 2 puffs into the lungs every 6 (six) hours as needed for wheezing or shortness of breath. 12/24/14  Yes [name], MD  aspirin EC 81 MG tablet Take 81 mg by mouth daily.   Yes [provider]  lisinopril (PRINIVIL,ZESTRIL) 40 MG tablet Take 1 tablet by mouth daily. 08/27/14  Yes [provider]  meclizine (ANTIVERT) 12.5 MG tablet Take 1 tablet (12.5 mg total) by mouth 3 (three) times daily as needed for dizziness or nausea. 04/15/17   Yes Delman Kitten, MD  ondansetron (ZOFRAN ODT) 4 MG disintegrating tablet Take 1 tablet (4 mg total) by mouth every 6 (six) hours as needed for nausea or vomiting. 04/15/17  Yes Delman Kitten, MD  potassium chloride SA (K-DUR,KLOR-CON) 20 MEQ tablet Take 20 mEq by mouth daily.    Yes [provider]  traMADol (ULTRAM) 50 MG tablet Take 1 tablet (50 mg total) by mouth every 6 (six) hours as needed. 09/19/17  Yes Letitia Neri L, PA-C  triamcinolone cream (KENALOG) 0.1 % Apply 1 application topically 2 (two) times daily. As needed x 10 days max. 12/11/16  Yes [name], MD  doxycycline (VIBRAMYCIN) 100 MG capsule Take 1 capsule (100 mg total) by mouth 2 (two) times daily. 11/21/17   Coral Spikes, DO  naproxen (NAPROSYN) 500 MG tablet Take 1 tablet (500 mg total) by mouth 2 (two) times daily as needed. 11/21/17   Coral Spikes, DO    Family History Family History  Problem Relation Age of Onset  . Stroke Neg Hx     Social History Social History   Tobacco Use  . Smoking status: Never Smoker  .  Smokeless tobacco: Never Used  Substance Use Topics  . Alcohol use: No  . Drug use: No     Allergies   Shellfish allergy and Penicillins   Review of Systems Review of Systems  Constitutional: Negative.   Skin:       Lower extremity swelling, redness, pain.   Physical Exam Triage Vital Signs ED Triage Vitals [11/21/17 1930]  Enc Vitals Group     BP (!) 158/81     Pulse Rate 85     Resp 18     Temp 98.9 F (37.2 C)     Temp Source Oral     SpO2 95 %     Weight 216 lb (98 kg)     Height 5\' 9"  (1.753 m)     Head Circumference      Peak Flow      Pain Score 8     Pain Loc      Pain Edu?      Excl. in [place]?    Updated Vital Signs BP (!) 158/81 (BP Location: Left Arm)   Pulse 85   Temp 98.9 F (37.2 C) (Oral)   Resp 18   Ht 5\' 9"  (1.753 m)   Wt 98 kg   SpO2 95%   BMI 31.90 kg/m   Visual Acuity Right Eye Distance:   Left Eye Distance:   Bilateral Distance:     Right Eye Near:   Left Eye Near:    Bilateral Near:     Physical Exam  Constitutional: She is oriented to person, place, and time. She appears well-developed. No distress.  HENT:  Head: Normocephalic and atraumatic.  Eyes: Conjunctivae are normal. Right eye exhibits no discharge. Left eye exhibits no discharge.  Pulmonary/Chest: Effort normal. No respiratory distress.  Neurological: She is alert and oriented to person, place, and time.  Skin:  Bilateral lower extremities with hyperpigmentation consistent with venous insufficiency.  Bilateral lower extremity edema.  Right lower extremity with mild redness and warmth.  Psychiatric: She has a normal mood and affect. Her behavior is normal.  Nursing note and vitals reviewed.  UC Treatments / Results  Labs (all labs ordered are listed, but only abnormal results are displayed) Labs Reviewed - No data to display  EKG None  Radiology No results found.  Procedures Procedures (including critical care time)  Medications Ordered in UC Medications - No data to display  Initial Impression / Assessment and Plan / UC Course  I have reviewed the triage vital signs and the nursing notes.  Pertinent labs & imaging results that were available during my care of the patient were reviewed by me and considered in my medical decision making (see chart for details).    67 year old female presents with chronic venous insufficiency.  Possible overlying cellulitis.  Hard to determine given I have never seen her previously.  Area is erythematous.  Placing on doxycycline.  Naproxen for pain.  Supportive care.  Final Clinical Impressions(s) / UC Diagnoses   Final diagnoses:  Chronic venous insufficiency  Cellulitis of right lower extremity     Discharge Instructions     Medications as prescribed.  Elevate. Compression stockings.  Take care  Dr. Lacinda Axon    ED Prescriptions    Medication Sig Dispense Auth. Provider   doxycycline  (VIBRAMYCIN) 100 MG capsule Take 1 capsule (100 mg total) by mouth 2 (two) times daily. 14 capsule Thetis Schwimmer G, DO   naproxen (NAPROSYN) 500 MG tablet Take 1 tablet (500  mg total) by mouth 2 (two) times daily as needed. 30 tablet Coral Spikes, DO     Controlled Substance Prescriptions [place] Controlled Substance Registry consulted? Not Applicable   Coral Spikes, DO 11/21/17 2022

## 2017-11-21 NOTE — Discharge Instructions (Signed)
Medications as prescribed.  Elevate. Compression stockings.  Take care  Dr. Lacinda Axon

## 2018-02-12 ENCOUNTER — Other Ambulatory Visit: Payer: Self-pay | Admitting: Family Medicine

## 2018-02-12 DIAGNOSIS — Z13820 Encounter for screening for osteoporosis: Secondary | ICD-10-CM

## 2018-02-19 ENCOUNTER — Other Ambulatory Visit: Payer: Self-pay

## 2018-02-19 DIAGNOSIS — J45909 Unspecified asthma, uncomplicated: Secondary | ICD-10-CM | POA: Insufficient documentation

## 2018-02-19 DIAGNOSIS — Z85528 Personal history of other malignant neoplasm of kidney: Secondary | ICD-10-CM | POA: Diagnosis not present

## 2018-02-19 DIAGNOSIS — I10 Essential (primary) hypertension: Secondary | ICD-10-CM | POA: Insufficient documentation

## 2018-02-19 DIAGNOSIS — Z7982 Long term (current) use of aspirin: Secondary | ICD-10-CM | POA: Insufficient documentation

## 2018-02-19 DIAGNOSIS — R42 Dizziness and giddiness: Secondary | ICD-10-CM | POA: Insufficient documentation

## 2018-02-19 DIAGNOSIS — Z79899 Other long term (current) drug therapy: Secondary | ICD-10-CM | POA: Diagnosis not present

## 2018-02-19 LAB — COMPREHENSIVE METABOLIC PANEL
ALT: 12 U/L (ref 0–44)
ANION GAP: 7 (ref 5–15)
AST: 18 U/L (ref 15–41)
Albumin: 4.1 g/dL (ref 3.5–5.0)
Alkaline Phosphatase: 47 U/L (ref 38–126)
BUN: 15 mg/dL (ref 8–23)
CHLORIDE: 109 mmol/L (ref 98–111)
CO2: 22 mmol/L (ref 22–32)
Calcium: 9.5 mg/dL (ref 8.9–10.3)
Creatinine, Ser: 0.63 mg/dL (ref 0.44–1.00)
Glucose, Bld: 110 mg/dL — ABNORMAL HIGH (ref 70–99)
Potassium: 3.8 mmol/L (ref 3.5–5.1)
Sodium: 138 mmol/L (ref 135–145)
Total Bilirubin: 0.6 mg/dL (ref 0.3–1.2)
Total Protein: 8.2 g/dL — ABNORMAL HIGH (ref 6.5–8.1)

## 2018-02-19 LAB — URINALYSIS, COMPLETE (UACMP) WITH MICROSCOPIC
BILIRUBIN URINE: NEGATIVE
Bacteria, UA: NONE SEEN
GLUCOSE, UA: NEGATIVE mg/dL
Hgb urine dipstick: NEGATIVE
KETONES UR: NEGATIVE mg/dL
Leukocytes, UA: NEGATIVE
Nitrite: NEGATIVE
PH: 6 (ref 5.0–8.0)
Protein, ur: NEGATIVE mg/dL
Specific Gravity, Urine: 1.012 (ref 1.005–1.030)

## 2018-02-19 LAB — CBC
HCT: 41.6 % (ref 36.0–46.0)
Hemoglobin: 13.1 g/dL (ref 12.0–15.0)
MCH: 27.9 pg (ref 26.0–34.0)
MCHC: 31.5 g/dL (ref 30.0–36.0)
MCV: 88.5 fL (ref 80.0–100.0)
PLATELETS: 335 10*3/uL (ref 150–400)
RBC: 4.7 MIL/uL (ref 3.87–5.11)
RDW: 13.2 % (ref 11.5–15.5)
WBC: 6.9 10*3/uL (ref 4.0–10.5)
nRBC: 0 % (ref 0.0–0.2)

## 2018-02-19 LAB — TROPONIN I

## 2018-02-19 NOTE — ED Triage Notes (Signed)
Pt states yesterday she started having dizziness and nausea. States feels generalized weakness, no pain.

## 2018-02-20 ENCOUNTER — Emergency Department: Payer: Medicare PPO

## 2018-02-20 ENCOUNTER — Emergency Department
Admission: EM | Admit: 2018-02-20 | Discharge: 2018-02-20 | Disposition: A | Discharge status: Home / Self Care | Payer: Medicare PPO | Attending: Emergency Medicine | Admitting: Emergency Medicine

## 2018-02-20 DIAGNOSIS — R42 Dizziness and giddiness: Secondary | ICD-10-CM | POA: Diagnosis not present

## 2018-02-20 MED ORDER — MECLIZINE HCL 12.5 MG PO TABS: 12.5000 mg | ORAL_TABLET | Freq: Three times a day (TID) | ORAL | 0 refills | Status: DC | PRN

## 2018-02-20 MED ORDER — MECLIZINE HCL 25 MG PO TABS
25.0000 mg | ORAL_TABLET | Freq: Once | ORAL | Status: AC
  Administered 2018-02-20: 25 mg via ORAL
  Filled 2018-02-20: qty 1

## 2018-02-20 NOTE — ED Provider Notes (Signed)
[facility] Emergency Department Provider Note _______   First MD Initiated Contact with Patient 02/20/18 [phone]     (approximate)  I have reviewed the triage vital signs and the nursing notes.   HISTORY  Chief Complaint Dizziness   HPI Janet [name] is a 68 y.o. female with below list of chronic medical conditions presents to the emergency department with dizziness since yesterday with accompanying nausea.  Patient denies any headache no weakness numbness gait instability or vomiting.  She denies any visual loss.  Patient does admit to recent "cold and congestion".   Past Medical History:  Diagnosis Date  . Asthma   . Cancer ([place])    kidney  . Hypertension     Patient Active Problem List   Diagnosis Date Noted  . Bilateral cellulitis of lower leg 10/03/2015  . Generalized weakness 09/09/2014  . 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 ABDOMINAL HYSTERECTOMY      Prior to Admission medications   Medication Sig Start Date End Date Taking? Authorizing Provider  albuterol (PROVENTIL HFA;VENTOLIN HFA) 108 (90 BASE) MCG/ACT inhaler Inhale 2 puffs into the lungs every 6 (six) hours as needed for wheezing or shortness of breath. 12/24/14   [name], MD  aspirin EC 81 MG tablet Take 81 mg by mouth daily.    [provider]  doxycycline (VIBRAMYCIN) 100 MG capsule Take 1 capsule (100 mg total) by mouth 2 (two) times daily. 11/21/17   Coral Spikes, DO  lisinopril (PRINIVIL,ZESTRIL) 40 MG tablet Take 1 tablet by mouth daily. 08/27/14   [provider]  meclizine (ANTIVERT) 12.5 MG tablet Take 1 tablet (12.5 mg total) by mouth 3 (three) times daily as needed for dizziness or nausea. 04/15/17   Delman Kitten, MD  meclizine (ANTIVERT) 12.5 MG tablet Take 1 tablet (12.5 mg total) by mouth 3 (three) times daily as needed for dizziness. 02/20/18   Gregor Hams, MD  naproxen (NAPROSYN) 500 MG  tablet Take 1 tablet (500 mg total) by mouth 2 (two) times daily as needed. 11/21/17   Coral Spikes, DO  ondansetron (ZOFRAN ODT) 4 MG disintegrating tablet Take 1 tablet (4 mg total) by mouth every 6 (six) hours as needed for nausea or vomiting. 04/15/17   Delman Kitten, MD  potassium chloride SA (K-DUR,KLOR-CON) 20 MEQ tablet Take 20 mEq by mouth daily.     [provider]  traMADol (ULTRAM) 50 MG tablet Take 1 tablet (50 mg total) by mouth every 6 (six) hours as needed. 09/19/17   [name], PA-C  triamcinolone cream (KENALOG) 0.1 % Apply 1 application topically 2 (two) times daily. As needed x 10 days max. 12/11/16   [name], MD    Allergies Shellfish allergy and Penicillins  Family History  Problem Relation Age of Onset  . Stroke Neg Hx     Social History Social History   Tobacco Use  . Smoking status: Never Smoker  . Smokeless tobacco: Never Used  Substance Use Topics  . Alcohol use: No  . Drug use: No    Review of Systems Constitutional: No fever/chills Eyes: No visual changes. ENT: No sore throat. Cardiovascular: Denies chest pain. Respiratory: Denies shortness of breath. Gastrointestinal: No abdominal pain.  No nausea, no vomiting.  No diarrhea.  No constipation. Genitourinary: Negative for dysuria. Musculoskeletal: Negative for neck pain.  Negative for back pain. Integumentary: Negative for rash. Neurological: Negative for headaches,  focal weakness or numbness.  Positive for dizziness   ____________________________________________   PHYSICAL EXAM:  VITAL SIGNS: ED Triage Vitals  Enc Vitals Group     BP 02/19/18 2219 (!) 149/87     Pulse Rate 02/19/18 2219 69     Resp 02/19/18 2219 16     Temp 02/19/18 2219 98.1 F (36.7 C)     Temp Source 02/19/18 2219 Oral     SpO2 02/19/18 2219 99 %     Weight 02/19/18 2220 96.2 kg (212 lb)     Height 02/19/18 2220 1.753 m (5\' 9" )     Head Circumference --      Peak Flow --      Pain Score  02/19/18 2225 0     Pain Loc --      Pain Edu? --      Excl. in [place]? --     Constitutional: Alert and oriented. Well appearing and in no acute distress. Eyes: Conjunctivae are normal. PERRL. EOMI. Head: Atraumatic. Ears:  Healthy appearing ear canals and clear fluid noted posterior bilateral TM Nose: No congestion/rhinnorhea. Mouth/Throat: Mucous membranes are moist Oropharynx non-erythematous. Neck: No stridor.   Cardiovascular: Normal rate, regular rhythm. Good peripheral circulation. Grossly normal heart sounds. Respiratory: Normal respiratory effort.  No retractions. Lungs CTAB. Gastrointestinal: Soft and nontender. No distention.  Musculoskeletal: No lower extremity tenderness nor edema. No gross deformities of extremities. Neurologic:  Normal speech and language. No gross focal neurologic deficits are appreciated.  Skin:  Skin is warm, dry and intact. No rash noted.  ____________________________________________   LABS (all labs ordered are listed, but only abnormal results are displayed)  Labs Reviewed  COMPREHENSIVE METABOLIC PANEL - Abnormal; Notable for the following components:      Result Value   Glucose, Bld 110 (*)    Total Protein 8.2 (*)    All other components within normal limits  URINALYSIS, COMPLETE (UACMP) WITH MICROSCOPIC - Abnormal; Notable for the following components:   Color, Urine STRAW (*)    APPearance CLEAR (*)    All other components within normal limits  CBC  TROPONIN I   ____________________________________________  EKG  ED ECG REPORT I, Charlotte N BROWN, the attending physician, personally viewed and interpreted this ECG.   Date: 02/20/2018  EKG Time: 10:12 PM  Rate: 66  Rhythm: Normal sinus rhythm  Axis: Normal  Intervals: Normal  ST&T Change: None  ____________________________________________  RADIOLOGY I, Ethan N BROWN, personally viewed and evaluated these images (plain radiographs) as part of my medical decision making,  as well as reviewing the written report by the radiologist.  ED MD interpretation: Stable negative CT head per radiologist.  Official radiology report(s): Ct Head Wo Contrast  Result Date: 02/20/2018 CLINICAL DATA:  Dizziness and nausea.  Ataxia with stroke suspected EXAM: CT HEAD WITHOUT CONTRAST TECHNIQUE: Contiguous axial images were obtained from the base of the skull through the vertex without intravenous contrast. COMPARISON:  08/15/2017 FINDINGS: Brain: No evidence of acute infarction, hemorrhage, hydrocephalus, extra-axial collection or mass lesion/mass effect. Vascular: No hyperdense vessel or unexpected calcification. High-density along the straight sinus is from calcification based on reformats-stable Skull: Normal. Negative for fracture or focal lesion. Sinuses/Orbits: Negative IMPRESSION: Stable, negative head CT. Electronically Signed   By: Monte Fantasia M.D.   On: 02/20/2018 04:48     Procedures   ____________________________________________   INITIAL IMPRESSION / ASSESSMENT AND PLAN / ED COURSE  As part of my medical decision making, I reviewed the  following data within the [place] NUMBER  68 year old female presented with above-stated history and physical exam secondary to dizziness.  CT head revealed no acute intracranial abnormality.  Clinical exam revealed clear fluid posterior TM bilaterally with recent known history of congestion and as such considered possibility of vertigo.  Patient given meclizine 12.5 mg with resolution of symptoms. ____________________________________________  FINAL CLINICAL IMPRESSION(S) / ED DIAGNOSES  Final diagnoses:  Vertigo     MEDICATIONS GIVEN DURING THIS VISIT:  Medications  meclizine (ANTIVERT) tablet 25 mg (25 mg Oral Given 02/20/18 0442)     ED Discharge Orders         Ordered    meclizine (ANTIVERT) 12.5 MG tablet  3 times daily PRN     02/20/18 0530           Note:  This document was prepared using  Dragon voice recognition software and may include unintentional dictation errors.   Gregor Hams, MD 02/20/18 [phone]

## 2018-02-25 ENCOUNTER — Other Ambulatory Visit: Payer: Self-pay

## 2018-02-25 ENCOUNTER — Encounter: Payer: Self-pay | Admitting: Emergency Medicine

## 2018-02-25 ENCOUNTER — Ambulatory Visit
Admission: EM | Admit: 2018-02-25 | Discharge: 2018-02-25 | Disposition: A | Discharge status: Home / Self Care | Attending: Family Medicine | Admitting: Family Medicine

## 2018-02-25 DIAGNOSIS — J069 Acute upper respiratory infection, unspecified: Secondary | ICD-10-CM | POA: Diagnosis not present

## 2018-02-25 DIAGNOSIS — B9789 Other viral agents as the cause of diseases classified elsewhere: Secondary | ICD-10-CM | POA: Diagnosis not present

## 2018-02-25 MED ORDER — BENZONATATE 200 MG PO CAPS: 200.0000 mg | ORAL_CAPSULE | Freq: Three times a day (TID) | ORAL | 0 refills | Status: DC | PRN

## 2018-02-25 MED ORDER — PREDNISONE 20 MG PO TABS: 20.0000 mg | ORAL_TABLET | Freq: Every day | ORAL | 0 refills | Status: DC

## 2018-02-25 NOTE — ED Triage Notes (Signed)
Patient c/o cough, runny nose, and HAs that started yesterday.

## 2018-02-25 NOTE — ED Provider Notes (Signed)
[facility]    CSN: [id number] Arrival date & time: 02/25/18  1823     History   Chief Complaint Chief Complaint  Patient presents with  . Cough  . Headache    HPI Janet [name] is a 68 y.o. female.   The history is provided by the patient.  URI  Presenting symptoms: congestion, cough, fatigue and rhinorrhea   Severity:  Moderate Onset quality:  Sudden Duration:  1 day Timing:  Constant Progression:  Unchanged Chronicity:  New Relieved by:  None tried Ineffective treatments:  None tried Associated symptoms: no wheezing   Risk factors: sick contacts     Past Medical History:  Diagnosis Date  . Asthma   . Cancer ([place])    kidney  . Hypertension     Patient Active Problem List   Diagnosis Date Noted  . Bilateral cellulitis of lower leg 10/03/2015  . Generalized weakness 09/09/2014  . 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 ABDOMINAL HYSTERECTOMY      OB History    Gravida  3   Para      Term      Preterm      AB      Living  2     SAB      TAB      Ectopic      Multiple      Live Births               Home Medications    Prior to Admission medications   Medication Sig Start Date End Date Taking? Authorizing Provider  aspirin EC 81 MG tablet Take 81 mg by mouth daily.   Yes [provider]  lisinopril (PRINIVIL,ZESTRIL) 40 MG tablet Take 1 tablet by mouth daily. 08/27/14  Yes [provider]  potassium chloride SA (K-DUR,KLOR-CON) 20 MEQ tablet Take 20 mEq by mouth daily.    Yes [provider]  albuterol (PROVENTIL HFA;VENTOLIN HFA) 108 (90 BASE) MCG/ACT inhaler Inhale 2 puffs into the lungs every 6 (six) hours as needed for wheezing or shortness of breath. 12/24/14   [name], MD  benzonatate (TESSALON) 200 MG capsule Take 1 capsule (200 mg total) by mouth 3 (three) times daily as needed. 02/25/18   Norval Gable, MD  doxycycline  (VIBRAMYCIN) 100 MG capsule Take 1 capsule (100 mg total) by mouth 2 (two) times daily. 11/21/17   Coral Spikes, DO  meclizine (ANTIVERT) 12.5 MG tablet Take 1 tablet (12.5 mg total) by mouth 3 (three) times daily as needed for dizziness or nausea. 04/15/17   Delman Kitten, MD  meclizine (ANTIVERT) 12.5 MG tablet Take 1 tablet (12.5 mg total) by mouth 3 (three) times daily as needed for dizziness. 02/20/18   Gregor Hams, MD  naproxen (NAPROSYN) 500 MG tablet Take 1 tablet (500 mg total) by mouth 2 (two) times daily as needed. 11/21/17   Coral Spikes, DO  ondansetron (ZOFRAN ODT) 4 MG disintegrating tablet Take 1 tablet (4 mg total) by mouth every 6 (six) hours as needed for nausea or vomiting. 04/15/17   Delman Kitten, MD  predniSONE (DELTASONE) 20 MG tablet Take 1 tablet (20 mg total) by mouth daily. 02/25/18   Norval Gable, MD  traMADol (ULTRAM) 50 MG tablet Take 1 tablet (50 mg total) by mouth every 6 (six) hours as needed. 09/19/17   [name], PA-C  triamcinolone cream (KENALOG) 0.1 %  Apply 1 application topically 2 (two) times daily. As needed x 10 days max. 12/11/16   [name], MD    Family History Family History  Problem Relation Age of Onset  . Stroke Neg Hx     Social History Social History   Tobacco Use  . Smoking status: Never Smoker  . Smokeless tobacco: Never Used  Substance Use Topics  . Alcohol use: No  . Drug use: No     Allergies   Shellfish allergy and Penicillins   Review of Systems Review of Systems  Constitutional: Positive for fatigue.  HENT: Positive for congestion and rhinorrhea.   Respiratory: Positive for cough. Negative for wheezing.      Physical Exam Triage Vital Signs ED Triage Vitals  Enc Vitals Group     BP 02/25/18 1850 134/74     Pulse Rate 02/25/18 1850 79     Resp 02/25/18 1850 16     Temp 02/25/18 1850 98.7 F (37.1 C)     Temp Source 02/25/18 1850 Oral     SpO2 02/25/18 1850 100 %     Weight 02/25/18 1848 212 lb  (96.2 kg)     Height 02/25/18 1848 5\' 9"  (1.753 m)     Head Circumference --      Peak Flow --      Pain Score 02/25/18 1847 0     Pain Loc --      Pain Edu? --      Excl. in [place]? --    No data found.  Updated Vital Signs BP 134/74 (BP Location: Right Arm)   Pulse 79   Temp 98.7 F (37.1 C) (Oral)   Resp 16   Ht 5\' 9"  (1.753 m)   Wt 96.2 kg   SpO2 100%   BMI 31.31 kg/m   Visual Acuity Right Eye Distance:   Left Eye Distance:   Bilateral Distance:    Right Eye Near:   Left Eye Near:    Bilateral Near:     Physical Exam Vitals signs and nursing note reviewed.  Constitutional:      General: She is not in acute distress.    Appearance: She is well-developed. She is not toxic-appearing or diaphoretic.  HENT:     Head: Normocephalic and atraumatic.     Right Ear: Tympanic membrane, ear canal and external ear normal.     Left Ear: Tympanic membrane, ear canal and external ear normal.     Nose: Mucosal edema and rhinorrhea present. No nasal deformity, septal deviation or laceration.     Mouth/Throat:     Pharynx: Uvula midline. No oropharyngeal exudate.  Eyes:     General: No scleral icterus.       Right eye: No discharge.        Left eye: No discharge.  Neck:     Musculoskeletal: Normal range of motion and neck supple.     Thyroid: No thyromegaly.  Cardiovascular:     Rate and Rhythm: Normal rate and regular rhythm.     Heart sounds: Normal heart sounds.  Pulmonary:     Effort: Pulmonary effort is normal. No respiratory distress.     Breath sounds: Normal breath sounds. No stridor. No wheezing, rhonchi or rales.  Lymphadenopathy:     Cervical: No cervical adenopathy.  Neurological:     Mental Status: She is alert.      UC Treatments / Results  Labs (all labs ordered are listed, but only abnormal results are  displayed) Labs Reviewed - No data to display  EKG None  Radiology No results found.  Procedures Procedures (including critical care  time)  Medications Ordered in UC Medications - No data to display  Initial Impression / Assessment and Plan / UC Course  I have reviewed the triage vital signs and the nursing notes.  Pertinent labs & imaging results that were available during my care of the patient were reviewed by me and considered in my medical decision making (see chart for details).      Final Clinical Impressions(s) / UC Diagnoses   Final diagnoses:  Viral URI with cough     Discharge Instructions     Rest, fluids, over the counter tylenol/advil    ED Prescriptions    Medication Sig Dispense Auth. Provider   predniSONE (DELTASONE) 20 MG tablet Take 1 tablet (20 mg total) by mouth daily. 5 tablet Ersel Enslin, Linward Foster, MD   benzonatate (TESSALON) 200 MG capsule Take 1 capsule (200 mg total) by mouth 3 (three) times daily as needed. 30 capsule Norval Gable, MD     1. diagnosis reviewed with patient 2. rx as per orders above; reviewed possible side effects, interactions, risks and benefits  3. Recommend supportive treatment with rest, fluids, otc meds prn  4. Follow-up prn if symptoms worsen or don't improve  Controlled Substance Prescriptions [place] Controlled Substance Registry consulted? Not Applicable   Norval Gable, MD 02/25/18 [phone]

## 2018-02-25 NOTE — Discharge Instructions (Signed)
Rest, fluids, over the counter tylenol/advil

## 2018-03-13 ENCOUNTER — Other Ambulatory Visit: Payer: Self-pay

## 2018-03-13 ENCOUNTER — Encounter: Payer: Self-pay | Admitting: Emergency Medicine

## 2018-03-13 ENCOUNTER — Ambulatory Visit
Admission: EM | Admit: 2018-03-13 | Discharge: 2018-03-13 | Disposition: A | Discharge status: Home / Self Care | Attending: Family Medicine | Admitting: Family Medicine

## 2018-03-13 DIAGNOSIS — I83028 Varicose veins of left lower extremity with ulcer other part of lower leg: Secondary | ICD-10-CM

## 2018-03-13 DIAGNOSIS — R197 Diarrhea, unspecified: Secondary | ICD-10-CM | POA: Diagnosis not present

## 2018-03-13 DIAGNOSIS — L97829 Non-pressure chronic ulcer of other part of left lower leg with unspecified severity: Secondary | ICD-10-CM | POA: Diagnosis not present

## 2018-03-13 MED ORDER — DIPHENOXYLATE-ATROPINE 2.5-0.025 MG PO TABS: 1.0000 | ORAL_TABLET | Freq: Four times a day (QID) | ORAL | 0 refills | Status: DC | PRN

## 2018-03-13 MED ORDER — DOXYCYCLINE HYCLATE 100 MG PO CAPS: 100.0000 mg | ORAL_CAPSULE | Freq: Two times a day (BID) | ORAL | 0 refills | Status: DC

## 2018-03-13 NOTE — ED Triage Notes (Signed)
Patient c/o painful sore on her left lower leg that started 1 week ago.  Patient c/o diarrhea that started 1 hour ago.

## 2018-03-13 NOTE — Discharge Instructions (Signed)
Medication as prescribed.  Compression stockings.  Take care  Dr. Lacinda Axon

## 2018-03-14 NOTE — ED Provider Notes (Signed)
[facility]    CSN: [id number] Arrival date & time: 03/13/18  2002  History   Chief Complaint Chief Complaint  Patient presents with  . Cellulitis  . Diarrhea   HPI  68 year old female presents with the above complaints.  Patient reports a 1 to 2-week history of a burning sensation in her left lower extremity.  Patient reports that now she has developed a wound on the lateral aspect of her left lower leg.  Patient has longstanding history of venous insufficiency.  She does not wear compression stockings regularly.  Patient is concerned that she may have an infection.  Patient has difficulty visualizing the wound due to body habitus.  Additionally, patient reports that she has developed periumbilical abdominal pain as well as diarrhea today.  Started approximately 1 hour ago.  No documented fever.  No chills.  No other associated symptoms.  No other complaints.  PMH, Surgical Hx, Family Hx, Social History reviewed and updated as below.  Past Medical History:  Diagnosis Date  . Asthma   . Cancer ([place])    kidney  . Hypertension    Past Surgical History:  Procedure Laterality Date  . ABDOMINAL HYSTERECTOMY     OB History    Gravida  3   Para      Term      Preterm      AB      Living  2     SAB      TAB      Ectopic      Multiple      Live Births             Home Medications    Prior to Admission medications   Medication Sig Start Date End Date Taking? Authorizing Provider  albuterol (PROVENTIL HFA;VENTOLIN HFA) 108 (90 BASE) MCG/ACT inhaler Inhale 2 puffs into the lungs every 6 (six) hours as needed for wheezing or shortness of breath. 12/24/14  Yes [name], MD  aspirin EC 81 MG tablet Take 81 mg by mouth daily.   Yes [provider]  lisinopril (PRINIVIL,ZESTRIL) 40 MG tablet Take 1 tablet by mouth daily. 08/27/14  Yes [provider]  meclizine (ANTIVERT) 12.5 MG tablet Take 1 tablet (12.5 mg total) by mouth 3  (three) times daily as needed for dizziness or nausea. 04/15/17  Yes Delman Kitten, MD  potassium chloride SA (K-DUR,KLOR-CON) 20 MEQ tablet Take 20 mEq by mouth daily.    Yes [provider]  diphenoxylate-atropine (LOMOTIL) 2.5-0.025 MG tablet Take 1 tablet by mouth 4 (four) times daily as needed for diarrhea or loose stools. 03/13/18   Coral Spikes, DO  doxycycline (VIBRAMYCIN) 100 MG capsule Take 1 capsule (100 mg total) by mouth 2 (two) times daily. 03/13/18   Coral Spikes, DO  meclizine (ANTIVERT) 12.5 MG tablet Take 1 tablet (12.5 mg total) by mouth 3 (three) times daily as needed for dizziness. 02/20/18   Gregor Hams, MD  triamcinolone cream (KENALOG) 0.1 % Apply 1 application topically 2 (two) times daily. As needed x 10 days max. 12/11/16   [name], MD   Family History Family History  Problem Relation Age of Onset  . Stroke Neg Hx    Social History Social History   Tobacco Use  . Smoking status: Never Smoker  . Smokeless tobacco: Never Used  Substance Use Topics  . Alcohol use: No  . Drug use: No    Allergies  Shellfish allergy and Penicillins  Review of Systems Review of Systems  Constitutional: Negative for fever.  Gastrointestinal: Positive for abdominal pain and diarrhea.  Skin: Positive for wound.  Psychiatric/Behavioral: Decreased concentration:    Physical Exam Triage Vital Signs ED Triage Vitals  Enc Vitals Group     BP 03/13/18 2015 (!) 160/75     Pulse Rate 03/13/18 2015 69     Resp 03/13/18 2015 18     Temp 03/13/18 2015 98.3 F (36.8 C)     Temp Source 03/13/18 2015 Oral     SpO2 03/13/18 2015 100 %     Weight 03/13/18 2013 212 lb (96.2 kg)     Height 03/13/18 2013 5\' 9"  (1.753 m)     Head Circumference --      Peak Flow --      Pain Score 03/13/18 2013 6     Pain Loc --      Pain Edu? --      Excl. in [place]? --    Updated Vital Signs BP (!) 160/75 (BP Location: Right Arm)   Pulse 69   Temp 98.3 F (36.8 C) (Oral)   Resp  18   Ht 5\' 9"  (1.753 m)   Wt 96.2 kg   SpO2 100%   BMI 31.31 kg/m   Visual Acuity Right Eye Distance:   Left Eye Distance:   Bilateral Distance:    Right Eye Near:   Left Eye Near:    Bilateral Near:     Physical Exam Vitals signs and nursing note reviewed.  Constitutional:      General: She is not in acute distress.    Appearance: Normal appearance.  HENT:     Head: Normocephalic and atraumatic.  Eyes:     General:        Right eye: No discharge.        Left eye: No discharge.     Conjunctiva/sclera: Conjunctivae normal.  Cardiovascular:     Rate and Rhythm: Normal rate and regular rhythm.  Pulmonary:     Effort: Pulmonary effort is normal.     Breath sounds: Normal breath sounds.  Abdominal:     General: There is no distension.     Palpations: Abdomen is soft.     Tenderness: There is no abdominal tenderness.  Skin:    Comments: Left lower extremity - small open wound. Minimal drainage noted. 2-3+ edema with hyperpigmentation consistent with chronic venous insufficiency.   Neurological:     Mental Status: She is alert.  Psychiatric:        Mood and Affect: Mood normal.        Behavior: Behavior normal.    UC Treatments / Results  Labs (all labs ordered are listed, but only abnormal results are displayed) Labs Reviewed - No data to display  EKG None  Radiology No results found.  Procedures Procedures (including critical care time)  Medications Ordered in UC Medications - No data to display  Initial Impression / Assessment and Plan / UC Course  I have reviewed the triage vital signs and the nursing notes.  Pertinent labs & imaging results that were available during my care of the patient were reviewed by me and considered in my medical decision making (see chart for details).    68 year old female presents with a venous stasis ulcer.  Placing on doxycycline.  Compression stockings advised.  Paper Rx given for compression stockings.  Lomotil for  diarrhea.  Supportive care.  Final  Clinical Impressions(s) / UC Diagnoses   Final diagnoses:  Venous stasis ulcer of other part of left lower leg with varicose veins, unspecified ulcer stage (HCC)  Diarrhea, unspecified type     Discharge Instructions     Medication as prescribed.  Compression stockings.  Take care  Dr. Lacinda Axon    ED Prescriptions    Medication Sig Dispense Auth. Provider   doxycycline (VIBRAMYCIN) 100 MG capsule Take 1 capsule (100 mg total) by mouth 2 (two) times daily. 14 capsule Lataria Courser G, DO   diphenoxylate-atropine (LOMOTIL) 2.5-0.025 MG tablet Take 1 tablet by mouth 4 (four) times daily as needed for diarrhea or loose stools. 20 tablet Coral Spikes, DO     Controlled Substance Prescriptions [place] Controlled Substance Registry consulted? Not Applicable   Coral Spikes, DO 03/14/18 [phone]

## 2018-04-05 ENCOUNTER — Ambulatory Visit
Admission: EM | Admit: 2018-04-05 | Discharge: 2018-04-05 | Disposition: A | Discharge status: Home / Self Care | Payer: Medicare PPO | Attending: Family Medicine | Admitting: Family Medicine

## 2018-04-05 ENCOUNTER — Encounter: Payer: Self-pay | Admitting: Emergency Medicine

## 2018-04-05 ENCOUNTER — Other Ambulatory Visit: Payer: Self-pay

## 2018-04-05 DIAGNOSIS — I83029 Varicose veins of left lower extremity with ulcer of unspecified site: Secondary | ICD-10-CM | POA: Diagnosis not present

## 2018-04-05 DIAGNOSIS — L97929 Non-pressure chronic ulcer of unspecified part of left lower leg with unspecified severity: Secondary | ICD-10-CM

## 2018-04-05 MED ORDER — DOXYCYCLINE HYCLATE 100 MG PO TABS: 100.0000 mg | ORAL_TABLET | Freq: Two times a day (BID) | ORAL | 0 refills | Status: DC

## 2018-04-05 NOTE — ED Triage Notes (Signed)
Left leg pain and burning. Seen 1 month ago.

## 2018-04-05 NOTE — ED Provider Notes (Signed)
[facility]    CSN: [id number] Arrival date & time: 04/05/18  1525     History   Chief Complaint Chief Complaint  Patient presents with   Leg Pain    HPI Janet [name] is a 68 y.o. female.   68 yo female with a h/o venous stasis presents with c/o left leg pain and skin ulcer for the past several weeks. States she was seen here about 1 month ago and given antibiotic for one week. States seemed to be improving but worsened again over the last week. Denies any fevers, chills.   The history is provided by the patient.  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 Authorizing Provider  albuterol (PROVENTIL HFA;VENTOLIN HFA) 108 (90 BASE) MCG/ACT inhaler Inhale 2 puffs into the lungs every 6 (six) hours as needed for wheezing or shortness of breath. 12/24/14  Yes [name], MD  aspirin EC 81 MG tablet Take 81 mg by mouth daily.   Yes [provider]  Butalbital-APAP-Caffeine 50-300-40 MG CAPS take 1 to 2 capsules by mouth every 6 hours if needed for headache 07/30/17  Yes [provider]  diphenoxylate-atropine (LOMOTIL) 2.5-0.025 MG tablet Take 1 tablet by mouth 4 (four) times daily as needed for diarrhea or loose stools. 03/13/18  Yes Coral Spikes, DO  Elastic Bandages & Supports (EMS ANTI-EMBOLISM STOCKINGS) MISC Apply in the morning and remove at  night. 06/23/14  Yes [provider]  famotidine (PEPCID) 20 MG tablet Take by mouth. 09/12/14  Yes [provider]  lisinopril (PRINIVIL,ZESTRIL) 40 MG tablet Take 1 tablet by mouth daily. 08/27/14  Yes [provider]  meclizine (ANTIVERT) 12.5 MG tablet Take 1 tablet (12.5 mg total) by mouth 3 (three) times daily as needed for dizziness or nausea. 04/15/17  Yes Delman Kitten, MD  potassium chloride SA (K-DUR,KLOR-CON) 20 MEQ tablet Take 20 mEq by mouth daily.    Yes [provider]  triamcinolone cream (KENALOG) 0.1 % Apply 1 application topically 2 (two) times daily. As needed x 10 days max. 12/11/16  Yes [name], MD  doxycycline (VIBRA-TABS) 100 MG tablet Take 1 tablet (100 mg total) by mouth 2 (two) times daily. 04/05/18   Norval Gable, MD  meclizine (ANTIVERT) 12.5 MG tablet Take 1 tablet (12.5 mg total) by mouth 3 (three) times daily as needed for dizziness. 02/20/18   Gregor Hams, MD    Family History Family History  Problem Relation Age of Onset   Stroke Neg Hx  Social History Social History   Tobacco Use   Smoking status: Never Smoker   Smokeless tobacco: Never Used  Substance Use Topics   Alcohol use: No   Drug use: No     Allergies   Shellfish allergy and Penicillins   Review of Systems Review of Systems   Physical Exam Triage Vital Signs ED Triage Vitals  Enc Vitals Group     BP 04/05/18 1534 (!) 145/66     Pulse Rate 04/05/18 1534 72     Resp 04/05/18 1534 16     Temp 04/05/18 1534 98.9 F (37.2 C)     Temp src --      SpO2 04/05/18 1534 97 %     Weight --      Height --      Head Circumference --      Peak Flow --      Pain Score 04/05/18 1530 8     Pain Loc --      Pain Edu? --      Excl. in [place]? --    No data found.  Updated Vital Signs BP (!) 145/66 (BP Location: Right Arm)    Pulse 72    Temp 98.9 F (37.2 C)    Resp 16    SpO2 97%   Visual Acuity Right Eye Distance:   Left Eye  Distance:   Bilateral Distance:    Right Eye Near:   Left Eye Near:    Bilateral Near:     Physical Exam Vitals signs and nursing note reviewed.  Constitutional:      General: She is not in acute distress.    Appearance: She is not ill-appearing, toxic-appearing or diaphoretic.  Musculoskeletal:     Left lower leg: Edema present.     Comments: Left lower extremity skin venous stasis changes and 1.5cm superficial ulceration with slight drainage  Neurological:     Mental Status: She is alert.      UC Treatments / Results  Labs (all labs ordered are listed, but only abnormal results are displayed) Labs Reviewed - No data to display  EKG None  Radiology No results found.  Procedures Procedures (including critical care time)  Medications Ordered in UC Medications - No data to display  Initial Impression / Assessment and Plan / UC Course  I have reviewed the triage vital signs and the nursing notes.  Pertinent labs & imaging results that were available during my care of the patient were reviewed by me and considered in my medical decision making (see chart for details).      Final Clinical Impressions(s) / UC Diagnoses   Final diagnoses:  Ulcer of varicose vein of left lower extremity [facility])    ED Prescriptions    Medication Sig Dispense Auth. Provider   doxycycline (VIBRA-TABS) 100 MG tablet Take 1 tablet (100 mg total) by mouth 2 (two) times daily. 28 tablet Ilianna Bown, Linward Foster, MD      1. diagnosis reviewed with patient 2. rx as per orders above; reviewed possible side effects, interactions, risks and benefits  3. Recommend supportive treatment with routine wound care; monitor 4. Follow-up with PCP on Monday 5. Follow up prn if symptoms worsen or don't improve Controlled Substance Prescriptions [place] Controlled Substance Registry consulted? Not Applicable   Norval Gable, MD 04/05/18 [phone]

## 2018-04-27 ENCOUNTER — Other Ambulatory Visit: Payer: Self-pay

## 2018-04-27 ENCOUNTER — Ambulatory Visit
Admission: EM | Admit: 2018-04-27 | Discharge: 2018-04-27 | Disposition: A | Discharge status: Home / Self Care | Payer: Medicare PPO | Attending: Family Medicine | Admitting: Family Medicine

## 2018-04-27 DIAGNOSIS — I83029 Varicose veins of left lower extremity with ulcer of unspecified site: Secondary | ICD-10-CM | POA: Diagnosis not present

## 2018-04-27 DIAGNOSIS — Z86718 Personal history of other venous thrombosis and embolism: Secondary | ICD-10-CM | POA: Diagnosis not present

## 2018-04-27 DIAGNOSIS — L97929 Non-pressure chronic ulcer of unspecified part of left lower leg with unspecified severity: Secondary | ICD-10-CM

## 2018-04-27 MED ORDER — SILVER SULFADIAZINE 1 % EX CREA: 1.0000 "application " | TOPICAL_CREAM | Freq: Two times a day (BID) | CUTANEOUS | 0 refills | Status: DC

## 2018-04-27 MED ORDER — DOXYCYCLINE HYCLATE 100 MG PO TABS: 100.0000 mg | ORAL_TABLET | Freq: Two times a day (BID) | ORAL | 0 refills | Status: DC

## 2018-04-27 NOTE — ED Provider Notes (Signed)
[facility]    CSN: [id number] Arrival date & time: 04/27/18  1505     History   Chief Complaint Chief Complaint  Patient presents with  . Leg Pain    HPI Janet [name] is a 68 y.o. female.   68 yo female with a h/o venous stasis presents with a c/o continuing non-healing ulcer on her left leg. Patient has been seen her several times for this, however has not followed up with her PCP. Denies any fevers or chills.   The history is provided by the patient.  Leg Pain    Past Medical History:  Diagnosis Date  . Asthma   . Cancer ([place])    kidney  . Hypertension     Patient Active Problem List   Diagnosis Date Noted  . Bilateral cellulitis of lower leg 10/03/2015  . Generalized weakness 09/09/2014  . 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 ABDOMINAL HYSTERECTOMY      OB History    Gravida  3   Para      Term      Preterm      AB      Living  2     SAB      TAB      Ectopic      Multiple      Live Births               Home Medications    Prior to Admission medications   Medication Sig Start Date End Date Taking? Authorizing Provider  albuterol (PROVENTIL HFA;VENTOLIN HFA) 108 (90 BASE) MCG/ACT inhaler Inhale 2 puffs into the lungs every 6 (six) hours as needed for wheezing or shortness of breath. 12/24/14   [name], MD  aspirin EC 81 MG tablet Take 81 mg by mouth daily.    [provider]  Butalbital-APAP-Caffeine 50-300-40 MG CAPS take 1 to 2 capsules by mouth every 6 hours if needed for headache 07/30/17   [provider]  diphenoxylate-atropine (LOMOTIL) 2.5-0.025 MG tablet Take 1 tablet by mouth 4 (four) times daily as needed for diarrhea or loose stools. 03/13/18   Coral Spikes, DO  doxycycline (VIBRA-TABS) 100 MG tablet Take 1 tablet (100 mg total) by mouth 2 (two) times daily. 04/27/18   Norval Gable, MD  Elastic Bandages & Supports (EMS ANTI-EMBOLISM  STOCKINGS) MISC Apply in the morning and remove at night. 06/23/14   [provider]  famotidine (PEPCID) 20 MG tablet Take by mouth. 09/12/14   [provider]  lisinopril (PRINIVIL,ZESTRIL) 40 MG tablet Take 1 tablet by mouth daily. 08/27/14   [provider]  meclizine (ANTIVERT) 12.5 MG tablet Take 1 tablet (12.5 mg total) by mouth 3 (three) times daily as needed for dizziness or nausea. 04/15/17   Delman Kitten, MD  meclizine (ANTIVERT) 12.5 MG tablet Take 1 tablet (12.5 mg total) by mouth 3 (three) times daily as needed for dizziness. 02/20/18   Gregor Hams, MD  potassium chloride SA (K-DUR,KLOR-CON) 20 MEQ tablet Take 20 mEq by mouth daily.     [provider]  silver sulfADIAZINE (SILVADENE) 1 % cream Apply 1 application topically 2 (two) times daily. 04/27/18   Norval Gable, MD  triamcinolone cream (KENALOG) 0.1 % Apply 1 application topically 2 (two) times daily. As needed x 10 days max. 12/11/16   [name], MD    Family History Family History  Problem Relation  Age of Onset  . Stroke Neg Hx     Social History Social History   Tobacco Use  . Smoking status: Never Smoker  . Smokeless tobacco: Never Used  Substance Use Topics  . Alcohol use: No  . Drug use: No     Allergies   Shellfish allergy and Penicillins   Review of Systems Review of Systems   Physical Exam Triage Vital Signs ED Triage Vitals  Enc Vitals Group     BP 04/27/18 1510 (!) 163/95     Pulse Rate 04/27/18 1510 69     Resp 04/27/18 1510 18     Temp 04/27/18 1510 98.2 F (36.8 C)     Temp Source 04/27/18 1510 Oral     SpO2 04/27/18 1510 99 %     Weight 04/27/18 1512 212 lb 1.3 oz (96.2 kg)     Height 04/27/18 1512 5\' 9"  (1.753 m)     Head Circumference --      Peak Flow --      Pain Score 04/27/18 1512 8     Pain Loc --      Pain Edu? --      Excl. in [place]? --    No data found.  Updated Vital Signs BP (!) 163/95 (BP Location: Left Arm)   Pulse 69    Temp 98.2 F (36.8 C) (Oral)   Resp 18   Ht 5\' 9"  (1.753 m)   Wt 96.2 kg   SpO2 99%   BMI 31.32 kg/m   Visual Acuity Right Eye Distance:   Left Eye Distance:   Bilateral Distance:    Right Eye Near:   Left Eye Near:    Bilateral Near:     Physical Exam Vitals signs and nursing note reviewed.  Constitutional:      General: She is not in acute distress.    Appearance: She is not toxic-appearing or diaphoretic.  Skin:    Comments: Left lower leg with approx 1.5cm superficial ulceration with minimal drainage  Neurological:     Mental Status: She is alert.      UC Treatments / Results  Labs (all labs ordered are listed, but only abnormal results are displayed) Labs Reviewed - No data to display  EKG None  Radiology No results found.  Procedures Procedures (including critical care time)  Medications Ordered in UC Medications - No data to display  Initial Impression / Assessment and Plan / UC Course  I have reviewed the triage vital signs and the nursing notes.  Pertinent labs & imaging results that were available during my care of the patient were reviewed by me and considered in my medical decision making (see chart for details).      Final Clinical Impressions(s) / UC Diagnoses   Final diagnoses:  Ulcer of varicose vein of left leg [facility])    ED Prescriptions    Medication Sig Dispense Auth. Provider   silver sulfADIAZINE (SILVADENE) 1 % cream Apply 1 application topically 2 (two) times daily. 50 g Norval Gable, MD   doxycycline (VIBRA-TABS) 100 MG tablet Take 1 tablet (100 mg total) by mouth 2 (two) times daily. 20 tablet Norval Gable, MD     1. diagnosis reviewed with patient 2. rx as per orders above; reviewed possible side effects, interactions, risks and benefits  3. Recommend supportive treatment with routine wound care and continue compression stockings 4. Recommend patient follow up with PCP for further evaluation and referral to wound  clinic 5.  Follow-up prn   Controlled Substance Prescriptions [place] Controlled Substance Registry consulted? Not Applicable   Norval Gable, MD 04/27/18 [phone]

## 2018-04-27 NOTE — ED Triage Notes (Signed)
Pt states she has been seen for same. Has burning pain in left leg.

## 2018-05-01 ENCOUNTER — Other Ambulatory Visit (INDEPENDENT_AMBULATORY_CARE_PROVIDER_SITE_OTHER): Payer: Self-pay | Admitting: Vascular Surgery

## 2018-05-01 DIAGNOSIS — M79604 Pain in right leg: Secondary | ICD-10-CM

## 2018-05-01 DIAGNOSIS — M79605 Pain in left leg: Principal | ICD-10-CM

## 2018-05-02 ENCOUNTER — Ambulatory Visit (INDEPENDENT_AMBULATORY_CARE_PROVIDER_SITE_OTHER): Payer: Medicare PPO

## 2018-05-02 ENCOUNTER — Other Ambulatory Visit: Payer: Self-pay

## 2018-05-02 ENCOUNTER — Ambulatory Visit (INDEPENDENT_AMBULATORY_CARE_PROVIDER_SITE_OTHER): Payer: Medicare PPO | Admitting: Vascular Surgery

## 2018-05-02 ENCOUNTER — Encounter (INDEPENDENT_AMBULATORY_CARE_PROVIDER_SITE_OTHER): Payer: Self-pay | Admitting: Vascular Surgery

## 2018-05-02 DIAGNOSIS — I10 Essential (primary) hypertension: Secondary | ICD-10-CM | POA: Diagnosis not present

## 2018-05-02 DIAGNOSIS — L97929 Non-pressure chronic ulcer of unspecified part of left lower leg with unspecified severity: Secondary | ICD-10-CM

## 2018-05-02 DIAGNOSIS — M79604 Pain in right leg: Secondary | ICD-10-CM | POA: Diagnosis not present

## 2018-05-02 DIAGNOSIS — R600 Localized edema: Secondary | ICD-10-CM | POA: Diagnosis not present

## 2018-05-02 DIAGNOSIS — M79605 Pain in left leg: Secondary | ICD-10-CM | POA: Diagnosis not present

## 2018-05-02 DIAGNOSIS — I83029 Varicose veins of left lower extremity with ulcer of unspecified site: Secondary | ICD-10-CM | POA: Diagnosis not present

## 2018-05-02 DIAGNOSIS — M7989 Other specified soft tissue disorders: Secondary | ICD-10-CM

## 2018-05-02 NOTE — Assessment & Plan Note (Signed)
The patient had no arterial insufficiency which is encouraging.  She does have a significant ulceration which is likely a venous ulcer on the left leg.  A 3 layer Unna boot was placed today and will be changed weekly.  I suspect she will need this for 3 to 4 weeks.  A venous reflux study will be done on both lower extremities to assess her venous system for thrombotic or reflux issues.  She will continue to wear her compression stocking on her right leg and once we get out of Unna boots on the left leg she will need to wear compression stocking rigorously.  I suspect there is a component of lymphedema from chronic scarring and lymphatic channels and the lymphedema pump may be an adjuvant option in the future as well.  I have discussed the pathophysiology and natural history of venous disease with the patient.  She voices her understanding and is agreeable with our plan of care.

## 2018-05-02 NOTE — Progress Notes (Signed)
Patient ID: Janet [name], female   DOB: 03-02-1950, 68 y.o.   MRN: [id number]  Chief Complaint  Patient presents with  . New Patient (Initial Visit)    ref Lennox Grumbles for left leg pain/ulcer    HPI Janet [name] is a 68 y.o. female.  I am asked to see the patient by Dr. Lennox Grumbles for evaluation of ulceration of the left lower extremity.  The patient does describe a previous history of phlebitis in the leg many years ago.  She has had troubles with discoloration and swelling in that leg for many years.  She has developed discoloration and swelling in the right leg over the past couple of years.  No fevers or chills.  She had weeping from the area and they did a wrap a few weeks ago but it did not help.  The area is painful.  She does wear compression stockings which help keep her swelling under some degree of control.  The discoloration is quite prominent in both legs. To assess her arterial perfusion noninvasive studies were performed today.  Her ABIs were normal at 1.17 on the right and 1.24 on the left with brisk triphasic waveforms and normal digital pressures consistent with no arterial insufficiency.     Past Medical History:  Diagnosis Date  . Asthma   . Cancer ([place])    kidney  . Hypertension   Phlebitis  Past Surgical History:  Procedure Laterality Date  . ABDOMINAL HYSTERECTOMY      Family History Family History  Problem Relation Age of Onset  . Stroke Neg Hx   No bleeding disorders, clotting disorders, autoimmune diseases, or aneurysms  Social History Social History   Tobacco Use  . Smoking status: Never Smoker  . Smokeless tobacco: Never Used  Substance Use Topics  . Alcohol use: No  . Drug use: No    Allergies  Allergen Reactions  . Shellfish Allergy Anaphylaxis  . Penicillins Hives and Other (See Comments)    Has patient had a PCN reaction causing immediate rash, facial/tongue/throat swelling, SOB or lightheadedness with hypotension: no Has patient had a PCN  reaction causing severe rash involving mucus membranes or skin necrosis: no Has patient had a PCN reaction that required hospitalization? no Has patient had a PCN reaction occurring within the last 10 years: no If all of the above answers are "NO", then may proceed with Cephalosporin use.     Current Outpatient Medications  Medication Sig Dispense Refill  . albuterol (PROVENTIL HFA;VENTOLIN HFA) 108 (90 BASE) MCG/ACT inhaler Inhale 2 puffs into the lungs every 6 (six) hours as needed for wheezing or shortness of breath. 1 Inhaler 0  . aspirin EC 81 MG tablet Take 81 mg by mouth daily.    . Butalbital-APAP-Caffeine 50-300-40 MG CAPS take 1 to 2 capsules by mouth every 6 hours if needed for headache    . diphenoxylate-atropine (LOMOTIL) 2.5-0.025 MG tablet Take 1 tablet by mouth 4 (four) times daily as needed for diarrhea or loose stools. 20 tablet 0  . doxycycline (VIBRA-TABS) 100 MG tablet Take 1 tablet (100 mg total) by mouth 2 (two) times daily. 20 tablet 0  . Elastic Bandages & Supports (EMS ANTI-EMBOLISM STOCKINGS) MISC Apply in the morning and remove at night.    . famotidine (PEPCID) 20 MG tablet Take by mouth.    Marland Kitchen lisinopril (PRINIVIL,ZESTRIL) 40 MG tablet Take 1 tablet by mouth daily.  0  . meclizine (ANTIVERT) 12.5 MG tablet Take 1 tablet (  12.5 mg total) by mouth 3 (three) times daily as needed for dizziness or nausea. 30 tablet 0  . meclizine (ANTIVERT) 12.5 MG tablet Take 1 tablet (12.5 mg total) by mouth 3 (three) times daily as needed for dizziness. 30 tablet 0  . potassium chloride SA (K-DUR,KLOR-CON) 20 MEQ tablet Take 20 mEq by mouth daily.     . silver sulfADIAZINE (SILVADENE) 1 % cream Apply 1 application topically 2 (two) times daily. 50 g 0  . triamcinolone cream (KENALOG) 0.1 % Apply 1 application topically 2 (two) times daily. As needed x 10 days max. 30 g 0   No current facility-administered medications for this visit.       REVIEW OF SYSTEMS (Negative unless  checked)  Constitutional: [] Weight loss  [] Fever  [] Chills Cardiac: [] Chest pain   [] Chest pressure   [] Palpitations   [] Shortness of breath when laying flat   [] Shortness of breath at rest   [] Shortness of breath with exertion. Vascular:  [] Pain in legs with walking   [] Pain in legs at rest   [] Pain in legs when laying flat   [] Claudication   [] Pain in feet when walking  [] Pain in feet at rest  [] Pain in feet when laying flat   [x] History of DVT   [x] Phlebitis   [x] Swelling in legs   [x] Varicose veins   [x] Non-healing ulcers Pulmonary:   [] Uses home oxygen   [] Productive cough   [] Hemoptysis   [] Wheeze  [] COPD   [x] Asthma Neurologic:  [] Dizziness  [] Blackouts   [] Seizures   [] History of stroke   [] History of TIA  [] Aphasia   [] Temporary blindness   [] Dysphagia   [] Weakness or numbness in arms   [] Weakness or numbness in legs Musculoskeletal:  [x] Arthritis   [] Joint swelling   [x] Joint pain   [] Low back pain Hematologic:  [] Easy bruising  [] Easy bleeding   [] Hypercoagulable state   [] Anemic  [] Hepatitis Gastrointestinal:  [] Blood in stool   [] Vomiting blood  [] Gastroesophageal reflux/heartburn   [] Abdominal pain Genitourinary:  [] Chronic kidney disease   [] Difficult urination  [] Frequent urination  [] Burning with urination   [] Hematuria Skin:  [] Rashes   [x] Ulcers   [x] Wounds Psychological:  [] History of anxiety   []  History of major depression.    Physical Exam BP (!) 171/78 (BP Location: Right Arm)   Pulse 74   Resp 16   Ht 5\' 9"  (1.753 m)   Wt 212 lb (96.2 kg)   BMI 31.31 kg/m  Gen:  WD/WN, NAD.  Appears younger than stated age  Head: [place]/AT, No temporalis wasting. Ear/Nose/Throat: Hearing grossly intact, nares w/o erythema or drainage, oropharynx w/o Erythema/Exudate Eyes: Conjunctiva clear, sclera non-icteric  Neck: trachea midline.  No JVD.  Pulmonary:  Good air movement, respirations not labored, no use of accessory muscles  Cardiac: RRR, no JVD Vascular:  Vessel Right Left   Radial Palpable Palpable                          DP  2+  1+  PT  1+  1+   Gastrointestinal:. No masses, surgical incisions, or scars. Musculoskeletal: M/S 5/5 throughout.  Extremities without ischemic changes.  No deformity or atrophy.  Moderate to severe stasis dermatitis changes bilaterally.  Approximately 2 cm irregular ulceration on the lateral aspect of the left lower leg.  2+ bilateral lower extremity edema. Neurologic: Sensation grossly intact in extremities.  Symmetrical.  Speech is fluent. Motor exam as listed above. Psychiatric: Judgment intact,  Mood & affect appropriate for pt's clinical situation. Dermatologic: left leg ulceration as above    Radiology No results found.  Labs Recent Results (from the past 2160 hour(s))  CBC     Status: None   Collection Time: 02/19/18 10:20 PM  Result Value Ref Range   WBC 6.9 4.0 - 10.5 K/uL   RBC 4.70 3.87 - 5.11 MIL/uL   Hemoglobin 13.1 12.0 - 15.0 g/dL   HCT 41.6 36.0 - 46.0 %   MCV 88.5 80.0 - 100.0 fL   MCH 27.9 26.0 - 34.0 pg   MCHC 31.5 30.0 - 36.0 g/dL   RDW 13.2 11.5 - 15.5 %   Platelets 335 150 - 400 K/uL   nRBC 0.0 0.0 - 0.2 %    Comment: Performed at [facility], [address] [place], [place] [address]  Comprehensive metabolic panel     Status: Abnormal   Collection Time: 02/19/18 10:20 PM  Result Value Ref Range   Sodium 138 135 - 145 mmol/L   Potassium 3.8 3.5 - 5.1 mmol/L   Chloride 109 98 - 111 mmol/L   CO2 22 22 - 32 mmol/L   Glucose, Bld 110 (H) 70 - 99 mg/dL   BUN 15 8 - 23 mg/dL   Creatinine, Ser 0.63 0.44 - 1.00 mg/dL   Calcium 9.5 8.9 - 10.3 mg/dL   Total Protein 8.2 (H) 6.5 - 8.1 g/dL   Albumin 4.1 3.5 - 5.0 g/dL   AST 18 15 - 41 U/L   ALT 12 0 - 44 U/L   Alkaline Phosphatase 47 38 - 126 U/L   Total Bilirubin 0.6 0.3 - 1.2 mg/dL   GFR calc non Af Amer >60 >60 mL/min   GFR calc Af Amer >60 >60 mL/min   Anion gap 7 5 - 15    Comment: Performed at [facility], [address] [place], [place] [address]  Troponin I - ONCE - STAT     Status: None   Collection Time: 02/19/18 10:20 PM  Result Value Ref Range   Troponin I <0.03 <0.03 ng/mL    Comment: Performed at [facility], [address] [place], [place] [address]  Urinalysis, Complete w Microscopic     Status: Abnormal   Collection Time: 02/19/18 10:20 PM  Result Value Ref Range   Color, Urine STRAW (A) YELLOW   APPearance CLEAR (A) CLEAR   Specific Gravity, Urine 1.012 1.005 - 1.030   pH 6.0 5.0 - 8.0   Glucose, UA NEGATIVE NEGATIVE mg/dL   Hgb urine dipstick NEGATIVE NEGATIVE   Bilirubin Urine NEGATIVE NEGATIVE   Ketones, ur NEGATIVE NEGATIVE mg/dL   Protein, ur NEGATIVE NEGATIVE mg/dL   Nitrite NEGATIVE NEGATIVE   Leukocytes, UA NEGATIVE NEGATIVE   RBC / HPF 0-5 0 - 5 RBC/hpf   WBC, UA 0-5 0 - 5 WBC/hpf   Bacteria, UA NONE SEEN NONE SEEN   Squamous Epithelial / LPF 0-5 0 - 5   Mucus PRESENT     Comment: Performed at [facility], [address] [place],  [address]    Assessment/Plan:  Essential hypertension blood pressure control important in reducing the progression of atherosclerotic disease. On appropriate oral medications.   Swelling of limb There is likely a postphlebitic component but also some degree of lymphedema present.  She is wearing compression stockings with fair control.  A venous reflux study will be done in the near future at her convenience.  She  will continue to wear the compression stockings on the right leg and we are going to an Unna boot on the left leg.  Varicose veins with ulcer, left ([place]) The patient had no arterial insufficiency which is encouraging.  She does have a significant ulceration which is likely a venous ulcer on the left leg.  A 3 layer Unna boot was placed today and will be changed weekly.  I suspect she will need this for 3 to 4 weeks.  A venous reflux study will be done on both lower extremities to assess her  venous system for thrombotic or reflux issues.  She will continue to wear her compression stocking on her right leg and once we get out of Unna boots on the left leg she will need to wear compression stocking rigorously.  I suspect there is a component of lymphedema from chronic scarring and lymphatic channels and the lymphedema pump may be an adjuvant option in the future as well.  I have discussed the pathophysiology and natural history of venous disease with the patient.  She voices her understanding and is agreeable with our plan of care.      Leotis Pain 05/02/2018, 10:42 AM   This note was created with Dragon medical transcription system.  Any errors from dictation are unintentional.

## 2018-05-02 NOTE — Assessment & Plan Note (Signed)
There is likely a postphlebitic component but also some degree of lymphedema present.  She is wearing compression stockings with fair control.  A venous reflux study will be done in the near future at her convenience.  She will continue to wear the compression stockings on the right leg and we are going to an Unna boot on the left leg.

## 2018-05-02 NOTE — Assessment & Plan Note (Signed)
blood pressure control important in reducing the progression of atherosclerotic disease. On appropriate oral medications.  

## 2018-05-09 ENCOUNTER — Encounter (INDEPENDENT_AMBULATORY_CARE_PROVIDER_SITE_OTHER): Payer: Non-veteran care

## 2018-05-09 ENCOUNTER — Ambulatory Visit (INDEPENDENT_AMBULATORY_CARE_PROVIDER_SITE_OTHER): Payer: Non-veteran care | Admitting: Vascular Surgery

## 2018-05-09 ENCOUNTER — Encounter (INDEPENDENT_AMBULATORY_CARE_PROVIDER_SITE_OTHER): Payer: Self-pay

## 2018-05-09 ENCOUNTER — Ambulatory Visit (INDEPENDENT_AMBULATORY_CARE_PROVIDER_SITE_OTHER): Payer: Non-veteran care

## 2018-05-09 ENCOUNTER — Other Ambulatory Visit: Payer: Self-pay

## 2018-05-09 VITALS — BP 170/78 | HR 66 | Resp 16 | Ht 69.0 in | Wt 213.0 lb

## 2018-05-09 DIAGNOSIS — I83029 Varicose veins of left lower extremity with ulcer of unspecified site: Secondary | ICD-10-CM

## 2018-05-09 DIAGNOSIS — L97929 Non-pressure chronic ulcer of unspecified part of left lower leg with unspecified severity: Secondary | ICD-10-CM

## 2018-05-09 NOTE — Progress Notes (Signed)
History of Present Illness  Venous ulcer left leg  Assessments & Plan   There are no diagnoses linked to this encounter.    Additional instructions  Subjective:  Patient presents with venous ulcer of the Left lower extremity.    Procedure:  3 layer unna wrap was placed Left lower extremity.   Plan:   Follow up in one week.

## 2018-05-16 ENCOUNTER — Ambulatory Visit (INDEPENDENT_AMBULATORY_CARE_PROVIDER_SITE_OTHER): Payer: Non-veteran care | Admitting: Nurse Practitioner

## 2018-05-16 ENCOUNTER — Other Ambulatory Visit: Payer: Self-pay

## 2018-05-16 ENCOUNTER — Encounter (INDEPENDENT_AMBULATORY_CARE_PROVIDER_SITE_OTHER): Payer: Self-pay

## 2018-05-16 VITALS — BP 172/81 | HR 64 | Resp 16 | Ht 69.0 in | Wt 215.0 lb

## 2018-05-16 DIAGNOSIS — I83029 Varicose veins of left lower extremity with ulcer of unspecified site: Secondary | ICD-10-CM | POA: Diagnosis not present

## 2018-05-16 DIAGNOSIS — L97929 Non-pressure chronic ulcer of unspecified part of left lower leg with unspecified severity: Secondary | ICD-10-CM | POA: Diagnosis not present

## 2018-05-16 NOTE — Progress Notes (Signed)
History of Present Illness  There is no documented history at this time  Assessments & Plan   There are no diagnoses linked to this encounter.    Additional instructions  Subjective:  Patient presents with venous ulcer of the Left lower extremity.    Procedure:  3 layer unna wrap was placed Left lower extremity.   Plan:   Follow up in one week.  

## 2018-05-19 ENCOUNTER — Encounter (INDEPENDENT_AMBULATORY_CARE_PROVIDER_SITE_OTHER): Payer: Self-pay | Admitting: Nurse Practitioner

## 2018-05-23 ENCOUNTER — Ambulatory Visit (INDEPENDENT_AMBULATORY_CARE_PROVIDER_SITE_OTHER): Payer: Medicare PPO | Admitting: Nurse Practitioner

## 2018-05-23 ENCOUNTER — Encounter (INDEPENDENT_AMBULATORY_CARE_PROVIDER_SITE_OTHER): Payer: Self-pay

## 2018-05-23 ENCOUNTER — Other Ambulatory Visit: Payer: Self-pay

## 2018-05-23 VITALS — BP 189/76 | HR 76 | Resp 16 | Ht 69.0 in | Wt 213.0 lb

## 2018-05-23 DIAGNOSIS — L97929 Non-pressure chronic ulcer of unspecified part of left lower leg with unspecified severity: Secondary | ICD-10-CM | POA: Diagnosis not present

## 2018-05-23 DIAGNOSIS — I83029 Varicose veins of left lower extremity with ulcer of unspecified site: Secondary | ICD-10-CM | POA: Diagnosis not present

## 2018-05-23 NOTE — Progress Notes (Signed)
History of Present Illness  There is no documented history at this time  Assessments & Plan   There are no diagnoses linked to this encounter.    Additional instructions  Subjective:  Patient presents with venous ulcer of the Left lower extremity.    Procedure:  3 layer unna wrap was placed Left lower extremity.   Plan:   Follow up in one week.  

## 2018-05-30 ENCOUNTER — Other Ambulatory Visit: Payer: Self-pay

## 2018-05-30 ENCOUNTER — Encounter (INDEPENDENT_AMBULATORY_CARE_PROVIDER_SITE_OTHER): Payer: Self-pay | Admitting: Nurse Practitioner

## 2018-05-30 ENCOUNTER — Ambulatory Visit (INDEPENDENT_AMBULATORY_CARE_PROVIDER_SITE_OTHER): Payer: Medicare PPO | Admitting: Nurse Practitioner

## 2018-05-30 VITALS — BP 176/77 | HR 68 | Resp 16 | Wt 209.6 lb

## 2018-05-30 DIAGNOSIS — I10 Essential (primary) hypertension: Secondary | ICD-10-CM | POA: Diagnosis not present

## 2018-05-30 DIAGNOSIS — J45909 Unspecified asthma, uncomplicated: Secondary | ICD-10-CM | POA: Diagnosis not present

## 2018-05-30 DIAGNOSIS — I83022 Varicose veins of left lower extremity with ulcer of calf: Secondary | ICD-10-CM

## 2018-05-30 DIAGNOSIS — L97229 Non-pressure chronic ulcer of left calf with unspecified severity: Secondary | ICD-10-CM | POA: Diagnosis not present

## 2018-05-30 DIAGNOSIS — I83029 Varicose veins of left lower extremity with ulcer of unspecified site: Secondary | ICD-10-CM

## 2018-05-30 DIAGNOSIS — L97929 Non-pressure chronic ulcer of unspecified part of left lower leg with unspecified severity: Secondary | ICD-10-CM

## 2018-05-30 DIAGNOSIS — Z79899 Other long term (current) drug therapy: Secondary | ICD-10-CM

## 2018-05-30 MED ORDER — GABAPENTIN 300 MG PO CAPS: 300.0000 mg | ORAL_CAPSULE | Freq: Every day | ORAL | 0 refills | Status: DC

## 2018-05-30 NOTE — Progress Notes (Signed)
SUBJECTIVE:  Patient ID: Janet [name], female    DOB: [birth year], 68 y.o.   MRN: [id number] Chief Complaint  Patient presents with  . Follow-up    unna boot follow up    HPI  Janet [name] is a 68 y.o. female that presents today for evaluation of the ulceration on her lateral left calf.  The wound appears to be healing.  There are 2 separate areas.  The proximal wound is approximately the size of a dime in the distal wound is approximately the size of a quarter.  The patient endorses having burning and stinging sensations along with pain in the leg.  She states that it is on tolerable at some point and it keeps her from sleeping.  She also states some numbness of her foot as well.  She denies any fever, chills, nausea, vomiting or diarrhea.  She denies any chest pain or shortness of breath.  Past Medical History:  Diagnosis Date  . Asthma   . Cancer ([place])    kidney  . Hypertension     Past Surgical History:  Procedure Laterality Date  . ABDOMINAL HYSTERECTOMY      Social History   Socioeconomic History  . Marital status: Married    Spouse name: Not on file  . Number of children: Not on file  . Years of education: Not on file  . Highest education level: Not on file  Occupational History  . Not on file  Social Needs  . Financial resource strain: Not on file  . Food insecurity:    Worry: Not on file    Inability: Not on file  . Transportation needs:    Medical: Not on file    Non-medical: Not on file  Tobacco Use  . Smoking status: Never Smoker  . Smokeless tobacco: Never Used  Substance and Sexual Activity  . Alcohol use: No  . Drug use: No  . Sexual activity: Not on file  Lifestyle  . Physical activity:    Days per week: Not on file    Minutes per session: Not on file  . Stress: Not on file  Relationships  . Social connections:    Talks on phone: Not on file    Gets together: Not on file    Attends religious service: Not on file    Active member of club or  organization: Not on file    Attends meetings of clubs or organizations: Not on file    Relationship status: Not on file  . Intimate partner violence:    Fear of current or ex partner: Not on file    Emotionally abused: Not on file    Physically abused: Not on file    Forced sexual activity: Not on file  Other Topics Concern  . Not on file  Social History Narrative  . Not on file    Family History  Problem Relation Age of Onset  . Stroke Neg Hx     Allergies  Allergen Reactions  . Shellfish Allergy Anaphylaxis  . Penicillins Hives and Other (See Comments)    Has patient had a PCN reaction causing immediate rash, facial/tongue/throat swelling, SOB or lightheadedness with hypotension: no Has patient had a PCN reaction causing severe rash involving mucus membranes or skin necrosis: no Has patient had a PCN reaction that required hospitalization? no Has patient had a PCN reaction occurring within the last 10 years: no If all of the above answers are "NO", then may proceed with  Cephalosporin use.      Review of Systems   Review of Systems: Negative Unless Checked Constitutional: [] Weight loss  [] Fever  [] Chills Cardiac: [] Chest pain   []  Atrial Fibrillation  [] Palpitations   [] Shortness of breath when laying flat   [] Shortness of breath with exertion. [] Shortness of breath at rest Vascular:  [] Pain in legs with walking   [] Pain in legs with standing [] Pain in legs when laying flat   [] Claudication    [] Pain in feet when laying flat    [] History of DVT   [] Phlebitis   [x] Swelling in legs   [x] Varicose veins   [x] Non-healing ulcers Pulmonary:   [] Uses home oxygen   [] Productive cough   [] Hemoptysis   [] Wheeze  [] COPD   [x] Asthma Neurologic:  [] Dizziness   [] Seizures  [] Blackouts [] History of stroke   [] History of TIA  [] Aphasia   [] Temporary Blindness   [] Weakness or numbness in arm   [] Weakness or numbness in leg Musculoskeletal:   [] Joint swelling   [] Joint pain   [] Low back pain   []  History of Knee Replacement [] Arthritis [] back Surgeries  []  Spinal Stenosis    Hematologic:  [] Easy bruising  [] Easy bleeding   [] Hypercoagulable state   [] Anemic Gastrointestinal:  [] Diarrhea   [] Vomiting  [] Gastroesophageal reflux/heartburn   [] Difficulty swallowing. [] Abdominal pain Genitourinary:  [] Chronic kidney disease   [] Difficult urination  [] Anuric   [] Blood in urine [] Frequent urination  [] Burning with urination   [] Hematuria Skin:  [] Rashes   [] Ulcers [] Wounds Psychological:  [] History of anxiety   []  History of major depression  []  Memory Difficulties      OBJECTIVE:   Physical Exam  BP (!) 176/77 (BP Location: Right Arm)   Pulse 68   Resp 16   Wt 209 lb 9.6 oz (95.1 kg)   BMI 30.95 kg/m   Gen: WD/WN, NAD Head: [place]/AT, No temporalis wasting.  Ear/Nose/Throat: Hearing grossly intact, nares w/o erythema or drainage Eyes: PER, EOMI, sclera nonicteric.  Neck: Supple, no masses.  No JVD.  Pulmonary:  Good air movement, no use of accessory muscles.  Cardiac: RRR Vascular:  Two wounds on lateral left calf Vessel Right Left  Radial Palpable Palpable  Dorsalis Pedis Palpable Palpable  Posterior Tibial Palpable Palpable   Gastrointestinal: soft, non-distended. No guarding/no peritoneal signs.  Musculoskeletal: M/S 5/5 throughout.  No deformity or atrophy.  Neurologic: Pain and light touch intact in extremities.  Symmetrical.  Speech is fluent. Motor exam as listed above. Psychiatric: Judgment intact, Mood & affect appropriate for pt's clinical situation. Dermatologic:  Stasis dermatitis left lower extremity. No changes consistent with cellulitis. Lymph : No Cervical lymphadenopathy, no lichenification or skin changes of chronic lymphedema.       ASSESSMENT AND PLAN:  1. Varicose veins with ulcer, left ([place]) Patient continues to have an ulceration on her left lower extremity.  Based on the size I anticipate it may take 8-12 more weeks of Unna wraps.  This was also  communicated to the patient.  The patient will follow-up in the office weekly for Unna wrap changes and we will evaluate again in 4 weeks.  Based on the patient's description of pain I have given her tramadol as well as some Neurontin at bedtime to see if it will help the burning and pain sensation that she has complaints about. - traMADol (ULTRAM) 50 MG tablet; Take by mouth every 6 (six) hours as needed. - gabapentin (NEURONTIN) 300 MG capsule; Take 1 capsule (300 mg total) by mouth  at bedtime.  Dispense: 30 capsule; Refill: 0  2. Essential hypertension Continue antihypertensive medications as already ordered, these medications have been reviewed and there are no changes at this time.   3. Asthma in adult without complication, unspecified asthma severity, unspecified whether persistent Continue pulmonary medications and aerosols as already ordered, these medications have been reviewed and there are no changes at this time.    Current Outpatient Medications on File Prior to Visit  Medication Sig Dispense Refill  . albuterol (PROVENTIL HFA;VENTOLIN HFA) 108 (90 BASE) MCG/ACT inhaler Inhale 2 puffs into the lungs every 6 (six) hours as needed for wheezing or shortness of breath. 1 Inhaler 0  . aspirin EC 81 MG tablet Take 81 mg by mouth daily.    . Butalbital-APAP-Caffeine 50-300-40 MG CAPS take 1 to 2 capsules by mouth every 6 hours if needed for headache    . diphenoxylate-atropine (LOMOTIL) 2.5-0.025 MG tablet Take 1 tablet by mouth 4 (four) times daily as needed for diarrhea or loose stools. 20 tablet 0  . Elastic Bandages & Supports (EMS ANTI-EMBOLISM STOCKINGS) MISC Apply in the morning and remove at night.    . famotidine (PEPCID) 20 MG tablet Take by mouth.    Marland Kitchen lisinopril (PRINIVIL,ZESTRIL) 40 MG tablet Take 1 tablet by mouth daily.  0  . meclizine (ANTIVERT) 12.5 MG tablet Take 1 tablet (12.5 mg total) by mouth 3 (three) times daily as needed for dizziness or nausea. 30 tablet 0  .  meclizine (ANTIVERT) 12.5 MG tablet Take 1 tablet (12.5 mg total) by mouth 3 (three) times daily as needed for dizziness. 30 tablet 0  . potassium chloride SA (K-DUR,KLOR-CON) 20 MEQ tablet Take 20 mEq by mouth daily.     . traMADol (ULTRAM) 50 MG tablet Take by mouth every 6 (six) hours as needed.    . doxycycline (VIBRA-TABS) 100 MG tablet Take 1 tablet (100 mg total) by mouth 2 (two) times daily. (Patient not taking: Reported on 05/23/2018) 20 tablet 0  . silver sulfADIAZINE (SILVADENE) 1 % cream Apply 1 application topically 2 (two) times daily. (Patient not taking: Reported on 05/23/2018) 50 g 0  . triamcinolone cream (KENALOG) 0.1 % Apply 1 application topically 2 (two) times daily. As needed x 10 days max. (Patient not taking: Reported on 05/23/2018) 30 g 0   No current facility-administered medications on file prior to visit.     There are no Patient Instructions on file for this visit. Return in about 4 weeks (around 06/27/2018) for Unna Check.   Kris Hartmann, NP  This note was completed with Sales executive.  Any errors are purely unintentional.

## 2018-06-06 ENCOUNTER — Other Ambulatory Visit: Payer: Self-pay

## 2018-06-06 ENCOUNTER — Encounter (INDEPENDENT_AMBULATORY_CARE_PROVIDER_SITE_OTHER): Payer: Self-pay

## 2018-06-06 ENCOUNTER — Ambulatory Visit (INDEPENDENT_AMBULATORY_CARE_PROVIDER_SITE_OTHER): Payer: Medicare PPO | Admitting: Nurse Practitioner

## 2018-06-06 VITALS — BP 160/79 | HR 76 | Resp 12 | Ht 69.0 in | Wt 208.0 lb

## 2018-06-06 DIAGNOSIS — I83029 Varicose veins of left lower extremity with ulcer of unspecified site: Secondary | ICD-10-CM

## 2018-06-06 DIAGNOSIS — L97929 Non-pressure chronic ulcer of unspecified part of left lower leg with unspecified severity: Secondary | ICD-10-CM | POA: Diagnosis not present

## 2018-06-06 NOTE — Progress Notes (Signed)
History of Present Illness  There is no documented history at this time  Assessments & Plan   There are no diagnoses linked to this encounter.    Additional instructions  Subjective:  Patient presents with venous ulcer of the Left lower extremity.    Procedure:  3 layer unna wrap was placed Left lower extremity.   Plan:   Follow up in one week.  

## 2018-06-13 ENCOUNTER — Ambulatory Visit (INDEPENDENT_AMBULATORY_CARE_PROVIDER_SITE_OTHER): Payer: Medicare PPO | Admitting: Nurse Practitioner

## 2018-06-13 ENCOUNTER — Other Ambulatory Visit: Payer: Self-pay

## 2018-06-13 ENCOUNTER — Encounter (INDEPENDENT_AMBULATORY_CARE_PROVIDER_SITE_OTHER): Payer: Self-pay

## 2018-06-13 VITALS — BP 165/76 | HR 73 | Resp 16 | Wt 208.0 lb

## 2018-06-13 DIAGNOSIS — L97929 Non-pressure chronic ulcer of unspecified part of left lower leg with unspecified severity: Secondary | ICD-10-CM

## 2018-06-13 DIAGNOSIS — I83029 Varicose veins of left lower extremity with ulcer of unspecified site: Secondary | ICD-10-CM | POA: Diagnosis not present

## 2018-06-13 NOTE — Progress Notes (Signed)
History of Present Illness  There is no documented history at this time  Assessments & Plan   There are no diagnoses linked to this encounter.    Additional instructions  Subjective:  Patient presents with venous ulcer of the Left lower extremity.    Procedure:  3 layer unna wrap was placed Left lower extremity.   Plan:   Follow up in one week.  

## 2018-06-20 ENCOUNTER — Ambulatory Visit (INDEPENDENT_AMBULATORY_CARE_PROVIDER_SITE_OTHER): Payer: Non-veteran care | Admitting: Nurse Practitioner

## 2018-06-23 ENCOUNTER — Encounter (INDEPENDENT_AMBULATORY_CARE_PROVIDER_SITE_OTHER): Payer: Self-pay | Admitting: Nurse Practitioner

## 2018-06-23 ENCOUNTER — Ambulatory Visit (INDEPENDENT_AMBULATORY_CARE_PROVIDER_SITE_OTHER): Payer: Medicare PPO | Admitting: Nurse Practitioner

## 2018-06-23 ENCOUNTER — Other Ambulatory Visit: Payer: Self-pay

## 2018-06-23 VITALS — BP 164/81 | HR 74 | Resp 16 | Wt 209.0 lb

## 2018-06-23 DIAGNOSIS — L97929 Non-pressure chronic ulcer of unspecified part of left lower leg with unspecified severity: Secondary | ICD-10-CM

## 2018-06-23 DIAGNOSIS — R202 Paresthesia of skin: Secondary | ICD-10-CM

## 2018-06-23 DIAGNOSIS — Z79899 Other long term (current) drug therapy: Secondary | ICD-10-CM

## 2018-06-23 DIAGNOSIS — I10 Essential (primary) hypertension: Secondary | ICD-10-CM | POA: Diagnosis not present

## 2018-06-23 DIAGNOSIS — I83029 Varicose veins of left lower extremity with ulcer of unspecified site: Secondary | ICD-10-CM | POA: Diagnosis not present

## 2018-06-23 DIAGNOSIS — R200 Anesthesia of skin: Secondary | ICD-10-CM | POA: Diagnosis not present

## 2018-06-23 NOTE — Progress Notes (Signed)
SUBJECTIVE:  Patient ID: Janet [name], female    DOB: April 18, 1950, 68 y.o.   MRN: [id number] Chief Complaint  Patient presents with  . Follow-up    unna check    HPI  Janet [name] is a 68 y.o. female Patient is seen for follow up evaluation of leg pain and swelling associated with venous ulceration. The patient was recently seen here and started on Unna boot therapy.  The swelling abruptly became much worse bilaterally and is associated with pain and discoloration. The pain and swelling worsens with prolonged dependency and improves with elevation.  The patient notes that in the morning the legs are better but the leg symptoms worsened throughout the course of the day. The patient has also noted a progressive worsening of the discoloration in the ankle and shin area.   The patient notes that an ulcer has developed acutely without specific trauma and since it occurred it has been very slow to heal.  The wound is also very painful.   The patient states that they have been elevating as much as possible. The patient denies any recent changes in medications.  The patient denies a history of DVT or PE. There is no prior history of phlebitis. There is no history of primary lymphedema.  No SOB or increased cough.  No sputum production.  No recent episodes of CHF exacerbation.  The patient is also complaining of numbness and tingling of her left lower extremity today.  However previous peripheral arterial disease studies reveal normal blood flow.  The patient stated that this just happened suddenly.  She also has strong palpable pulses. Past Medical History:  Diagnosis Date  . Asthma   . Cancer ([place])    kidney  . Hypertension     Past Surgical History:  Procedure Laterality Date  . ABDOMINAL HYSTERECTOMY      Social History   Socioeconomic History  . Marital status: Married    Spouse name: Not on file  . Number of children: Not on file  . Years of education: Not on file  . Highest  education level: Not on file  Occupational History  . Not on file  Social Needs  . Financial resource strain: Not on file  . Food insecurity:    Worry: Not on file    Inability: Not on file  . Transportation needs:    Medical: Not on file    Non-medical: Not on file  Tobacco Use  . Smoking status: Never Smoker  . Smokeless tobacco: Never Used  Substance and Sexual Activity  . Alcohol use: No  . Drug use: No  . Sexual activity: Not on file  Lifestyle  . Physical activity:    Days per week: Not on file    Minutes per session: Not on file  . Stress: Not on file  Relationships  . Social connections:    Talks on phone: Not on file    Gets together: Not on file    Attends religious service: Not on file    Active member of club or organization: Not on file    Attends meetings of clubs or organizations: Not on file    Relationship status: Not on file  . Intimate partner violence:    Fear of current or ex partner: Not on file    Emotionally abused: Not on file    Physically abused: Not on file    Forced sexual activity: Not on file  Other Topics Concern  . Not  on file  Social History Narrative  . Not on file    Family History  Problem Relation Age of Onset  . Stroke Neg Hx     Allergies  Allergen Reactions  . Shellfish Allergy Anaphylaxis  . Penicillins Hives and Other (See Comments)    Has patient had a PCN reaction causing immediate rash, facial/tongue/throat swelling, SOB or lightheadedness with hypotension: no Has patient had a PCN reaction causing severe rash involving mucus membranes or skin necrosis: no Has patient had a PCN reaction that required hospitalization? no Has patient had a PCN reaction occurring within the last 10 years: no If all of the above answers are "NO", then may proceed with Cephalosporin use.      Review of Systems   Review of Systems: Negative Unless Checked Constitutional: [] Weight loss  [] Fever  [] Chills Cardiac: [] Chest pain   []   Atrial Fibrillation  [] Palpitations   [] Shortness of breath when laying flat   [] Shortness of breath with exertion. [] Shortness of breath at rest Vascular:  [] Pain in legs with walking   [] Pain in legs with standing [] Pain in legs when laying flat   [] Claudication    [] Pain in feet when laying flat    [] History of DVT   [] Phlebitis   [x] Swelling in legs   [x] Varicose veins   [x] Non-healing ulcers Pulmonary:   [] Uses home oxygen   [] Productive cough   [] Hemoptysis   [] Wheeze  [] COPD   [x] Asthma Neurologic:  [] Dizziness   [] Seizures  [] Blackouts [] History of stroke   [] History of TIA  [] Aphasia   [] Temporary Blindness   [] Weakness or numbness in arm   [x] Weakness or numbness in leg Musculoskeletal:   [] Joint swelling   [] Joint pain   [] Low back pain  []  History of Knee Replacement [] Arthritis [] back Surgeries  []  Spinal Stenosis    Hematologic:  [] Easy bruising  [] Easy bleeding   [] Hypercoagulable state   [] Anemic Gastrointestinal:  [] Diarrhea   [] Vomiting  [] Gastroesophageal reflux/heartburn   [] Difficulty swallowing. [] Abdominal pain Genitourinary:  [] Chronic kidney disease   [] Difficult urination  [] Anuric   [] Blood in urine [] Frequent urination  [] Burning with urination   [] Hematuria Skin:  [x] Rashes   [x] Ulcers [] Wounds Psychological:  [] History of anxiety   []  History of major depression  []  Memory Difficulties      OBJECTIVE:   Physical Exam  BP (!) 164/81 (BP Location: Right Arm)   Pulse 74   Resp 16   Wt 209 lb (94.8 kg)   BMI 30.86 kg/m   Gen: WD/WN, NAD Head: [place]/AT, No temporalis wasting.  Ear/Nose/Throat: Hearing grossly intact, nares w/o erythema or drainage Eyes: PER, EOMI, sclera nonicteric.  Neck: Supple, no masses.  No JVD.  Pulmonary:  Good air movement, no use of accessory muscles.  Cardiac: RRR Vascular:  Vessel Right Left  Radial Palpable Palpable  Dorsalis Pedis Palpable Palpable  Posterior Tibial Palpable Palpable   Gastrointestinal: soft, non-distended. No  guarding/no peritoneal signs.  Musculoskeletal: M/S 5/5 throughout.  No deformity or atrophy.  Neurologic: Pain and light touch intact in extremities.  Symmetrical.  Speech is fluent. Motor exam as listed above. Psychiatric: Judgment intact, Mood & affect appropriate for pt's clinical situation. Dermatologic: Left lower extremity stasis dermatitis with ulceration to lateral calf No changes consistent with cellulitis. Lymph : No Cervical lymphadenopathy, no lichenification or skin changes of chronic lymphedema.       ASSESSMENT AND PLAN:  1. Varicose veins with ulcer, left ([place]) No surgery or intervention at this  point in time.    I have had a long discussion with the patient regarding venous insufficiency and why it  causes symptoms, specifically venous ulceration . I have discussed with the patient the chronic skin changes that accompany venous insufficiency and the long term sequela such as infection and recurring  ulceration.  Patient will be placed in [employer] which will be changed weekly drainage permitting.  In addition, behavioral modification including several periods of elevation of the lower extremities during the day will be continued. Achieving a position with the ankles at heart level was stressed to the patient  The patient is instructed to begin routine exercise, especially walking on a daily basis  Patient should undergo duplex ultrasound of the venous system to ensure that DVT or reflux is not present.  Patient will have her own wraps changed weekly in office as a nurse visit for the next 4 weeks.  We will reevaluate her wound and ulceration in weeks.   2. Essential hypertension Continue antihypertensive medications as already ordered, these medications have been reviewed and there are no changes at this time.   3. Numbness and tingling of left lower extremity Numbness and tingling is likely not due to vascular causes based on previous studies as well as physical  assessment.  The patient denies being diabetic however she states that she was borderline several years ago and has not checked in some time.  Advised the patient to contact her PCP for further evaluation of possible causes of left leg numbness.   Current Outpatient Medications on File Prior to Visit  Medication Sig Dispense Refill  . albuterol (PROVENTIL HFA;VENTOLIN HFA) 108 (90 BASE) MCG/ACT inhaler Inhale 2 puffs into the lungs every 6 (six) hours as needed for wheezing or shortness of breath. 1 Inhaler 0  . aspirin EC 81 MG tablet Take 81 mg by mouth daily.    . Butalbital-APAP-Caffeine 50-300-40 MG CAPS take 1 to 2 capsules by mouth every 6 hours if needed for headache    . diphenoxylate-atropine (LOMOTIL) 2.5-0.025 MG tablet Take 1 tablet by mouth 4 (four) times daily as needed for diarrhea or loose stools. 20 tablet 0  . Elastic Bandages & Supports (EMS ANTI-EMBOLISM STOCKINGS) MISC Apply in the morning and remove at night.    . famotidine (PEPCID) 20 MG tablet Take by mouth.    . gabapentin (NEURONTIN) 300 MG capsule Take 1 capsule (300 mg total) by mouth at bedtime. 30 capsule 0  . lisinopril (PRINIVIL,ZESTRIL) 40 MG tablet Take 1 tablet by mouth daily.  0  . meclizine (ANTIVERT) 12.5 MG tablet Take 1 tablet (12.5 mg total) by mouth 3 (three) times daily as needed for dizziness or nausea. 30 tablet 0  . meclizine (ANTIVERT) 12.5 MG tablet Take 1 tablet (12.5 mg total) by mouth 3 (three) times daily as needed for dizziness. 30 tablet 0  . potassium chloride SA (K-DUR,KLOR-CON) 20 MEQ tablet Take 20 mEq by mouth daily.     . traMADol (ULTRAM) 50 MG tablet Take by mouth every 6 (six) hours as needed.    . doxycycline (VIBRA-TABS) 100 MG tablet Take 1 tablet (100 mg total) by mouth 2 (two) times daily. (Patient not taking: Reported on 05/23/2018) 20 tablet 0  . silver sulfADIAZINE (SILVADENE) 1 % cream Apply 1 application topically 2 (two) times daily. (Patient not taking: Reported on 05/23/2018)  50 g 0  . triamcinolone cream (KENALOG) 0.1 % Apply 1 application topically 2 (two) times daily. As needed  x 10 days max. (Patient not taking: Reported on 05/23/2018) 30 g 0   No current facility-administered medications on file prior to visit.     There are no Patient Instructions on file for this visit. Return in about 4 weeks (around 07/21/2018) for Unna Check.   Kris Hartmann, NP  This note was completed with Sales executive.  Any errors are purely unintentional.

## 2018-06-27 ENCOUNTER — Ambulatory Visit (INDEPENDENT_AMBULATORY_CARE_PROVIDER_SITE_OTHER): Payer: Medicare PPO | Admitting: Nurse Practitioner

## 2018-06-27 ENCOUNTER — Other Ambulatory Visit: Payer: Self-pay

## 2018-06-27 ENCOUNTER — Encounter (INDEPENDENT_AMBULATORY_CARE_PROVIDER_SITE_OTHER): Payer: Self-pay

## 2018-06-27 VITALS — BP 145/74 | HR 80 | Resp 16 | Wt 210.0 lb

## 2018-06-27 DIAGNOSIS — I83029 Varicose veins of left lower extremity with ulcer of unspecified site: Secondary | ICD-10-CM

## 2018-06-27 DIAGNOSIS — L97929 Non-pressure chronic ulcer of unspecified part of left lower leg with unspecified severity: Secondary | ICD-10-CM

## 2018-06-27 NOTE — Progress Notes (Signed)
History of Present Illness  There is no documented history at this time  Assessments & Plan   There are no diagnoses linked to this encounter.    Additional instructions  Subjective:  Patient presents with venous ulcer of the Left lower extremity.    Procedure:  3 layer unna wrap was placed Left lower extremity.   Plan:   Follow up in one week.  

## 2018-06-30 ENCOUNTER — Encounter (INDEPENDENT_AMBULATORY_CARE_PROVIDER_SITE_OTHER): Payer: Non-veteran care

## 2018-07-04 ENCOUNTER — Encounter (INDEPENDENT_AMBULATORY_CARE_PROVIDER_SITE_OTHER): Payer: Self-pay

## 2018-07-04 ENCOUNTER — Ambulatory Visit (INDEPENDENT_AMBULATORY_CARE_PROVIDER_SITE_OTHER): Payer: Medicare PPO | Admitting: Nurse Practitioner

## 2018-07-04 ENCOUNTER — Other Ambulatory Visit: Payer: Self-pay

## 2018-07-04 VITALS — BP 132/82 | HR 72 | Resp 16 | Wt 211.0 lb

## 2018-07-04 DIAGNOSIS — I83029 Varicose veins of left lower extremity with ulcer of unspecified site: Secondary | ICD-10-CM | POA: Diagnosis not present

## 2018-07-04 DIAGNOSIS — L97929 Non-pressure chronic ulcer of unspecified part of left lower leg with unspecified severity: Secondary | ICD-10-CM

## 2018-07-04 NOTE — Progress Notes (Signed)
History of Present Illness  There is no documented history at this time  Assessments & Plan   There are no diagnoses linked to this encounter.    Additional instructions  Subjective:  Patient presents with venous ulcer of the Left lower extremity.    Procedure:  3 layer unna wrap was placed Left lower extremity.   Plan:   Follow up in one week.  

## 2018-07-07 ENCOUNTER — Encounter (INDEPENDENT_AMBULATORY_CARE_PROVIDER_SITE_OTHER): Payer: Non-veteran care

## 2018-07-11 ENCOUNTER — Encounter (INDEPENDENT_AMBULATORY_CARE_PROVIDER_SITE_OTHER): Payer: Self-pay | Admitting: Nurse Practitioner

## 2018-07-11 ENCOUNTER — Other Ambulatory Visit: Payer: Self-pay

## 2018-07-11 ENCOUNTER — Ambulatory Visit (INDEPENDENT_AMBULATORY_CARE_PROVIDER_SITE_OTHER): Payer: Medicare PPO | Admitting: Nurse Practitioner

## 2018-07-11 VITALS — BP 159/77 | HR 74 | Resp 16 | Wt 213.0 lb

## 2018-07-11 DIAGNOSIS — J45909 Unspecified asthma, uncomplicated: Secondary | ICD-10-CM

## 2018-07-11 DIAGNOSIS — I83029 Varicose veins of left lower extremity with ulcer of unspecified site: Secondary | ICD-10-CM | POA: Diagnosis not present

## 2018-07-11 DIAGNOSIS — I10 Essential (primary) hypertension: Secondary | ICD-10-CM | POA: Diagnosis not present

## 2018-07-11 DIAGNOSIS — Z79899 Other long term (current) drug therapy: Secondary | ICD-10-CM

## 2018-07-11 DIAGNOSIS — L97929 Non-pressure chronic ulcer of unspecified part of left lower leg with unspecified severity: Secondary | ICD-10-CM

## 2018-07-11 NOTE — Progress Notes (Signed)
SUBJECTIVE:  Patient ID: Janet [name], female    DOB: 1950/03/14, 68 y.o.   MRN: [id number] Chief Complaint  Patient presents with  . Follow-up    unna check    HPI  Janet [name] is a 68 y.o. female that comes for evaluation of her left lower extremity ulceration.  Today the wound appears much improved.  She has just a small area of ulceration the patient also states that the pain is greatly reduced.  She endorses elevation as well as exercise on a regular basis.  She denies any fevers, chills, nausea, vomiting or diarrhea.  She denies any chest pain or shortness of breath.  Past Medical History:  Diagnosis Date  . Asthma   . Cancer ([place])    kidney  . Hypertension     Past Surgical History:  Procedure Laterality Date  . ABDOMINAL HYSTERECTOMY      Social History   Socioeconomic History  . Marital status: Married    Spouse name: Not on file  . Number of children: Not on file  . Years of education: Not on file  . Highest education level: Not on file  Occupational History  . Not on file  Social Needs  . Financial resource strain: Not on file  . Food insecurity    Worry: Not on file    Inability: Not on file  . Transportation needs    Medical: Not on file    Non-medical: Not on file  Tobacco Use  . Smoking status: Never Smoker  . Smokeless tobacco: Never Used  Substance and Sexual Activity  . Alcohol use: No  . Drug use: No  . Sexual activity: Not on file  Lifestyle  . Physical activity    Days per week: Not on file    Minutes per session: Not on file  . Stress: Not on file  Relationships  . Social Herbalist on phone: Not on file    Gets together: Not on file    Attends religious service: Not on file    Active member of club or organization: Not on file    Attends meetings of clubs or organizations: Not on file    Relationship status: Not on file  . Intimate partner violence    Fear of current or ex partner: Not on file    Emotionally abused:  Not on file    Physically abused: Not on file    Forced sexual activity: Not on file  Other Topics Concern  . Not on file  Social History Narrative  . Not on file    Family History  Problem Relation Age of Onset  . Stroke Neg Hx     Allergies  Allergen Reactions  . Shellfish Allergy Anaphylaxis  . Penicillins Hives and Other (See Comments)    Has patient had a PCN reaction causing immediate rash, facial/tongue/throat swelling, SOB or lightheadedness with hypotension: no Has patient had a PCN reaction causing severe rash involving mucus membranes or skin necrosis: no Has patient had a PCN reaction that required hospitalization? no Has patient had a PCN reaction occurring within the last 10 years: no If all of the above answers are "NO", then may proceed with Cephalosporin use.      Review of Systems   Review of Systems: Negative Unless Checked Constitutional: [] Weight loss  [] Fever  [] Chills Cardiac: [] Chest pain   []  Atrial Fibrillation  [] Palpitations   [] Shortness of breath when laying flat   []   Shortness of breath with exertion. [] Shortness of breath at rest Vascular:  [] Pain in legs with walking   [] Pain in legs with standing [] Pain in legs when laying flat   [] Claudication    [] Pain in feet when laying flat    [] History of DVT   [] Phlebitis   [x] Swelling in legs   [] Varicose veins   [] Non-healing ulcers Pulmonary:   [] Uses home oxygen   [] Productive cough   [] Hemoptysis   [] Wheeze  [] COPD   [x] Asthma Neurologic:  [] Dizziness   [] Seizures  [] Blackouts [] History of stroke   [] History of TIA  [] Aphasia   [] Temporary Blindness   [] Weakness or numbness in arm   [x] Weakness or numbness in leg Musculoskeletal:   [] Joint swelling   [] Joint pain   [] Low back pain  []  History of Knee Replacement [] Arthritis [] back Surgeries  []  Spinal Stenosis    Hematologic:  [] Easy bruising  [] Easy bleeding   [] Hypercoagulable state   [] Anemic Gastrointestinal:  [] Diarrhea   [] Vomiting   [] Gastroesophageal reflux/heartburn   [] Difficulty swallowing. [] Abdominal pain Genitourinary:  [] Chronic kidney disease   [] Difficult urination  [] Anuric   [] Blood in urine [] Frequent urination  [] Burning with urination   [] Hematuria Skin:  [] Rashes   [x] Ulcers [] Wounds Psychological:  [] History of anxiety   []  History of major depression  []  Memory Difficulties      OBJECTIVE:   Physical Exam  BP (!) 159/77 (BP Location: Right Arm)   Pulse 74   Resp 16   Wt 213 lb (96.6 kg)   BMI 31.45 kg/m   Gen: WD/WN, NAD Head: [place]/AT, No temporalis wasting.  Ear/Nose/Throat: Hearing grossly intact, nares w/o erythema or drainage Eyes: PER, EOMI, sclera nonicteric.  Neck: Supple, no masses.  No JVD.  Pulmonary:  Good air movement, no use of accessory muscles.  Cardiac: RRR Vascular:  Vessel Right Left  Radial Palpable Palpable  Dorsalis Pedis Palpable Palpable  Posterior Tibial Palpable Palpable   Gastrointestinal: soft, non-distended. No guarding/no peritoneal signs.  Musculoskeletal: M/S 5/5 throughout.  No deformity or atrophy.  Neurologic: Pain and light touch intact in extremities.  Symmetrical.  Speech is fluent. Motor exam as listed above. Psychiatric: Judgment intact, Mood & affect appropriate for pt's clinical situation. Dermatologic: Bilateral stasis dermatitis.  Small ulceration on left lateral calf.  No changes consistent with cellulitis. Lymph : No Cervical lymphadenopathy, no lichenification or skin changes of chronic lymphedema.       ASSESSMENT AND PLAN:  1. Varicose veins with ulcer, left ([place]) No surgery or intervention at this point in time.    I have had a long discussion with the patient regarding venous insufficiency and why it  causes symptoms, specifically venous ulceration . I have discussed with the patient the chronic skin changes that accompany venous insufficiency and the long term sequela such as infection and recurring  ulceration.  Patient will be placed  in [employer] which will be changed weekly drainage permitting.  In addition, behavioral modification including several periods of elevation of the lower extremities during the day will be continued. Achieving a position with the ankles at heart level was stressed to the patient  The patient is instructed to begin routine exercise, especially walking on a daily basis  Patient is very nearly healed, if her wound is completely healed by next visit we will remove her from wraps.  2. Essential hypertension Continue antihypertensive medications as already ordered, these medications have been reviewed and there are no changes at this time.  3. Asthma in adult without complication, unspecified asthma severity, unspecified whether persistent Continue pulmonary medications and aerosols as already ordered, these medications have been reviewed and there are no changes at this time.     Current Outpatient Medications on File Prior to Visit  Medication Sig Dispense Refill  . albuterol (PROVENTIL HFA;VENTOLIN HFA) 108 (90 BASE) MCG/ACT inhaler Inhale 2 puffs into the lungs every 6 (six) hours as needed for wheezing or shortness of breath. 1 Inhaler 0  . aspirin EC 81 MG tablet Take 81 mg by mouth daily.    . Butalbital-APAP-Caffeine 50-300-40 MG CAPS take 1 to 2 capsules by mouth every 6 hours if needed for headache    . diphenoxylate-atropine (LOMOTIL) 2.5-0.025 MG tablet Take 1 tablet by mouth 4 (four) times daily as needed for diarrhea or loose stools. 20 tablet 0  . Elastic Bandages & Supports (EMS ANTI-EMBOLISM STOCKINGS) MISC Apply in the morning and remove at night.    . famotidine (PEPCID) 20 MG tablet Take by mouth.    . gabapentin (NEURONTIN) 300 MG capsule Take 1 capsule (300 mg total) by mouth at bedtime. 30 capsule 0  . lisinopril (PRINIVIL,ZESTRIL) 40 MG tablet Take 1 tablet by mouth daily.  0  . meclizine (ANTIVERT) 12.5 MG tablet Take 1 tablet (12.5 mg total) by mouth 3 (three) times  daily as needed for dizziness or nausea. 30 tablet 0  . meclizine (ANTIVERT) 12.5 MG tablet Take 1 tablet (12.5 mg total) by mouth 3 (three) times daily as needed for dizziness. 30 tablet 0  . potassium chloride SA (K-DUR,KLOR-CON) 20 MEQ tablet Take 20 mEq by mouth daily.     . silver sulfADIAZINE (SILVADENE) 1 % cream Apply 1 application topically 2 (two) times daily. 50 g 0  . doxycycline (VIBRA-TABS) 100 MG tablet Take 1 tablet (100 mg total) by mouth 2 (two) times daily. (Patient not taking: Reported on 05/23/2018) 20 tablet 0  . traMADol (ULTRAM) 50 MG tablet Take by mouth every 6 (six) hours as needed.    . triamcinolone cream (KENALOG) 0.1 % Apply 1 application topically 2 (two) times daily. As needed x 10 days max. (Patient not taking: Reported on 05/23/2018) 30 g 0   No current facility-administered medications on file prior to visit.     There are no Patient Instructions on file for this visit. Return in about 1 week (around 07/18/2018) for Unna wrap.   Kris Hartmann, NP  This note was completed with Sales executive.  Any errors are purely unintentional.

## 2018-07-14 ENCOUNTER — Ambulatory Visit (INDEPENDENT_AMBULATORY_CARE_PROVIDER_SITE_OTHER): Payer: Non-veteran care | Admitting: Nurse Practitioner

## 2018-07-17 ENCOUNTER — Encounter (INDEPENDENT_AMBULATORY_CARE_PROVIDER_SITE_OTHER): Payer: Non-veteran care

## 2018-07-21 ENCOUNTER — Ambulatory Visit (INDEPENDENT_AMBULATORY_CARE_PROVIDER_SITE_OTHER): Payer: Medicare Other | Admitting: Nurse Practitioner

## 2018-07-21 ENCOUNTER — Other Ambulatory Visit: Payer: Self-pay

## 2018-07-21 ENCOUNTER — Telehealth (INDEPENDENT_AMBULATORY_CARE_PROVIDER_SITE_OTHER): Payer: Self-pay

## 2018-07-21 ENCOUNTER — Encounter (INDEPENDENT_AMBULATORY_CARE_PROVIDER_SITE_OTHER): Payer: Self-pay

## 2018-07-21 VITALS — BP 136/72 | HR 67 | Resp 16 | Wt 211.0 lb

## 2018-07-21 DIAGNOSIS — I83029 Varicose veins of left lower extremity with ulcer of unspecified site: Secondary | ICD-10-CM | POA: Diagnosis not present

## 2018-07-21 DIAGNOSIS — L97929 Non-pressure chronic ulcer of unspecified part of left lower leg with unspecified severity: Secondary | ICD-10-CM | POA: Diagnosis not present

## 2018-07-21 NOTE — Progress Notes (Signed)
History of Present Illness  There is no documented history at this time  Assessments & Plan   There are no diagnoses linked to this encounter.    Additional instructions  Subjective:  Patient presents with venous ulcer of the Left lower extremity.    Procedure:  3 layer unna wrap was placed Left lower extremity.   Plan:   Follow up in one week.  

## 2018-07-21 NOTE — Telephone Encounter (Signed)
Patient called to reschedule unna boot appointment from Thursday and she will be coming in today for a nurse visit @1 :15 for unna boot change

## 2018-07-25 ENCOUNTER — Encounter (INDEPENDENT_AMBULATORY_CARE_PROVIDER_SITE_OTHER): Payer: Self-pay

## 2018-07-25 ENCOUNTER — Ambulatory Visit (INDEPENDENT_AMBULATORY_CARE_PROVIDER_SITE_OTHER): Payer: Medicare Other | Admitting: Nurse Practitioner

## 2018-07-25 ENCOUNTER — Other Ambulatory Visit: Payer: Self-pay

## 2018-07-25 VITALS — BP 131/76 | HR 74 | Resp 16 | Wt 212.0 lb

## 2018-07-25 DIAGNOSIS — L97929 Non-pressure chronic ulcer of unspecified part of left lower leg with unspecified severity: Secondary | ICD-10-CM

## 2018-07-25 DIAGNOSIS — I83029 Varicose veins of left lower extremity with ulcer of unspecified site: Secondary | ICD-10-CM

## 2018-07-25 NOTE — Progress Notes (Signed)
History of Present Illness  There is no documented history at this time  Assessments & Plan   There are no diagnoses linked to this encounter.    Additional instructions  Subjective:  Patient presents with venous ulcer of the Left lower extremity.    Procedure:  3 layer unna wrap was placed Left lower extremity.   Plan:   Follow up in one week.  

## 2018-07-28 ENCOUNTER — Encounter (INDEPENDENT_AMBULATORY_CARE_PROVIDER_SITE_OTHER): Payer: Self-pay | Admitting: Nurse Practitioner

## 2018-08-01 ENCOUNTER — Ambulatory Visit (INDEPENDENT_AMBULATORY_CARE_PROVIDER_SITE_OTHER): Payer: Medicare Other | Admitting: Nurse Practitioner

## 2018-08-01 ENCOUNTER — Encounter (INDEPENDENT_AMBULATORY_CARE_PROVIDER_SITE_OTHER): Payer: Self-pay | Admitting: Nurse Practitioner

## 2018-08-01 ENCOUNTER — Other Ambulatory Visit: Payer: Self-pay

## 2018-08-01 VITALS — BP 133/70 | HR 77 | Resp 16 | Wt 214.0 lb

## 2018-08-01 DIAGNOSIS — I10 Essential (primary) hypertension: Secondary | ICD-10-CM

## 2018-08-01 DIAGNOSIS — J45909 Unspecified asthma, uncomplicated: Secondary | ICD-10-CM | POA: Diagnosis not present

## 2018-08-01 DIAGNOSIS — L97929 Non-pressure chronic ulcer of unspecified part of left lower leg with unspecified severity: Secondary | ICD-10-CM | POA: Diagnosis not present

## 2018-08-01 DIAGNOSIS — I83029 Varicose veins of left lower extremity with ulcer of unspecified site: Secondary | ICD-10-CM

## 2018-08-01 DIAGNOSIS — Z79899 Other long term (current) drug therapy: Secondary | ICD-10-CM

## 2018-08-08 ENCOUNTER — Other Ambulatory Visit: Payer: Self-pay

## 2018-08-08 ENCOUNTER — Telehealth (INDEPENDENT_AMBULATORY_CARE_PROVIDER_SITE_OTHER): Payer: Self-pay | Admitting: Vascular Surgery

## 2018-08-08 ENCOUNTER — Encounter (INDEPENDENT_AMBULATORY_CARE_PROVIDER_SITE_OTHER): Payer: Self-pay | Admitting: Nurse Practitioner

## 2018-08-08 ENCOUNTER — Ambulatory Visit (INDEPENDENT_AMBULATORY_CARE_PROVIDER_SITE_OTHER): Payer: Medicare Other | Admitting: Vascular Surgery

## 2018-08-08 VITALS — BP 137/65 | HR 75 | Resp 12 | Ht 69.0 in | Wt 215.0 lb

## 2018-08-08 DIAGNOSIS — I83029 Varicose veins of left lower extremity with ulcer of unspecified site: Secondary | ICD-10-CM | POA: Diagnosis not present

## 2018-08-08 DIAGNOSIS — L97929 Non-pressure chronic ulcer of unspecified part of left lower leg with unspecified severity: Secondary | ICD-10-CM

## 2018-08-08 NOTE — Telephone Encounter (Signed)
Patient came into office today for Left leg unna change. Patient complaining of pain and redness in her right leg. Stating that this started Monday. I spoke with Mickel Baas since we have no doctors in the office and she advised me to send a message.  Patient is aware our office is closing early and that she will not hear back from [place] until Monday. I advised her if she felt that her leg got worse she could call the oncall doctor for advise. Patient verbalized understanding. AS, CMA

## 2018-08-08 NOTE — Progress Notes (Signed)
History of Present Illness  There is no documented history at this time  Assessments & Plan   There are no diagnoses linked to this encounter.    Additional instructions  Subjective:  Patient presents with venous ulcer of the Left lower extremity.    Procedure:  3 layer unna wrap was placed Left lower extremity.   Plan:   Follow up in one week.  

## 2018-08-08 NOTE — Progress Notes (Signed)
SUBJECTIVE:  Patient ID: Janet [name], female    DOB: 1950/04/06, 68 y.o.   MRN: [id number] Chief Complaint  Patient presents with  . Follow-up    unna check    HPI  Janet [name] is a 68 y.o. female that presents today for evaluation of her left leg ulceration.  It is continuing to grow smaller with weekly Unna wrap therapy.  Patient is tolerating her therapy well.  She denies any fever, chills, nausea, vomiting or diarrhea.  She denies any chest pain or shortness of breath.  Past Medical History:  Diagnosis Date  . Asthma   . Cancer ([place])    kidney  . Hypertension     Past Surgical History:  Procedure Laterality Date  . ABDOMINAL HYSTERECTOMY      Social History   Socioeconomic History  . Marital status: Married    Spouse name: Not on file  . Number of children: Not on file  . Years of education: Not on file  . Highest education level: Not on file  Occupational History  . Not on file  Social Needs  . Financial resource strain: Not on file  . Food insecurity    Worry: Not on file    Inability: Not on file  . Transportation needs    Medical: Not on file    Non-medical: Not on file  Tobacco Use  . Smoking status: Never Smoker  . Smokeless tobacco: Never Used  Substance and Sexual Activity  . Alcohol use: No  . Drug use: No  . Sexual activity: Not on file  Lifestyle  . Physical activity    Days per week: Not on file    Minutes per session: Not on file  . Stress: Not on file  Relationships  . Social Herbalist on phone: Not on file    Gets together: Not on file    Attends religious service: Not on file    Active member of club or organization: Not on file    Attends meetings of clubs or organizations: Not on file    Relationship status: Not on file  . Intimate partner violence    Fear of current or ex partner: Not on file    Emotionally abused: Not on file    Physically abused: Not on file    Forced sexual activity: Not on file  Other  Topics Concern  . Not on file  Social History Narrative  . Not on file    Family History  Problem Relation Age of Onset  . Stroke Neg Hx     Allergies  Allergen Reactions  . Shellfish Allergy Anaphylaxis  . Penicillins Hives and Other (See Comments)    Has patient had a PCN reaction causing immediate rash, facial/tongue/throat swelling, SOB or lightheadedness with hypotension: no Has patient had a PCN reaction causing severe rash involving mucus membranes or skin necrosis: no Has patient had a PCN reaction that required hospitalization? no Has patient had a PCN reaction occurring within the last 10 years: no If all of the above answers are "NO", then may proceed with Cephalosporin use.      Review of Systems   Review of Systems: Negative Unless Checked Constitutional: [] Weight loss  [] Fever  [] Chills Cardiac: [] Chest pain   []  Atrial Fibrillation  [] Palpitations   [] Shortness of breath when laying flat   [] Shortness of breath with exertion. [] Shortness of breath at rest Vascular:  [] Pain in legs with walking   []   Pain in legs with standing [] Pain in legs when laying flat   [] Claudication    [] Pain in feet when laying flat    [] History of DVT   [] Phlebitis   [] Swelling in legs   [] Varicose veins   [x] Non-healing ulcers Pulmonary:   [] Uses home oxygen   [] Productive cough   [] Hemoptysis   [] Wheeze  [] COPD   [] Asthma Neurologic:  [] Dizziness   [] Seizures  [] Blackouts [] History of stroke   [] History of TIA  [] Aphasia   [] Temporary Blindness   [] Weakness or numbness in arm   [] Weakness or numbness in leg Musculoskeletal:   [] Joint swelling   [] Joint pain   [] Low back pain  []  History of Knee Replacement [] Arthritis [] back Surgeries  []  Spinal Stenosis    Hematologic:  [] Easy bruising  [] Easy bleeding   [] Hypercoagulable state   [] Anemic Gastrointestinal:  [] Diarrhea   [] Vomiting  [] Gastroesophageal reflux/heartburn   [] Difficulty swallowing. [] Abdominal pain Genitourinary:  [] Chronic  kidney disease   [] Difficult urination  [] Anuric   [] Blood in urine [] Frequent urination  [] Burning with urination   [] Hematuria Skin:  [] Rashes   [] Ulcers [] Wounds Psychological:  [] History of anxiety   []  History of major depression  []  Memory Difficulties      OBJECTIVE:   Physical Exam  BP 133/70 (BP Location: Right Arm)   Pulse 77   Resp 16   Wt 214 lb (97.1 kg)   BMI 31.60 kg/m   Gen: WD/WN, NAD Head: [place]/AT, No temporalis wasting.  Ear/Nose/Throat: Hearing grossly intact, nares w/o erythema or drainage Eyes: PER, EOMI, sclera nonicteric.  Neck: Supple, no masses.  No JVD.  Pulmonary:  Good air movement, no use of accessory muscles.  Cardiac: RRR Vascular:  Vessel Right Left  Radial Palpable Palpable  Dorsalis Pedis Palpable Palpable  Posterior Tibial Palpable Palpable   Gastrointestinal: soft, non-distended. No guarding/no peritoneal signs.  Musculoskeletal: M/S 5/5 throughout.  No deformity or atrophy.  Neurologic: Pain and light touch intact in extremities.  Symmetrical.  Speech is fluent. Motor exam as listed above. Psychiatric: Judgment intact, Mood & affect appropriate for pt's clinical situation. Dermatologic: No Venous rashes. Small left leg ulcer  No changes consistent with cellulitis. Lymph : No Cervical lymphadenopathy, no lichenification or skin changes of chronic lymphedema.       ASSESSMENT AND PLAN:  1. Varicose veins with ulcer, left ([place]) No surgery or intervention at this point in time.    I have had a long discussion with the patient regarding venous insufficiency and why it  causes symptoms, specifically venous ulceration . I have discussed with the patient the chronic skin changes that accompany venous insufficiency and the long term sequela such as infection and recurring  ulceration.  Patient will be placed in [employer] which will be changed weekly drainage permitting.  In addition, behavioral modification including several periods of  elevation of the lower extremities during the day will be continued. Achieving a position with the ankles at heart level was stressed to the patient  The patient is instructed to begin routine exercise, especially walking on a daily basis  She will follow up for evaluation in four weeks..    2. Essential hypertension Continue antihypertensive medications as already ordered, these medications have been reviewed and there are no changes at this time.   3. Asthma in adult without complication, unspecified asthma severity, unspecified whether persistent Continue pulmonary medications and aerosols as already ordered, these medications have been reviewed and there are no changes at  this time.     Current Outpatient Medications on File Prior to Visit  Medication Sig Dispense Refill  . albuterol (PROVENTIL HFA;VENTOLIN HFA) 108 (90 BASE) MCG/ACT inhaler Inhale 2 puffs into the lungs every 6 (six) hours as needed for wheezing or shortness of breath. 1 Inhaler 0  . aspirin EC 81 MG tablet Take 81 mg by mouth daily.    . Butalbital-APAP-Caffeine 50-300-40 MG CAPS take 1 to 2 capsules by mouth every 6 hours if needed for headache    . diphenoxylate-atropine (LOMOTIL) 2.5-0.025 MG tablet Take 1 tablet by mouth 4 (four) times daily as needed for diarrhea or loose stools. 20 tablet 0  . Elastic Bandages & Supports (EMS ANTI-EMBOLISM STOCKINGS) MISC Apply in the morning and remove at night.    . famotidine (PEPCID) 20 MG tablet Take by mouth.    . gabapentin (NEURONTIN) 300 MG capsule Take 1 capsule (300 mg total) by mouth at bedtime. 30 capsule 0  . lisinopril (PRINIVIL,ZESTRIL) 40 MG tablet Take 1 tablet by mouth daily.  0  . meclizine (ANTIVERT) 12.5 MG tablet Take 1 tablet (12.5 mg total) by mouth 3 (three) times daily as needed for dizziness or nausea. 30 tablet 0  . meclizine (ANTIVERT) 12.5 MG tablet Take 1 tablet (12.5 mg total) by mouth 3 (three) times daily as needed for dizziness. 30 tablet 0   . potassium chloride SA (K-DUR,KLOR-CON) 20 MEQ tablet Take 20 mEq by mouth daily.     . silver sulfADIAZINE (SILVADENE) 1 % cream Apply 1 application topically 2 (two) times daily. 50 g 0  . doxycycline (VIBRA-TABS) 100 MG tablet Take 1 tablet (100 mg total) by mouth 2 (two) times daily. (Patient not taking: Reported on 05/23/2018) 20 tablet 0  . traMADol (ULTRAM) 50 MG tablet Take by mouth every 6 (six) hours as needed.    . triamcinolone cream (KENALOG) 0.1 % Apply 1 application topically 2 (two) times daily. As needed x 10 days max. (Patient not taking: Reported on 05/23/2018) 30 g 0   No current facility-administered medications on file prior to visit.     There are no Patient Instructions on file for this visit. No follow-ups on file.   Kris Hartmann, NP  This note was completed with Sales executive.  Any errors are purely unintentional.

## 2018-08-12 NOTE — Telephone Encounter (Signed)
See notes below

## 2018-08-12 NOTE — Telephone Encounter (Signed)
See if she would like to come have her wraps changed sooner or if she would like to keep her current wrap appointment.  If she wants to come in sooner that is fine and one of the CMAs can grab me to take a look

## 2018-08-13 NOTE — Telephone Encounter (Signed)
I called patient to advise of message below.... Patient stated she wants to be seen sooner because of the pain in her L leg. Her nurse visit apt was moved to tomorrow- 08/14/18 at 845am. Patient stated she was out of work now because of the pain in her leg. AS, CMA

## 2018-08-14 ENCOUNTER — Ambulatory Visit (INDEPENDENT_AMBULATORY_CARE_PROVIDER_SITE_OTHER): Payer: Medicare Other | Admitting: Nurse Practitioner

## 2018-08-14 ENCOUNTER — Ambulatory Visit (INDEPENDENT_AMBULATORY_CARE_PROVIDER_SITE_OTHER): Payer: Medicare Other

## 2018-08-14 ENCOUNTER — Other Ambulatory Visit (INDEPENDENT_AMBULATORY_CARE_PROVIDER_SITE_OTHER): Payer: Self-pay | Admitting: Nurse Practitioner

## 2018-08-14 ENCOUNTER — Other Ambulatory Visit: Payer: Self-pay

## 2018-08-14 ENCOUNTER — Encounter (INDEPENDENT_AMBULATORY_CARE_PROVIDER_SITE_OTHER): Payer: Self-pay

## 2018-08-14 VITALS — BP 147/77 | HR 69 | Resp 16 | Wt 213.6 lb

## 2018-08-14 DIAGNOSIS — M79604 Pain in right leg: Secondary | ICD-10-CM

## 2018-08-14 DIAGNOSIS — L03115 Cellulitis of right lower limb: Secondary | ICD-10-CM

## 2018-08-14 DIAGNOSIS — L97929 Non-pressure chronic ulcer of unspecified part of left lower leg with unspecified severity: Secondary | ICD-10-CM | POA: Diagnosis not present

## 2018-08-14 DIAGNOSIS — I83029 Varicose veins of left lower extremity with ulcer of unspecified site: Secondary | ICD-10-CM | POA: Diagnosis not present

## 2018-08-14 MED ORDER — DOXYCYCLINE HYCLATE 100 MG PO TABS: 100.0000 mg | ORAL_TABLET | Freq: Two times a day (BID) | ORAL | 0 refills | Status: DC

## 2018-08-14 NOTE — Progress Notes (Signed)
History of Present Illness  Patient complained of pain and swelling of right lower extremity.  DVT study negative.  Started on doxycycline.  Assessments & Plan   There are no diagnoses linked to this encounter.    Additional instructions  Subjective:  Patient presents with venous ulcer of the Bilateral lower extremity.    Procedure:  3 layer unna wrap was placed Bilateral lower extremity.   Plan:   Follow up in one week.

## 2018-08-15 ENCOUNTER — Encounter (INDEPENDENT_AMBULATORY_CARE_PROVIDER_SITE_OTHER): Payer: Medicare Other

## 2018-08-22 ENCOUNTER — Encounter (INDEPENDENT_AMBULATORY_CARE_PROVIDER_SITE_OTHER): Payer: Self-pay

## 2018-08-22 ENCOUNTER — Other Ambulatory Visit: Payer: Self-pay

## 2018-08-22 ENCOUNTER — Ambulatory Visit (INDEPENDENT_AMBULATORY_CARE_PROVIDER_SITE_OTHER): Payer: Medicare Other | Admitting: Nurse Practitioner

## 2018-08-22 VITALS — BP 143/74 | HR 66 | Resp 14 | Ht 69.0 in | Wt 215.0 lb

## 2018-08-22 DIAGNOSIS — L97929 Non-pressure chronic ulcer of unspecified part of left lower leg with unspecified severity: Secondary | ICD-10-CM | POA: Diagnosis not present

## 2018-08-22 DIAGNOSIS — I83029 Varicose veins of left lower extremity with ulcer of unspecified site: Secondary | ICD-10-CM

## 2018-08-22 NOTE — Progress Notes (Signed)
History of Present Illness  There is no documented history at this time  Assessments & Plan   There are no diagnoses linked to this encounter.    Additional instructions  Subjective:  Patient presents with venous ulcer of the Bilateral lower extremity.    Procedure:  3 layer unna wrap was placed Bilateral lower extremity.   Plan:   Follow up in one week.  

## 2018-08-28 ENCOUNTER — Encounter (INDEPENDENT_AMBULATORY_CARE_PROVIDER_SITE_OTHER): Payer: Self-pay | Admitting: Nurse Practitioner

## 2018-08-29 ENCOUNTER — Encounter (INDEPENDENT_AMBULATORY_CARE_PROVIDER_SITE_OTHER): Payer: Self-pay | Admitting: Nurse Practitioner

## 2018-08-29 ENCOUNTER — Ambulatory Visit (INDEPENDENT_AMBULATORY_CARE_PROVIDER_SITE_OTHER): Payer: Medicare Other | Admitting: Nurse Practitioner

## 2018-08-29 ENCOUNTER — Other Ambulatory Visit: Payer: Self-pay

## 2018-08-29 VITALS — BP 136/76 | HR 69 | Resp 16 | Ht 69.0 in | Wt 215.0 lb

## 2018-08-29 DIAGNOSIS — I83029 Varicose veins of left lower extremity with ulcer of unspecified site: Secondary | ICD-10-CM

## 2018-08-29 DIAGNOSIS — L03115 Cellulitis of right lower limb: Secondary | ICD-10-CM | POA: Diagnosis not present

## 2018-08-29 DIAGNOSIS — I10 Essential (primary) hypertension: Secondary | ICD-10-CM

## 2018-08-29 DIAGNOSIS — J45909 Unspecified asthma, uncomplicated: Secondary | ICD-10-CM | POA: Diagnosis not present

## 2018-08-29 DIAGNOSIS — L97929 Non-pressure chronic ulcer of unspecified part of left lower leg with unspecified severity: Secondary | ICD-10-CM

## 2018-08-29 MED ORDER — SULFAMETHOXAZOLE-TRIMETHOPRIM 800-160 MG PO TABS: 1.0000 | ORAL_TABLET | Freq: Two times a day (BID) | ORAL | 0 refills | Status: DC

## 2018-09-05 ENCOUNTER — Ambulatory Visit (INDEPENDENT_AMBULATORY_CARE_PROVIDER_SITE_OTHER): Payer: Medicare Other | Admitting: Nurse Practitioner

## 2018-09-05 ENCOUNTER — Encounter (INDEPENDENT_AMBULATORY_CARE_PROVIDER_SITE_OTHER): Payer: Self-pay | Admitting: Nurse Practitioner

## 2018-09-05 ENCOUNTER — Other Ambulatory Visit (INDEPENDENT_AMBULATORY_CARE_PROVIDER_SITE_OTHER): Payer: Self-pay | Admitting: Nurse Practitioner

## 2018-09-05 ENCOUNTER — Other Ambulatory Visit: Payer: Self-pay

## 2018-09-05 VITALS — BP 145/72 | HR 72 | Resp 16 | Wt 213.0 lb

## 2018-09-05 DIAGNOSIS — I83029 Varicose veins of left lower extremity with ulcer of unspecified site: Secondary | ICD-10-CM

## 2018-09-05 DIAGNOSIS — L97929 Non-pressure chronic ulcer of unspecified part of left lower leg with unspecified severity: Secondary | ICD-10-CM | POA: Diagnosis not present

## 2018-09-05 NOTE — Progress Notes (Signed)
History of Present Illness  There is no documented history at this time  Assessments & Plan   There are no diagnoses linked to this encounter.    Additional instructions  Subjective:  Patient presents with venous ulcer of the Right lower extremity.    Procedure:  3 layer unna wrap was placed Right lower extremity.   Plan:   Follow up in one week.   

## 2018-09-07 NOTE — Progress Notes (Signed)
SUBJECTIVE:  Patient ID: Janet [name], female    DOB: 1950-11-13, 68 y.o.   MRN: [id number] Chief Complaint  Patient presents with  . Follow-up    unna check    HPI  Janet [name] is a 68 y.o. female that presents today for evaluation of bilateral unna wraps.  The ulceration on her left lower extremity is resolved.  The patient still has complaints of tenderness and pain of right lower extremity, and previous dvt test was negative.  She denies fever, chills, nausea or vomiting.  Past Medical History:  Diagnosis Date  . Asthma   . Cancer ([place])    kidney  . Hypertension     Past Surgical History:  Procedure Laterality Date  . ABDOMINAL HYSTERECTOMY      Social History   Socioeconomic History  . Marital status: Married    Spouse name: Not on file  . Number of children: Not on file  . Years of education: Not on file  . Highest education level: Not on file  Occupational History  . Not on file  Social Needs  . Financial resource strain: Not on file  . Food insecurity    Worry: Not on file    Inability: Not on file  . Transportation needs    Medical: Not on file    Non-medical: Not on file  Tobacco Use  . Smoking status: Never Smoker  . Smokeless tobacco: Never Used  Substance and Sexual Activity  . Alcohol use: No  . Drug use: No  . Sexual activity: Not on file  Lifestyle  . Physical activity    Days per week: Not on file    Minutes per session: Not on file  . Stress: Not on file  Relationships  . Social Herbalist on phone: Not on file    Gets together: Not on file    Attends religious service: Not on file    Active member of club or organization: Not on file    Attends meetings of clubs or organizations: Not on file    Relationship status: Not on file  . Intimate partner violence    Fear of current or ex partner: Not on file    Emotionally abused: Not on file    Physically abused: Not on file    Forced sexual activity: Not on file  Other  Topics Concern  . Not on file  Social History Narrative  . Not on file    Family History  Problem Relation Age of Onset  . Stroke Neg Hx     Allergies  Allergen Reactions  . Amlodipine Swelling    Other reaction(s): Angioedema  . Shellfish Allergy Anaphylaxis  . Thiazide-Type Diuretics Other (See Comments)    Other reaction(s): Other (See Comments)  . Penicillins Hives and Other (See Comments)    Has patient had a PCN reaction causing immediate rash, facial/tongue/throat swelling, SOB or lightheadedness with hypotension: no Has patient had a PCN reaction causing severe rash involving mucus membranes or skin necrosis: no Has patient had a PCN reaction that required hospitalization? no Has patient had a PCN reaction occurring within the last 10 years: no If all of the above answers are "NO", then may proceed with Cephalosporin use.      Review of Systems   Review of Systems: Negative Unless Checked Constitutional: [] Weight loss  [] Fever  [] Chills Cardiac: [] Chest pain   []  Atrial Fibrillation  [] Palpitations   [] Shortness of breath when  laying flat   [] Shortness of breath with exertion. [] Shortness of breath at rest Vascular:  [] Pain in legs with walking   [] Pain in legs with standing [] Pain in legs when laying flat   [] Claudication    [] Pain in feet when laying flat    [] History of DVT   [] Phlebitis   [x] Swelling in legs   [x] Varicose veins   [] Non-healing ulcers Pulmonary:   [] Uses home oxygen   [] Productive cough   [] Hemoptysis   [] Wheeze  [] COPD   [] Asthma Neurologic:  [] Dizziness   [] Seizures  [] Blackouts [] History of stroke   [] History of TIA  [] Aphasia   [] Temporary Blindness   [] Weakness or numbness in arm   [x] Weakness or numbness in leg Musculoskeletal:   [] Joint swelling   [] Joint pain   [] Low back pain  []  History of Knee Replacement [] Arthritis [] back Surgeries  []  Spinal Stenosis    Hematologic:  [] Easy bruising  [] Easy bleeding   [] Hypercoagulable state   [] Anemic  Gastrointestinal:  [] Diarrhea   [] Vomiting  [] Gastroesophageal reflux/heartburn   [] Difficulty swallowing. [] Abdominal pain Genitourinary:  [] Chronic kidney disease   [] Difficult urination  [] Anuric   [] Blood in urine [] Frequent urination  [] Burning with urination   [] Hematuria Skin:  [] Rashes   [] Ulcers [] Wounds Psychological:  [] History of anxiety   []  History of major depression  []  Memory Difficulties      OBJECTIVE:   Physical Exam  BP 136/76 (BP Location: Right Arm)   Pulse 69   Resp 16   Ht 5\' 9"  (1.753 m)   Wt 215 lb (97.5 kg)   BMI 31.75 kg/m   Gen: WD/WN, NAD Head: [place]/AT, No temporalis wasting.  Ear/Nose/Throat: Hearing grossly intact, nares w/o erythema or drainage Eyes: PER, EOMI, sclera nonicteric.  Neck: Supple, no masses.  No JVD.  Pulmonary:  Good air movement, no use of accessory muscles.  Cardiac: RRR Vascular:  Vessel Right Left  Radial Palpable Palpable  Dorsalis Pedis Palpable Palpable  Posterior Tibial Palpable Palpable   Gastrointestinal: soft, non-distended. No guarding/no peritoneal signs.  Musculoskeletal: M/S 5/5 throughout.  No deformity or atrophy.  Neurologic: Pain and light touch intact in extremities.  Symmetrical.  Speech is fluent. Motor exam as listed above. Psychiatric: Judgment intact, Mood & affect appropriate for pt's clinical situation. Dermatologic: No Venous rashes. No Ulcers Noted.  cellulitis R lower extremity Lymph : No Cervical lymphadenopathy, dermal thickening bilaterally       ASSESSMENT AND PLAN:  1. Cellulitis of right lower extremity We will try different antibiotic agent to see if this resolves her cellulitis.  She continues to have pain and redness we may also try to move her wraps as it may be a component of irritation.  Otherwise we may need to place a infectious disease referral. - sulfamethoxazole-trimethoprim (BACTRIM DS) 800-160 MG tablet; Take 1 tablet by mouth 2 (two) times daily.  Dispense: 14 tablet; Refill:  0  2. Asthma in adult without complication, unspecified asthma severity, unspecified whether persistent Continue pulmonary medications and aerosols as already ordered, these medications have been reviewed and there are no changes at this time.    3. Varicose veins with ulcer, left ([place]) Resolved, removed from unna wraps and advised to continue with compression, elevation and exercise  4. Essential hypertension Continue antihypertensive medications as already ordered, these medications have been reviewed and there are no changes at this time.    Current Outpatient Medications on File Prior to Visit  Medication Sig Dispense Refill  . albuterol (  PROVENTIL HFA;VENTOLIN HFA) 108 (90 BASE) MCG/ACT inhaler Inhale 2 puffs into the lungs every 6 (six) hours as needed for wheezing or shortness of breath. 1 Inhaler 0  . aspirin EC 81 MG tablet Take 81 mg by mouth daily.    . Butalbital-APAP-Caffeine 50-300-40 MG CAPS take 1 to 2 capsules by mouth every 6 hours if needed for headache    . diphenoxylate-atropine (LOMOTIL) 2.5-0.025 MG tablet Take 1 tablet by mouth 4 (four) times daily as needed for diarrhea or loose stools. 20 tablet 0  . doxycycline (VIBRA-TABS) 100 MG tablet Take 1 tablet (100 mg total) by mouth 2 (two) times daily. (Patient not taking: Reported on 09/05/2018) 20 tablet 0  . Elastic Bandages & Supports (EMS ANTI-EMBOLISM STOCKINGS) MISC Apply in the morning and remove at night.    . famotidine (PEPCID) 20 MG tablet Take by mouth.    . gabapentin (NEURONTIN) 300 MG capsule Take 1 capsule (300 mg total) by mouth at bedtime. 30 capsule 0  . lisinopril (PRINIVIL,ZESTRIL) 40 MG tablet Take 1 tablet by mouth daily.  0  . meclizine (ANTIVERT) 12.5 MG tablet Take 1 tablet (12.5 mg total) by mouth 3 (three) times daily as needed for dizziness or nausea. 30 tablet 0  . meclizine (ANTIVERT) 12.5 MG tablet Take 1 tablet (12.5 mg total) by mouth 3 (three) times daily as needed for dizziness. 30  tablet 0  . potassium chloride SA (K-DUR,KLOR-CON) 20 MEQ tablet Take 20 mEq by mouth daily.     . silver sulfADIAZINE (SILVADENE) 1 % cream Apply 1 application topically 2 (two) times daily. 50 g 0  . traMADol (ULTRAM) 50 MG tablet Take by mouth every 6 (six) hours as needed.    . triamcinolone cream (KENALOG) 0.1 % Apply 1 application topically 2 (two) times daily. As needed x 10 days max. (Patient not taking: Reported on 05/23/2018) 30 g 0   No current facility-administered medications on file prior to visit.     There are no Patient Instructions on file for this visit. No follow-ups on file.   Kris Hartmann, NP  This note was completed with Sales executive.  Any errors are purely unintentional.

## 2018-09-08 ENCOUNTER — Encounter (INDEPENDENT_AMBULATORY_CARE_PROVIDER_SITE_OTHER): Payer: Self-pay | Admitting: Nurse Practitioner

## 2018-09-12 ENCOUNTER — Other Ambulatory Visit: Payer: Self-pay

## 2018-09-12 ENCOUNTER — Encounter (INDEPENDENT_AMBULATORY_CARE_PROVIDER_SITE_OTHER): Payer: Self-pay

## 2018-09-12 ENCOUNTER — Ambulatory Visit (INDEPENDENT_AMBULATORY_CARE_PROVIDER_SITE_OTHER): Payer: Medicare Other | Admitting: Nurse Practitioner

## 2018-09-12 ENCOUNTER — Encounter (INDEPENDENT_AMBULATORY_CARE_PROVIDER_SITE_OTHER): Payer: Non-veteran care

## 2018-09-12 VITALS — BP 133/75 | HR 77 | Resp 16 | Wt 214.0 lb

## 2018-09-12 DIAGNOSIS — I83029 Varicose veins of left lower extremity with ulcer of unspecified site: Secondary | ICD-10-CM | POA: Diagnosis not present

## 2018-09-12 DIAGNOSIS — L97929 Non-pressure chronic ulcer of unspecified part of left lower leg with unspecified severity: Secondary | ICD-10-CM | POA: Diagnosis not present

## 2018-09-12 NOTE — Progress Notes (Signed)
History of Present Illness  There is no documented history at this time  Assessments & Plan   There are no diagnoses linked to this encounter.    Additional instructions  Subjective:  Patient presents with venous ulcer of the Right lower extremity.    Procedure:  3 layer unna wrap was placed Right lower extremity.   Plan:   Follow up in one week.   

## 2018-09-14 ENCOUNTER — Encounter (INDEPENDENT_AMBULATORY_CARE_PROVIDER_SITE_OTHER): Payer: Self-pay | Admitting: Nurse Practitioner

## 2018-09-14 NOTE — Addendum Note (Signed)
Addended by: Kris Hartmann on: 09/14/2018 01:42 AM   Modules accepted: Level of Service

## 2018-09-19 ENCOUNTER — Ambulatory Visit (INDEPENDENT_AMBULATORY_CARE_PROVIDER_SITE_OTHER): Payer: Medicare Other | Admitting: Nurse Practitioner

## 2018-09-19 ENCOUNTER — Encounter (INDEPENDENT_AMBULATORY_CARE_PROVIDER_SITE_OTHER): Payer: Non-veteran care

## 2018-09-19 ENCOUNTER — Encounter (INDEPENDENT_AMBULATORY_CARE_PROVIDER_SITE_OTHER): Payer: Self-pay | Admitting: Nurse Practitioner

## 2018-09-19 ENCOUNTER — Other Ambulatory Visit: Payer: Self-pay

## 2018-09-19 VITALS — BP 138/72 | HR 70 | Resp 16 | Wt 217.0 lb

## 2018-09-19 DIAGNOSIS — I10 Essential (primary) hypertension: Secondary | ICD-10-CM

## 2018-09-19 DIAGNOSIS — L03115 Cellulitis of right lower limb: Secondary | ICD-10-CM

## 2018-09-19 DIAGNOSIS — J45909 Unspecified asthma, uncomplicated: Secondary | ICD-10-CM

## 2018-09-19 MED ORDER — DOXYCYCLINE HYCLATE 100 MG PO TABS: 100.0000 mg | ORAL_TABLET | Freq: Two times a day (BID) | ORAL | 0 refills | Status: DC

## 2018-09-21 ENCOUNTER — Encounter (INDEPENDENT_AMBULATORY_CARE_PROVIDER_SITE_OTHER): Payer: Self-pay | Admitting: Nurse Practitioner

## 2018-09-21 NOTE — Progress Notes (Signed)
SUBJECTIVE:  Patient ID: Janet [name], female    DOB: Aug 31, 1950, 68 y.o.   MRN: [id number] Chief Complaint  Patient presents with  . Follow-up    [place] check    HPI  Janet [name] is a 68 y.o. female presents today with exquisite pain of her right lower extremity.  Pain is located near her right lateral calf and it is very tender to the touch.  The patient has been in a wraps to treat this as well as try Bactrim as well as Keflex for antibiotic treatment.  The area is slightly erythematous.  She has some edema but it is greatly reduced from the past several weeks.  The patient denies any fever, chills, nausea, vomiting or diarrhea.  She denies any chest pain or shortness of breath.  She denies any TIA-like symptoms.  When the patient's swelling initially started several weeks ago, a DVT study was done and there was no evidence of DVT in the right lower extremity  Past Medical History:  Diagnosis Date  . Asthma   . Cancer ([place])    kidney  . Hypertension     Past Surgical History:  Procedure Laterality Date  . ABDOMINAL HYSTERECTOMY      Social History   Socioeconomic History  . Marital status: Married    Spouse name: Not on file  . Number of children: Not on file  . Years of education: Not on file  . Highest education level: Not on file  Occupational History  . Not on file  Social Needs  . Financial resource strain: Not on file  . Food insecurity    Worry: Not on file    Inability: Not on file  . Transportation needs    Medical: Not on file    Non-medical: Not on file  Tobacco Use  . Smoking status: Never Smoker  . Smokeless tobacco: Never Used  Substance and Sexual Activity  . Alcohol use: No  . Drug use: No  . Sexual activity: Not on file  Lifestyle  . Physical activity    Days per week: Not on file    Minutes per session: Not on file  . Stress: Not on file  Relationships  . Social Herbalist on phone: Not on file    Gets together: Not on file    Attends religious service: Not on file    Active member of club or organization: Not on file    Attends meetings of clubs or organizations: Not on file    Relationship status: Not on file  . Intimate partner violence    Fear of current or ex partner: Not on file    Emotionally abused: Not on file    Physically abused: Not on file    Forced sexual activity: Not on file  Other Topics Concern  . Not on file  Social History Narrative  . Not on file    Family History  Problem Relation Age of Onset  . Stroke Neg Hx     Allergies  Allergen Reactions  . Amlodipine Swelling    Other reaction(s): Angioedema  . Shellfish Allergy Anaphylaxis  . Thiazide-Type Diuretics Other (See Comments)    Other reaction(s): Other (See Comments)  . Penicillins Hives and Other (See Comments)    Has patient had a PCN reaction causing immediate rash, facial/tongue/throat swelling, SOB or lightheadedness with hypotension: no Has patient had a PCN reaction causing severe rash involving mucus membranes or skin necrosis:  no Has patient had a PCN reaction that required hospitalization? no Has patient had a PCN reaction occurring within the last 10 years: no If all of the above answers are "NO", then may proceed with Cephalosporin use.      Review of Systems   Review of Systems: Negative Unless Checked Constitutional: [] Weight loss  [] Fever  [] Chills Cardiac: [] Chest pain   []  Atrial Fibrillation  [] Palpitations   [] Shortness of breath when laying flat   [] Shortness of breath with exertion. [] Shortness of breath at rest Vascular:  [] Pain in legs with walking   [] Pain in legs with standing [] Pain in legs when laying flat   [] Claudication    [] Pain in feet when laying flat    [] History of DVT   [] Phlebitis   [x] Swelling in legs   [] Varicose veins   [] Non-healing ulcers Pulmonary:   [] Uses home oxygen   [] Productive cough   [] Hemoptysis   [] Wheeze  [] COPD   [x] Asthma Neurologic:  [] Dizziness   [] Seizures   [] Blackouts [] History of stroke   [] History of TIA  [] Aphasia   [] Temporary Blindness   [] Weakness or numbness in arm   [] Weakness or numbness in leg Musculoskeletal:   [] Joint swelling   [] Joint pain   [] Low back pain  []  History of Knee Replacement [] Arthritis [] back Surgeries  []  Spinal Stenosis    Hematologic:  [] Easy bruising  [] Easy bleeding   [] Hypercoagulable state   [] Anemic Gastrointestinal:  [] Diarrhea   [] Vomiting  [] Gastroesophageal reflux/heartburn   [] Difficulty swallowing. [] Abdominal pain Genitourinary:  [] Chronic kidney disease   [] Difficult urination  [] Anuric   [] Blood in urine [] Frequent urination  [] Burning with urination   [] Hematuria Skin:  [] Rashes   [] Ulcers [] Wounds Psychological:  [] History of anxiety   []  History of major depression  []  Memory Difficulties      OBJECTIVE:   Physical Exam  BP 138/72 (BP Location: Right Arm)   Pulse 70   Resp 16   Wt 217 lb (98.4 kg)   BMI 32.05 kg/m   Gen: WD/WN, NAD Head: [place]/AT, No temporalis wasting.  Ear/Nose/Throat: Hearing grossly intact, nares w/o erythema or drainage Eyes: PER, EOMI, sclera nonicteric.  Neck: Supple, no masses.  No JVD.  Pulmonary:  Good air movement, no use of accessory muscles.  Cardiac: RRR Vascular:  Vessel Right Left  Radial Palpable Palpable  Brachial Palpable Palpable  Femoral Palpable Palpable  Popliteal Palpable Palpable  Dorsalis Pedis Palpable Palpable  Posterior Tibial Palpable Palpable   Gastrointestinal: soft, non-distended. No guarding/no peritoneal signs.  Musculoskeletal: M/S 5/5 throughout.  No deformity or atrophy.  Neurologic: Pain and light touch intact in extremities.  Symmetrical.  Speech is fluent. Motor exam as listed above. Psychiatric: Judgment intact, Mood & affect appropriate for pt's clinical situation. Dermatologic: No Venous rashes. No Ulcers Noted.  No changes consistent with cellulitis. Lymph : No Cervical lymphadenopathy, no lichenification or skin changes  of chronic lymphedema.       ASSESSMENT AND PLAN:  1. Cellulitis of right lower extremity We will try doxycycline as well as a round of prednisone taper to see if this will reduce the inflammation and the cellulitis.  If the patient continues to have pain and swelling may consider consulting dermatology or possibly pain management if there is no evidence of cellulitis at that time.  Patient is to follow-up in 2 weeks - doxycycline (VIBRA-TABS) 100 MG tablet; Take 1 tablet (100 mg total) by mouth 2 (two) times daily.  Dispense: 20 tablet; Refill:  0  2. Essential hypertension Continue antihypertensive medications as already ordered, these medications have been reviewed and there are no changes at this time.   3. Asthma in adult without complication, unspecified asthma severity, unspecified whether persistent Continue pulmonary medications and aerosols as already ordered, these medications have been reviewed and there are no changes at this time.    Current Outpatient Medications on File Prior to Visit  Medication Sig Dispense Refill  . albuterol (PROVENTIL HFA;VENTOLIN HFA) 108 (90 BASE) MCG/ACT inhaler Inhale 2 puffs into the lungs every 6 (six) hours as needed for wheezing or shortness of breath. 1 Inhaler 0  . aspirin EC 81 MG tablet Take 81 mg by mouth daily.    . Butalbital-APAP-Caffeine 50-300-40 MG CAPS take 1 to 2 capsules by mouth every 6 hours if needed for headache    . diphenoxylate-atropine (LOMOTIL) 2.5-0.025 MG tablet Take 1 tablet by mouth 4 (four) times daily as needed for diarrhea or loose stools. 20 tablet 0  . Elastic Bandages & Supports (EMS ANTI-EMBOLISM STOCKINGS) MISC Apply in the morning and remove at night.    . famotidine (PEPCID) 20 MG tablet Take by mouth.    . gabapentin (NEURONTIN) 300 MG capsule Take 1 capsule (300 mg total) by mouth at bedtime. 30 capsule 0  . lisinopril (PRINIVIL,ZESTRIL) 40 MG tablet Take 1 tablet by mouth daily.  0  . meclizine  (ANTIVERT) 12.5 MG tablet Take 1 tablet (12.5 mg total) by mouth 3 (three) times daily as needed for dizziness or nausea. 30 tablet 0  . meclizine (ANTIVERT) 12.5 MG tablet Take 1 tablet (12.5 mg total) by mouth 3 (three) times daily as needed for dizziness. 30 tablet 0  . potassium chloride SA (K-DUR,KLOR-CON) 20 MEQ tablet Take 20 mEq by mouth daily.     . silver sulfADIAZINE (SILVADENE) 1 % cream Apply 1 application topically 2 (two) times daily. 50 g 0  . traMADol (ULTRAM) 50 MG tablet Take by mouth every 6 (six) hours as needed.    . triamcinolone cream (KENALOG) 0.1 % Apply 1 application topically 2 (two) times daily. As needed x 10 days max. 30 g 0  . sulfamethoxazole-trimethoprim (BACTRIM DS) 800-160 MG tablet Take 1 tablet by mouth 2 (two) times daily. (Patient not taking: Reported on 09/12/2018) 14 tablet 0   No current facility-administered medications on file prior to visit.     There are no Patient Instructions on file for this visit. No follow-ups on file.   Kris Hartmann, NP  This note was completed with Sales executive.  Any errors are purely unintentional.

## 2018-09-24 ENCOUNTER — Other Ambulatory Visit: Payer: Self-pay | Admitting: Family Medicine

## 2018-09-24 DIAGNOSIS — Z1231 Encounter for screening mammogram for malignant neoplasm of breast: Secondary | ICD-10-CM

## 2018-09-26 ENCOUNTER — Ambulatory Visit (INDEPENDENT_AMBULATORY_CARE_PROVIDER_SITE_OTHER): Payer: Non-veteran care | Admitting: Vascular Surgery

## 2018-09-30 ENCOUNTER — Other Ambulatory Visit: Payer: Self-pay

## 2018-09-30 ENCOUNTER — Ambulatory Visit
Admission: EM | Admit: 2018-09-30 | Discharge: 2018-09-30 | Disposition: A | Discharge status: Home / Self Care | Payer: Medicare Other | Attending: Family Medicine | Admitting: Family Medicine

## 2018-09-30 ENCOUNTER — Encounter: Payer: Self-pay | Admitting: Emergency Medicine

## 2018-09-30 DIAGNOSIS — M545 Low back pain, unspecified: Secondary | ICD-10-CM

## 2018-09-30 MED ORDER — BACLOFEN 10 MG PO TABS: 5.0000 mg | ORAL_TABLET | Freq: Three times a day (TID) | ORAL | 0 refills | Status: DC | PRN

## 2018-09-30 MED ORDER — TRAMADOL HCL 50 MG PO TABS: 50.0000 mg | ORAL_TABLET | Freq: Two times a day (BID) | ORAL | 0 refills | Status: DC | PRN

## 2018-09-30 NOTE — ED Provider Notes (Signed)
[facility]    CSN: [id number] Arrival date & time: 09/30/18  1547  History   Chief Complaint Chief Complaint  Patient presents with  . Back Pain   HPI  68 year old female presents with low back pain.  Patient reports that she has had left-sided low back pain for the past 3 days.  Seems to be worsening.  No recent fall, trauma, injury.  Worse with movement.  No relieving factors.  No saddle anesthesia or incontinence.  She has taken Tylenol without relief.  Pain is currently 7/10 in severity.  No other reported symptoms.  No other complaints or concerns at this time.  PMH, Surgical Hx, Family Hx, Social History reviewed and updated as below.  Past Medical History:  Diagnosis Date  . Asthma   . Cancer ([place])    kidney  . Hypertension    Patient Active Problem List   Diagnosis Date Noted  . Numbness and tingling of left lower extremity 06/23/2018  . Essential hypertension 05/02/2018  . Swelling of limb 05/02/2018  . Varicose veins with ulcer, left ([place]) 05/02/2018  . Asthma in adult without complication [id number]  . Bilateral cellulitis of lower leg 10/03/2015  . Generalized weakness 09/09/2014  . 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 ABDOMINAL HYSTERECTOMY      OB History    Gravida  3   Para      Term      Preterm      AB      Living  2     SAB      TAB      Ectopic      Multiple      Live Births               Home Medications    Prior to Admission medications   Medication Sig Start Date End Date Taking? Authorizing Provider  albuterol (PROVENTIL HFA;VENTOLIN HFA) 108 (90 BASE) MCG/ACT inhaler Inhale 2 puffs into the lungs every 6 (six) hours as needed for wheezing or shortness of breath. 12/24/14  Yes [name], MD  aspirin EC 81 MG tablet Take 81 mg by mouth daily.   Yes [provider]  Butalbital-APAP-Caffeine 50-300-40 MG CAPS take 1 to 2 capsules by mouth  every 6 hours if needed for headache 07/30/17  Yes [provider]  doxycycline (VIBRA-TABS) 100 MG tablet Take 1 tablet (100 mg total) by mouth 2 (two) times daily. 09/19/18  Yes Kris Hartmann, NP  lisinopril (PRINIVIL,ZESTRIL) 40 MG tablet Take 1 tablet by mouth daily. 08/27/14  Yes [provider]  meclizine (ANTIVERT) 12.5 MG tablet Take 1 tablet (12.5 mg total) by mouth 3 (three) times daily as needed for dizziness or nausea. 04/15/17  Yes Delman Kitten, MD  potassium chloride SA (K-DUR,KLOR-CON) 20 MEQ tablet Take 20 mEq by mouth daily.    Yes [provider]  silver sulfADIAZINE (SILVADENE) 1 % cream Apply 1 application topically 2 (two) times daily. 04/27/18  Yes Norval Gable, MD  baclofen (LIORESAL) 10 MG tablet Take 0.5-1 tablets (5-10 mg total) by mouth 3 (three) times daily as needed for muscle spasms. 09/30/18   Coral Spikes, DO  Elastic Bandages & Supports (EMS ANTI-EMBOLISM STOCKINGS) MISC Apply in the morning and remove at night. 06/23/14   [provider]  traMADol (ULTRAM) 50 MG tablet Take 1 tablet (50 mg total) by mouth every 12 (twelve) hours as needed  for severe pain. 09/30/18   Coral Spikes, DO  gabapentin (NEURONTIN) 300 MG capsule Take 1 capsule (300 mg total) by mouth at bedtime. 05/30/18 09/30/18  Kris Hartmann, NP    Family History Family History  Problem Relation Age of Onset  . Colon cancer Mother   . Hypertension Mother   . Heart attack Father 53  . Hypertension Father   . Hyperlipidemia Father   . Diabetes Father   . Stroke Neg Hx     Social History Social History   Tobacco Use  . Smoking status: Never Smoker  . Smokeless tobacco: Never Used  Substance Use Topics  . Alcohol use: No  . Drug use: No     Allergies   Amlodipine, Shellfish allergy, Thiazide-type diuretics, and Penicillins   Review of Systems Review of Systems  Genitourinary: Negative.   Musculoskeletal: Positive for back pain.   Physical Exam Triage  Vital Signs ED Triage Vitals  Enc Vitals Group     BP 09/30/18 1556 (!) 154/68     Pulse Rate 09/30/18 1556 69     Resp 09/30/18 1556 18     Temp 09/30/18 1556 97.8 F (36.6 C)     Temp Source 09/30/18 1556 Oral     SpO2 09/30/18 1556 98 %     Weight 09/30/18 1555 214 lb (97.1 kg)     Height 09/30/18 1555 5\' 9"  (1.753 m)     Head Circumference --      Peak Flow --      Pain Score 09/30/18 1555 7     Pain Loc --      Pain Edu? --      Excl. in [place]? --    Updated Vital Signs BP (!) 154/68 (BP Location: Left Arm)   Pulse 69   Temp 97.8 F (36.6 C) (Oral)   Resp 18   Ht 5\' 9"  (1.753 m)   Wt 97.1 kg   SpO2 98%   BMI 31.60 kg/m   Visual Acuity Right Eye Distance:   Left Eye Distance:   Bilateral Distance:    Right Eye Near:   Left Eye Near:    Bilateral Near:     Physical Exam Vitals signs and nursing note reviewed.  Constitutional:      General: She is not in acute distress.    Appearance: Normal appearance. She is not ill-appearing.  HENT:     Head: Normocephalic and atraumatic.  Eyes:     General: No scleral icterus.       Right eye: No discharge.        Left eye: No discharge.     Conjunctiva/sclera: Conjunctivae normal.  Cardiovascular:     Rate and Rhythm: Normal rate and regular rhythm.  Pulmonary:     Effort: Pulmonary effort is normal.     Breath sounds: Normal breath sounds. No wheezing, rhonchi or rales.  Musculoskeletal:     Comments: Lumbar spine: Left paraspinal musculature tender to palpation.  Neurological:     Mental Status: She is alert.  Psychiatric:        Mood and Affect: Mood normal.        Behavior: Behavior normal.    UC Treatments / Results  Labs (all labs ordered are listed, but only abnormal results are displayed) Labs Reviewed - No data to display  EKG   Radiology No results found.  Procedures Procedures (including critical care time)  Medications Ordered in UC Medications - No data  to display  Initial Impression  / Assessment and Plan / UC Course  I have reviewed the triage vital signs and the nursing notes.  Pertinent labs & imaging results that were available during my care of the patient were reviewed by me and considered in my medical decision making (see chart for details).    68 year old female presents with low back pain.  Treating with baclofen and tramadol.   Final Clinical Impressions(s) / UC Diagnoses   Final diagnoses:  Acute left-sided low back pain without sciatica     Discharge Instructions     Rest.  Heat.  Medications as directed.  Take care  Dr. Lacinda Axon    ED Prescriptions    Medication Sig Dispense Auth. Provider   baclofen (LIORESAL) 10 MG tablet Take 0.5-1 tablets (5-10 mg total) by mouth 3 (three) times daily as needed for muscle spasms. 30 each Thersa Salt G, DO   traMADol (ULTRAM) 50 MG tablet Take 1 tablet (50 mg total) by mouth every 12 (twelve) hours as needed for severe pain. 10 tablet Coral Spikes, DO     Controlled Substance Prescriptions [place] Controlled Substance Registry consulted? Not Applicable   Coral Spikes, DO 09/30/18 1815

## 2018-09-30 NOTE — ED Triage Notes (Signed)
Patient in today c/o left sided back pain x 3 days, getting worse. No injury noted. Pain is worse with movement. Patient denies urinary symptoms.

## 2018-09-30 NOTE — Discharge Instructions (Signed)
Rest.  Heat.  Medications as directed.  Take care  Dr. Dreanna Kyllo  

## 2018-10-03 ENCOUNTER — Ambulatory Visit (INDEPENDENT_AMBULATORY_CARE_PROVIDER_SITE_OTHER): Payer: Medicare Other | Admitting: Nurse Practitioner

## 2018-10-03 ENCOUNTER — Encounter (INDEPENDENT_AMBULATORY_CARE_PROVIDER_SITE_OTHER): Payer: Self-pay | Admitting: Nurse Practitioner

## 2018-10-03 ENCOUNTER — Other Ambulatory Visit: Payer: Self-pay

## 2018-10-03 VITALS — BP 139/81 | HR 78 | Resp 16 | Wt 213.2 lb

## 2018-10-03 DIAGNOSIS — J45909 Unspecified asthma, uncomplicated: Secondary | ICD-10-CM | POA: Diagnosis not present

## 2018-10-03 DIAGNOSIS — I10 Essential (primary) hypertension: Secondary | ICD-10-CM

## 2018-10-03 DIAGNOSIS — M7989 Other specified soft tissue disorders: Secondary | ICD-10-CM

## 2018-10-03 DIAGNOSIS — R600 Localized edema: Secondary | ICD-10-CM | POA: Diagnosis not present

## 2018-10-06 ENCOUNTER — Encounter (INDEPENDENT_AMBULATORY_CARE_PROVIDER_SITE_OTHER): Payer: Self-pay | Admitting: Nurse Practitioner

## 2018-10-06 NOTE — Progress Notes (Signed)
SUBJECTIVE:  Patient ID: Janet [name], female    DOB: 1950-05-06, 68 y.o.   MRN: [id number] Chief Complaint  Patient presents with  . Follow-up    2week follow up    HPI  Janet [name] is a 68 y.o. female the presents today for follow-up of swelling and recurrent cellulitis of the right lower extremity.  The patient was placed on a round of doxycycline as well as a steroid taper and at this point she states that the pain is much improved.  There still appears to be some edema however the patient states that she has compression stockings that will be delivered to the house within several days.  Otherwise she states that her legs feel better than they have in some time.  She denies any fever, chills, nausea, vomiting or diarrhea.  Past Medical History:  Diagnosis Date  . Asthma   . Cancer ([place])    kidney  . Hypertension     Past Surgical History:  Procedure Laterality Date  . ABDOMINAL HYSTERECTOMY      Social History   Socioeconomic History  . Marital status: Married    Spouse name: Not on file  . Number of children: Not on file  . Years of education: Not on file  . Highest education level: Not on file  Occupational History  . Not on file  Social Needs  . Financial resource strain: Not on file  . Food insecurity    Worry: Not on file    Inability: Not on file  . Transportation needs    Medical: Not on file    Non-medical: Not on file  Tobacco Use  . Smoking status: Never Smoker  . Smokeless tobacco: Never Used  Substance and Sexual Activity  . Alcohol use: No  . Drug use: No  . Sexual activity: Not on file  Lifestyle  . Physical activity    Days per week: Not on file    Minutes per session: Not on file  . Stress: Not on file  Relationships  . Social Herbalist on phone: Not on file    Gets together: Not on file    Attends religious service: Not on file    Active member of club or organization: Not on file    Attends meetings of clubs or  organizations: Not on file    Relationship status: Not on file  . Intimate partner violence    Fear of current or ex partner: Not on file    Emotionally abused: Not on file    Physically abused: Not on file    Forced sexual activity: Not on file  Other Topics Concern  . Not on file  Social History Narrative  . Not on file    Family History  Problem Relation Age of Onset  . Colon cancer Mother   . Hypertension Mother   . Heart attack Father 88  . Hypertension Father   . Hyperlipidemia Father   . Diabetes Father   . Stroke Neg Hx     Allergies  Allergen Reactions  . Amlodipine Swelling    Other reaction(s): Angioedema  . Shellfish Allergy Anaphylaxis  . Thiazide-Type Diuretics Other (See Comments)    Other reaction(s): Other (See Comments)  . Penicillins Hives and Other (See Comments)    Has patient had a PCN reaction causing immediate rash, facial/tongue/throat swelling, SOB or lightheadedness with hypotension: no Has patient had a PCN reaction causing severe rash involving mucus  membranes or skin necrosis: no Has patient had a PCN reaction that required hospitalization? no Has patient had a PCN reaction occurring within the last 10 years: no If all of the above answers are "NO", then may proceed with Cephalosporin use.      Review of Systems   Review of Systems: Negative Unless Checked Constitutional: [] Weight loss  [] Fever  [] Chills Cardiac: [] Chest pain   []  Atrial Fibrillation  [] Palpitations   [] Shortness of breath when laying flat   [] Shortness of breath with exertion. [] Shortness of breath at rest Vascular:  [] Pain in legs with walking   [] Pain in legs with standing [] Pain in legs when laying flat   [] Claudication    [] Pain in feet when laying flat    [] History of DVT   [] Phlebitis   [x] Swelling in legs   [] Varicose veins   [] Non-healing ulcers Pulmonary:   [] Uses home oxygen   [] Productive cough   [] Hemoptysis   [] Wheeze  [] COPD   [x] Asthma Neurologic:   [] Dizziness   [] Seizures  [] Blackouts [] History of stroke   [] History of TIA  [] Aphasia   [] Temporary Blindness   [] Weakness or numbness in arm   [] Weakness or numbness in leg Musculoskeletal:   [] Joint swelling   [] Joint pain   [] Low back pain  []  History of Knee Replacement [] Arthritis [] back Surgeries  []  Spinal Stenosis    Hematologic:  [] Easy bruising  [] Easy bleeding   [] Hypercoagulable state   [] Anemic Gastrointestinal:  [] Diarrhea   [] Vomiting  [] Gastroesophageal reflux/heartburn   [] Difficulty swallowing. [] Abdominal pain Genitourinary:  [] Chronic kidney disease   [] Difficult urination  [] Anuric   [] Blood in urine [] Frequent urination  [] Burning with urination   [] Hematuria Skin:  [] Rashes   [] Ulcers [] Wounds Psychological:  [x] History of anxiety   []  History of major depression  []  Memory Difficulties      OBJECTIVE:   Physical Exam  BP 139/81 (BP Location: Right Arm)   Pulse 78   Resp 16   Wt 213 lb 3.2 oz (96.7 kg)   BMI 31.48 kg/m   Gen: WD/WN, NAD Head: [place]/AT, No temporalis wasting.  Ear/Nose/Throat: Hearing grossly intact, nares w/o erythema or drainage Eyes: PER, EOMI, sclera nonicteric.  Neck: Supple, no masses.  No JVD.  Pulmonary:  Good air movement, no use of accessory muscles.  Cardiac: RRR Vascular:  1+ edema bilaterally Vessel Right Left  Radial Palpable Palpable  Dorsalis Pedis Palpable Palpable  Posterior Tibial Palpable Palpable   Gastrointestinal: soft, non-distended. No guarding/no peritoneal signs.  Musculoskeletal: M/S 5/5 throughout.  No deformity or atrophy.  Neurologic: Pain and light touch intact in extremities.  Symmetrical.  Speech is fluent. Motor exam as listed above. Psychiatric: Judgment intact, Mood & affect appropriate for pt's clinical situation. Dermatologic: No Venous rashes. No Ulcers Noted.  No changes consistent with cellulitis. Lymph : No Cervical lymphadenopathy, no lichenification or skin changes of chronic lymphedema.        ASSESSMENT AND PLAN:  1. Swelling of limb Patient is instructed to continue with conservative treatment therapies such as wearing medical grade 1 compression stockings on a daily basis, elevation and exercise.  The patient is instructed to place these first thing in the morning as well as to remove at bedtime and explicitly not to sleep in the stockings.  The patient will follow-up in 3 months to ensure that there is no further ulceration.  2. Essential hypertension Continue antihypertensive medications as already ordered, these medications have been reviewed and there are no  changes at this time.   3. Asthma in adult without complication, unspecified asthma severity, unspecified whether persistent Continue pulmonary medications and aerosols as already ordered, these medications have been reviewed and there are no changes at this time.     Current Outpatient Medications on File Prior to Visit  Medication Sig Dispense Refill  . albuterol (PROVENTIL HFA;VENTOLIN HFA) 108 (90 BASE) MCG/ACT inhaler Inhale 2 puffs into the lungs every 6 (six) hours as needed for wheezing or shortness of breath. 1 Inhaler 0  . aspirin EC 81 MG tablet Take 81 mg by mouth daily.    . Butalbital-APAP-Caffeine 50-300-40 MG CAPS take 1 to 2 capsules by mouth every 6 hours if needed for headache    . Elastic Bandages & Supports (EMS ANTI-EMBOLISM STOCKINGS) MISC Apply in the morning and remove at night.    Marland Kitchen lisinopril (PRINIVIL,ZESTRIL) 40 MG tablet Take 1 tablet by mouth daily.  0  . meclizine (ANTIVERT) 12.5 MG tablet Take 1 tablet (12.5 mg total) by mouth 3 (three) times daily as needed for dizziness or nausea. 30 tablet 0  . potassium chloride SA (K-DUR,KLOR-CON) 20 MEQ tablet Take 20 mEq by mouth daily.     . baclofen (LIORESAL) 10 MG tablet Take 0.5-1 tablets (5-10 mg total) by mouth 3 (three) times daily as needed for muscle spasms. (Patient not taking: Reported on 10/03/2018) 30 each 0  . doxycycline  (VIBRA-TABS) 100 MG tablet Take 1 tablet (100 mg total) by mouth 2 (two) times daily. (Patient not taking: Reported on 10/03/2018) 20 tablet 0  . silver sulfADIAZINE (SILVADENE) 1 % cream Apply 1 application topically 2 (two) times daily. (Patient not taking: Reported on 10/03/2018) 50 g 0  . traMADol (ULTRAM) 50 MG tablet Take 1 tablet (50 mg total) by mouth every 12 (twelve) hours as needed for severe pain. (Patient not taking: Reported on 10/03/2018) 10 tablet 0  . [DISCONTINUED] gabapentin (NEURONTIN) 300 MG capsule Take 1 capsule (300 mg total) by mouth at bedtime. 30 capsule 0   No current facility-administered medications on file prior to visit.     There are no Patient Instructions on file for this visit. No follow-ups on file.   Kris Hartmann, NP  This note was completed with Sales executive.  Any errors are purely unintentional.

## 2018-10-29 ENCOUNTER — Ambulatory Visit
Admission: RE | Admit: 2018-10-29 | Discharge: 2018-10-29 | Disposition: A | Discharge status: Home / Self Care | Payer: Medicare Other | Source: Ambulatory Visit | Attending: Family Medicine | Admitting: Family Medicine

## 2018-10-29 DIAGNOSIS — Z1231 Encounter for screening mammogram for malignant neoplasm of breast: Secondary | ICD-10-CM | POA: Insufficient documentation

## 2019-01-06 ENCOUNTER — Other Ambulatory Visit: Payer: Self-pay

## 2019-01-06 ENCOUNTER — Ambulatory Visit (INDEPENDENT_AMBULATORY_CARE_PROVIDER_SITE_OTHER): Payer: Medicare Other | Admitting: Vascular Surgery

## 2019-01-06 ENCOUNTER — Encounter (INDEPENDENT_AMBULATORY_CARE_PROVIDER_SITE_OTHER): Payer: Self-pay | Admitting: Vascular Surgery

## 2019-01-06 VITALS — BP 135/78 | HR 72 | Resp 16 | Wt 210.0 lb

## 2019-01-06 DIAGNOSIS — I10 Essential (primary) hypertension: Secondary | ICD-10-CM

## 2019-01-06 DIAGNOSIS — M7989 Other specified soft tissue disorders: Secondary | ICD-10-CM

## 2019-01-06 NOTE — Progress Notes (Signed)
Patient ID: Janet [name], female   DOB: 13-Jun-1950, 68 y.o.   MRN: [id number]  Chief Complaint  Patient presents with  . Follow-up    84month ultrasound follow up    HPI Janet [name] is a 68 y.o. female.  Patient returns in follow-up of her lower extremity swelling.  With compression, elevation, and increasing her activity her leg swelling has improved.  She has marked stasis dermatitis changes which are present and unchanged.  No new ulceration or infection.  No fevers or chills.   Past Medical History:  Diagnosis Date  . Asthma   . Cancer ([place])    kidney  . Hypertension     Past Surgical History:  Procedure Laterality Date  . ABDOMINAL HYSTERECTOMY       Family History  Problem Relation Age of Onset  . Colon cancer Mother   . Hypertension Mother   . Heart attack Father 38  . Hypertension Father   . Hyperlipidemia Father   . Diabetes Father   . Stroke Neg Hx   . Breast cancer Neg Hx     Social History   Tobacco Use  . Smoking status: Never Smoker  . Smokeless tobacco: Never Used  Substance Use Topics  . Alcohol use: No  . Drug use: No     Allergies  Allergen Reactions  . Amlodipine Swelling    Other reaction(s): Angioedema  . Shellfish Allergy Anaphylaxis  . Thiazide-Type Diuretics Other (See Comments)    Other reaction(s): Other (See Comments)  . Penicillins Hives and Other (See Comments)    Has patient had a PCN reaction causing immediate rash, facial/tongue/throat swelling, SOB or lightheadedness with hypotension: no Has patient had a PCN reaction causing severe rash involving mucus membranes or skin necrosis: no Has patient had a PCN reaction that required hospitalization? no Has patient had a PCN reaction occurring within the last 10 years: no If all of the above answers are "NO", then may proceed with Cephalosporin use.     Current Outpatient Medications  Medication Sig Dispense Refill  . albuterol (PROVENTIL HFA;VENTOLIN HFA) 108 (90 BASE)  MCG/ACT inhaler Inhale 2 puffs into the lungs every 6 (six) hours as needed for wheezing or shortness of breath. 1 Inhaler 0  . aspirin EC 81 MG tablet Take 81 mg by mouth daily.    . Butalbital-APAP-Caffeine 50-300-40 MG CAPS take 1 to 2 capsules by mouth every 6 hours if needed for headache    . Elastic Bandages & Supports (EMS ANTI-EMBOLISM STOCKINGS) MISC Apply in the morning and remove at night.    Marland Kitchen lisinopril (PRINIVIL,ZESTRIL) 40 MG tablet Take 1 tablet by mouth daily.  0  . meclizine (ANTIVERT) 12.5 MG tablet Take 1 tablet (12.5 mg total) by mouth 3 (three) times daily as needed for dizziness or nausea. 30 tablet 0  . potassium chloride SA (K-DUR,KLOR-CON) 20 MEQ tablet Take 20 mEq by mouth daily.     . baclofen (LIORESAL) 10 MG tablet Take 0.5-1 tablets (5-10 mg total) by mouth 3 (three) times daily as needed for muscle spasms. (Patient not taking: Reported on 10/03/2018) 30 each 0  . doxycycline (VIBRA-TABS) 100 MG tablet Take 1 tablet (100 mg total) by mouth 2 (two) times daily. (Patient not taking: Reported on 10/03/2018) 20 tablet 0  . silver sulfADIAZINE (SILVADENE) 1 % cream Apply 1 application topically 2 (two) times daily. (Patient not taking: Reported on 10/03/2018) 50 g 0  . traMADol (ULTRAM) 50 MG tablet  Take 1 tablet (50 mg total) by mouth every 12 (twelve) hours as needed for severe pain. (Patient not taking: Reported on 10/03/2018) 10 tablet 0   No current facility-administered medications for this visit.     Review of Systems: Negative Unless Checked Constitutional: [] ?Weight loss[] ?Fever[] ?Chills Cardiac:[] ?Chest pain[] ? Atrial Fibrillation[] ?Palpitations [] ?Shortness of breath when laying flat [] ?Shortness of breath with exertion. [] ?Shortness of breath at rest Vascular: [] ?Pain in legs with walking[] ?Pain in legswith standing[] ?Pain in legs when laying flat [] ?Claudication  [] ?Pain in feet when laying flat [] ?History of DVT [] ?Phlebitis  [x] ?Swelling in legs [] ?Varicose veins [] ?Non-healing ulcers Pulmonary: [] ?Uses home oxygen [] ?Productive cough[] ?Hemoptysis [] ?Wheeze [] ?COPD [x] ?Asthma Neurologic: [] ?Dizziness[] ?Seizures [] ?Blackouts[] ?History of stroke [] ?History of TIA[] ?Aphasia [] ?Temporary Blindness[] ?Weaknessor numbness in arm [] ?Weakness or numbnessin leg Musculoskeletal:[] ?Joint swelling [] ?Joint pain [] ?Low back pain  [] ? History of Knee Replacement [] ?Arthritis [] ?back Surgeries[] ? Spinal Stenosis  Hematologic:[] ?Easy bruising[] ?Easy bleeding [] ?Hypercoagulable state [] ?Anemic Gastrointestinal:[] ?Diarrhea [] ?Vomiting[] ?Gastroesophageal reflux/heartburn[] ?Difficulty swallowing. [] ?Abdominal pain Genitourinary: [] ?Chronic kidney disease [] ?Difficulturination [] ?Anuric[] ?Blood in urine [] ?Frequenturination [] ?Burning with urination[] ?Hematuria Skin: [] ?Rashes [] ?Ulcers [] ?Wounds Psychological: [x] ?History of anxiety[] ?History of major depression  [] ? Memory Difficulties    Physical Exam BP 135/78 (BP Location: Right Arm)   Pulse 72   Resp 16   Wt 210 lb (95.3 kg)   BMI 31.01 kg/m  Gen:  WD/WN, NAD Head: /AT, No temporalis wasting.  Ear/Nose/Throat: Hearing grossly intact, nares w/o erythema or drainage, oropharynx w/o Erythema/Exudate Eyes: Conjunctiva clear, sclera non-icteric  Neck: trachea midline.  No JVD.  Pulmonary:  Good air movement, respirations not labored, no use of accessory muscles  Cardiac: RRR, no JVD Vascular:  Vessel Right Left  Radial Palpable Palpable                                   Gastrointestinal:. No masses, surgical incisions, or scars. Musculoskeletal: M/S 5/5 throughout.  Extremities without ischemic changes.  No deformity or atrophy.  1+ bilateral lower extremity edema.  Moderate stasis dermatitis changes are present bilaterally Neurologic: Sensation grossly intact in extremities.   Symmetrical.  Speech is fluent. Motor exam as listed above. Psychiatric: Judgment intact, Mood & affect appropriate for pt's clinical situation. Dermatologic: No rashes or ulcers noted.  No cellulitis or open wounds.    Radiology No results found.  Labs No results found for this or any previous visit (from the past 2160 hour(s)).  Assessment/Plan:  Essential hypertension blood pressure control important in reducing the progression of atherosclerotic disease. On appropriate oral medications.   Swelling of limb Symptoms are currently under pretty good control.  Continue conservative therapies with compression stockings, leg elevation, and increasing her activity.  I will plan a 76-month follow-up but if she has worsening symptoms in the interim she will contact our office.      Leotis Pain 01/06/2019, 3:46 PM   This note was created with Dragon medical transcription system.  Any errors from dictation are unintentional.

## 2019-01-06 NOTE — Assessment & Plan Note (Signed)
blood pressure control important in reducing the progression of atherosclerotic disease. On appropriate oral medications.  

## 2019-01-06 NOTE — Assessment & Plan Note (Signed)
Symptoms are currently under pretty good control.  Continue conservative therapies with compression stockings, leg elevation, and increasing her activity.  I will plan a 53-month follow-up but if she has worsening symptoms in the interim she will contact our office.

## 2019-01-24 ENCOUNTER — Encounter: Payer: Self-pay | Admitting: Emergency Medicine

## 2019-01-24 ENCOUNTER — Other Ambulatory Visit: Payer: Self-pay

## 2019-01-24 ENCOUNTER — Ambulatory Visit
Admission: EM | Admit: 2019-01-24 | Discharge: 2019-01-24 | Disposition: A | Discharge status: Home / Self Care | Payer: Medicare Other | Attending: Family Medicine | Admitting: Family Medicine

## 2019-01-24 DIAGNOSIS — R350 Frequency of micturition: Secondary | ICD-10-CM | POA: Insufficient documentation

## 2019-01-24 DIAGNOSIS — S39012A Strain of muscle, fascia and tendon of lower back, initial encounter: Secondary | ICD-10-CM | POA: Diagnosis not present

## 2019-01-24 LAB — URINALYSIS, COMPLETE (UACMP) WITH MICROSCOPIC
Bacteria, UA: NONE SEEN
Bilirubin Urine: NEGATIVE
Glucose, UA: NEGATIVE mg/dL
Hgb urine dipstick: NEGATIVE
Ketones, ur: NEGATIVE mg/dL
Leukocytes,Ua: NEGATIVE
Nitrite: NEGATIVE
Protein, ur: NEGATIVE mg/dL
Specific Gravity, Urine: 1.015 (ref 1.005–1.030)
pH: 5.5 (ref 5.0–8.0)

## 2019-01-24 NOTE — Discharge Instructions (Signed)
Heat to low back area, re-start muscle relaxer from home Increase water Follow up if symptoms get worse

## 2019-01-24 NOTE — ED Triage Notes (Signed)
Pt c/o urinary frequency, weakness and lightheadedness. Started about 3 days ago. She has also had lower back pain on the left side. She states that she has known kidney cancer on the left side.

## 2019-01-25 NOTE — ED Provider Notes (Signed)
[facility]    CSN: [id number] Arrival date & time: 01/24/19  1522      History   Chief Complaint Chief Complaint  Patient presents with  . Urinary Frequency    HPI Janet [name] is a 69 y.o. female.   69 yo female with a c/o frequent urination for the past 2 days. Also c/o left sided low back pain. Denies any dysuria, hematuria, fevers, chills, incontinence or retention, numbness/tingling. States she drinks a lot of water.    Urinary Frequency    Past Medical History:  Diagnosis Date  . Asthma   . Cancer ([place])    kidney  . Hypertension     Patient Active Problem List   Diagnosis Date Noted  . Numbness and tingling of left lower extremity 06/23/2018  . Essential hypertension 05/02/2018  . Swelling of limb 05/02/2018  . Varicose veins with ulcer, left ([place]) 05/02/2018  . Left hip pain 11/01/2017  . Headache disorder 08/29/2017  . Premature ventricular contractions 05/25/2016  . Asthma in adult without complication [id number]  . Heart palpitations 04/23/2016  . History of kidney cancer 04/23/2016  . Bilateral cellulitis of lower leg 10/03/2015  . Renal mass, left 02/24/2015  . Thrombophlebitis 09/29/2014  . Chest pressure 09/12/2014  . Dyspnea on exertion 09/12/2014  . Generalized weakness 09/09/2014  . 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 ABDOMINAL HYSTERECTOMY      OB History    Gravida  3   Para      Term      Preterm      AB      Living  2     SAB      TAB      Ectopic      Multiple      Live Births               Home Medications    Prior to Admission medications   Medication Sig Start Date End Date Taking? Authorizing Provider  aspirin EC 81 MG tablet Take 81 mg by mouth daily.   Yes [provider]  lisinopril (PRINIVIL,ZESTRIL) 40 MG tablet Take 1 tablet by mouth daily. 08/27/14  Yes [provider]  meclizine (ANTIVERT) 12.5 MG tablet Take  1 tablet (12.5 mg total) by mouth 3 (three) times daily as needed for dizziness or nausea. 04/15/17  Yes Delman Kitten, MD  potassium chloride SA (K-DUR,KLOR-CON) 20 MEQ tablet Take 20 mEq by mouth daily.    Yes [provider]  albuterol (PROVENTIL HFA;VENTOLIN HFA) 108 (90 BASE) MCG/ACT inhaler Inhale 2 puffs into the lungs every 6 (six) hours as needed for wheezing or shortness of breath. 12/24/14   [name], MD  baclofen (LIORESAL) 10 MG tablet Take 0.5-1 tablets (5-10 mg total) by mouth 3 (three) times daily as needed for muscle spasms. Patient not taking: Reported on 10/03/2018 09/30/18   Coral Spikes, DO  Butalbital-APAP-Caffeine 50-300-40 MG CAPS take 1 to 2 capsules by mouth every 6 hours if needed for headache 07/30/17   [provider]  doxycycline (VIBRA-TABS) 100 MG tablet Take 1 tablet (100 mg total) by mouth 2 (two) times daily. Patient not taking: Reported on 10/03/2018 09/19/18   Kris Hartmann, NP  Elastic Bandages & Supports (EMS ANTI-EMBOLISM STOCKINGS) MISC Apply in the morning and remove at night. 06/23/14   [provider]  silver sulfADIAZINE (SILVADENE) 1 % cream Apply 1  application topically 2 (two) times daily. Patient not taking: Reported on 10/03/2018 04/27/18   Norval Gable, MD  traMADol (ULTRAM) 50 MG tablet Take 1 tablet (50 mg total) by mouth every 12 (twelve) hours as needed for severe pain. Patient not taking: Reported on 10/03/2018 09/30/18   Coral Spikes, DO  gabapentin (NEURONTIN) 300 MG capsule Take 1 capsule (300 mg total) by mouth at bedtime. 05/30/18 09/30/18  Kris Hartmann, NP    Family History Family History  Problem Relation Age of Onset  . Colon cancer Mother   . Hypertension Mother   . Heart attack Father 68  . Hypertension Father   . Hyperlipidemia Father   . Diabetes Father   . Stroke Neg Hx   . Breast cancer Neg Hx     Social History Social History   Tobacco Use  . Smoking status: Never Smoker  . Smokeless  tobacco: Never Used  Substance Use Topics  . Alcohol use: No  . Drug use: No     Allergies   Amlodipine, Shellfish allergy, Thiazide-type diuretics, and Penicillins   Review of Systems Review of Systems  Genitourinary: Positive for frequency.     Physical Exam Triage Vital Signs ED Triage Vitals  Enc Vitals Group     BP 01/24/19 1535 137/67     Pulse Rate 01/24/19 1535 76     Resp 01/24/19 1535 18     Temp 01/24/19 1535 98.2 F (36.8 C)     Temp Source 01/24/19 1535 Oral     SpO2 01/24/19 1535 100 %     Weight 01/24/19 1531 212 lb (96.2 kg)     Height 01/24/19 1531 5\' 9"  (1.753 m)     Head Circumference --      Peak Flow --      Pain Score 01/24/19 1531 0     Pain Loc --      Pain Edu? --      Excl. in [place]? --    No data found.  Updated Vital Signs BP 137/67 (BP Location: Right Arm)   Pulse 76   Temp 98.2 F (36.8 C) (Oral)   Resp 18   Ht 5\' 9"  (1.753 m)   Wt 96.2 kg   SpO2 100%   BMI 31.31 kg/m   Visual Acuity Right Eye Distance:   Left Eye Distance:   Bilateral Distance:    Right Eye Near:   Left Eye Near:    Bilateral Near:     Physical Exam Vitals and nursing note reviewed.  Constitutional:      General: She is not in acute distress.    Appearance: She is not toxic-appearing or diaphoretic.  Abdominal:     General: There is no distension.     Palpations: Abdomen is soft.     Tenderness: There is no abdominal tenderness.  Musculoskeletal:     Lumbar back: Spasms and tenderness (left lumbar paraspinous muscles) present. No swelling, edema, deformity, signs of trauma, lacerations or bony tenderness.  Neurological:     Mental Status: She is alert.      UC Treatments / Results  Labs (all labs ordered are listed, but only abnormal results are displayed) Labs Reviewed  URINALYSIS, COMPLETE (UACMP) WITH MICROSCOPIC    EKG   Radiology No results found.  Procedures Procedures (including critical care time)  Medications Ordered in  UC Medications - No data to display  Initial Impression / Assessment and Plan / UC Course  I have  reviewed the triage vital signs and the nursing notes.  Pertinent labs & imaging results that were available during my care of the patient were reviewed by me and considered in my medical decision making (see chart for details).      Final Clinical Impressions(s) / UC Diagnoses   Final diagnoses:  Urinary frequency  Strain of lumbar region, initial encounter     Discharge Instructions     Heat to low back area, re-start muscle relaxer from home Increase water Follow up if symptoms get worse     ED Prescriptions    None     1. Lab results and diagnosis reviewed with patient 2. Recommend supportive treatment as above; patient states she has muscle relaxer at home 3. Follow-up prn if symptoms worsen or don't improve   PDMP not reviewed this encounter.   Norval Gable, MD 01/25/19 1743

## 2019-02-12 ENCOUNTER — Ambulatory Visit
Admission: RE | Admit: 2019-02-12 | Discharge: 2019-02-12 | Disposition: A | Discharge status: Home / Self Care | Payer: Medicare Other | Source: Ambulatory Visit | Attending: Family Medicine | Admitting: Family Medicine

## 2019-02-12 ENCOUNTER — Other Ambulatory Visit: Payer: Self-pay | Admitting: Family Medicine

## 2019-02-12 DIAGNOSIS — R05 Cough: Secondary | ICD-10-CM | POA: Diagnosis not present

## 2019-02-12 DIAGNOSIS — R059 Cough, unspecified: Secondary | ICD-10-CM

## 2019-02-17 ENCOUNTER — Encounter: Payer: Self-pay | Admitting: *Deleted

## 2019-03-05 ENCOUNTER — Ambulatory Visit: Payer: Medicare Other | Admitting: Gastroenterology

## 2019-05-06 ENCOUNTER — Ambulatory Visit (INDEPENDENT_AMBULATORY_CARE_PROVIDER_SITE_OTHER): Payer: Medicare Other | Admitting: Gastroenterology

## 2019-05-06 ENCOUNTER — Other Ambulatory Visit: Payer: Self-pay

## 2019-05-06 ENCOUNTER — Encounter: Payer: Self-pay | Admitting: Gastroenterology

## 2019-05-06 VITALS — BP 136/79 | HR 83 | Temp 97.0°F | Ht 69.0 in | Wt 212.4 lb

## 2019-05-06 DIAGNOSIS — Z1211 Encounter for screening for malignant neoplasm of colon: Secondary | ICD-10-CM

## 2019-05-06 DIAGNOSIS — R1013 Epigastric pain: Secondary | ICD-10-CM | POA: Diagnosis not present

## 2019-05-06 DIAGNOSIS — K5909 Other constipation: Secondary | ICD-10-CM | POA: Diagnosis not present

## 2019-05-06 MED ORDER — MAGNESIUM CITRATE PO SOLN: 1.0000 | Freq: Once | ORAL | 0 refills | Status: AC

## 2019-05-06 NOTE — Patient Instructions (Signed)

## 2019-05-06 NOTE — Progress Notes (Signed)
Janet Darby, MD [address]  [place]  [place], [place] [address]  Main: [phone]  Fax: [phone]    Gastroenterology Consultation  Referring Provider:     Marguerita Merles, MD Primary Care Physician:  Marguerita Merles, MD Primary Gastroenterologist:  Dr. Cephas [name] Reason for Consultation:   Chronic constipation, dyspepsia        HPI:   Janet [name] is a 69 y.o. female referred by Dr. Lennox Grumbles, Connye Burkitt, MD  for consultation & management of chronic constipation, dyspepsia.  Patient reports she has been experiencing consisting of irregular bowel movements with significant straining.  She has been improved use as needed.  Stools normal bowel movements with incomplete emptying.  She denies rectal bleeding, abdominal pain. Stools are generally soft which she takes to stop.  She acknowledges drinking several cans of Pepsi daily.  Not drink water.  She also reports epigastric discomfort, gas, bloating and belching, sometimes regurgitation.  She denies heartburn.  She denies weight loss.  Her labs from last year including CBC, CMP were unremarkable.  TSH normal.  She is found to have renal lesions consistent with carcinoma based on imaging, closely followed by urology at [facility]  She does not drink alcohol, does not smoke S/p hysterectomy Patient reports undergoing stool card test for colon cancer screening annually  NSAIDs: None  Antiplts/Anticoagulants/Anti thrombotics: None  GI Procedures: None Patient reports her mother passed away from colon cancer in her 66s  Past Medical History:  Diagnosis Date  . Asthma   . Cancer ([place])    kidney  . Hypertension     Past Surgical History:  Procedure Laterality Date  . ABDOMINAL HYSTERECTOMY      Current Outpatient Medications:  .  albuterol (PROVENTIL HFA;VENTOLIN HFA) 108 (90 BASE) MCG/ACT inhaler, Inhale 2 puffs into the lungs every 6 (six) hours as needed for wheezing or shortness of breath., Disp: 1 Inhaler, Rfl: 0 .   aspirin EC 81 MG tablet, Take 81 mg by mouth daily., Disp: , Rfl:  .  baclofen (LIORESAL) 10 MG tablet, Take 0.5-1 tablets (5-10 mg total) by mouth 3 (three) times daily as needed for muscle spasms., Disp: 30 each, Rfl: 0 .  Butalbital-APAP-Caffeine 50-300-40 MG CAPS, take 1 to 2 capsules by mouth every 6 hours if needed for headache, Disp: , Rfl:  .  doxycycline (VIBRA-TABS) 100 MG tablet, Take 1 tablet (100 mg total) by mouth 2 (two) times daily., Disp: 20 tablet, Rfl: 0 .  Elastic Bandages & Supports (EMS ANTI-EMBOLISM STOCKINGS) MISC, Apply in the morning and remove at night., Disp: , Rfl:  .  lisinopril (PRINIVIL,ZESTRIL) 40 MG tablet, Take 1 tablet by mouth daily., Disp: , Rfl: 0 .  meclizine (ANTIVERT) 12.5 MG tablet, Take 1 tablet (12.5 mg total) by mouth 3 (three) times daily as needed for dizziness or nausea., Disp: 30 tablet, Rfl: 0 .  potassium chloride SA (K-DUR,KLOR-CON) 20 MEQ tablet, Take 20 mEq by mouth daily. , Disp: , Rfl:  .  silver sulfADIAZINE (SILVADENE) 1 % cream, Apply 1 application topically 2 (two) times daily., Disp: 50 g, Rfl: 0 .  traMADol (ULTRAM) 50 MG tablet, Take 1 tablet (50 mg total) by mouth every 12 (twelve) hours as needed for severe pain., Disp: 10 tablet, Rfl: 0   Family History  Problem Relation Age of Onset  . Colon cancer Mother   . Hypertension Mother   . Heart attack Father 57  . Hypertension Father   .  Hyperlipidemia Father   . Diabetes Father   . Stroke Neg Hx   . Breast cancer Neg Hx      Social History   Tobacco Use  . Smoking status: Never Smoker  . Smokeless tobacco: Never Used  Substance Use Topics  . Alcohol use: No  . Drug use: No    Allergies as of 05/06/2019 - Review Complete 05/06/2019  Allergen Reaction Noted  . Amlodipine Swelling 02/21/2016  . Shellfish allergy Anaphylaxis 12/24/2014  . Thiazide-type diuretics Other (See Comments) 03/05/2012  . Penicillins Hives and Other (See Comments) 09/15/2014    Review of  Systems:    All systems reviewed and negative except where noted in HPI.   Physical Exam:  BP 136/79 (BP Location: Left Arm, Patient Position: Sitting, Cuff Size: Normal)   Pulse 83   Temp (!) 97 F (36.1 C) (Oral)   Ht 5\' 9"  (1.753 m)   Wt 212 lb 6 oz (96.3 kg)   BMI 31.36 kg/m  No LMP recorded. Patient has had a hysterectomy.  General:   Alert,  Well-developed, well-nourished, pleasant and cooperative in NAD Head:  Normocephalic and atraumatic. Eyes:  Sclera clear, no icterus.   Conjunctiva pink. Ears:  Normal auditory acuity. Nose:  No deformity, discharge, or lesions. Mouth:  No deformity or lesions,oropharynx pink & moist. Neck:  Supple; no masses or thyromegaly. Lungs:  Respirations even and unlabored.  Clear throughout to auscultation.   No wheezes, crackles, or rhonchi. No acute distress. Heart:  Regular rate and rhythm; no murmurs, clicks, rubs, or gallops. Abdomen:  Normal bowel sounds. Soft, obese, mild lower abdominal tenderness, worse in the lower quadrant and non-distended without masses, hepatosplenomegaly or hernias noted.  No guarding or rebound tenderness.   Rectal: Not performed Msk:  Symmetrical without gross deformities. Good, equal movement & strength bilaterally. Pulses:  Normal pulses noted. Extremities:  No clubbing or edema.  No cyanosis. Neurologic:  Alert and oriented x3;  grossly normal neurologically. Skin:  Intact without significant lesions or rashes. No jaundice. Psych:  Alert and cooperative. Normal mood and affect.  Imaging Studies: Reviewed  Assessment and Plan:   Janet [name] is a 69 y.o. female with BMI 35, hypertension is seen in consultation for chronic constipation and dyspepsia.  Chronic constipation: Most likely secondary to her lifestyle and eating habits Strongly advised her to completely eliminate carbonated beverages Discussed about high-fiber diet, information provided Trial of Linzess 145 MCG daily, samples provided   Colon  cancer screening Discussed with patient about various screening modalities including colonoscopy and other noninvasive testing.  Told her about the risks and benefits of colonoscopy and she is willing to undergo  Dyspepsia: Secondary to carbonated beverages Advised her to completely stop drinking sodas Symptoms are persistent, will evaluate for H. pylori infection  Follow up in 3 months   Janet Darby, MD

## 2019-05-06 NOTE — Addendum Note (Signed)
Addended by: Ulyess Blossom L on: 05/06/2019 02:06 PM   Modules accepted: Orders

## 2019-05-08 ENCOUNTER — Emergency Department: Payer: Medicare Other

## 2019-05-08 ENCOUNTER — Emergency Department
Admission: EM | Admit: 2019-05-08 | Discharge: 2019-05-09 | Disposition: A | Discharge status: Home / Self Care | Payer: Medicare Other | Attending: Student in an Organized Health Care Education/Training Program | Admitting: Student in an Organized Health Care Education/Training Program

## 2019-05-08 ENCOUNTER — Other Ambulatory Visit: Payer: Self-pay

## 2019-05-08 DIAGNOSIS — Y999 Unspecified external cause status: Secondary | ICD-10-CM | POA: Insufficient documentation

## 2019-05-08 DIAGNOSIS — Z7982 Long term (current) use of aspirin: Secondary | ICD-10-CM | POA: Insufficient documentation

## 2019-05-08 DIAGNOSIS — R0602 Shortness of breath: Secondary | ICD-10-CM | POA: Diagnosis not present

## 2019-05-08 DIAGNOSIS — Z85528 Personal history of other malignant neoplasm of kidney: Secondary | ICD-10-CM | POA: Diagnosis not present

## 2019-05-08 DIAGNOSIS — R042 Hemoptysis: Secondary | ICD-10-CM

## 2019-05-08 DIAGNOSIS — W19XXXA Unspecified fall, initial encounter: Secondary | ICD-10-CM | POA: Insufficient documentation

## 2019-05-08 DIAGNOSIS — Y929 Unspecified place or not applicable: Secondary | ICD-10-CM | POA: Insufficient documentation

## 2019-05-08 DIAGNOSIS — Z79899 Other long term (current) drug therapy: Secondary | ICD-10-CM | POA: Diagnosis not present

## 2019-05-08 DIAGNOSIS — S8002XA Contusion of left knee, initial encounter: Secondary | ICD-10-CM | POA: Diagnosis not present

## 2019-05-08 DIAGNOSIS — Y939 Activity, unspecified: Secondary | ICD-10-CM | POA: Insufficient documentation

## 2019-05-08 DIAGNOSIS — M25562 Pain in left knee: Secondary | ICD-10-CM

## 2019-05-08 DIAGNOSIS — S8992XA Unspecified injury of left lower leg, initial encounter: Secondary | ICD-10-CM | POA: Diagnosis present

## 2019-05-08 DIAGNOSIS — I10 Essential (primary) hypertension: Secondary | ICD-10-CM | POA: Insufficient documentation

## 2019-05-08 DIAGNOSIS — R5381 Other malaise: Secondary | ICD-10-CM | POA: Diagnosis not present

## 2019-05-08 DIAGNOSIS — J45909 Unspecified asthma, uncomplicated: Secondary | ICD-10-CM | POA: Insufficient documentation

## 2019-05-08 LAB — BASIC METABOLIC PANEL
Anion gap: 10 (ref 5–15)
BUN: 15 mg/dL (ref 8–23)
CO2: 21 mmol/L — ABNORMAL LOW (ref 22–32)
Calcium: 9.7 mg/dL (ref 8.9–10.3)
Chloride: 108 mmol/L (ref 98–111)
Creatinine, Ser: 0.95 mg/dL (ref 0.44–1.00)
GFR calc Af Amer: >60 mL/min (ref >60)
GFR calc non Af Amer: >60 mL/min (ref >60)
Glucose, Bld: 96 mg/dL (ref 70–99)
Potassium: 3.9 mmol/L (ref 3.5–5.1)
Sodium: 139 mmol/L (ref 135–145)

## 2019-05-08 LAB — URINALYSIS, COMPLETE (UACMP) WITH MICROSCOPIC
Bilirubin Urine: NEGATIVE
Glucose, UA: NEGATIVE mg/dL
Hgb urine dipstick: NEGATIVE
Ketones, ur: NEGATIVE mg/dL
Leukocytes,Ua: NEGATIVE
Nitrite: NEGATIVE
Protein, ur: NEGATIVE mg/dL
Specific Gravity, Urine: 1.019 (ref 1.005–1.030)
pH: 5 (ref 5.0–8.0)

## 2019-05-08 LAB — CBC
HCT: 39.1 % (ref 36.0–46.0)
Hemoglobin: 12.5 g/dL (ref 12.0–15.0)
MCH: 28.6 pg (ref 26.0–34.0)
MCHC: 32 g/dL (ref 30.0–36.0)
MCV: 89.5 fL (ref 80.0–100.0)
Platelets: 307 10*3/uL (ref 150–400)
RBC: 4.37 MIL/uL (ref 3.87–5.11)
RDW: 12.8 % (ref 11.5–15.5)
WBC: 7 10*3/uL (ref 4.0–10.5)
nRBC: 0 % (ref 0.0–0.2)

## 2019-05-08 LAB — TROPONIN I (HIGH SENSITIVITY): Troponin I (High Sensitivity): <2 ng/L (ref <18)

## 2019-05-08 NOTE — ED Triage Notes (Signed)
Patient reports ongoing weakness for several months with gradually increasing severity. Patient reports coughing up blood in the morning for several months. Patient reports mechanical fall today, patient c/o right knee pain.

## 2019-05-08 NOTE — ED Notes (Signed)
Assessment: pt states she has been coughing blood in the mornings sometimes for months. Pt also complains of falling today in carport after tripping injuring left knee. Pt with pain on palpation to left knee.

## 2019-05-08 NOTE — ED Notes (Signed)
Pt updated on delay

## 2019-05-08 NOTE — ED Notes (Signed)
Attempt iv insertion x1 without success.

## 2019-05-09 ENCOUNTER — Emergency Department: Payer: Medicare Other

## 2019-05-09 ENCOUNTER — Encounter: Payer: Self-pay | Admitting: Radiology

## 2019-05-09 DIAGNOSIS — S8002XA Contusion of left knee, initial encounter: Secondary | ICD-10-CM | POA: Diagnosis not present

## 2019-05-09 LAB — TROPONIN I (HIGH SENSITIVITY): Troponin I (High Sensitivity): 3 ng/L (ref <18)

## 2019-05-09 MED ORDER — IOHEXOL 350 MG/ML SOLN
75.0000 mL | Freq: Once | INTRAVENOUS | Status: AC | PRN
  Administered 2019-05-09: 75 mL via INTRAVENOUS

## 2019-05-09 MED ORDER — PREDNISONE 20 MG PO TABS: 40.0000 mg | ORAL_TABLET | Freq: Every day | ORAL | 0 refills | Status: DC

## 2019-05-09 MED ORDER — ALBUTEROL SULFATE HFA 108 (90 BASE) MCG/ACT IN AERS: 2.0000 | INHALATION_SPRAY | Freq: Four times a day (QID) | RESPIRATORY_TRACT | 1 refills | Status: DC | PRN

## 2019-05-09 MED ORDER — TRAMADOL HCL 50 MG PO TABS: 50.0000 mg | ORAL_TABLET | Freq: Two times a day (BID) | ORAL | 0 refills | Status: DC | PRN

## 2019-05-09 NOTE — ED Provider Notes (Signed)
[facility] Emergency Department Provider Note    First MD Initiated Contact with Patient 05/08/19 2225     (approximate)  I have reviewed the triage vital signs and the nursing notes.   HISTORY  Chief Complaint Weakness, Hemoptysis, and Fall    HPI Janet [name] is a 69 y.o. female presents to the ER for evaluation of left knee pain that occurred after mechanical fall yesterday.  Patient was also concerned about several weeks even months of generalized malaise shortness of breath and intermittently coughing up blood.  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  She is not currently taking.  She denies any active chest pain at this time.  Does take a baby aspirin.  She denies any recent fevers.  Does not smoke.  Does have remote history of asthma.    Past Medical History:  Diagnosis Date  . Asthma   . Cancer ([place])    kidney  . Hypertension    Family History  Problem Relation Age of Onset  . Colon cancer Mother   . Hypertension Mother   . Heart attack Father 44  . Hypertension Father   . Hyperlipidemia Father   . Diabetes Father   . Stroke Neg Hx   . Breast cancer Neg Hx    Past Surgical History:  Procedure Laterality Date  . ABDOMINAL HYSTERECTOMY     Patient Active Problem List   Diagnosis Date Noted  . Numbness and tingling of left lower extremity 06/23/2018  . Essential hypertension 05/02/2018  . Swelling of limb 05/02/2018  . 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 Left hip pain 11/01/2017  . Headache disorder 08/29/2017  . Premature ventricular contractions 05/25/2016  . Asthma in adult without complication [id number]  . Heart palpitations 04/23/2016  . History of kidney cancer 04/23/2016  . Bilateral cellulitis of lower leg 10/03/2015  . Renal mass, left 02/24/2015  . Thrombophlebitis 09/29/2014  . Chest pressure 09/12/2014  . Dyspnea on exertion 09/12/2014  . Generalized weakness  09/09/2014  . 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      Prior to Admission medications   Medication Sig Start Date End Date Taking? Authorizing Provider  albuterol (PROVENTIL HFA;VENTOLIN HFA) 108 (90 BASE) MCG/ACT inhaler Inhale 2 puffs into the lungs every 6 (six) hours as needed for wheezing or shortness of breath. 12/24/14   [name], MD  albuterol (VENTOLIN HFA) 108 (90 Base) MCG/ACT inhaler Inhale 2 puffs into the lungs every 6 (six) hours as needed for wheezing or shortness of breath. 05/09/19   Merlyn Lot, MD  aspirin EC 81 MG tablet Take 81 mg by mouth daily.    [provider]  baclofen (LIORESAL) 10 MG tablet Take 0.5-1 tablets (5-10 mg total) by mouth 3 (three) times daily as needed for muscle spasms. 09/30/18   Coral Spikes, DO  Butalbital-APAP-Caffeine 50-300-40 MG CAPS take 1 to 2 capsules by mouth every 6 hours if needed for headache 07/30/17   [provider]  doxycycline (VIBRA-TABS) 100 MG tablet Take 1 tablet (100 mg total) by mouth 2 (two) times daily. 09/19/18   Kris Hartmann, NP  Elastic Bandages & Supports (EMS ANTI-EMBOLISM STOCKINGS) MISC Apply in the morning and remove at night. 06/23/14   [provider]  lisinopril (PRINIVIL,ZESTRIL) 40 MG tablet Take 1 tablet by mouth daily. 08/27/14   [provider]  meclizine (ANTIVERT) 12.5 MG tablet Take 1 tablet (12.5 mg total)  by mouth 3 (three) times daily as needed for dizziness or nausea. 04/15/17   Delman Kitten, MD  potassium chloride SA (K-DUR,KLOR-CON) 20 MEQ tablet Take 20 mEq by mouth daily.     [provider]  silver sulfADIAZINE (SILVADENE) 1 % cream Apply 1 application topically 2 (two) times daily. 04/27/18   Norval Gable, MD  traMADol (ULTRAM) 50 MG tablet Take 1 tablet (50 mg total) by mouth every 12 (twelve) hours as needed for severe pain. 05/09/19   Merlyn Lot, MD  gabapentin (NEURONTIN) 300 MG capsule Take 1 capsule (300 mg total) by mouth at bedtime.  05/30/18 09/30/18  Kris Hartmann, NP    Allergies Amlodipine, Shellfish allergy, Thiazide-type diuretics, and Penicillins    Social History Social History   Tobacco Use  . Smoking status: Never Smoker  . Smokeless tobacco: Never Used  Substance Use Topics  . Alcohol use: No  . Drug use: No    Review of Systems Patient denies headaches, rhinorrhea, blurry vision, numbness, shortness of breath, chest pain, edema, cough, abdominal pain, nausea, vomiting, diarrhea, dysuria, fevers, rashes or hallucinations unless otherwise stated above in HPI. ____________________________________________   PHYSICAL EXAM:  VITAL SIGNS: Vitals:   05/08/19 2300 05/09/19 0036  BP: 132/72 124/64  Pulse: 70 78  Resp:  19  Temp:  98.6 F (37 C)  SpO2: 100% 99%    Constitutional: Alert and oriented.  Eyes: Conjunctivae are normal.  Head: Atraumatic. Nose: No congestion/rhinnorhea. Mouth/Throat: Mucous membranes are moist.   Neck: No stridor. Painless ROM.  Cardiovascular: Normal rate, regular rhythm. Grossly normal heart sounds.  Good peripheral circulation. Respiratory: Normal respiratory effort.  No retractions. Lungs CTAB. Gastrointestinal: Soft and nontender. No distention. No abdominal bruits. No CVA tenderness. Genitourinary:  Musculoskeletal: ttp of left tibial tuberosity, no effusion or ligamentous instability.    No joint effusions. Neurologic:  Normal speech and language. No gross focal neurologic deficits are appreciated. No facial droop Skin:  Skin is warm, dry and intact. No rash noted. Psychiatric: Mood and affect are normal. Speech and behavior are normal.  ____________________________________________   LABS (all labs ordered are listed, but only abnormal results are displayed)  Results for orders placed or performed during the hospital encounter of 05/08/19 (from the past 24 hour(s))  Basic metabolic panel     Status: Abnormal   Collection Time: 05/08/19  8:37 PM    Result Value Ref Range   Sodium 139 135 - 145 mmol/L   Potassium 3.9 3.5 - 5.1 mmol/L   Chloride 108 98 - 111 mmol/L   CO2 21 (L) 22 - 32 mmol/L   Glucose, Bld 96 70 - 99 mg/dL   BUN 15 8 - 23 mg/dL   Creatinine, Ser 0.95 0.44 - 1.00 mg/dL   Calcium 9.7 8.9 - 10.3 mg/dL   GFR calc non Af Amer >60 >60 mL/min   GFR calc Af Amer >60 >60 mL/min   Anion gap 10 5 - 15  CBC     Status: None   Collection Time: 05/08/19  8:37 PM  Result Value Ref Range   WBC 7.0 4.0 - 10.5 K/uL   RBC 4.37 3.87 - 5.11 MIL/uL   Hemoglobin 12.5 12.0 - 15.0 g/dL   HCT 39.1 36.0 - 46.0 %   MCV 89.5 80.0 - 100.0 fL   MCH 28.6 26.0 - 34.0 pg   MCHC 32.0 30.0 - 36.0 g/dL   RDW 12.8 11.5 - 15.5 %   Platelets 307 150 -  400 K/uL   nRBC 0.0 0.0 - 0.2 %  Urinalysis, Complete w Microscopic     Status: Abnormal   Collection Time: 05/08/19  8:37 PM  Result Value Ref Range   Color, Urine YELLOW (A) YELLOW   APPearance CLEAR (A) CLEAR   Specific Gravity, Urine 1.019 1.005 - 1.030   pH 5.0 5.0 - 8.0   Glucose, UA NEGATIVE NEGATIVE mg/dL   Hgb urine dipstick NEGATIVE NEGATIVE   Bilirubin Urine NEGATIVE NEGATIVE   Ketones, ur NEGATIVE NEGATIVE mg/dL   Protein, ur NEGATIVE NEGATIVE mg/dL   Nitrite NEGATIVE NEGATIVE   Leukocytes,Ua NEGATIVE NEGATIVE   RBC / HPF 0-5 0 - 5 RBC/hpf   WBC, UA 0-5 0 - 5 WBC/hpf   Bacteria, UA RARE (A) NONE SEEN   Squamous Epithelial / LPF 0-5 0 - 5   Mucus PRESENT   Troponin I (High Sensitivity)     Status: None   Collection Time: 05/08/19  8:37 PM  Result Value Ref Range   Troponin I (High Sensitivity) <2 <18 ng/L  Troponin I (High Sensitivity)     Status: None   Collection Time: 05/08/19 11:13 PM  Result Value Ref Range   Troponin I (High Sensitivity) 3 <18 ng/L   ____________________________________________  EKG My review and personal interpretation at Time: 20:26   Indication: weakness  Rate: 75  Rhythm: sinus Axis: normal Other: nonspecific t wave abn, no change in  morphology as compared to previous ____________________________________________  RADIOLOGY  I personally reviewed all radiographic images ordered to evaluate for the above acute complaints and reviewed radiology reports and findings.  These findings were personally discussed with the patient.  Please see medical record for radiology report.  ____________________________________________   PROCEDURES  Procedure(s) performed:  Procedures    Critical Care performed: no ____________________________________________   INITIAL IMPRESSION / ASSESSMENT AND PLAN / ED COURSE  Pertinent labs & imaging results that were available during my care of the patient were reviewed by me and considered in my medical decision making (see chart for details).   DDX: ACS, pericarditis, esophagitis, boerhaaves, pe, dissection, pna, bronchitis, costochondritis   Janet [name] is a 69 y.o. who presents to the ED with symptoms as described above.  Patient nontoxic-appearing.  X-ray of left knee shows no evidence of fracture.  There is some contusion there.  Doubt tibial plateau fracture as she is able to ambulate on the knee.  Of more concern is the description of the shortness of breath and hemoptysis that she is describing.  May be related some bronchitis or asthma though I do not hear any significant wheezing.  She does have a history of DVT and she is no longer on any anticoagulation therefore will order CT angiogram.  Her EKG is unchanged from previous her troponins are nonischemic.  She is in sinus rhythm on the cardiac monitor.  Patient be signed out to oncoming physician, Dr. Jacklynn Bue pending follow-up on CTA and reassessment.     The patient was evaluated in Emergency Department today for the symptoms described in the history of present illness. He/she was evaluated in the context of the global COVID-19 pandemic, which necessitated consideration that the patient might be at risk for infection with the  SARS-CoV-2 virus that causes COVID-19. Institutional protocols and algorithms that pertain to the evaluation of patients at risk for COVID-19 are in a state of rapid change based on information released by regulatory bodies including the CDC and federal and state organizations. These  policies and algorithms were followed during the patient's care in the ED.  As part of my medical decision making, I reviewed the following data within the [place] notes reviewed and incorporated, Labs reviewed, notes from prior ED visits and [place] Controlled Substance Database   ____________________________________________   FINAL CLINICAL IMPRESSION(S) / ED DIAGNOSES  Final diagnoses:  Acute pain of left knee  Hemoptysis      NEW MEDICATIONS STARTED DURING THIS VISIT:  New Prescriptions   ALBUTEROL (VENTOLIN HFA) 108 (90 BASE) MCG/ACT INHALER    Inhale 2 puffs into the lungs every 6 (six) hours as needed for wheezing or shortness of breath.     Note:  This document was prepared using Dragon voice recognition software and may include unintentional dictation errors.    Merlyn Lot, MD 05/09/19 [phone]

## 2019-05-09 NOTE — ED Notes (Signed)
Pt to CT

## 2019-05-09 NOTE — ED Notes (Signed)
Iv team here 

## 2019-05-09 NOTE — ED Provider Notes (Signed)
-----------------------------------------   1:22 AM on 05/09/2019 -----------------------------------------  Patient care assumed from Dr. Quentin Cornwall.  Patient CTA is overall reassuring.  Negative for PE or acute infectious process.  Does have thyroid enlargement/nodule.  Patient states she is aware of this and has already had an ultrasound and they are following up on this.  Otherwise patient's work-up is reassuring.  We will discharge per Dr. Ruffin Frederick instructions with treatment for possible bronchitis.   Harvest Dark, MD 05/09/19 [phone]

## 2019-05-21 ENCOUNTER — Other Ambulatory Visit: Payer: Self-pay

## 2019-05-21 ENCOUNTER — Other Ambulatory Visit
Admission: RE | Admit: 2019-05-21 | Discharge: 2019-05-21 | Disposition: A | Discharge status: Home / Self Care | Payer: Medicare Other | Source: Ambulatory Visit | Attending: Gastroenterology | Admitting: Gastroenterology

## 2019-05-21 DIAGNOSIS — Z20822 Contact with and (suspected) exposure to covid-19: Secondary | ICD-10-CM | POA: Diagnosis not present

## 2019-05-21 DIAGNOSIS — Z01812 Encounter for preprocedural laboratory examination: Secondary | ICD-10-CM | POA: Diagnosis present

## 2019-05-21 LAB — SARS CORONAVIRUS 2 (TAT 6-24 HRS): SARS Coronavirus 2: NEGATIVE

## 2019-05-22 ENCOUNTER — Encounter: Payer: Self-pay | Admitting: Gastroenterology

## 2019-05-25 ENCOUNTER — Ambulatory Visit: Payer: Medicare Other | Admitting: Anesthesiology

## 2019-05-25 ENCOUNTER — Ambulatory Visit
Admission: RE | Admit: 2019-05-25 | Discharge: 2019-05-25 | Disposition: A | Discharge status: Home / Self Care | Payer: Medicare Other | Attending: Gastroenterology | Admitting: Gastroenterology

## 2019-05-25 ENCOUNTER — Encounter
Admission: RE | Disposition: A | Discharge status: Home / Self Care | Payer: Self-pay | Source: Home / Self Care | Attending: Gastroenterology

## 2019-05-25 ENCOUNTER — Other Ambulatory Visit: Payer: Self-pay

## 2019-05-25 DIAGNOSIS — D123 Benign neoplasm of transverse colon: Secondary | ICD-10-CM | POA: Diagnosis not present

## 2019-05-25 DIAGNOSIS — Z7982 Long term (current) use of aspirin: Secondary | ICD-10-CM | POA: Insufficient documentation

## 2019-05-25 DIAGNOSIS — K635 Polyp of colon: Secondary | ICD-10-CM

## 2019-05-25 DIAGNOSIS — K5730 Diverticulosis of large intestine without perforation or abscess without bleeding: Secondary | ICD-10-CM | POA: Diagnosis not present

## 2019-05-25 DIAGNOSIS — Z85528 Personal history of other malignant neoplasm of kidney: Secondary | ICD-10-CM | POA: Diagnosis not present

## 2019-05-25 DIAGNOSIS — Z79899 Other long term (current) drug therapy: Secondary | ICD-10-CM | POA: Insufficient documentation

## 2019-05-25 DIAGNOSIS — J45909 Unspecified asthma, uncomplicated: Secondary | ICD-10-CM | POA: Diagnosis not present

## 2019-05-25 DIAGNOSIS — I10 Essential (primary) hypertension: Secondary | ICD-10-CM | POA: Insufficient documentation

## 2019-05-25 DIAGNOSIS — Z1211 Encounter for screening for malignant neoplasm of colon: Secondary | ICD-10-CM | POA: Diagnosis not present

## 2019-05-25 HISTORY — PX: COLONOSCOPY WITH PROPOFOL: SHX5780

## 2019-05-25 SURGERY — COLONOSCOPY WITH PROPOFOL
Anesthesia: General

## 2019-05-25 MED ORDER — SODIUM CHLORIDE 0.9 % IV SOLN
INTRAVENOUS | Status: DC
  Administered 2019-05-25: 1000 mL via INTRAVENOUS

## 2019-05-25 MED ORDER — PROPOFOL 10 MG/ML IV BOLUS
INTRAVENOUS | Status: DC | PRN
  Administered 2019-05-25: 30 mg via INTRAVENOUS
  Administered 2019-05-25: 70 mg via INTRAVENOUS

## 2019-05-25 MED ORDER — LIDOCAINE 2% (20 MG/ML) 5 ML SYRINGE
INTRAMUSCULAR | Status: DC | PRN
  Administered 2019-05-25: 25 mg via INTRAVENOUS

## 2019-05-25 MED ORDER — PROPOFOL 500 MG/50ML IV EMUL
INTRAVENOUS | Status: DC | PRN
  Administered 2019-05-25: 120 ug/kg/min via INTRAVENOUS

## 2019-05-25 NOTE — Op Note (Signed)
[facility] Patient Name: Avion Patella Procedure Date: 05/25/2019 10:48 AM MRN: [id number] Account #: [id number] Date of Birth: [birth year] Admit Type: Outpatient Age: 69 Room: [facility] ENDO ROOM 1 Gender: Female Note Status: Finalized Procedure:             Colonoscopy Indications:           Screening for colorectal malignant neoplasm Providers:             Lin Landsman MD, MD Medicines:             Monitored Anesthesia Care Complications:         No immediate complications. Estimated blood loss: None. Procedure:             Pre-Anesthesia Assessment:                        - Prior to the procedure, a History and Physical was                         performed, and patient medications and allergies were                         reviewed. The patient is competent. The risks and                         benefits of the procedure and the sedation options and                         risks were discussed with the patient. All questions                         were answered and informed consent was obtained.                         Patient identification and proposed procedure were                         verified by the physician, the nurse, the                         anesthesiologist, the anesthetist and the technician                         in the pre-procedure area in the procedure room in the                         endoscopy suite. Mental Status Examination: alert and                         oriented. Airway Examination: normal oropharyngeal                         airway and neck mobility. Respiratory Examination:                         clear to auscultation. CV Examination: normal.                         Prophylactic Antibiotics: The patient does not  require                         prophylactic antibiotics. Prior Anticoagulants: The                         patient has taken no previous anticoagulant or                         antiplatelet  agents. ASA Grade Assessment: II - A                         patient with mild systemic disease. After reviewing                         the risks and benefits, the patient was deemed in                         satisfactory condition to undergo the procedure. The                         anesthesia plan was to use monitored anesthesia care                         (MAC). Immediately prior to administration of                         medications, the patient was re-assessed for adequacy                         to receive sedatives. The heart rate, respiratory                         rate, oxygen saturations, blood pressure, adequacy of                         pulmonary ventilation, and response to care were                         monitored throughout the procedure. The physical                         status of the patient was re-assessed after the                         procedure.                        After obtaining informed consent, the colonoscope was                         passed under direct vision. Throughout the procedure,                         the patient's blood pressure, pulse, and oxygen                         saturations were monitored continuously. The  Colonoscope was introduced through the anus and                         advanced to the the cecum, identified by appendiceal                         orifice and ileocecal valve. The colonoscopy was                         performed without difficulty. The patient tolerated                         the procedure well. The quality of the bowel                         preparation was evaluated using the BBPS [facility] Bowel                         Preparation Scale) with scores of: Right Colon = 3,                         Transverse Colon = 3 and Left Colon = 3 (entire mucosa                         seen well with no residual staining, small fragments                         of stool or opaque liquid). The  total BBPS score                         equals 9. Findings:      The perianal and digital rectal examinations were normal. Pertinent       negatives include normal sphincter tone and no palpable rectal lesions.      A 3 mm polyp was found in the transverse colon. The polyp was sessile.       The polyp was removed with a cold biopsy forceps. Resection and       retrieval were complete.      Multiple diverticula were found in the sigmoid colon.      The retroflexed view of the distal rectum and anal verge was normal and       showed no anal or rectal abnormalities. Impression:            - One 3 mm polyp in the transverse colon, removed with                         a cold biopsy forceps. Resected and retrieved.                        - Diverticulosis in the sigmoid colon.                        - The distal rectum and anal verge are normal on                         retroflexion view. Recommendation:        - Discharge patient to home (with escort).                        -  Resume previous diet today.                        - Continue present medications.                        - Await pathology results.                        - Repeat colonoscopy in 7 years for surveillance. Procedure Code(s):     --- Professional ---                        [phone], Colonoscopy, flexible; with biopsy, single or                         multiple Diagnosis Code(s):     --- Professional ---                        Z12.11, Encounter for screening for malignant neoplasm                         of colon                        K63.5, Polyp of colon                        K57.30, Diverticulosis of large intestine without                         perforation or abscess without bleeding CPT copyright 2019 American Medical Association. All rights reserved. The codes documented in this report are preliminary and upon coder review may  be revised to meet current compliance requirements. Dr. Ulyess Mort Lin Landsman MD, MD 05/25/2019 11:28:48 AM This report has been signed electronically. Number of Addenda: 0 Note Initiated On: 05/25/2019 10:48 AM Scope Withdrawal Time: 0 hours 7 minutes 56 seconds  Total Procedure Duration: 0 hours 12 minutes 33 seconds  Estimated Blood Loss:  Estimated blood loss: none.      [facility]

## 2019-05-25 NOTE — Transfer of Care (Signed)
Immediate Anesthesia Transfer of Care Note  Patient: Janet [name]  Procedure(s) Performed: COLONOSCOPY WITH PROPOFOL (N/A )  Patient Location: Endoscopy Unit  Anesthesia Type:General  Level of Consciousness: awake  Airway & Oxygen Therapy: Patient Spontanous Breathing  Post-op Assessment: Report given to RN and Post -op Vital signs reviewed and stable  Post vital signs: Reviewed  Last Vitals:  Vitals Value Taken Time  BP 124/48 05/25/19 1126  Temp 36.2 C 05/25/19 1126  Pulse 99 05/25/19 1126  Resp 18 05/25/19 1126  SpO2 98 % 05/25/19 1126    Last Pain:  Vitals:   05/25/19 1017  TempSrc: Temporal  PainSc: 0-No pain         Complications: No apparent anesthesia complications

## 2019-05-25 NOTE — H&P (Signed)
Cephas Darby, MD [address]  [place]  [place], [place] [address]  Main: [phone]  Fax: [phone] Pager: [phone]  Primary Care Physician:  Marguerita Merles, MD Primary Gastroenterologist:  Dr. Cephas Darby  Pre-Procedure History & Physical: HPI:  Janet [name] is a 69 y.o. female is here for an colonoscopy.   Past Medical History:  Diagnosis Date  . Asthma   . Cancer ([place])    kidney  . Hypertension     Past Surgical History:  Procedure Laterality Date  . ABDOMINAL HYSTERECTOMY      Prior to Admission medications   Medication Sig Start Date End Date Taking? Authorizing Provider  albuterol (PROVENTIL HFA;VENTOLIN HFA) 108 (90 BASE) MCG/ACT inhaler Inhale 2 puffs into the lungs every 6 (six) hours as needed for wheezing or shortness of breath. 12/24/14  Yes [name], MD  albuterol (VENTOLIN HFA) 108 (90 Base) MCG/ACT inhaler Inhale 2 puffs into the lungs every 6 (six) hours as needed for wheezing or shortness of breath. 05/09/19  Yes Merlyn Lot, MD  aspirin EC 81 MG tablet Take 81 mg by mouth daily.   Yes [provider]  baclofen (LIORESAL) 10 MG tablet Take 0.5-1 tablets (5-10 mg total) by mouth 3 (three) times daily as needed for muscle spasms. 09/30/18  Yes Coral Spikes, DO  Butalbital-APAP-Caffeine 50-300-40 MG CAPS take 1 to 2 capsules by mouth every 6 hours if needed for headache 07/30/17  Yes [provider]  doxycycline (VIBRA-TABS) 100 MG tablet Take 1 tablet (100 mg total) by mouth 2 (two) times daily. 09/19/18  Yes Kris Hartmann, NP  Elastic Bandages & Supports (EMS ANTI-EMBOLISM STOCKINGS) MISC Apply in the morning and remove at night. 06/23/14  Yes [provider]  lisinopril (PRINIVIL,ZESTRIL) 40 MG tablet Take 1 tablet by mouth daily. 08/27/14  Yes [provider]  meclizine (ANTIVERT) 12.5 MG tablet Take 1 tablet (12.5 mg total) by mouth 3 (three) times daily as needed for dizziness or nausea. 04/15/17   Yes Delman Kitten, MD  potassium chloride SA (K-DUR,KLOR-CON) 20 MEQ tablet Take 20 mEq by mouth daily.    Yes [provider]  predniSONE (DELTASONE) 20 MG tablet Take 2 tablets (40 mg total) by mouth daily. 05/09/19  Yes Harvest Dark, MD  silver sulfADIAZINE (SILVADENE) 1 % cream Apply 1 application topically 2 (two) times daily. 04/27/18  Yes Norval Gable, MD  traMADol (ULTRAM) 50 MG tablet Take 1 tablet (50 mg total) by mouth every 12 (twelve) hours as needed for severe pain. 05/09/19  Yes Merlyn Lot, MD  gabapentin (NEURONTIN) 300 MG capsule Take 1 capsule (300 mg total) by mouth at bedtime. 05/30/18 09/30/18  Kris Hartmann, NP    Allergies as of 05/06/2019 - Review Complete 05/06/2019  Allergen Reaction Noted  . Amlodipine Swelling 02/21/2016  . Shellfish allergy Anaphylaxis 12/24/2014  . Thiazide-type diuretics Other (See Comments) 03/05/2012  . Penicillins Hives and Other (See Comments) 09/15/2014    Family History  Problem Relation Age of Onset  . Colon cancer Mother   . Hypertension Mother   . Heart attack Father 107  . Hypertension Father   . Hyperlipidemia Father   . Diabetes Father   . Stroke Neg Hx   . Breast cancer Neg Hx     Social History   Socioeconomic History  . Marital status: Married    Spouse name: Not on file  . Number of children: Not on file  .  Years of education: Not on file  . Highest education level: Not on file  Occupational History  . Not on file  Tobacco Use  . Smoking status: Never Smoker  . Smokeless tobacco: Never Used  Substance and Sexual Activity  . Alcohol use: No  . Drug use: No  . Sexual activity: Not on file  Other Topics Concern  . Not on file  Social History Narrative  . Not on file   Social Determinants of Health   Financial Resource Strain:   . Difficulty of Paying Living Expenses:   Food Insecurity:   . Worried About Charity fundraiser in the Last Year:   . Arboriculturist in the Last Year:     Transportation Needs:   . Film/video editor (Medical):   Marland Kitchen Lack of Transportation (Non-Medical):   Physical Activity:   . Days of Exercise per Week:   . Minutes of Exercise per Session:   Stress:   . Feeling of Stress :   Social Connections:   . Frequency of Communication with Friends and Family:   . Frequency of Social Gatherings with Friends and Family:   . Attends Religious Services:   . Active Member of Clubs or Organizations:   . Attends Archivist Meetings:   Marland Kitchen Marital Status:   Intimate Partner Violence:   . Fear of Current or Ex-Partner:   . Emotionally Abused:   Marland Kitchen Physically Abused:   . Sexually Abused:     Review of Systems: See HPI, otherwise negative ROS  Physical Exam: BP (!) 157/85   Pulse (!) 108   Temp 98.4 F (36.9 C) (Temporal)   Resp 20   Ht 5\' 9"  (1.753 m)   Wt 94.8 kg   SpO2 100%   BMI 30.86 kg/m  General:   Alert,  pleasant and cooperative in NAD Head:  Normocephalic and atraumatic. Neck:  Supple; no masses or thyromegaly. Lungs:  Clear throughout to auscultation.    Heart:  Regular rate and rhythm. Abdomen:  Soft, nontender and nondistended. Normal bowel sounds, without guarding, and without rebound.   Neurologic:  Alert and  oriented x4;  grossly normal neurologically.  Impression/Plan: Janet [name] is here for an colonoscopy to be performed for colon cancer screening  Risks, benefits, limitations, and alternatives regarding  colonoscopy have been reviewed with the patient.  Questions have been answered.  All parties agreeable.   Sherri Sear, MD  05/25/2019, 10:52 AM

## 2019-05-25 NOTE — Anesthesia Preprocedure Evaluation (Signed)
Anesthesia Evaluation  Patient identified by MRN, date of birth, ID band Patient awake    Reviewed: Allergy & Precautions, NPO status , Patient's Chart, lab work & pertinent test results  History of Anesthesia Complications Negative for: history of anesthetic complications  Airway Mallampati: II  TM Distance: >3 FB Neck ROM: Full    Dental  (+) Poor Dentition   Pulmonary asthma , neg sleep apnea,    breath sounds clear to auscultation- rhonchi (-) wheezing      Cardiovascular hypertension, Pt. on medications (-) CAD, (-) Past MI, (-) Cardiac Stents and (-) CABG  Rhythm:Regular Rate:Normal - Systolic murmurs and - Diastolic murmurs    Neuro/Psych  Headaches, neg Seizures PSYCHIATRIC DISORDERS    GI/Hepatic negative GI ROS, Neg liver ROS,   Endo/Other  negative endocrine ROSneg diabetes  Renal/GU negative Renal ROS     Musculoskeletal negative musculoskeletal ROS (+)   Abdominal (+) + obese,   Peds  Hematology negative hematology ROS (+)   Anesthesia Other Findings Past Medical History: No date: Asthma No date: Cancer ([place])     Comment:  kidney No date: Hypertension   Reproductive/Obstetrics                             Anesthesia Physical Anesthesia Plan  ASA: II  Anesthesia Plan: General   Post-op Pain Management:    Induction: Intravenous  PONV Risk Score and Plan: 2 and Propofol infusion  Airway Management Planned: Natural Airway  Additional Equipment:   Intra-op Plan:   Post-operative Plan:   Informed Consent: I have reviewed the patients History and Physical, chart, labs and discussed the procedure including the risks, benefits and alternatives for the proposed anesthesia with the patient or authorized representative who has indicated his/her understanding and acceptance.     Dental advisory given  Plan Discussed with: CRNA and Anesthesiologist  Anesthesia Plan  Comments:         Anesthesia Quick Evaluation

## 2019-05-25 NOTE — Anesthesia Postprocedure Evaluation (Signed)
Anesthesia Post Note  Patient: Janet [name]  Procedure(s) Performed: COLONOSCOPY WITH PROPOFOL (N/A )  Patient location during evaluation: Endoscopy Anesthesia Type: General Level of consciousness: awake and alert and oriented Pain management: pain level controlled Vital Signs Assessment: post-procedure vital signs reviewed and stable Respiratory status: spontaneous breathing, nonlabored ventilation and respiratory function stable Cardiovascular status: blood pressure returned to baseline and stable Postop Assessment: no signs of nausea or vomiting Anesthetic complications: no     Last Vitals:  Vitals:   05/25/19 1136 05/25/19 1146  BP: 134/67 (!) 145/68  Pulse: 82 80  Resp: 18 16  Temp:    SpO2: 100% 100%    Last Pain:  Vitals:   05/25/19 1146  TempSrc:   PainSc: 0-No pain                 Redith Drach

## 2019-05-26 ENCOUNTER — Encounter: Payer: Self-pay | Admitting: *Deleted

## 2019-05-26 LAB — SURGICAL PATHOLOGY

## 2019-05-27 ENCOUNTER — Encounter: Payer: Self-pay | Admitting: Gastroenterology

## 2019-07-07 ENCOUNTER — Other Ambulatory Visit: Payer: Self-pay

## 2019-07-07 ENCOUNTER — Ambulatory Visit (INDEPENDENT_AMBULATORY_CARE_PROVIDER_SITE_OTHER): Payer: Medicare Other | Admitting: Nurse Practitioner

## 2019-07-07 ENCOUNTER — Encounter (INDEPENDENT_AMBULATORY_CARE_PROVIDER_SITE_OTHER): Payer: Self-pay | Admitting: Nurse Practitioner

## 2019-07-07 VITALS — BP 128/77 | HR 78 | Ht 69.0 in | Wt 213.0 lb

## 2019-07-07 DIAGNOSIS — I890 Lymphedema, not elsewhere classified: Secondary | ICD-10-CM

## 2019-07-07 DIAGNOSIS — R200 Anesthesia of skin: Secondary | ICD-10-CM

## 2019-07-07 DIAGNOSIS — R202 Paresthesia of skin: Secondary | ICD-10-CM | POA: Diagnosis not present

## 2019-07-07 DIAGNOSIS — I10 Essential (primary) hypertension: Secondary | ICD-10-CM

## 2019-07-07 NOTE — Progress Notes (Signed)
Subjective:    Patient ID: Janet [name], female    DOB: 1950-09-26, 69 y.o.   MRN: [id number] Chief Complaint  Patient presents with  . Follow-up    6 mo no studies    The patient returns to the office for followup evaluation regarding leg swelling.  The swelling has persisted and the pain associated with swelling continues. There have not been any interval development of a ulcerations or wounds.  Since the previous visit the patient has been wearing graduated compression stockings and has noted little if any improvement in the lymphedema. The patient has been using compression routinely morning until night.  The patient also states elevation during the day and exercise is being done too.   The patient does complain of some numbness to her left shin area and feels as if she is walking on gravel even when she is barefoot or wearing shoes.  She describes it as a numbness/tingling feeling.   Review of Systems  Cardiovascular: Positive for leg swelling.  Neurological: Positive for numbness.  All other systems reviewed and are negative.      Objective:   Physical Exam Vitals reviewed.  Cardiovascular:     Rate and Rhythm: Normal rate and regular rhythm.     Pulses: Normal pulses.     Heart sounds: Normal heart sounds.  Pulmonary:     Effort: Pulmonary effort is normal.     Breath sounds: Normal breath sounds.  Musculoskeletal:     Right lower leg: 2+ Edema present.     Left lower leg: 2+ Edema present.  Neurological:     Mental Status: She is alert and oriented to person, place, and time.  Psychiatric:        Mood and Affect: Mood normal.        Behavior: Behavior normal.        Thought Content: Thought content normal.        Judgment: Judgment normal.     BP 128/77   Pulse 78   Ht 5\' 9"  (1.753 m)   Wt 213 lb (96.6 kg)   BMI 31.45 kg/m   Past Medical History:  Diagnosis Date  . Asthma   . Cancer ([place])    kidney  . Hypertension     Social History    Socioeconomic History  . Marital status: Married    Spouse name: Not on file  . Number of children: Not on file  . Years of education: Not on file  . Highest education level: Not on file  Occupational History  . Not on file  Tobacco Use  . Smoking status: Never Smoker  . Smokeless tobacco: Never Used  Vaping Use  . Vaping Use: Never used  Substance and Sexual Activity  . Alcohol use: No  . Drug use: No  . Sexual activity: Not on file  Other Topics Concern  . Not on file  Social History Narrative  . Not on file   Social Determinants of Health   Financial Resource Strain:   . Difficulty of Paying Living Expenses:   Food Insecurity:   . Worried About Charity fundraiser in the Last Year:   . Arboriculturist in the Last Year:   Transportation Needs:   . Film/video editor (Medical):   Marland Kitchen Lack of Transportation (Non-Medical):   Physical Activity:   . Days of Exercise per Week:   . Minutes of Exercise per Session:   Stress:   . Feeling  of Stress :   Social Connections:   . Frequency of Communication with Friends and Family:   . Frequency of Social Gatherings with Friends and Family:   . Attends Religious Services:   . Active Member of Clubs or Organizations:   . Attends Archivist Meetings:   Marland Kitchen Marital Status:   Intimate Partner Violence:   . Fear of Current or Ex-Partner:   . Emotionally Abused:   Marland Kitchen Physically Abused:   . Sexually Abused:     Past Surgical History:  Procedure Laterality Date  . ABDOMINAL HYSTERECTOMY    . COLONOSCOPY WITH PROPOFOL N/A 05/25/2019   Procedure: COLONOSCOPY WITH PROPOFOL;  Surgeon: Lin Landsman, MD;  Location: [facility] ENDOSCOPY;  Service: Gastroenterology;  Laterality: N/A;    Family History  Problem Relation Age of Onset  . Colon cancer Mother   . Hypertension Mother   . Heart attack Father 2  . Hypertension Father   . Hyperlipidemia Father   . Diabetes Father   . Stroke Neg Hx   . Breast cancer Neg Hx      Allergies  Allergen Reactions  . Amlodipine Swelling    Other reaction(s): Angioedema  . Shellfish Allergy Anaphylaxis  . Thiazide-Type Diuretics Other (See Comments)    Other reaction(s): Other (See Comments)  . Penicillins Hives and Other (See Comments)    Has patient had a PCN reaction causing immediate rash, facial/tongue/throat swelling, SOB or lightheadedness with hypotension: no Has patient had a PCN reaction causing severe rash involving mucus membranes or skin necrosis: no Has patient had a PCN reaction that required hospitalization? no Has patient had a PCN reaction occurring within the last 10 years: no If all of the above answers are "NO", then may proceed with Cephalosporin use.        Assessment & Plan:   1. Lymphedema Recommend:  No surgery or intervention at this point in time.    I have reviewed my previous discussion with the patient regarding swelling and why it causes symptoms.  Patient will continue wearing graduated compression stockings class 1 (20-30 mmHg) on a daily basis. The patient will  beginning wearing the stockings first thing in the morning and removing them in the evening. The patient is instructed specifically not to sleep in the stockings.    In addition, behavioral modification including several periods of elevation of the lower extremities during the day will be continued.  This was reviewed with the patient during the initial visit.  The patient will also continue routine exercise, especially walking on a daily basis as was discussed during the initial visit.    Despite conservative treatments of at least four weeks including graduated compression therapy class 1 and behavioral modification including exercise and elevation the patient  has not obtained adequate control of the lymphedema.  The patient still has stage 3 lymphedema and therefore, I believe that a lymph pump should be added to improve the control of the patient's lymphedema.   Additionally, a lymph pump is warranted because it will reduce the risk of cellulitis and ulceration in the future.  Patient should follow-up in six months    2. Numbness and tingling of left lower extremity Based on the patient's description of numbness and tingling, it is likely this is related to neuropathic causes.  Patient is advised to follow-up with PCP for further work-up.  3. Essential hypertension Continue antihypertensive medications as already ordered, these medications have been reviewed and there are no changes at  this time.    Current Outpatient Medications on File Prior to Visit  Medication Sig Dispense Refill  . albuterol (PROVENTIL HFA;VENTOLIN HFA) 108 (90 BASE) MCG/ACT inhaler Inhale 2 puffs into the lungs every 6 (six) hours as needed for wheezing or shortness of breath. 1 Inhaler 0  . albuterol (VENTOLIN HFA) 108 (90 Base) MCG/ACT inhaler Inhale 2 puffs into the lungs every 6 (six) hours as needed for wheezing or shortness of breath. 8 g 1  . aspirin EC 81 MG tablet Take 81 mg by mouth daily.    . baclofen (LIORESAL) 10 MG tablet Take 0.5-1 tablets (5-10 mg total) by mouth 3 (three) times daily as needed for muscle spasms. 30 each 0  . Butalbital-APAP-Caffeine 50-300-40 MG CAPS take 1 to 2 capsules by mouth every 6 hours if needed for headache    . carvedilol (COREG) 6.25 MG tablet Take 6.25 mg by mouth 2 (two) times daily.    Marland Kitchen doxycycline (VIBRA-TABS) 100 MG tablet Take 1 tablet (100 mg total) by mouth 2 (two) times daily. 20 tablet 0  . Elastic Bandages & Supports (EMS ANTI-EMBOLISM STOCKINGS) MISC Apply in the morning and remove at night.    Marland Kitchen lisinopril (PRINIVIL,ZESTRIL) 40 MG tablet Take 1 tablet by mouth daily.  0  . magnesium citrate SOLN Take 1 Bottle by mouth once.    . meclizine (ANTIVERT) 12.5 MG tablet Take 1 tablet (12.5 mg total) by mouth 3 (three) times daily as needed for dizziness or nausea. 30 tablet 0  . meclizine (ANTIVERT) 25 MG tablet Take 25 mg  by mouth every 6 (six) hours.    . polyethylene glycol powder (GLYCOLAX/MIRALAX) 17 GM/SCOOP powder     . potassium chloride SA (K-DUR,KLOR-CON) 20 MEQ tablet Take 20 mEq by mouth daily.     . predniSONE (DELTASONE) 20 MG tablet Take 2 tablets (40 mg total) by mouth daily. 10 tablet 0  . silver sulfADIAZINE (SILVADENE) 1 % cream Apply 1 application topically 2 (two) times daily. 50 g 0  . traMADol (ULTRAM) 50 MG tablet Take 1 tablet (50 mg total) by mouth every 12 (twelve) hours as needed for severe pain. 10 tablet 0  . [DISCONTINUED] gabapentin (NEURONTIN) 300 MG capsule Take 1 capsule (300 mg total) by mouth at bedtime. 30 capsule 0   No current facility-administered medications on file prior to visit.    There are no Patient Instructions on file for this visit. No follow-ups on file.   Kris Hartmann, NP

## 2019-09-10 ENCOUNTER — Ambulatory Visit: Payer: Medicare Other | Admitting: Gastroenterology

## 2019-09-30 ENCOUNTER — Other Ambulatory Visit: Payer: Self-pay

## 2019-09-30 ENCOUNTER — Ambulatory Visit (INDEPENDENT_AMBULATORY_CARE_PROVIDER_SITE_OTHER): Payer: Medicare Other | Admitting: Gastroenterology

## 2019-09-30 ENCOUNTER — Encounter: Payer: Self-pay | Admitting: Gastroenterology

## 2019-09-30 VITALS — BP 143/82 | HR 84 | Temp 98.3°F | Wt 211.4 lb

## 2019-09-30 DIAGNOSIS — K5909 Other constipation: Secondary | ICD-10-CM

## 2019-09-30 MED ORDER — LINACLOTIDE 145 MCG PO CAPS: 145.0000 ug | ORAL_CAPSULE | Freq: Every day | ORAL | 5 refills | Status: DC

## 2019-09-30 NOTE — Progress Notes (Signed)
Cephas Darby, MD [address]  [place]  [place], [place] [address]  Main: [phone]  Fax: [phone]    Gastroenterology Consultation  Referring Provider:     Marguerita Merles, MD Primary Care Physician:  Marguerita Merles, MD Primary Gastroenterologist:  Dr. Cephas Darby Reason for Consultation:   Chronic constipation, dyspepsia        HPI:   Janet [name] is a 69 y.o. female referred by Dr. Lennox Grumbles, Connye Burkitt, MD  for consultation & management of chronic constipation, dyspepsia.  Patient reports she has been experiencing consisting of irregular bowel movements with significant straining.  She has been improved use as needed.  Stools normal bowel movements with incomplete emptying.  She denies rectal bleeding, abdominal pain. Stools are generally soft which she takes to stop.  She acknowledges drinking several cans of Pepsi daily.  Not drink water.  She also reports epigastric discomfort, gas, bloating and belching, sometimes regurgitation.  She denies heartburn.  She denies weight loss.  Her labs from last year including CBC, CMP were unremarkable.  TSH normal.  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  She does not drink alcohol, does not smoke S/p hysterectomy Patient reports undergoing stool card test for colon cancer screening annually  Follow-up visit 09/30/2019 Patient underwent colonoscopy 2021, it was otherwise unremarkable except for small polyp.  Patient reports that she tried Linzess 145 MCG samples and it really worked well for constipation.  She did not call our office for a prescription.  She reports that her bowel movements are every 2 to 3 days and are hard.  She is trying to cut back on carbonated beverages.  She denies any abdominal bloating.  She does report intermittent nausea which is seldom.  She does not have any other concerns today  NSAIDs: None  Antiplts/Anticoagulants/Anti  thrombotics: None  GI Procedures:  Colonoscopy 05/25/2019 - One 3 mm polyp in the transverse colon, removed with a cold biopsy forceps. Resected and retrieved. - Diverticulosis in the sigmoid colon. - The distal rectum and anal verge are normal on retroflexion view. DIAGNOSIS:  A. COLON POLYP, TRANSVERSE; COLD BIOPSY:  - TUBULAR ADENOMA.  - NEGATIVE FOR HIGH-GRADE DYSPLASIA AND MALIGNANCY.   Patient reports her mother passed away from colon cancer in her 86s  Past Medical History:  Diagnosis Date  . Asthma   . Cancer ([place])    kidney  . Hypertension     Past Surgical History:  Procedure Laterality Date  . ABDOMINAL HYSTERECTOMY    . COLONOSCOPY WITH PROPOFOL N/A 05/25/2019   Procedure: COLONOSCOPY WITH PROPOFOL;  Surgeon: Lin Landsman, MD;  Location: [facility] ENDOSCOPY;  Service: Gastroenterology;  Laterality: N/A;    Current Outpatient Medications:  .  albuterol (PROVENTIL HFA;VENTOLIN HFA) 108 (90 BASE) MCG/ACT inhaler, Inhale 2 puffs into the lungs every 6 (six) hours as needed for wheezing or shortness of breath., Disp: 1 Inhaler, Rfl: 0 .  albuterol (VENTOLIN HFA) 108 (90 Base) MCG/ACT inhaler, Inhale 2 puffs into the lungs every 6 (six) hours as needed for wheezing or shortness of breath., Disp: 8 g, Rfl: 1 .  aspirin EC 81 MG tablet, Take 81 mg by mouth daily., Disp: , Rfl:  .  baclofen (LIORESAL) 10 MG tablet, Take 0.5-1 tablets (5-10 mg total) by mouth 3 (three) times daily as needed for muscle spasms., Disp: 30 each, Rfl: 0 .  Butalbital-APAP-Caffeine  50-300-40 MG CAPS, take 1 to 2 capsules by mouth every 6 hours if needed for headache, Disp: , Rfl:  .  carvedilol (COREG) 6.25 MG tablet, Take 6.25 mg by mouth 2 (two) times daily., Disp: , Rfl:  .  Elastic Bandages & Supports (EMS ANTI-EMBOLISM STOCKINGS) MISC, Apply in the morning and remove at night., Disp: , Rfl:  .  lisinopril (PRINIVIL,ZESTRIL) 40 MG tablet, Take 1 tablet by mouth daily., Disp: , Rfl: 0 .   magnesium citrate SOLN, Take 1 Bottle by mouth once., Disp: , Rfl:  .  meclizine (ANTIVERT) 12.5 MG tablet, Take 1 tablet (12.5 mg total) by mouth 3 (three) times daily as needed for dizziness or nausea., Disp: 30 tablet, Rfl: 0 .  meclizine (ANTIVERT) 25 MG tablet, Take 25 mg by mouth every 6 (six) hours., Disp: , Rfl:  .  polyethylene glycol powder (GLYCOLAX/MIRALAX) 17 GM/SCOOP powder, , Disp: , Rfl:  .  potassium chloride SA (K-DUR,KLOR-CON) 20 MEQ tablet, Take 20 mEq by mouth daily. , Disp: , Rfl:  .  traMADol (ULTRAM) 50 MG tablet, Take 1 tablet (50 mg total) by mouth every 12 (twelve) hours as needed for severe pain., Disp: 10 tablet, Rfl: 0 .  linaclotide (LINZESS) 145 MCG CAPS capsule, Take 1 capsule (145 mcg total) by mouth daily before breakfast., Disp: 30 capsule, Rfl: 5   Family History  Problem Relation Age of Onset  . Colon cancer Mother   . Hypertension Mother   . Heart attack Father 110  . Hypertension Father   . Hyperlipidemia Father   . Diabetes Father   . Stroke Neg Hx   . Breast cancer Neg Hx      Social History   Tobacco Use  . Smoking status: Never Smoker  . Smokeless tobacco: Never Used  Vaping Use  . Vaping Use: Never used  Substance Use Topics  . Alcohol use: No  . Drug use: No    Allergies as of 09/30/2019 - Review Complete 07/07/2019  Allergen Reaction Noted  . Amlodipine Swelling 02/21/2016  . Shellfish allergy Anaphylaxis 12/24/2014  . Thiazide-type diuretics Other (See Comments) 03/05/2012  . Penicillins Hives and Other (See Comments) 09/15/2014    Review of Systems:    All systems reviewed and negative except where noted in HPI.   Physical Exam:  BP (!) 143/82 (BP Location: Left Arm, Patient Position: Sitting, Cuff Size: Normal)   Pulse 84   Temp 98.3 F (36.8 C) (Oral)   Wt 211 lb 6 oz (95.9 kg)   BMI 31.21 kg/m  No LMP recorded. Patient has had a hysterectomy.  General:   Alert,  Well-developed, well-nourished, pleasant and  cooperative in NAD Head:  Normocephalic and atraumatic. Eyes:  Sclera clear, no icterus.   Conjunctiva pink. Ears:  Normal auditory acuity. Nose:  No deformity, discharge, or lesions. Mouth:  No deformity or lesions,oropharynx pink & moist. Neck:  Supple; no masses or thyromegaly. Lungs:  Respirations even and unlabored.  Clear throughout to auscultation.   No wheezes, crackles, or rhonchi. No acute distress. Heart:  Regular rate and rhythm; no murmurs, clicks, rubs, or gallops. Abdomen:  Normal bowel sounds. Soft, obese, mild lower abdominal tenderness, worse in the lower quadrant and non-distended without masses, hepatosplenomegaly or hernias noted.  No guarding or rebound tenderness.   Rectal: Not performed Msk:  Symmetrical without gross deformities. Good, equal movement & strength bilaterally. Pulses:  Normal pulses noted. Extremities:  No clubbing or edema.  No cyanosis. Neurologic:  Alert and oriented x3;  grossly normal neurologically. Skin:  Intact without significant lesions or rashes. No jaundice. Psych:  Alert and cooperative. Normal mood and affect.  Imaging Studies: Reviewed  Assessment and Plan:   Janet [name] is a 69 y.o. female with BMI 35, hypertension is seen in consultation for chronic constipation and dyspepsia.  Chronic constipation: Most likely secondary to her lifestyle and eating habits Strongly advised her to completely eliminate carbonated beverages Discussed about high-fiber diet, information provided Start Linzess 145 MCG daily  Dyspepsia: Secondary to carbonated beverages Currently resolved   Follow up as needed   Cephas Darby, MD

## 2019-12-15 ENCOUNTER — Encounter (INDEPENDENT_AMBULATORY_CARE_PROVIDER_SITE_OTHER): Payer: Self-pay | Admitting: Nurse Practitioner

## 2019-12-15 ENCOUNTER — Other Ambulatory Visit: Payer: Self-pay

## 2019-12-15 ENCOUNTER — Ambulatory Visit (INDEPENDENT_AMBULATORY_CARE_PROVIDER_SITE_OTHER): Payer: Medicare Other | Admitting: Nurse Practitioner

## 2019-12-15 VITALS — BP 131/74 | HR 71 | Ht 69.0 in | Wt 207.0 lb

## 2019-12-15 DIAGNOSIS — R202 Paresthesia of skin: Secondary | ICD-10-CM

## 2019-12-15 DIAGNOSIS — I890 Lymphedema, not elsewhere classified: Secondary | ICD-10-CM | POA: Diagnosis not present

## 2019-12-15 DIAGNOSIS — R200 Anesthesia of skin: Secondary | ICD-10-CM

## 2019-12-15 DIAGNOSIS — I10 Essential (primary) hypertension: Secondary | ICD-10-CM

## 2019-12-20 ENCOUNTER — Encounter (INDEPENDENT_AMBULATORY_CARE_PROVIDER_SITE_OTHER): Payer: Self-pay | Admitting: Nurse Practitioner

## 2019-12-20 NOTE — Progress Notes (Signed)
Subjective:    Patient ID: Janet [name], female    DOB: September 03, 1950, 69 y.o.   MRN: [id number] Chief Complaint  Patient presents with  . Follow-up    58Mo F/U no studies    The patient returns to the office for followup evaluation regarding leg swelling.  The swelling has persisted but with the lymph pump is much, much better controlled. The pain associated with swelling is essentially eliminated. There have not been any interval development of a ulcerations or wounds.  The patient denies problems with the pump, noting it is working well and the leggings are in good condition.  Since the previous visit the patient has been wearing graduated compression stockings and using the lymph pump on a routine basis and  has noted significant improvement in the lymphedema.   Patient stated the lymph pump has been a very positive factor in her care.    Review of Systems  Cardiovascular: Positive for leg swelling.  Neurological: Positive for numbness.  All other systems reviewed and are negative.      Objective:   Physical Exam Vitals reviewed.  HENT:     Head: Normocephalic.  Cardiovascular:     Rate and Rhythm: Normal rate.     Pulses: Normal pulses.  Pulmonary:     Effort: Pulmonary effort is normal.  Musculoskeletal:     Left lower leg: Edema present.  Skin:    General: Skin is warm and dry.  Neurological:     Mental Status: She is alert and oriented to person, place, and time.  Psychiatric:        Mood and Affect: Mood normal.        Behavior: Behavior normal.        Thought Content: Thought content normal.        Judgment: Judgment normal.     BP 131/74   Pulse 71   Ht 5\' 9"  (1.753 m)   Wt 207 lb (93.9 kg)   BMI 30.57 kg/m   Past Medical History:  Diagnosis Date  . Asthma   . Cancer ([place])    kidney  . Hypertension     Social History   Socioeconomic History  . Marital status: Married    Spouse name: Not on file  . Number of children: Not on file  . Years  of education: Not on file  . Highest education level: Not on file  Occupational History  . Not on file  Tobacco Use  . Smoking status: Never Smoker  . Smokeless tobacco: Never Used  Vaping Use  . Vaping Use: Never used  Substance and Sexual Activity  . Alcohol use: No  . Drug use: No  . Sexual activity: Not on file  Other Topics Concern  . Not on file  Social History Narrative  . Not on file   Social Determinants of Health   Financial Resource Strain:   . Difficulty of Paying Living Expenses: Not on file  Food Insecurity:   . Worried About Charity fundraiser in the Last Year: Not on file  . Ran Out of Food in the Last Year: Not on file  Transportation Needs:   . Lack of Transportation (Medical): Not on file  . Lack of Transportation (Non-Medical): Not on file  Physical Activity:   . Days of Exercise per Week: Not on file  . Minutes of Exercise per Session: Not on file  Stress:   . Feeling of Stress : Not on file  Social Connections:   . Frequency of Communication with Friends and Family: Not on file  . Frequency of Social Gatherings with Friends and Family: Not on file  . Attends Religious Services: Not on file  . Active Member of Clubs or Organizations: Not on file  . Attends Archivist Meetings: Not on file  . Marital Status: Not on file  Intimate Partner Violence:   . Fear of Current or Ex-Partner: Not on file  . Emotionally Abused: Not on file  . Physically Abused: Not on file  . Sexually Abused: Not on file    Past Surgical History:  Procedure Laterality Date  . ABDOMINAL HYSTERECTOMY    . COLONOSCOPY WITH PROPOFOL N/A 05/25/2019   Procedure: COLONOSCOPY WITH PROPOFOL;  Surgeon: Lin Landsman, MD;  Location: [facility] ENDOSCOPY;  Service: Gastroenterology;  Laterality: N/A;    Family History  Problem Relation Age of Onset  . Colon cancer Mother   . Hypertension Mother   . Heart attack Father 35  . Hypertension Father   . Hyperlipidemia  Father   . Diabetes Father   . Stroke Neg Hx   . Breast cancer Neg Hx     Allergies  Allergen Reactions  . Amlodipine Swelling    Other reaction(s): Angioedema  . Shellfish Allergy Anaphylaxis  . Thiazide-Type Diuretics Other (See Comments)    Other reaction(s): Other (See Comments)  . Penicillins Hives and Other (See Comments)    Has patient had a PCN reaction causing immediate rash, facial/tongue/throat swelling, SOB or lightheadedness with hypotension: no Has patient had a PCN reaction causing severe rash involving mucus membranes or skin necrosis: no Has patient had a PCN reaction that required hospitalization? no Has patient had a PCN reaction occurring within the last 10 years: no If all of the above answers are "NO", then may proceed with Cephalosporin use.     CBC Latest Ref Rng & Units 05/08/2019 02/19/2018 06/23/2017  WBC 4.0 - 10.5 K/uL 7.0 6.9 7.0  Hemoglobin 12.0 - 15.0 g/dL 12.5 13.1 14.0  Hematocrit 36 - 46 % 39.1 41.6 41.5  Platelets 150 - 400 K/uL 307 335 351      CMP     Component Value Date/Time   NA 139 05/08/2019 2037   NA 141 04/12/2014 1225   K 3.9 05/08/2019 2037   K 3.4 (L) 04/12/2014 1225   CL 108 05/08/2019 2037   CL 109 04/12/2014 1225   CO2 21 (L) 05/08/2019 2037   CO2 25 04/12/2014 1225   GLUCOSE 96 05/08/2019 2037   GLUCOSE 95 04/12/2014 1225   BUN 15 05/08/2019 2037   BUN 17 04/12/2014 1225   CREATININE 0.95 05/08/2019 2037   CREATININE 0.64 04/12/2014 1225   CALCIUM 9.7 05/08/2019 2037   CALCIUM 9.2 04/12/2014 1225   PROT 8.2 (H) 02/19/2018 2220   PROT 8.2 03/11/2012 1133   ALBUMIN 4.1 02/19/2018 2220   ALBUMIN 3.6 03/11/2012 1133   AST 18 02/19/2018 2220   AST 19 03/11/2012 1133   ALT 12 02/19/2018 2220   ALT 16 03/11/2012 1133   ALKPHOS 47 02/19/2018 2220   ALKPHOS 62 03/11/2012 1133   BILITOT 0.6 02/19/2018 2220   BILITOT 0.5 03/11/2012 1133   GFRNONAA >60 05/08/2019 2037   GFRNONAA >60 04/12/2014 1225   GFRAA >60  05/08/2019 2037   GFRAA >60 04/12/2014 1225     No results found.     Assessment & Plan:   1. Lymphedema  No surgery or  intervention at this point in time.    I have reviewed my discussion with the patient regarding lymphedema and why it  causes symptoms.  Patient will continue wearing graduated compression stockings class 1 (20-30 mmHg) on a daily basis a prescription was given. The patient is reminded to put the stockings on first thing in the morning and removing them in the evening. The patient is instructed specifically not to sleep in the stockings.   In addition, behavioral modification throughout the day will be continued.  This will include frequent elevation (such as in a recliner), use of over the counter pain medications as needed and exercise such as walking.  I have reviewed systemic causes for chronic edema such as liver, kidney and cardiac etiologies and there does not appear to be any significant changes in these organ systems over the past year.  The patient is under the impression that these organ systems are all stable and unchanged.    The patient will continue aggressive use of the  lymph pump.  This will continue to improve the edema control and prevent sequela such as ulcers and infections.   The patient will follow-up with me on an annual basis.    2. Numbness and tingling of left lower extremity The patient continues to have some numbness and tingling in her left lower extremity.  Noninvasive vascular studies done that show no obvious evidence of vascular causes that relate to the numbness and tingling.  At the previous office visit the patient was advised to discuss with primary care provider for further work-up options.  The patient has yet to do this.  The patient is again urged to speak with the primary care provider for further work-up of possible neuropathy.  3. Essential hypertension Continue antihypertensive medications as already ordered, these  medications have been reviewed and there are no changes at this time.    Current Outpatient Medications on File Prior to Visit  Medication Sig Dispense Refill  . albuterol (PROVENTIL HFA;VENTOLIN HFA) 108 (90 BASE) MCG/ACT inhaler Inhale 2 puffs into the lungs every 6 (six) hours as needed for wheezing or shortness of breath. 1 Inhaler 0  . albuterol (VENTOLIN HFA) 108 (90 Base) MCG/ACT inhaler Inhale 2 puffs into the lungs every 6 (six) hours as needed for wheezing or shortness of breath. 8 g 1  . aspirin EC 81 MG tablet Take 81 mg by mouth daily.    . baclofen (LIORESAL) 10 MG tablet Take 0.5-1 tablets (5-10 mg total) by mouth 3 (three) times daily as needed for muscle spasms. 30 each 0  . Butalbital-APAP-Caffeine 50-300-40 MG CAPS take 1 to 2 capsules by mouth every 6 hours if needed for headache    . carvedilol (COREG) 6.25 MG tablet Take 6.25 mg by mouth 2 (two) times daily.    Regino Schultze Bandages & Supports (EMS ANTI-EMBOLISM STOCKINGS) MISC Apply in the morning and remove at night.    Marland Kitchen lisinopril (PRINIVIL,ZESTRIL) 40 MG tablet Take 1 tablet by mouth daily.  0  . magnesium citrate SOLN Take 1 Bottle by mouth once.    . meclizine (ANTIVERT) 12.5 MG tablet Take 1 tablet (12.5 mg total) by mouth 3 (three) times daily as needed for dizziness or nausea. 30 tablet 0  . meclizine (ANTIVERT) 25 MG tablet Take 25 mg by mouth every 6 (six) hours.    . polyethylene glycol powder (GLYCOLAX/MIRALAX) 17 GM/SCOOP powder     . potassium chloride SA (K-DUR,KLOR-CON) 20 MEQ tablet Take 20  mEq by mouth daily.     . traMADol (ULTRAM) 50 MG tablet Take 1 tablet (50 mg total) by mouth every 12 (twelve) hours as needed for severe pain. 10 tablet 0  . linaclotide (LINZESS) 145 MCG CAPS capsule Take 1 capsule (145 mcg total) by mouth daily before breakfast. 30 capsule 5  . [DISCONTINUED] gabapentin (NEURONTIN) 300 MG capsule Take 1 capsule (300 mg total) by mouth at bedtime. 30 capsule 0   No current  facility-administered medications on file prior to visit.    There are no Patient Instructions on file for this visit. No follow-ups on file.   Kris Hartmann, NP

## 2019-12-29 ENCOUNTER — Ambulatory Visit (INDEPENDENT_AMBULATORY_CARE_PROVIDER_SITE_OTHER): Payer: Medicare Other | Admitting: Vascular Surgery

## 2020-02-22 ENCOUNTER — Emergency Department
Admission: EM | Admit: 2020-02-22 | Discharge: 2020-02-22 | Disposition: A | Discharge status: Home / Self Care | Payer: Medicare Other | Attending: Emergency Medicine | Admitting: Emergency Medicine

## 2020-02-22 ENCOUNTER — Emergency Department: Payer: Medicare Other

## 2020-02-22 ENCOUNTER — Other Ambulatory Visit: Payer: Self-pay

## 2020-02-22 DIAGNOSIS — S4991XA Unspecified injury of right shoulder and upper arm, initial encounter: Secondary | ICD-10-CM | POA: Diagnosis present

## 2020-02-22 DIAGNOSIS — J45909 Unspecified asthma, uncomplicated: Secondary | ICD-10-CM | POA: Diagnosis not present

## 2020-02-22 DIAGNOSIS — Z7982 Long term (current) use of aspirin: Secondary | ICD-10-CM | POA: Insufficient documentation

## 2020-02-22 DIAGNOSIS — Y92009 Unspecified place in unspecified non-institutional (private) residence as the place of occurrence of the external cause: Secondary | ICD-10-CM | POA: Insufficient documentation

## 2020-02-22 DIAGNOSIS — S300XXA Contusion of lower back and pelvis, initial encounter: Secondary | ICD-10-CM | POA: Diagnosis not present

## 2020-02-22 DIAGNOSIS — Z79899 Other long term (current) drug therapy: Secondary | ICD-10-CM | POA: Diagnosis not present

## 2020-02-22 DIAGNOSIS — S40021A Contusion of right upper arm, initial encounter: Secondary | ICD-10-CM | POA: Insufficient documentation

## 2020-02-22 DIAGNOSIS — Z85528 Personal history of other malignant neoplasm of kidney: Secondary | ICD-10-CM | POA: Insufficient documentation

## 2020-02-22 DIAGNOSIS — R519 Headache, unspecified: Secondary | ICD-10-CM | POA: Diagnosis not present

## 2020-02-22 DIAGNOSIS — T07XXXA Unspecified multiple injuries, initial encounter: Secondary | ICD-10-CM

## 2020-02-22 DIAGNOSIS — W000XXA Fall on same level due to ice and snow, initial encounter: Secondary | ICD-10-CM | POA: Diagnosis not present

## 2020-02-22 DIAGNOSIS — I10 Essential (primary) hypertension: Secondary | ICD-10-CM | POA: Diagnosis not present

## 2020-02-22 DIAGNOSIS — W19XXXA Unspecified fall, initial encounter: Secondary | ICD-10-CM

## 2020-02-22 MED ORDER — MELOXICAM 15 MG PO TABS
15.0000 mg | ORAL_TABLET | Freq: Every day | ORAL | 2 refills | Status: DC
Start: 2020-02-22 — End: 2021-01-10

## 2020-02-22 MED ORDER — OXYCODONE-ACETAMINOPHEN 5-325 MG PO TABS
1.0000 | ORAL_TABLET | Freq: Once | ORAL | Status: AC
  Administered 2020-02-22: 1 via ORAL
  Filled 2020-02-22: qty 1

## 2020-02-22 MED ORDER — BACLOFEN 10 MG PO TABS: 10.0000 mg | ORAL_TABLET | Freq: Three times a day (TID) | ORAL | 0 refills | Status: AC

## 2020-02-22 MED ORDER — TRAMADOL HCL 50 MG PO TABS: 50.0000 mg | ORAL_TABLET | Freq: Four times a day (QID) | ORAL | 0 refills | Status: DC | PRN

## 2020-02-22 NOTE — ED Triage Notes (Signed)
Pt comes via POV from home with c/o slip and fall  Today. Pt states right arm, back and butt pain.

## 2020-02-22 NOTE — Discharge Instructions (Addendum)
Follow-up with your regular doctor or orthopedics if not improving in 3 to 5 days.  Return emergency department worsening.  Take medication as prescribed.  Apply ice to all areas that hurt

## 2020-02-22 NOTE — ED Provider Notes (Signed)
[facility] Emergency Department Provider Note  ____________________________________________   Event Date/Time   First MD Initiated Contact with Patient 02/22/20 1634     (approximate)  I have reviewed the triage vital signs and the nursing notes.   HISTORY  Chief Complaint Fall and Arm Injury    HPI Janet [name] is a 70 y.o. female presents emergency department after she slipped and fell at home.  Patient states slipped on ice.  She is complaining of lower back pain, right arm pain, and unsure if she bit her head.  Patient is also complaining of pain.  No numbness or tingling.  No LOC.  No vomiting.    Past Medical History:  Diagnosis Date  . Asthma   . Cancer ([place])    kidney  . Hypertension     Patient Active Problem List   Diagnosis Date Noted  . Numbness and tingling of left lower extremity 06/23/2018  . Essential hypertension 05/02/2018  . Swelling of limb 05/02/2018  . Varicose veins with ulcer, left ([place]) 05/02/2018  . Left hip pain 11/01/2017  . Headache disorder 08/29/2017  . Premature ventricular contractions 05/25/2016  . Asthma in adult without complication [id number]  . Heart palpitations 04/23/2016  . History of kidney cancer 04/23/2016  . Bilateral cellulitis of lower leg 10/03/2015  . Renal mass, left 02/24/2015  . Thrombophlebitis 09/29/2014  . Chest pressure 09/12/2014  . Dyspnea on exertion 09/12/2014  . Generalized weakness 09/09/2014  . 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 ABDOMINAL HYSTERECTOMY    . COLONOSCOPY WITH PROPOFOL N/A 05/25/2019   Procedure: COLONOSCOPY WITH PROPOFOL;  Surgeon: Lin Landsman, MD;  Location: [facility] ENDOSCOPY;  Service: Gastroenterology;  Laterality: N/A;    Prior to Admission medications   Medication Sig Start Date End Date Taking? Authorizing Provider  baclofen (LIORESAL) 10 MG tablet Take 1 tablet (10 mg total) by mouth 3 (three)  times daily for 30 doses. 02/22/20 03/03/20 Yes Fisher, Linden Dolin, PA-C  meloxicam (MOBIC) 15 MG tablet Take 1 tablet (15 mg total) by mouth daily. 02/22/20 02/21/21 Yes Fisher, Linden Dolin, PA-C  traMADol (ULTRAM) 50 MG tablet Take 1 tablet (50 mg total) by mouth every 6 (six) hours as needed. 02/22/20  Yes Fisher, Linden Dolin, PA-C  albuterol (PROVENTIL HFA;VENTOLIN HFA) 108 (90 BASE) MCG/ACT inhaler Inhale 2 puffs into the lungs every 6 (six) hours as needed for wheezing or shortness of breath. 12/24/14   [name], MD  albuterol (VENTOLIN HFA) 108 (90 Base) MCG/ACT inhaler Inhale 2 puffs into the lungs every 6 (six) hours as needed for wheezing or shortness of breath. 05/09/19   Merlyn Lot, MD  aspirin EC 81 MG tablet Take 81 mg by mouth daily.    [provider]  Butalbital-APAP-Caffeine 50-300-40 MG CAPS take 1 to 2 capsules by mouth every 6 hours if needed for headache 07/30/17   [provider]  carvedilol (COREG) 6.25 MG tablet Take 6.25 mg by mouth 2 (two) times daily. 03/25/19   [provider]  Elastic Bandages & Supports (EMS ANTI-EMBOLISM STOCKINGS) MISC Apply in the morning and remove at night. 06/23/14   [provider]  linaclotide Rolan Lipa) 145 MCG CAPS capsule Take 1 capsule (145 mcg total) by mouth daily before breakfast. 09/30/19 10/30/19  Lin Landsman, MD  lisinopril (PRINIVIL,ZESTRIL) 40 MG tablet Take 1 tablet by mouth daily. 08/27/14   [provider]  magnesium citrate  SOLN Take 1 Bottle by mouth once. 05/06/19   [provider]  polyethylene glycol powder (GLYCOLAX/MIRALAX) 17 GM/SCOOP powder  04/24/19   [provider]  potassium chloride SA (K-DUR,KLOR-CON) 20 MEQ tablet Take 20 mEq by mouth daily.     [provider]  gabapentin (NEURONTIN) 300 MG capsule Take 1 capsule (300 mg total) by mouth at bedtime. 05/30/18 09/30/18  Kris Hartmann, NP    Allergies Amlodipine, Shellfish allergy, Thiazide-type  diuretics, and Penicillins  Family History  Problem Relation Age of Onset  . Colon cancer Mother   . Hypertension Mother   . Heart attack Father 28  . Hypertension Father   . Hyperlipidemia Father   . Diabetes Father   . Stroke Neg Hx   . Breast cancer Neg Hx     Social History Social History   Tobacco Use  . Smoking status: Never Smoker  . Smokeless tobacco: Never Used  Vaping Use  . Vaping Use: Never used  Substance Use Topics  . Alcohol use: No  . Drug use: No    Review of Systems  Constitutional: No fever/chills Eyes: No visual changes. ENT: No sore throat. Respiratory: Denies cough Cardiovascular: Denies chest pain Gastrointestinal: Denies abdominal pain Genitourinary: Negative for dysuria. Musculoskeletal: Positive for back pain. Skin: Negative for rash. Psychiatric: no mood changes,     ____________________________________________   PHYSICAL EXAM:  VITAL SIGNS: ED Triage Vitals  Enc Vitals Group     BP 02/22/20 1404 (!) 142/71     Pulse Rate 02/22/20 1404 73     Resp 02/22/20 1404 19     Temp 02/22/20 1404 98.4 F (36.9 C)     Temp src --      SpO2 02/22/20 1404 100 %     Weight 02/22/20 1641 205 lb 0.4 oz (93 kg)     Height 02/22/20 1641 5\' 9"  (1.753 m)     Head Circumference --      Peak Flow --      Pain Score 02/22/20 1402 8     Pain Loc --      Pain Edu? --      Excl. in [place]? --     Constitutional: Alert and oriented. Well appearing and in no acute distress. Eyes: Conjunctivae are normal.  Head: Atraumatic. Nose: No congestion/rhinnorhea. Mouth/Throat: Mucous membranes are moist.  Neck:  supple no lymphadenopathy noted Cardiovascular: Normal rate, regular rhythm.  Respiratory: Normal respiratory effort.  No retractions, GU: deferred Musculoskeletal: FROM all extremities, warm and well perfused, right shoulder, right wrist, C-spine and lumbar spine are tender.  No bruising noted.  Noted restricted motion.  Neurovascular is  intact Neurologic:  Normal speech and language.  Skin:  Skin is warm, dry and intact. No rash noted. Psychiatric: Mood and affect are normal. Speech and behavior are normal.  ____________________________________________   LABS (all labs ordered are listed, but only abnormal results are displayed)  Labs Reviewed - No data to display ____________________________________________   ____________________________________________  RADIOLOGY  CT the head and C-spine X-ray of the right humerus, right wrist, and lumbar spine  ____________________________________________   PROCEDURES  Procedure(s) performed: No  Procedures    ____________________________________________   INITIAL IMPRESSION / ASSESSMENT AND PLAN / ED COURSE  Pertinent labs & imaging results that were available during my care of the patient were reviewed by me and considered in my medical decision making (see chart for details).   Patient 71 year old female presents after a fall.  See HPI.  Physical exam shows patient appears stable.  X-ray of the right humerus, right wrist, and lumbar spine are reviewed by me and are all negative for fracture.  This was confirmed by radiology.  CT of the head and C-spine are negative for any acute abnormalities.  I did explain all findings to the patient.  Second Percocet while here in the ED.  She was instructed to take meloxicam and baclofen, tramadol for pain not controlled by this, use ice on all areas that hurt.  Follow-up with your regular doctor if no improvement 3 to 5 days.  Return emergency department worsening.  She was discharged in stable condition in the care of her husband.     Janet [name] was evaluated in Emergency Department on 02/22/2020 for the symptoms described in the history of present illness. She was evaluated in the context of the global COVID-19 pandemic, which necessitated consideration that the patient might be at risk for infection with the SARS-CoV-2  virus that causes COVID-19. Institutional protocols and algorithms that pertain to the evaluation of patients at risk for COVID-19 are in a state of rapid change based on information released by regulatory bodies including the CDC and federal and state organizations. These policies and algorithms were followed during the patient's care in the ED.    As part of my medical decision making, I reviewed the following data within the [place] notes reviewed and incorporated, Old chart reviewed, Radiograph reviewed , Notes from prior ED visits and [place] Controlled Substance Database  ____________________________________________   FINAL CLINICAL IMPRESSION(S) / ED DIAGNOSES  Final diagnoses:  Fall, initial encounter  Multiple contusions      NEW MEDICATIONS STARTED DURING THIS VISIT:  New Prescriptions   BACLOFEN (LIORESAL) 10 MG TABLET    Take 1 tablet (10 mg total) by mouth 3 (three) times daily for 30 doses.   MELOXICAM (MOBIC) 15 MG TABLET    Take 1 tablet (15 mg total) by mouth daily.   TRAMADOL (ULTRAM) 50 MG TABLET    Take 1 tablet (50 mg total) by mouth every 6 (six) hours as needed.     Note:  This document was prepared using Dragon voice recognition software and may include unintentional dictation errors.    Versie Starks, PA-C 02/22/20 1843    Naaman Plummer, MD 02/22/20 [phone]

## 2020-02-22 NOTE — ED Notes (Signed)
See triage note  Presents s/p slip and fall   Having pain to right arm and back/buttocks  Good pulses

## 2020-03-09 ENCOUNTER — Other Ambulatory Visit: Payer: Self-pay | Admitting: Family Medicine

## 2020-03-09 DIAGNOSIS — Z1231 Encounter for screening mammogram for malignant neoplasm of breast: Secondary | ICD-10-CM

## 2020-03-25 ENCOUNTER — Ambulatory Visit
Admission: RE | Admit: 2020-03-25 | Discharge: 2020-03-25 | Disposition: A | Discharge status: Home / Self Care | Payer: Medicare Other | Source: Ambulatory Visit | Attending: Family Medicine | Admitting: Family Medicine

## 2020-03-25 ENCOUNTER — Other Ambulatory Visit: Payer: Self-pay

## 2020-03-25 DIAGNOSIS — Z1231 Encounter for screening mammogram for malignant neoplasm of breast: Secondary | ICD-10-CM | POA: Diagnosis present

## 2020-03-28 ENCOUNTER — Other Ambulatory Visit: Payer: Self-pay

## 2020-03-28 ENCOUNTER — Ambulatory Visit
Admission: EM | Admit: 2020-03-28 | Discharge: 2020-03-28 | Disposition: A | Discharge status: Home / Self Care | Payer: Medicare Other | Attending: Sports Medicine | Admitting: Sports Medicine

## 2020-03-28 DIAGNOSIS — M5386 Other specified dorsopathies, lumbar region: Secondary | ICD-10-CM | POA: Diagnosis present

## 2020-03-28 DIAGNOSIS — M62830 Muscle spasm of back: Secondary | ICD-10-CM | POA: Insufficient documentation

## 2020-03-28 DIAGNOSIS — M545 Low back pain, unspecified: Secondary | ICD-10-CM

## 2020-03-28 DIAGNOSIS — R109 Unspecified abdominal pain: Secondary | ICD-10-CM

## 2020-03-28 LAB — URINALYSIS, COMPLETE (UACMP) WITH MICROSCOPIC
Bilirubin Urine: NEGATIVE
Glucose, UA: NEGATIVE mg/dL
Hgb urine dipstick: NEGATIVE
Leukocytes,Ua: NEGATIVE
Nitrite: NEGATIVE
Protein, ur: NEGATIVE mg/dL
Specific Gravity, Urine: 1.025 (ref 1.005–1.030)
pH: 6 (ref 5.0–8.0)

## 2020-03-28 MED ORDER — METHOCARBAMOL 500 MG PO TABS: 500.0000 mg | ORAL_TABLET | Freq: Two times a day (BID) | ORAL | 0 refills | Status: DC

## 2020-03-28 NOTE — ED Triage Notes (Signed)
Pt reports R flank pain starting three days ago.  Thought she heard a pop and felt pain but also started noticing frequency with decreased UO at the same time.  No odor or blood with urination.  Flank pain is worse with movement.

## 2020-03-28 NOTE — Discharge Instructions (Addendum)
Your urine does not show that you have a urinary tract infection. Your exam is most consistent with a lumbar strain with muscle spasm. I have sent in a prescription for a muscle relaxer. You can use over-the-counter anti-inflammatories as needed. You can heat and ice alternating each to help your symptoms. Please make an appointment with your primary care physician for follow-up.  You may need some physical therapy.  I hope you get to feeling better, Dr. Drema Dallas

## 2020-03-31 ENCOUNTER — Other Ambulatory Visit: Payer: Self-pay

## 2020-04-01 ENCOUNTER — Other Ambulatory Visit: Payer: Self-pay

## 2020-04-01 ENCOUNTER — Ambulatory Visit (INDEPENDENT_AMBULATORY_CARE_PROVIDER_SITE_OTHER): Payer: Medicare Other | Admitting: Gastroenterology

## 2020-04-01 ENCOUNTER — Encounter: Payer: Self-pay | Admitting: Gastroenterology

## 2020-04-01 VITALS — BP 156/79 | HR 81 | Temp 98.1°F | Ht 69.0 in | Wt 201.1 lb

## 2020-04-01 DIAGNOSIS — R058 Other specified cough: Secondary | ICD-10-CM | POA: Diagnosis not present

## 2020-04-01 NOTE — Progress Notes (Signed)
Cephas Darby, MD [address]  [place]  [place], [place] [address]  Main: [phone]  Fax: [phone]    Gastroenterology Consultation  Referring Provider:     Marguerita Merles, MD Primary Care Physician:  Marguerita Merles, MD Primary Gastroenterologist:  Dr. Cephas Darby Reason for Consultation:   Chronic constipation, dyspepsia        HPI:   Janet [name] is a 70 y.o. female referred by Dr. Lennox Grumbles, Connye Burkitt, MD  for consultation & management of chronic constipation, dyspepsia.  Patient reports she has been experiencing consisting of irregular bowel movements with significant straining.  She has been improved use as needed.  Stools normal bowel movements with incomplete emptying.  She denies rectal bleeding, abdominal pain. Stools are generally soft which she takes to stop.  She acknowledges drinking several cans of Pepsi daily.  Not drink water.  She also reports epigastric discomfort, gas, bloating and belching, sometimes regurgitation.  She denies heartburn.  She denies weight loss.  Her labs from last year including CBC, CMP were unremarkable.  TSH normal.  She is found to have renal lesions consistent with carcinoma based on imaging, closely followed by urology at [facility].  She does not drink alcohol, does not smoke S/p hysterectomy Patient reports undergoing stool card test for colon cancer screening annually  Follow-up visit 09/30/2019 Patient underwent colonoscopy 2021, it was otherwise unremarkable except for small polyp.  Patient reports that she tried Linzess 145 MCG samples and it really worked well for constipation.  She did not call our office for a prescription.  She reports that her bowel movements are every 2 to 3 days and are hard.  She is trying to cut back on carbonated beverages.  She denies any abdominal bloating.  She does report intermittent nausea which is seldom.  She does not have any other concerns today  Follow-up visit 04/01/2020 Patient is referred to  me to evaluate for cough with productive sputum.  Patient reports that she was experiencing productive cough with streaks of blood.  She was treated with antibiotic for her infection in her leg, which has helped to resolve productive sputum with streaks of blood.  She continues to have cough, does report postnasal drip.  She thinks her symptoms are due to allergies.  She denies any heartburn, upper abdominal pain, or dysphagia.  She does have constipation.  She could not afford Linzess, taking stool softener as needed.  NSAIDs: None  Antiplts/Anticoagulants/Anti thrombotics: None  GI Procedures:  Colonoscopy 05/25/2019 - One 3 mm polyp in the transverse colon, removed with a cold biopsy forceps. Resected and retrieved. - Diverticulosis in the sigmoid colon. - The distal rectum and anal verge are normal on retroflexion view. DIAGNOSIS:  A. COLON POLYP, TRANSVERSE; COLD BIOPSY:  - TUBULAR ADENOMA.  - NEGATIVE FOR HIGH-GRADE DYSPLASIA AND MALIGNANCY.   Patient reports her mother passed away from colon cancer in her 43s  Past Medical History:  Diagnosis Date  . Asthma   . Cancer ([place])    kidney  . Hypertension     Past Surgical History:  Procedure Laterality Date  . ABDOMINAL HYSTERECTOMY    . COLONOSCOPY WITH PROPOFOL N/A 05/25/2019   Procedure: COLONOSCOPY WITH PROPOFOL;  Surgeon: Lin Landsman, MD;  Location: [facility] ENDOSCOPY;  Service: Gastroenterology;  Laterality: N/A;    Current Outpatient Medications:  .  albuterol (PROVENTIL HFA;VENTOLIN HFA) 108 (90 BASE) MCG/ACT inhaler, Inhale 2 puffs into the lungs every 6 (  six) hours as needed for wheezing or shortness of breath., Disp: 1 Inhaler, Rfl: 0 .  albuterol (VENTOLIN HFA) 108 (90 Base) MCG/ACT inhaler, Inhale 2 puffs into the lungs every 6 (six) hours as needed for wheezing or shortness of breath., Disp: 8 g, Rfl: 1 .  aspirin EC 81 MG tablet, Take 81 mg by mouth daily., Disp: , Rfl:  .  baclofen (LIORESAL) 10 MG tablet,  Take 10 mg by mouth 3 (three) times daily as needed., Disp: , Rfl:  .  Butalbital-APAP-Caffeine 50-300-40 MG CAPS, take 1 to 2 capsules by mouth every 6 hours if needed for headache, Disp: , Rfl:  .  carvedilol (COREG) 6.25 MG tablet, Take 6.25 mg by mouth 2 (two) times daily., Disp: , Rfl:  .  Elastic Bandages & Supports (EMS ANTI-EMBOLISM STOCKINGS) MISC, Apply in the morning and remove at night., Disp: , Rfl:  .  gabapentin (NEURONTIN) 300 MG capsule, Take 300 mg by mouth at bedtime., Disp: , Rfl:  .  lisinopril (PRINIVIL,ZESTRIL) 40 MG tablet, Take 1 tablet by mouth daily., Disp: , Rfl: 0 .  meloxicam (MOBIC) 15 MG tablet, Take 1 tablet (15 mg total) by mouth daily., Disp: 30 tablet, Rfl: 2 .  methocarbamol (ROBAXIN) 500 MG tablet, Take 1 tablet (500 mg total) by mouth 2 (two) times daily., Disp: 20 tablet, Rfl: 0 .  polyethylene glycol powder (GLYCOLAX/MIRALAX) 17 GM/SCOOP powder, , Disp: , Rfl:  .  potassium chloride SA (K-DUR,KLOR-CON) 20 MEQ tablet, Take 20 mEq by mouth daily. , Disp: , Rfl:  .  traMADol (ULTRAM) 50 MG tablet, Take 1 tablet (50 mg total) by mouth every 6 (six) hours as needed., Disp: 15 tablet, Rfl: 0 .  linaclotide (LINZESS) 145 MCG CAPS capsule, Take 1 capsule (145 mcg total) by mouth daily before breakfast., Disp: 30 capsule, Rfl: 5 .  magnesium citrate SOLN, Take 1 Bottle by mouth once. (Patient not taking: Reported on 04/01/2020), Disp: , Rfl:    Family History  Problem Relation Age of Onset  . Colon cancer Mother   . Hypertension Mother   . Heart attack Father 19  . Hypertension Father   . Hyperlipidemia Father   . Diabetes Father   . Stroke Neg Hx   . Breast cancer Neg Hx      Social History   Tobacco Use  . Smoking status: Never Smoker  . Smokeless tobacco: Never Used  Vaping Use  . Vaping Use: Never used  Substance Use Topics  . Alcohol use: No  . Drug use: No    Allergies as of 04/01/2020 - Review Complete 04/01/2020  Allergen Reaction Noted   . Amlodipine Swelling 02/21/2016  . Shellfish allergy Anaphylaxis 12/24/2014  . Thiazide-type diuretics Other (See Comments) 03/05/2012  . Penicillins Hives and Other (See Comments) 09/15/2014    Review of Systems:    All systems reviewed and negative except where noted in HPI.   Physical Exam:  BP (!) 156/79 (BP Location: Left Arm, Patient Position: Sitting, Cuff Size: Normal)   Pulse 81   Temp 98.1 F (36.7 C) (Oral)   Ht 5\' 9"  (1.753 m)   Wt 201 lb 2 oz (91.2 kg)   BMI 29.70 kg/m  No LMP recorded. Patient has had a hysterectomy.  General:   Alert,  Well-developed, well-nourished, pleasant and cooperative in NAD Head:  Normocephalic and atraumatic. Eyes:  Sclera clear, no icterus.   Conjunctiva pink. Ears:  Normal auditory acuity. Nose:  No deformity, discharge,  or lesions. Mouth:  No deformity or lesions,oropharynx pink & moist. Neck:  Supple; no masses or thyromegaly. Lungs:  Respirations even and unlabored.  Clear throughout to auscultation.   No wheezes, crackles, or rhonchi. No acute distress. Heart:  Regular rate and rhythm; no murmurs, clicks, rubs, or gallops. Abdomen:  Normal bowel sounds. Soft, obese, mild lower abdominal tenderness, worse in the lower quadrant and non-distended without masses, hepatosplenomegaly or hernias noted.  No guarding or rebound tenderness.   Rectal: Not performed Msk:  Symmetrical without gross deformities. Good, equal movement & strength bilaterally. Pulses:  Normal pulses noted. Extremities:  No clubbing or edema.  No cyanosis. Neurologic:  Alert and oriented x3;  grossly normal neurologically. Skin:  Intact without significant lesions or rashes. No jaundice. Psych:  Alert and cooperative. Normal mood and affect.  Imaging Studies: Reviewed  Assessment and Plan:   Janet [name] is a 70 y.o. female with BMI 35, hypertension is seen in consultation for productive cough  Productive cough, likely bronchitis which has improved with  antibiotics She may also have allergies as she reports postnasal drip Suggested her to try antiallergy medication and follow-up with PCP Her symptoms are not related to dyspepsia or GERD Do not recommend any further work-up at this time   Dyspepsia: Secondary to carbonated beverages Currently resolved   Follow up as needed   Cephas Darby, MD

## 2020-04-02 NOTE — ED Provider Notes (Signed)
[facility]    CSN: [id number] Arrival date & time: 03/28/20  1808      History   Chief Complaint Chief Complaint  Patient presents with  . Flank Pain    HPI Janet [name] is a 70 y.o. female.   Patient pleasant 70 year old female who presents for evaluation of the above issue.  She reports right-sided low back and flank pain for about 4 days.  It seems to be worse with movement.  She said that she twisted awkwardly and felt a popping sensation.  She also reports some minimal urinary symptoms.  No fever shakes chills.  No nausea vomiting diarrhea.  No radiation of any symptoms down her legs.  No numbness or tingling.  She admits that she does not drink enough fluids and wonders whether or not she got a little bit behind on her fluid intake.  Normally sees [facility] for her ongoing medical care.  She denies any blood in her urine.  No history of renal stones or UTIs.  No abdominal pain.  No fever shakes chills, no nausea vomiting or diarrhea.  No chest pain or shortness of breath.  No red flag signs or symptoms elicited on history.     Past Medical History:  Diagnosis Date  . Asthma   . Cancer ([place])    kidney  . Hypertension     Patient Active Problem List   Diagnosis Date Noted  . Numbness and tingling of left lower extremity 06/23/2018  . Essential hypertension 05/02/2018  . Swelling of limb 05/02/2018  . 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 Left hip pain 11/01/2017  . Headache disorder 08/29/2017  . Premature ventricular contractions 05/25/2016  . Asthma in adult without complication 08/28/4816  . Heart palpitations 04/23/2016  . History of kidney cancer 04/23/2016  . Bilateral cellulitis of lower leg 10/03/2015  . Renal mass, left 02/24/2015  . Thrombophlebitis 09/29/2014  . Chest pressure 09/12/2014  . Dyspnea on exertion 09/12/2014  . Generalized weakness 09/09/2014  . 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    Past Surgical  History:  Procedure Laterality Date  . ABDOMINAL HYSTERECTOMY    . COLONOSCOPY WITH PROPOFOL N/A 05/25/2019   Procedure: COLONOSCOPY WITH PROPOFOL;  Surgeon: Lin Landsman, MD;  Location: [facility] ENDOSCOPY;  Service: Gastroenterology;  Laterality: N/A;    OB History    Gravida  3   Para      Term      Preterm      AB      Living  2     SAB      IAB      Ectopic      Multiple      Live Births               Home Medications    Prior to Admission medications   Medication Sig Start Date End Date Taking? Authorizing Provider  albuterol (PROVENTIL HFA;VENTOLIN HFA) 108 (90 BASE) MCG/ACT inhaler Inhale 2 puffs into the lungs every 6 (six) hours as needed for wheezing or shortness of breath. 12/24/14  Yes [name], MD  albuterol (VENTOLIN HFA) 108 (90 Base) MCG/ACT inhaler Inhale 2 puffs into the lungs every 6 (six) hours as needed for wheezing or shortness of breath. 05/09/19  Yes Merlyn Lot, MD  aspirin EC 81 MG tablet Take 81 mg by mouth daily.   Yes [provider]  Butalbital-APAP-Caffeine 50-300-40 MG CAPS take 1 to 2 capsules by  mouth every 6 hours if needed for headache 07/30/17  Yes [provider]  carvedilol (COREG) 6.25 MG tablet Take 6.25 mg by mouth 2 (two) times daily. 03/25/19  Yes [provider]  Elastic Bandages & Supports (EMS ANTI-EMBOLISM STOCKINGS) MISC Apply in the morning and remove at night. 06/23/14  Yes [provider]  lisinopril (PRINIVIL,ZESTRIL) 40 MG tablet Take 1 tablet by mouth daily. 08/27/14  Yes [provider]  methocarbamol (ROBAXIN) 500 MG tablet Take 1 tablet (500 mg total) by mouth 2 (two) times daily. 03/28/20  Yes Verda Cumins, MD  baclofen (LIORESAL) 10 MG tablet Take 10 mg by mouth 3 (three) times daily as needed. 03/21/20   [provider]  gabapentin (NEURONTIN) 300 MG capsule Take 300 mg by mouth at bedtime. 02/18/20   [provider]  linaclotide  Rolan Lipa) 145 MCG CAPS capsule Take 1 capsule (145 mcg total) by mouth daily before breakfast. 09/30/19 10/30/19  Lin Landsman, MD  magnesium citrate SOLN Take 1 Bottle by mouth once. Patient not taking: Reported on 04/01/2020 05/06/19   [provider]  meloxicam (MOBIC) 15 MG tablet Take 1 tablet (15 mg total) by mouth daily. 02/22/20 02/21/21  Caryn Section, Linden Dolin, PA-C  polyethylene glycol powder (GLYCOLAX/MIRALAX) 17 GM/SCOOP powder  04/24/19   [provider]  potassium chloride SA (K-DUR,KLOR-CON) 20 MEQ tablet Take 20 mEq by mouth daily.     [provider]  traMADol (ULTRAM) 50 MG tablet Take 1 tablet (50 mg total) by mouth every 6 (six) hours as needed. 02/22/20   Caryn Section Linden Dolin, PA-C    Family History Family History  Problem Relation Age of Onset  . Colon cancer Mother   . Hypertension Mother   . Heart attack Father 81  . Hypertension Father   . Hyperlipidemia Father   . Diabetes Father   . Stroke Neg Hx   . Breast cancer Neg Hx     Social History Social History   Tobacco Use  . Smoking status: Never Smoker  . Smokeless tobacco: Never Used  Vaping Use  . Vaping Use: Never used  Substance Use Topics  . Alcohol use: No  . Drug use: No     Allergies   Amlodipine, Shellfish allergy, Thiazide-type diuretics, and Penicillins   Review of Systems Review of Systems  Constitutional: Positive for activity change. Negative for appetite change, chills, diaphoresis, fatigue and fever.  HENT: Negative.  Negative for congestion, ear pain, sinus pain and sore throat.   Eyes: Negative.  Negative for pain.  Respiratory: Negative.  Negative for cough, chest tightness, shortness of breath and wheezing.   Cardiovascular: Negative.  Negative for chest pain and palpitations.  Gastrointestinal: Negative.  Negative for abdominal pain, blood in stool, constipation, diarrhea, nausea and vomiting.  Genitourinary: Positive for flank pain. Negative for dysuria,  frequency, hematuria, pelvic pain, urgency, vaginal bleeding, vaginal discharge and vaginal pain.  Musculoskeletal: Positive for back pain and gait problem. Negative for arthralgias, myalgias, neck pain and neck stiffness.  Neurological: Negative for dizziness, tremors, syncope, weakness, light-headedness, numbness and headaches.  All other systems reviewed and are negative.    Physical Exam Triage Vital Signs ED Triage Vitals  Enc Vitals Group     BP 03/28/20 1937 (!) 159/76     Pulse Rate 03/28/20 1937 71     Resp 03/28/20 1937 18     Temp 03/28/20 1937 98.1 F (36.7 C)     Temp Source 03/28/20 1937 Oral  SpO2 03/28/20 1937 97 %     Weight --      Height --      Head Circumference --      Peak Flow --      Pain Score 03/28/20 1927 8     Pain Loc --      Pain Edu? --      Excl. in [place]? --    No data found.  Updated Vital Signs BP (!) 159/76 (BP Location: Left Arm)   Pulse 71   Temp 98.1 F (36.7 C) (Oral)   Resp 18   SpO2 97%   Visual Acuity Right Eye Distance:   Left Eye Distance:   Bilateral Distance:    Right Eye Near:   Left Eye Near:    Bilateral Near:     Physical Exam Vitals and nursing note reviewed.  Constitutional:      General: She is not in acute distress.    Appearance: Normal appearance. She is not ill-appearing, toxic-appearing or diaphoretic.  HENT:     Head: Normocephalic and atraumatic.     Nose: Nose normal. No congestion or rhinorrhea.     Mouth/Throat:     Mouth: Mucous membranes are moist.  Cardiovascular:     Rate and Rhythm: Normal rate and regular rhythm.     Pulses: Normal pulses.     Heart sounds: Normal heart sounds. No murmur heard. No friction rub. No gallop.   Pulmonary:     Effort: Pulmonary effort is normal. No respiratory distress.     Breath sounds: Normal breath sounds. No stridor. No wheezing, rhonchi or rales.  Abdominal:     Palpations: Abdomen is soft.     Tenderness: There is no abdominal tenderness. There  is no right CVA tenderness or left CVA tenderness.     Comments: Kidneys are not ballotable bilaterally.  Musculoskeletal:        General: Tenderness present. No swelling, deformity or signs of injury.     Cervical back: Normal range of motion and neck supple. No rigidity.     Comments: Decreased ROM with forward flexion, lateral side bends, rotation and extension due to pain.  TTP right lumbar area with muscle spasm.  No strength deficits.  No parasthesia B/L LE's.   Skin:    General: Skin is warm and dry.     Capillary Refill: Capillary refill takes less than 2 seconds.     Coloration: Skin is not jaundiced.     Findings: No erythema, lesion or rash.  Neurological:     General: No focal deficit present.     Mental Status: She is alert and oriented to person, place, and time.      UC Treatments / Results  Labs (all labs ordered are listed, but only abnormal results are displayed) Labs Reviewed  URINALYSIS, COMPLETE (UACMP) WITH MICROSCOPIC - Abnormal; Notable for the following components:      Result Value   APPearance HAZY (*)    Ketones, ur TRACE (*)    Bacteria, UA FEW (*)    All other components within normal limits    EKG   Radiology No results found.  Procedures Procedures (including critical care time)  Medications Ordered in UC Medications - No data to display  Initial Impression / Assessment and Plan / UC Course  I have reviewed the triage vital signs and the nursing notes.  Pertinent labs & imaging results that were available during my care of the patient were reviewed  by me and considered in my medical decision making (see chart for details).  Clinical impression: Right-sided low back pain without sciatica.  She also has some right flank pain.  Treatment plan: 1.  The findings and treatment plan were discussed in detail with the patient.  Patient was in agreement. 2.  Recommended getting a UA.  Results are above.  Does show a few bacteria.  Probably a  contaminant.  No evidence of UTI. 3.  Her situation seems most consistent with a musculoskeletal issue.  I did review her previous visits and it appears as though she had a similar situation about a year ago.  We will treat accordingly. 4.  Gave her a prescription for methocarbamol. 5.  Over-the-counter anti-inflammatories and/or Tylenol as needed. 6.  Educational handouts provided. 7.  Heat and/or ice to that area and just monitor for symptom progression. 8.  If her symptoms do not improve of asked her to go to the primary care office.  She may benefit from physical therapy.  If they worsen in any way she should go to the emergency room. 9.  Follow-up here as needed.    Final Clinical Impressions(s) / UC Diagnoses   Final diagnoses:  Acute right-sided low back pain without sciatica  Spasm of lumbar paraspinous muscle  Decreased range of motion of lumbar spine  Right flank pain     Discharge Instructions     Your urine does not show that you have a urinary tract infection. Your exam is most consistent with a lumbar strain with muscle spasm. I have sent in a prescription for a muscle relaxer. You can use over-the-counter anti-inflammatories as needed. You can heat and ice alternating each to help your symptoms. Please make an appointment with your primary care physician for follow-up.  You may need some physical therapy.  I hope you get to feeling better, Dr. Drema Dallas    ED Prescriptions    Medication Sig Dispense Auth. Provider   methocarbamol (ROBAXIN) 500 MG tablet Take 1 tablet (500 mg total) by mouth 2 (two) times daily. 20 tablet Verda Cumins, MD     PDMP not reviewed this encounter.   Verda Cumins, MD 04/02/20 2110

## 2020-05-27 ENCOUNTER — Telehealth (INDEPENDENT_AMBULATORY_CARE_PROVIDER_SITE_OTHER): Payer: Self-pay | Admitting: Vascular Surgery

## 2020-05-27 NOTE — Telephone Encounter (Signed)
Called stating that patient hit her leg on the bed and now there is a sore on her leg. Patient is taking tylenol for pain. Patient would like to come in to be seen. Patient was last seen 11/2019 as a 58mo f/u with FB. Please advise.

## 2020-05-27 NOTE — Telephone Encounter (Signed)
I Will call and  make patient aware of your notes.

## 2020-05-27 NOTE — Telephone Encounter (Signed)
Because this was a traumatic wound, we recommend her seeing her primary care for initial evaluation.

## 2020-05-30 ENCOUNTER — Ambulatory Visit
Admission: EM | Admit: 2020-05-30 | Discharge: 2020-05-30 | Disposition: A | Discharge status: Home / Self Care | Payer: Medicare Other | Attending: Family Medicine | Admitting: Family Medicine

## 2020-05-30 DIAGNOSIS — I872 Venous insufficiency (chronic) (peripheral): Secondary | ICD-10-CM | POA: Diagnosis not present

## 2020-05-30 DIAGNOSIS — S81802A Unspecified open wound, left lower leg, initial encounter: Secondary | ICD-10-CM | POA: Diagnosis not present

## 2020-05-30 MED ORDER — MUPIROCIN 2 % EX OINT: 1.0000 "application " | TOPICAL_OINTMENT | Freq: Three times a day (TID) | CUTANEOUS | 0 refills | Status: AC

## 2020-05-30 NOTE — Discharge Instructions (Signed)
Medication as prescribed.  If persists, see your vein specialist.  Take care  Dr. Lacinda Axon

## 2020-05-30 NOTE — ED Provider Notes (Signed)
[facility]    CSN: [id number] Arrival date & time: 05/30/20  1801      History   Chief Complaint Chief Complaint  Patient presents with  . Leg Pain   HPI  70 year old female presents with leg pain.  Patient reports that she hit her left lower leg on a car last week.  She has a small open wound.  She states that she is having pain of her lower extremity.  She is concerned that the wound may be infected.  She has an extensive history of venous insufficiency and prior wounds/ulcerations.  No fever.  No relieving factors.  No other complaints.   Past Medical History:  Diagnosis Date  . Asthma   . Cancer ([place])    kidney  . Hypertension     Patient Active Problem List   Diagnosis Date Noted  . Numbness and tingling of left lower extremity 06/23/2018  . Essential hypertension 05/02/2018  . Swelling of limb 05/02/2018  . 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 Left hip pain 11/01/2017  . Headache disorder 08/29/2017  . Premature ventricular contractions 05/25/2016  . Asthma in adult without complication 71/06/2692  . Heart palpitations 04/23/2016  . History of kidney cancer 04/23/2016  . Bilateral cellulitis of lower leg 10/03/2015  . Renal mass, left 02/24/2015  . Thrombophlebitis 09/29/2014  . Chest pressure 09/12/2014  . Dyspnea on exertion 09/12/2014  . Generalized weakness 09/09/2014  . 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 ABDOMINAL HYSTERECTOMY    . COLONOSCOPY WITH PROPOFOL N/A 05/25/2019   Procedure: COLONOSCOPY WITH PROPOFOL;  Surgeon: Lin Landsman, MD;  Location: [facility] ENDOSCOPY;  Service: Gastroenterology;  Laterality: N/A;    OB History    Gravida  3   Para      Term      Preterm      AB      Living  2     SAB      IAB      Ectopic      Multiple      Live Births               Home Medications    Prior to Admission medications   Medication Sig  Start Date End Date Taking? Authorizing Provider  albuterol (PROVENTIL HFA;VENTOLIN HFA) 108 (90 BASE) MCG/ACT inhaler Inhale 2 puffs into the lungs every 6 (six) hours as needed for wheezing or shortness of breath. 12/24/14  Yes [name], MD  albuterol (VENTOLIN HFA) 108 (90 Base) MCG/ACT inhaler Inhale 2 puffs into the lungs every 6 (six) hours as needed for wheezing or shortness of breath. 05/09/19  Yes Merlyn Lot, MD  aspirin EC 81 MG tablet Take 81 mg by mouth daily.   Yes [provider]  baclofen (LIORESAL) 10 MG tablet Take 10 mg by mouth 3 (three) times daily as needed. 03/21/20  Yes [provider]  Butalbital-APAP-Caffeine 50-300-40 MG CAPS take 1 to 2 capsules by mouth every 6 hours if needed for headache 07/30/17  Yes [provider]  carvedilol (COREG) 6.25 MG tablet Take 6.25 mg by mouth 2 (two) times daily. 03/25/19  Yes [provider]  Elastic Bandages & Supports (EMS ANTI-EMBOLISM STOCKINGS) MISC Apply in the morning and remove at night. 06/23/14  Yes [provider]  gabapentin (NEURONTIN) 300 MG capsule Take 300 mg by mouth at bedtime. 02/18/20  Yes [provider]  lisinopril (PRINIVIL,ZESTRIL) 40 MG tablet Take 1 tablet by mouth daily. 08/27/14  Yes [provider]  meloxicam (MOBIC) 15 MG tablet Take 1 tablet (15 mg total) by mouth daily. 02/22/20 02/21/21 Yes Fisher, Linden Dolin, PA-C  methocarbamol (ROBAXIN) 500 MG tablet Take 1 tablet (500 mg total) by mouth 2 (two) times daily. 03/28/20  Yes Verda Cumins, MD  mupirocin ointment (BACTROBAN) 2 % Apply 1 application topically 3 (three) times daily for 7 days. 05/30/20 06/06/20 Yes Makalah Asberry G, DO  polyethylene glycol powder (GLYCOLAX/MIRALAX) 17 GM/SCOOP powder  04/24/19  Yes [provider]  potassium chloride SA (K-DUR,KLOR-CON) 20 MEQ tablet Take 20 mEq by mouth daily.    Yes [provider]  linaclotide Rolan Lipa) 145 MCG CAPS capsule Take 1  capsule (145 mcg total) by mouth daily before breakfast. 09/30/19 05/30/20  Lin Landsman, MD    Family History Family History  Problem Relation Age of Onset  . Colon cancer Mother   . Hypertension Mother   . Heart attack Father 59  . Hypertension Father   . Hyperlipidemia Father   . Diabetes Father   . Stroke Neg Hx   . Breast cancer Neg Hx     Social History Social History   Tobacco Use  . Smoking status: Never Smoker  . Smokeless tobacco: Never Used  Vaping Use  . Vaping Use: Never used  Substance Use Topics  . Alcohol use: No  . Drug use: No     Allergies   Amlodipine, Shellfish allergy, Thiazide-type diuretics, and Penicillins   Review of Systems Review of Systems Per HPI  Physical Exam Triage Vital Signs ED Triage Vitals  Enc Vitals Group     BP 05/30/20 1827 (!) 144/67     Pulse Rate 05/30/20 1827 73     Resp 05/30/20 1827 18     Temp 05/30/20 1827 98.3 F (36.8 C)     Temp Source 05/30/20 1827 Oral     SpO2 05/30/20 1827 100 %     Weight 05/30/20 1826 209 lb (94.8 kg)     Height --      Head Circumference --      Peak Flow --      Pain Score 05/30/20 1825 8     Pain Loc --      Pain Edu? --      Excl. in [place]? --    No data found.  Updated Vital Signs BP (!) 144/67 (BP Location: Left Arm)   Pulse 73   Temp 98.3 F (36.8 C) (Oral)   Resp 18   Wt 94.8 kg   SpO2 100%   BMI 30.86 kg/m   Visual Acuity Right Eye Distance:   Left Eye Distance:   Bilateral Distance:    Right Eye Near:   Left Eye Near:    Bilateral Near:     Physical Exam Constitutional:      General: She is not in acute distress.    Appearance: Normal appearance.  HENT:     Head: Normocephalic and atraumatic.  Eyes:     General:        Right eye: No discharge.        Left eye: No discharge.     Conjunctiva/sclera: Conjunctivae normal.  Skin:    Comments: Left lower extremity -extensive hyperpigmentation secondary to chronic venous insufficiency.  There is a  small open wound which appears to be healing.  Mild eschar.  No surrounding erythema.  Her  leg is diffusely tender to palpation.  The tenderness is out of proportion.  Neurological:     Mental Status: She is alert.  Psychiatric:        Mood and Affect: Mood normal.        Behavior: Behavior normal.    UC Treatments / Results  Labs (all labs ordered are listed, but only abnormal results are displayed) Labs Reviewed - No data to display  EKG   Radiology No results found.  Procedures Procedures (including critical care time)  Medications Ordered in UC Medications - No data to display  Initial Impression / Assessment and Plan / UC Course  I have reviewed the triage vital signs and the nursing notes.  Pertinent labs & imaging results that were available during my care of the patient were reviewed by me and considered in my medical decision making (see chart for details).    70 year old female presents with a small wound to her lower extremity in the setting of chronic venous insufficiency.  Bactroban ointment as prescribed.  Advised watchful waiting and conservative care.  If persists should see her vein specialist.  Final Clinical Impressions(s) / UC Diagnoses   Final diagnoses:  Chronic venous insufficiency  Open wound of left lower extremity, initial encounter     Discharge Instructions     Medication as prescribed.  If persists, see your vein specialist.  Take care  Dr. Lacinda Axon     ED Prescriptions    Medication Sig [place]. Provider   mupirocin ointment (BACTROBAN) 2 % Apply 1 application topically 3 (three) times daily for 7 days. 30 g Coral Spikes, DO     PDMP not reviewed this encounter.   Coral Spikes, [place] 05/30/20 2057

## 2020-05-30 NOTE — ED Triage Notes (Signed)
Pt c/o leg wound on her right lower leg, hit it on the car and states it is painful and concerned for infection.

## 2020-06-06 ENCOUNTER — Other Ambulatory Visit: Payer: Self-pay | Admitting: Family Medicine

## 2020-06-06 DIAGNOSIS — M810 Age-related osteoporosis without current pathological fracture: Secondary | ICD-10-CM

## 2020-06-06 DIAGNOSIS — Z13820 Encounter for screening for osteoporosis: Secondary | ICD-10-CM

## 2020-06-15 ENCOUNTER — Encounter (INDEPENDENT_AMBULATORY_CARE_PROVIDER_SITE_OTHER): Payer: Self-pay | Admitting: Nurse Practitioner

## 2020-06-15 ENCOUNTER — Ambulatory Visit (INDEPENDENT_AMBULATORY_CARE_PROVIDER_SITE_OTHER): Payer: Medicare Other | Admitting: Nurse Practitioner

## 2020-06-15 ENCOUNTER — Other Ambulatory Visit: Payer: Self-pay

## 2020-06-15 VITALS — BP 149/74 | HR 90 | Ht 69.0 in | Wt 194.0 lb

## 2020-06-15 DIAGNOSIS — R200 Anesthesia of skin: Secondary | ICD-10-CM

## 2020-06-15 DIAGNOSIS — L97929 Non-pressure chronic ulcer of unspecified part of left lower leg with unspecified severity: Secondary | ICD-10-CM

## 2020-06-15 DIAGNOSIS — I10 Essential (primary) hypertension: Secondary | ICD-10-CM

## 2020-06-15 DIAGNOSIS — I83029 Varicose veins of left lower extremity with ulcer of unspecified site: Secondary | ICD-10-CM

## 2020-06-15 DIAGNOSIS — R202 Paresthesia of skin: Secondary | ICD-10-CM

## 2020-06-15 MED ORDER — SULFAMETHOXAZOLE-TRIMETHOPRIM 800-160 MG PO TABS: 1.0000 | ORAL_TABLET | Freq: Two times a day (BID) | ORAL | 0 refills | Status: DC

## 2020-06-15 MED ORDER — TRAMADOL HCL 50 MG PO TABS: 50.0000 mg | ORAL_TABLET | Freq: Four times a day (QID) | ORAL | 0 refills | Status: DC | PRN

## 2020-06-20 ENCOUNTER — Encounter (INDEPENDENT_AMBULATORY_CARE_PROVIDER_SITE_OTHER): Payer: Self-pay | Admitting: Nurse Practitioner

## 2020-06-20 NOTE — Progress Notes (Signed)
Subjective:    Patient ID: Janet [name], female    DOB: 10-27-1950, 70 y.o.   MRN: [id number] Chief Complaint  Patient presents with  . Follow-up    1 yr no studies    Patient is seen for evaluation of leg pain and swelling associated with new onset ulceration. The patient first noticed the swelling remotely. The swelling is associated with pain and discoloration. The pain and swelling worsens with prolonged dependency and improves with elevation. The pain is unrelated to activity.  The patient notes that in the morning the legs are better but the leg symptoms worsened throughout the course of the day. The patient has also noted a progressive worsening of the discoloration in the ankle and shin area.   The patient notes that an ulcer has developed acutely without specific trauma and since it occurred it has been very slow to heal.  There is a moderate amount of drainage associated with the open area.  The wound is also very painful.  The patient denies claudication symptoms or rest pain symptoms.  The patient denies DJD and LS spine disease.  The patient has not had any past angiography, interventions or vascular surgery.  Elevation makes the leg symptoms better, dependency makes them much worse. The patient denies any recent changes in medications.  The patient has not been wearing graduated compression.  The patient denies a history of DVT or PE. There is no prior history of phlebitis. There is a history of lymphedema  No history of malignancies. No history of trauma or groin or pelvic surgery. There is no history of radiation treatment to the groin or pelvis        Review of Systems  Cardiovascular: Positive for leg swelling.  Skin: Positive for wound.  All other systems reviewed and are negative.      Objective:   Physical Exam Vitals reviewed.  HENT:     Head: Normocephalic.  Cardiovascular:     Rate and Rhythm: Normal rate.     Pulses: Normal pulses.  Pulmonary:      Effort: Pulmonary effort is normal.  Skin:    General: Skin is warm and dry.  Neurological:     Mental Status: She is alert and oriented to person, place, and time.  Psychiatric:        Mood and Affect: Mood normal.        Behavior: Behavior normal.        Thought Content: Thought content normal.        Judgment: Judgment normal.     BP (!) 149/74   Pulse 90   Ht 5\' 9"  (1.753 m)   Wt 194 lb (88 kg)   BMI 28.65 kg/m   Past Medical History:  Diagnosis Date  . Asthma   . Cancer ([place])    kidney  . Hypertension     Social History   Socioeconomic History  . Marital status: Married    Spouse name: Not on file  . Number of children: Not on file  . Years of education: Not on file  . Highest education level: Not on file  Occupational History  . Not on file  Tobacco Use  . Smoking status: Never Smoker  . Smokeless tobacco: Never Used  Vaping Use  . Vaping Use: Never used  Substance and Sexual Activity  . Alcohol use: No  . Drug use: No  . Sexual activity: Not on file  Other Topics Concern  . Not on  file  Social History Narrative  . Not on file   Social Determinants of Health   Financial Resource Strain: Not on file  Food Insecurity: Not on file  Transportation Needs: Not on file  Physical Activity: Not on file  Stress: Not on file  Social Connections: Not on file  Intimate Partner Violence: Not on file    Past Surgical History:  Procedure Laterality Date  . ABDOMINAL HYSTERECTOMY    . COLONOSCOPY WITH PROPOFOL N/A 05/25/2019   Procedure: COLONOSCOPY WITH PROPOFOL;  Surgeon: Lin Landsman, MD;  Location: [facility] ENDOSCOPY;  Service: Gastroenterology;  Laterality: N/A;    Family History  Problem Relation Age of Onset  . Colon cancer Mother   . Hypertension Mother   . Heart attack Father 61  . Hypertension Father   . Hyperlipidemia Father   . Diabetes Father   . Stroke Neg Hx   . Breast cancer Neg Hx     Allergies  Allergen Reactions  .  Amlodipine Swelling    Other reaction(s): Angioedema  . Shellfish Allergy Anaphylaxis  . Thiazide-Type Diuretics Other (See Comments)    Other reaction(s): Other (See Comments)  . Penicillins Hives and Other (See Comments)    Has patient had a PCN reaction causing immediate rash, facial/tongue/throat swelling, SOB or lightheadedness with hypotension: no Has patient had a PCN reaction causing severe rash involving mucus membranes or skin necrosis: no Has patient had a PCN reaction that required hospitalization? no Has patient had a PCN reaction occurring within the last 10 years: no If all of the above answers are "NO", then may proceed with Cephalosporin use.     CBC Latest Ref Rng & Units 05/08/2019 02/19/2018 06/23/2017  WBC 4.0 - 10.5 K/uL 7.0 6.9 7.0  Hemoglobin 12.0 - 15.0 g/dL 12.5 13.1 14.0  Hematocrit 36.0 - 46.0 % 39.1 41.6 41.5  Platelets 150 - 400 K/uL 307 335 351      CMP     Component Value Date/Time   NA 139 05/08/2019 2037   NA 141 04/12/2014 1225   K 3.9 05/08/2019 2037   K 3.4 (L) 04/12/2014 1225   CL 108 05/08/2019 2037   CL 109 04/12/2014 1225   CO2 21 (L) 05/08/2019 2037   CO2 25 04/12/2014 1225   GLUCOSE 96 05/08/2019 2037   GLUCOSE 95 04/12/2014 1225   BUN 15 05/08/2019 2037   BUN 17 04/12/2014 1225   CREATININE 0.95 05/08/2019 2037   CREATININE 0.64 04/12/2014 1225   CALCIUM 9.7 05/08/2019 2037   CALCIUM 9.2 04/12/2014 1225   PROT 8.2 (H) 02/19/2018 2220   PROT 8.2 03/11/2012 1133   ALBUMIN 4.1 02/19/2018 2220   ALBUMIN 3.6 03/11/2012 1133   AST 18 02/19/2018 2220   AST 19 03/11/2012 1133   ALT 12 02/19/2018 2220   ALT 16 03/11/2012 1133   ALKPHOS 47 02/19/2018 2220   ALKPHOS 62 03/11/2012 1133   BILITOT 0.6 02/19/2018 2220   BILITOT 0.5 03/11/2012 1133   GFRNONAA >60 05/08/2019 2037   GFRNONAA >60 04/12/2014 1225   GFRAA >60 05/08/2019 2037   GFRAA >60 04/12/2014 1225     No results found.     Assessment & Plan:   1. Varicose veins  with ulcer, left ([place]) No surgery or intervention at this point in time.    I have had a long discussion with the patient regarding venous insufficiency and why it  causes symptoms, specifically venous ulceration . I have discussed with the  patient the chronic skin changes that accompany venous insufficiency and the long term sequela such as infection and recurring  ulceration.  Patient will be placed in [employer] which will be changed weekly drainage permitting.  In addition, behavioral modification including several periods of elevation of the lower extremities during the day will be continued. Achieving a position with the ankles at heart level was stressed to the patient  The patient is instructed to begin routine exercise, especially walking on a daily basis  Patient return to the office in 4 weeks to evaluate progress with ulceration.  The patient can be assessed for graduated compression stockings or wraps as well as a Lymph Pump once the ulcers are healed.   - traMADol (ULTRAM) 50 MG tablet; Take 1 tablet (50 mg total) by mouth every 6 (six) hours as needed.  Dispense: 20 tablet; Refill: 0 - sulfamethoxazole-trimethoprim (BACTRIM DS) 800-160 MG tablet; Take 1 tablet by mouth 2 (two) times daily.  Dispense: 14 tablet; Refill: 0  2. Essential hypertension Continue antihypertensive medications as already ordered, these medications have been reviewed and there are no changes at this time.   3. Numbness and tingling of left lower extremity Discussed with patient that she will need to follow-up with her PCP.  We have done multiple noninvasive studies and it is likely that the patient has some evidence of neuropathy.  Patient will need to discuss with PCP adjustment of her gabapentin.   Current Outpatient Medications on File Prior to Visit  Medication Sig Dispense Refill  . albuterol (PROVENTIL HFA;VENTOLIN HFA) 108 (90 BASE) MCG/ACT inhaler Inhale 2 puffs into the lungs every 6 (six) hours  as needed for wheezing or shortness of breath. 1 Inhaler 0  . albuterol (VENTOLIN HFA) 108 (90 Base) MCG/ACT inhaler Inhale 2 puffs into the lungs every 6 (six) hours as needed for wheezing or shortness of breath. 8 g 1  . aspirin EC 81 MG tablet Take 81 mg by mouth daily.    . baclofen (LIORESAL) 10 MG tablet Take 10 mg by mouth 3 (three) times daily as needed.    . Butalbital-APAP-Caffeine 50-300-40 MG CAPS take 1 to 2 capsules by mouth every 6 hours if needed for headache    . carvedilol (COREG) 6.25 MG tablet Take 6.25 mg by mouth 2 (two) times daily.    Regino Schultze Bandages & Supports (EMS ANTI-EMBOLISM STOCKINGS) MISC Apply in the morning and remove at night.    . gabapentin (NEURONTIN) 300 MG capsule Take 300 mg by mouth at bedtime.    Marland Kitchen lisinopril (PRINIVIL,ZESTRIL) 40 MG tablet Take 1 tablet by mouth daily.  0  . meloxicam (MOBIC) 15 MG tablet Take 1 tablet (15 mg total) by mouth daily. 30 tablet 2  . methocarbamol (ROBAXIN) 500 MG tablet Take 1 tablet (500 mg total) by mouth 2 (two) times daily. 20 tablet 0  . polyethylene glycol powder (GLYCOLAX/MIRALAX) 17 GM/SCOOP powder     . potassium chloride SA (K-DUR,KLOR-CON) 20 MEQ tablet Take 20 mEq by mouth daily.     . [DISCONTINUED] linaclotide (LINZESS) 145 MCG CAPS capsule Take 1 capsule (145 mcg total) by mouth daily before breakfast. 30 capsule 5   No current facility-administered medications on file prior to visit.    There are no Patient Instructions on file for this visit. No follow-ups on file.   Kris Hartmann, NP

## 2020-06-22 ENCOUNTER — Encounter (INDEPENDENT_AMBULATORY_CARE_PROVIDER_SITE_OTHER): Payer: Self-pay

## 2020-06-22 ENCOUNTER — Other Ambulatory Visit: Payer: Self-pay

## 2020-06-22 ENCOUNTER — Ambulatory Visit (INDEPENDENT_AMBULATORY_CARE_PROVIDER_SITE_OTHER): Payer: Medicare Other | Admitting: Nurse Practitioner

## 2020-06-22 VITALS — BP 148/69 | HR 85 | Resp 16 | Wt 193.6 lb

## 2020-06-22 DIAGNOSIS — L97929 Non-pressure chronic ulcer of unspecified part of left lower leg with unspecified severity: Secondary | ICD-10-CM

## 2020-06-22 DIAGNOSIS — I83029 Varicose veins of left lower extremity with ulcer of unspecified site: Secondary | ICD-10-CM

## 2020-06-22 NOTE — Progress Notes (Signed)
History of Present Illness  There is no documented history at this time  Assessments & Plan   There are no diagnoses linked to this encounter.    Additional instructions  Subjective:  Patient presents with venous ulcer of the Left lower extremity.    Procedure:  3 layer unna wrap was placed Left lower extremity.   Plan:   Follow up in one week.  

## 2020-06-29 ENCOUNTER — Encounter (INDEPENDENT_AMBULATORY_CARE_PROVIDER_SITE_OTHER): Payer: Self-pay

## 2020-06-29 ENCOUNTER — Other Ambulatory Visit: Payer: Self-pay

## 2020-06-29 ENCOUNTER — Ambulatory Visit (INDEPENDENT_AMBULATORY_CARE_PROVIDER_SITE_OTHER): Payer: Medicare Other | Admitting: Nurse Practitioner

## 2020-06-29 VITALS — BP 105/67 | HR 77 | Resp 16 | Wt 193.4 lb

## 2020-06-29 DIAGNOSIS — L97929 Non-pressure chronic ulcer of unspecified part of left lower leg with unspecified severity: Secondary | ICD-10-CM | POA: Diagnosis not present

## 2020-06-29 DIAGNOSIS — I83029 Varicose veins of left lower extremity with ulcer of unspecified site: Secondary | ICD-10-CM

## 2020-06-29 NOTE — Progress Notes (Signed)
History of Present Illness  There is no documented history at this time  Assessments & Plan   There are no diagnoses linked to this encounter.    Additional instructions  Subjective:  Patient presents with venous ulcer of the Left lower extremity.    Procedure:  3 layer unna wrap was placed Left lower extremity.   Plan:   Follow up in one week.  

## 2020-07-04 ENCOUNTER — Encounter (INDEPENDENT_AMBULATORY_CARE_PROVIDER_SITE_OTHER): Payer: Self-pay | Admitting: Nurse Practitioner

## 2020-07-06 ENCOUNTER — Other Ambulatory Visit: Payer: Self-pay

## 2020-07-06 ENCOUNTER — Ambulatory Visit (INDEPENDENT_AMBULATORY_CARE_PROVIDER_SITE_OTHER): Payer: Medicare Other | Admitting: Nurse Practitioner

## 2020-07-06 ENCOUNTER — Encounter (INDEPENDENT_AMBULATORY_CARE_PROVIDER_SITE_OTHER): Payer: Self-pay

## 2020-07-06 VITALS — BP 123/76 | HR 81 | Resp 16 | Wt 193.4 lb

## 2020-07-06 DIAGNOSIS — L97929 Non-pressure chronic ulcer of unspecified part of left lower leg with unspecified severity: Secondary | ICD-10-CM | POA: Diagnosis not present

## 2020-07-06 DIAGNOSIS — I83029 Varicose veins of left lower extremity with ulcer of unspecified site: Secondary | ICD-10-CM

## 2020-07-06 NOTE — Progress Notes (Signed)
History of Present Illness  There is no documented history at this time  Assessments & Plan   There are no diagnoses linked to this encounter.    Additional instructions  Subjective:  Patient presents with venous ulcer of the Left lower extremity.    Procedure:  3 layer unna wrap was placed Left lower extremity.   Plan:   Follow up in one week.  

## 2020-07-13 ENCOUNTER — Ambulatory Visit (INDEPENDENT_AMBULATORY_CARE_PROVIDER_SITE_OTHER): Payer: Medicare Other | Admitting: Nurse Practitioner

## 2020-07-13 ENCOUNTER — Other Ambulatory Visit: Payer: Self-pay

## 2020-07-13 ENCOUNTER — Encounter (INDEPENDENT_AMBULATORY_CARE_PROVIDER_SITE_OTHER): Payer: Self-pay | Admitting: Nurse Practitioner

## 2020-07-13 VITALS — BP 122/69 | HR 73 | Resp 16 | Wt 196.0 lb

## 2020-07-13 DIAGNOSIS — I10 Essential (primary) hypertension: Secondary | ICD-10-CM | POA: Diagnosis not present

## 2020-07-13 DIAGNOSIS — L97929 Non-pressure chronic ulcer of unspecified part of left lower leg with unspecified severity: Secondary | ICD-10-CM

## 2020-07-13 DIAGNOSIS — I83029 Varicose veins of left lower extremity with ulcer of unspecified site: Secondary | ICD-10-CM | POA: Diagnosis not present

## 2020-07-13 MED ORDER — DOXYCYCLINE HYCLATE 100 MG PO CAPS: 100.0000 mg | ORAL_CAPSULE | Freq: Two times a day (BID) | ORAL | 0 refills | Status: DC

## 2020-07-13 MED ORDER — MUPIROCIN 2 % EX OINT: 1.0000 "application " | TOPICAL_OINTMENT | Freq: Two times a day (BID) | CUTANEOUS | 0 refills | Status: DC

## 2020-07-13 MED ORDER — TRAMADOL HCL 50 MG PO TABS: 50.0000 mg | ORAL_TABLET | Freq: Four times a day (QID) | ORAL | 0 refills | Status: DC | PRN

## 2020-07-13 NOTE — Progress Notes (Signed)
Subjective:    Patient ID: Janet [name], female    DOB: 12/12/1950, 70 y.o.   MRN: [id number] Chief Complaint  Patient presents with   Follow-up    4wk unna boot follow up    Janet [name] is a 70 year old woman that presents today for follow-up evaluation of left lower extremity ulceration.  The ulceration occurred after the patient hit her leg.  Today the wound is very shallow and much improved from when she began using her Unna wraps.  However the patient notes that she has been having severe tenderness and swelling and discomfort with the Unna wrap in place.  She notes that it has been very uncomfortable and making it difficult for her to sleep.  The patient also has neuropathy as well.  She denies any new wounds or ulcerations just extensive discomfort.   Review of Systems  Cardiovascular:  Positive for leg swelling.  Skin:  Positive for wound.  All other systems reviewed and are negative.     Objective:   Physical Exam Vitals reviewed.  HENT:     Head: Normocephalic.  Cardiovascular:     Rate and Rhythm: Normal rate.     Pulses: Normal pulses.  Pulmonary:     Effort: Pulmonary effort is normal.  Musculoskeletal:        General: Tenderness present.     Left lower leg: Edema present.  Skin:    General: Skin is warm and dry.     Findings: Wound present.  Neurological:     Mental Status: She is alert and oriented to person, place, and time.  Psychiatric:        Mood and Affect: Mood normal.        Behavior: Behavior normal.        Thought Content: Thought content normal.        Judgment: Judgment normal.    BP 122/69 (BP Location: Right Arm)   Pulse 73   Resp 16   Wt 196 lb (88.9 kg)   BMI 28.94 kg/m   Past Medical History:  Diagnosis Date   Asthma    Cancer ([place])    kidney   Hypertension     Social History   Socioeconomic History   Marital status: Married    Spouse name: Not on file   Number of children: Not on file   Years of education: Not on file    Highest education level: Not on file  Occupational History   Not on file  Tobacco Use   Smoking status: Never   Smokeless tobacco: Never  Vaping Use   Vaping Use: Never used  Substance and Sexual Activity   Alcohol use: No   Drug use: No   Sexual activity: Not on file  Other Topics Concern   Not on file  Social History Narrative   Not on file   Social Determinants of Health   Financial Resource Strain: Not on file  Food Insecurity: Not on file  Transportation Needs: Not on file  Physical Activity: Not on file  Stress: Not on file  Social Connections: Not on file  Intimate Partner Violence: Not on file    Past Surgical History:  Procedure Laterality Date   ABDOMINAL HYSTERECTOMY     COLONOSCOPY WITH PROPOFOL N/A 05/25/2019   Procedure: COLONOSCOPY WITH PROPOFOL;  Surgeon: Lin Landsman, MD;  Location: [facility] ENDOSCOPY;  Service: Gastroenterology;  Laterality: N/A;    Family History  Problem Relation Age of Onset  Colon cancer Mother    Hypertension Mother    Heart attack Father 67   Hypertension Father    Hyperlipidemia Father    Diabetes Father    Stroke Neg Hx    Breast cancer Neg Hx     Allergies  Allergen Reactions   Amlodipine Swelling    Other reaction(s): Angioedema   Shellfish Allergy Anaphylaxis   Thiazide-Type Diuretics Other (See Comments)    Other reaction(s): Other (See Comments)   Penicillins Hives and Other (See Comments)    Has patient had a PCN reaction causing immediate rash, facial/tongue/throat swelling, SOB or lightheadedness with hypotension: no Has patient had a PCN reaction causing severe rash involving mucus membranes or skin necrosis: no Has patient had a PCN reaction that required hospitalization? no Has patient had a PCN reaction occurring within the last 10 years: no If all of the above answers are "NO", then may proceed with Cephalosporin use.     CBC Latest Ref Rng & Units 05/08/2019 02/19/2018 06/23/2017  WBC 4.0 - 10.5  K/uL 7.0 6.9 7.0  Hemoglobin 12.0 - 15.0 g/dL 12.5 13.1 14.0  Hematocrit 36.0 - 46.0 % 39.1 41.6 41.5  Platelets 150 - 400 K/uL 307 335 351      CMP     Component Value Date/Time   NA 139 05/08/2019 2037   NA 141 04/12/2014 1225   K 3.9 05/08/2019 2037   K 3.4 (L) 04/12/2014 1225   CL 108 05/08/2019 2037   CL 109 04/12/2014 1225   CO2 21 (L) 05/08/2019 2037   CO2 25 04/12/2014 1225   GLUCOSE 96 05/08/2019 2037   GLUCOSE 95 04/12/2014 1225   BUN 15 05/08/2019 2037   BUN 17 04/12/2014 1225   CREATININE 0.95 05/08/2019 2037   CREATININE 0.64 04/12/2014 1225   CALCIUM 9.7 05/08/2019 2037   CALCIUM 9.2 04/12/2014 1225   PROT 8.2 (H) 02/19/2018 2220   PROT 8.2 03/11/2012 1133   ALBUMIN 4.1 02/19/2018 2220   ALBUMIN 3.6 03/11/2012 1133   AST 18 02/19/2018 2220   AST 19 03/11/2012 1133   ALT 12 02/19/2018 2220   ALT 16 03/11/2012 1133   ALKPHOS 47 02/19/2018 2220   ALKPHOS 62 03/11/2012 1133   BILITOT 0.6 02/19/2018 2220   BILITOT 0.5 03/11/2012 1133   GFRNONAA >60 05/08/2019 2037   GFRNONAA >60 04/12/2014 1225   GFRAA >60 05/08/2019 2037   GFRAA >60 04/12/2014 1225     No results found.     Assessment & Plan:   1. Varicose veins with ulcer, left ([place]) The patient is having some extensive pain with these wraps.  I suspect that the patient does have neuropathy contributing to this.  However the wound is much more superficial now and it is prudent to remove her from the wraps to see if this helps her discomfort.  I have also prescribed some mupirocin ointment for the patient to apply to the wound and cover with a bandage daily.  The patient is also advised to ensure that she wears her compression socks daily as well as elevate her lower extremity.  In addition to activity.  The patient return to the office in 4 weeks to reevaluate progression with wound healing. - traMADol (ULTRAM) 50 MG tablet; Take 1-2 tablets (50-100 mg total) by mouth every 6 (six) hours as needed.   Dispense: 30 tablet; Refill: 0  2. Essential hypertension Continue antihypertensive medications as already ordered, these medications have been reviewed and there are  no changes at this time.    Current Outpatient Medications on File Prior to Visit  Medication Sig Dispense Refill   albuterol (PROVENTIL HFA;VENTOLIN HFA) 108 (90 BASE) MCG/ACT inhaler Inhale 2 puffs into the lungs every 6 (six) hours as needed for wheezing or shortness of breath. 1 Inhaler 0   albuterol (VENTOLIN HFA) 108 (90 Base) MCG/ACT inhaler Inhale 2 puffs into the lungs every 6 (six) hours as needed for wheezing or shortness of breath. 8 g 1   aspirin EC 81 MG tablet Take 81 mg by mouth daily.     baclofen (LIORESAL) 10 MG tablet Take 10 mg by mouth 3 (three) times daily as needed.     Butalbital-APAP-Caffeine 50-300-40 MG CAPS take 1 to 2 capsules by mouth every 6 hours if needed for headache     carvedilol (COREG) 6.25 MG tablet Take 6.25 mg by mouth 2 (two) times daily.     Elastic Bandages & Supports (EMS ANTI-EMBOLISM STOCKINGS) MISC Apply in the morning and remove at night.     gabapentin (NEURONTIN) 300 MG capsule Take 300 mg by mouth at bedtime.     lisinopril (PRINIVIL,ZESTRIL) 40 MG tablet Take 1 tablet by mouth daily.  0   meloxicam (MOBIC) 15 MG tablet Take 1 tablet (15 mg total) by mouth daily. 30 tablet 2   methocarbamol (ROBAXIN) 500 MG tablet Take 1 tablet (500 mg total) by mouth 2 (two) times daily. 20 tablet 0   polyethylene glycol powder (GLYCOLAX/MIRALAX) 17 GM/SCOOP powder      potassium chloride SA (K-DUR,KLOR-CON) 20 MEQ tablet Take 20 mEq by mouth daily.      [DISCONTINUED] linaclotide (LINZESS) 145 MCG CAPS capsule Take 1 capsule (145 mcg total) by mouth daily before breakfast. 30 capsule 5   No current facility-administered medications on file prior to visit.    There are no Patient Instructions on file for this visit. No follow-ups on file.   Kris Hartmann, NP

## 2020-08-01 ENCOUNTER — Telehealth (INDEPENDENT_AMBULATORY_CARE_PROVIDER_SITE_OTHER): Payer: Self-pay | Admitting: Vascular Surgery

## 2020-08-01 NOTE — Telephone Encounter (Signed)
Called stating that she has yellow puss coming out of a new unhealing wound on her rle. Patient states that she is to be seen 08/10/20 with fb (4wks. No studies) but states that she is in pain and would like  to be seen sooner if possible. I moved appt up to 08/03/20 with fb no studies.

## 2020-08-03 ENCOUNTER — Other Ambulatory Visit (INDEPENDENT_AMBULATORY_CARE_PROVIDER_SITE_OTHER): Payer: Self-pay | Admitting: Nurse Practitioner

## 2020-08-03 ENCOUNTER — Other Ambulatory Visit: Payer: Self-pay

## 2020-08-03 ENCOUNTER — Ambulatory Visit (INDEPENDENT_AMBULATORY_CARE_PROVIDER_SITE_OTHER): Payer: Medicare Other | Admitting: Nurse Practitioner

## 2020-08-03 ENCOUNTER — Ambulatory Visit (INDEPENDENT_AMBULATORY_CARE_PROVIDER_SITE_OTHER): Payer: Medicare Other

## 2020-08-03 ENCOUNTER — Encounter (INDEPENDENT_AMBULATORY_CARE_PROVIDER_SITE_OTHER): Payer: Self-pay | Admitting: Nurse Practitioner

## 2020-08-03 VITALS — BP 136/75 | HR 79 | Resp 16 | Wt 192.0 lb

## 2020-08-03 DIAGNOSIS — M79606 Pain in leg, unspecified: Secondary | ICD-10-CM | POA: Diagnosis not present

## 2020-08-03 DIAGNOSIS — R202 Paresthesia of skin: Secondary | ICD-10-CM | POA: Diagnosis not present

## 2020-08-03 DIAGNOSIS — I83009 Varicose veins of unspecified lower extremity with ulcer of unspecified site: Secondary | ICD-10-CM | POA: Diagnosis not present

## 2020-08-03 DIAGNOSIS — L97909 Non-pressure chronic ulcer of unspecified part of unspecified lower leg with unspecified severity: Secondary | ICD-10-CM

## 2020-08-03 DIAGNOSIS — R200 Anesthesia of skin: Secondary | ICD-10-CM

## 2020-08-03 MED ORDER — DOXYCYCLINE HYCLATE 100 MG PO CAPS: 100.0000 mg | ORAL_CAPSULE | Freq: Two times a day (BID) | ORAL | 0 refills | Status: DC

## 2020-08-10 ENCOUNTER — Ambulatory Visit (INDEPENDENT_AMBULATORY_CARE_PROVIDER_SITE_OTHER): Payer: Medicare Other | Admitting: Nurse Practitioner

## 2020-08-13 ENCOUNTER — Encounter (INDEPENDENT_AMBULATORY_CARE_PROVIDER_SITE_OTHER): Payer: Self-pay | Admitting: Nurse Practitioner

## 2020-08-13 NOTE — Progress Notes (Signed)
Subjective:    Patient ID: Janet [name], female    DOB: [birth year], 70 y.o.   MRN: [id number] Chief Complaint  Patient presents with  . Follow-up    4 wk follow up    Janet [name] is a 70 year old woman that presents today for follow-up evaluation of left lower extremity ulceration.  The ulceration occurred after the patient hit her leg.  The patient was placed in [place] wraps for several weeks and the ulceration Wound now but the patient requested to be out of Unna wraps.  Today she notes that the ulceration has recurred with a new ulceration.  She notes there is bruising.  She notes that her legs have been very uncomfortable with burning and stinging and making it difficult for her to sleep.  The patient has been taking her gabapentin as prescribed.  Today noninvasive studies show an ABI of 1.08 on the right and 1.15 on the left.  This is consistent with her previous studies done in 2020.  The patient's bilateral TBI's are also normal.  The patient has triphasic tibial artery waveforms with good toe waveforms bilaterally.     Review of Systems  Cardiovascular:  Positive for leg swelling.  Skin:  Positive for wound.  All other systems reviewed and are negative.     Objective:   Physical Exam Vitals reviewed.  HENT:     Head: Normocephalic.  Cardiovascular:     Rate and Rhythm: Normal rate.     Pulses: Normal pulses.  Pulmonary:     Effort: Pulmonary effort is normal.  Musculoskeletal:        General: Swelling and tenderness present.  Skin:    Findings: Wound present.       Neurological:     Mental Status: She is alert and oriented to person, place, and time.  Psychiatric:        Mood and Affect: Mood normal.        Behavior: Behavior normal.        Thought Content: Thought content normal.        Judgment: Judgment normal.    BP 136/75 (BP Location: Right Arm)   Pulse 79   Resp 16   Wt 192 lb (87.1 kg)   BMI 28.35 kg/m   Past Medical History:  Diagnosis Date  .  Asthma   . Cancer ([place])    kidney  . Hypertension     Social History   Socioeconomic History  . Marital status: Married    Spouse name: Not on file  . Number of children: Not on file  . Years of education: Not on file  . Highest education level: Not on file  Occupational History  . Not on file  Tobacco Use  . Smoking status: Never  . Smokeless tobacco: Never  Vaping Use  . Vaping Use: Never used  Substance and Sexual Activity  . Alcohol use: No  . Drug use: No  . Sexual activity: Not on file  Other Topics Concern  . Not on file  Social History Narrative  . Not on file   Social Determinants of Health   Financial Resource Strain: Not on file  Food Insecurity: Not on file  Transportation Needs: Not on file  Physical Activity: Not on file  Stress: Not on file  Social Connections: Not on file  Intimate Partner Violence: Not on file    Past Surgical History:  Procedure Laterality Date  . ABDOMINAL HYSTERECTOMY    . COLONOSCOPY  WITH PROPOFOL N/A 05/25/2019   Procedure: COLONOSCOPY WITH PROPOFOL;  Surgeon: Lin Landsman, MD;  Location[facility] ENDOSCOPY;  Service: Gastroenterology;  Laterality: N/A;    Family History  Problem Relation Age of Onset  . Colon cancer Mother   . Hypertension Mother   . Heart attack Father 26  . Hypertension Father   . Hyperlipidemia Father   . Diabetes Father   . Stroke Neg Hx   . Breast cancer Neg Hx     Allergies  Allergen Reactions  . Amlodipine Swelling    Other reaction(s): Angioedema  . Shellfish Allergy Anaphylaxis  . Thiazide-Type Diuretics Other (See Comments)    Other reaction(s): Other (See Comments)  . Penicillins Hives and Other (See Comments)    Has patient had a PCN reaction causing immediate rash, facial/tongue/throat swelling, SOB or lightheadedness with hypotension: no Has patient had a PCN reaction causing severe rash involving mucus membranes or skin necrosis: no Has patient had a PCN reaction that  required hospitalization? no Has patient had a PCN reaction occurring within the last 10 years: no If all of the above answers are "NO", then may proceed with Cephalosporin use.     CBC Latest Ref Rng & Units 05/08/2019 02/19/2018 06/23/2017  WBC 4.0 - 10.5 K/uL 7.0 6.9 7.0  Hemoglobin 12.0 - 15.0 g/dL 12.5 13.1 14.0  Hematocrit 36.0 - 46.0 % 39.1 41.6 41.5  Platelets 150 - 400 K/uL 307 335 351      CMP     Component Value Date/Time   NA 139 05/08/2019 2037   NA 141 04/12/2014 1225   K 3.9 05/08/2019 2037   K 3.4 (L) 04/12/2014 1225   CL 108 05/08/2019 2037   CL 109 04/12/2014 1225   CO2 21 (L) 05/08/2019 2037   CO2 25 04/12/2014 1225   GLUCOSE 96 05/08/2019 2037   GLUCOSE 95 04/12/2014 1225   BUN 15 05/08/2019 2037   BUN 17 04/12/2014 1225   CREATININE 0.95 05/08/2019 2037   CREATININE 0.64 04/12/2014 1225   CALCIUM 9.7 05/08/2019 2037   CALCIUM 9.2 04/12/2014 1225   PROT 8.2 (H) 02/19/2018 2220   PROT 8.2 03/11/2012 1133   ALBUMIN 4.1 02/19/2018 2220   ALBUMIN 3.6 03/11/2012 1133   AST 18 02/19/2018 2220   AST 19 03/11/2012 1133   ALT 12 02/19/2018 2220   ALT 16 03/11/2012 1133   ALKPHOS 47 02/19/2018 2220   ALKPHOS 62 03/11/2012 1133   BILITOT 0.6 02/19/2018 2220   BILITOT 0.5 03/11/2012 1133   GFRNONAA >60 05/08/2019 2037   GFRNONAA >60 04/12/2014 1225   GFRAA >60 05/08/2019 2037   GFRAA >60 04/12/2014 1225     VAS [place] ABI WITH/WO TBI  Result Date: 08/09/2020  LOWER EXTREMITY DOPPLER STUDY Patient Name:  Janet [name]  Date of Exam:   08/03/2020 Medical Rec #: [id number]     Accession #:    [id number] Date of Birth: 03/05/1950     Patient Gender: F Patient Age:   70Y Exam Location:  [facility] Procedure:      VAS [place] ABI WITH/WO TBI Referring Phys: [id number] [place] --------------------------------------------------------------------------------  Indications: Rest pain, and ulceration.  Comparison Study: 05/02/2018 Performing Technologist: Charlane Ferretti RT (R)(VS)  Examination Guidelines: A complete evaluation includes at minimum, Doppler waveform signals and systolic blood pressure reading at the level of bilateral brachial, anterior tibial, and posterior tibial arteries, when vessel segments are accessible. Bilateral testing is considered an  integral part of a complete examination. Photoelectric Plethysmograph (PPG) waveforms and toe systolic pressure readings are included as required and additional duplex testing as needed. Limited examinations for reoccurring indications may be performed as noted.  ABI Findings: +---------+------------------+-----+---------+--------+ Right    Rt Pressure (mmHg)IndexWaveform Comment  +---------+------------------+-----+---------+--------+ Brachial 134                                      +---------+------------------+-----+---------+--------+ ATA      143               1.06 triphasic         +---------+------------------+-----+---------+--------+ PTA      146               1.08 triphasic         +---------+------------------+-----+---------+--------+ Great Toe106               0.79 Normal            +---------+------------------+-----+---------+--------+ +---------+------------------+-----+---------+-------+ Left     Lt Pressure (mmHg)IndexWaveform Comment +---------+------------------+-----+---------+-------+ Brachial 135                                     +---------+------------------+-----+---------+-------+ ATA      155               1.15 triphasic        +---------+------------------+-----+---------+-------+ PTA      146               1.08 triphasic        +---------+------------------+-----+---------+-------+ Theodoro Parma               0.84 Normal           +---------+------------------+-----+---------+-------+ +-------+-----------+-----------+------------+------------+ ABI/TBIToday's ABIToday's TBIPrevious ABIPrevious TBI  +-------+-----------+-----------+------------+------------+ Right  1.08       .79        1.17        .81          +-------+-----------+-----------+------------+------------+ Left   1.15       .84        1.24        .81          +-------+-----------+-----------+------------+------------+ Bilateral ABIs appear essentially unchanged compared to prior study on 05/02/2018. Bilateral TBIs appear essentially unchanged compared to prior study on 04/22/2018.  Summary: Right: Resting right ankle-brachial index is within normal range. No evidence of significant right lower extremity arterial disease. The right toe-brachial index is normal. Left: Resting left ankle-brachial index is within normal range. No evidence of significant left lower extremity arterial disease. The left toe-brachial index is normal. *See table(s) above for measurements and observations.  Electronically signed by Leotis Pain MD on 08/09/2020 at 5:02:55 PM.    Final        Assessment & Plan:   1. Numbness and tingling of left lower extremity The patient has complained of numbness and burning in her lower extremity for years.  Previously the patient was advised to discuss with primary care.  She was placed on gabapentin but this does not seem to be enough to help with the burning and stinging that she currently has.  We will refer the patient to neurology for further work-up and evaluation of neuropathy.  Her noninvasive studies today do show normal arterial studies. - Ambulatory referral to Neurology  2. Venous ulcer ([place]) Previously the patient has had improvement with utilizing Unna wraps however the pain in the patient's lower extremity is extensive and she refuses to have Unna wraps placed for wound.  We will refer the patient to the wound care center for possible alternative treatment options.  We will also place the patient on doxycycline to ensure there is no lingering infection. - Ambulatory referral to Wound Clinic   Current  Outpatient Medications on File Prior to Visit  Medication Sig Dispense Refill  . albuterol (PROVENTIL HFA;VENTOLIN HFA) 108 (90 BASE) MCG/ACT inhaler Inhale 2 puffs into the lungs every 6 (six) hours as needed for wheezing or shortness of breath. 1 Inhaler 0  . albuterol (VENTOLIN HFA) 108 (90 Base) MCG/ACT inhaler Inhale 2 puffs into the lungs every 6 (six) hours as needed for wheezing or shortness of breath. 8 g 1  . aspirin EC 81 MG tablet Take 81 mg by mouth daily.    . baclofen (LIORESAL) 10 MG tablet Take 10 mg by mouth 3 (three) times daily as needed.    . Butalbital-APAP-Caffeine 50-300-40 MG CAPS take 1 to 2 capsules by mouth every 6 hours if needed for headache    . carvedilol (COREG) 6.25 MG tablet Take 6.25 mg by mouth 2 (two) times daily.    Regino Schultze Bandages & Supports (EMS ANTI-EMBOLISM STOCKINGS) MISC Apply in the morning and remove at night.    . gabapentin (NEURONTIN) 300 MG capsule Take 300 mg by mouth at bedtime.    Marland Kitchen lisinopril (PRINIVIL,ZESTRIL) 40 MG tablet Take 1 tablet by mouth daily.  0  . meloxicam (MOBIC) 15 MG tablet Take 1 tablet (15 mg total) by mouth daily. 30 tablet 2  . methocarbamol (ROBAXIN) 500 MG tablet Take 1 tablet (500 mg total) by mouth 2 (two) times daily. 20 tablet 0  . mupirocin ointment (BACTROBAN) 2 % Apply 1 application topically 2 (two) times daily. 22 g 0  . polyethylene glycol powder (GLYCOLAX/MIRALAX) 17 GM/SCOOP powder     . potassium chloride SA (K-DUR,KLOR-CON) 20 MEQ tablet Take 20 mEq by mouth daily.     . traMADol (ULTRAM) 50 MG tablet Take 1-2 tablets (50-100 mg total) by mouth every 6 (six) hours as needed. 30 tablet 0  . [DISCONTINUED] linaclotide (LINZESS) 145 MCG CAPS capsule Take 1 capsule (145 mcg total) by mouth daily before breakfast. 30 capsule 5   No current facility-administered medications on file prior to visit.    There are no Patient Instructions on file for this visit. No follow-ups on file.   Kris Hartmann,  NP

## 2020-08-29 ENCOUNTER — Encounter: Payer: Medicare Other | Attending: Physician Assistant | Admitting: Physician Assistant

## 2020-08-29 ENCOUNTER — Other Ambulatory Visit: Payer: Self-pay

## 2020-08-29 DIAGNOSIS — I10 Essential (primary) hypertension: Secondary | ICD-10-CM | POA: Diagnosis not present

## 2020-08-29 DIAGNOSIS — Z880 Allergy status to penicillin: Secondary | ICD-10-CM | POA: Diagnosis not present

## 2020-08-29 DIAGNOSIS — I890 Lymphedema, not elsewhere classified: Secondary | ICD-10-CM | POA: Insufficient documentation

## 2020-08-29 DIAGNOSIS — Z86718 Personal history of other venous thrombosis and embolism: Secondary | ICD-10-CM | POA: Insufficient documentation

## 2020-08-29 DIAGNOSIS — L97822 Non-pressure chronic ulcer of other part of left lower leg with fat layer exposed: Secondary | ICD-10-CM | POA: Diagnosis present

## 2020-08-29 DIAGNOSIS — I872 Venous insufficiency (chronic) (peripheral): Secondary | ICD-10-CM | POA: Diagnosis not present

## 2020-08-29 DIAGNOSIS — R2241 Localized swelling, mass and lump, right lower limb: Secondary | ICD-10-CM | POA: Insufficient documentation

## 2020-08-29 DIAGNOSIS — M7989 Other specified soft tissue disorders: Secondary | ICD-10-CM | POA: Diagnosis not present

## 2020-08-29 NOTE — Progress Notes (Signed)
Janet, [name] ([id number]) Visit Report for 08/29/2020 Allergy List Details Patient Name: Janet [name], Janet [name]. Date of Service: 08/29/2020 9:45 AM Medical Record Number: [id number] Patient Account Number: [id number] Date of Birth/Sex: 1950/06/10 (70 y.o. F) Treating RN: Janet [name] Primary Care Janet [name]: Janet [name] Other Clinician: Referring Janet [name]: Janet [name] Treating Janet [name]/Extender: Janet [name] Weeks in Treatment: 0 Allergies Active Allergies penicillin Reaction: hives Severity: Moderate Allergy Notes Electronic Signature(s) Signed: 08/29/2020 11:50:15 AM By: Janet [name] Entered By: Janet [name] on 08/29/2020 10:05:14 [name], [name] ([id number]) -------------------------------------------------------------------------------- Arrival Information Details Patient Name: Janet [name]. Date of Service: 08/29/2020 9:45 AM Medical Record Number: [id number] Patient Account Number: [id number] Date of Birth/Sex: 30-Mar-1950 (70 y.o. F) Treating RN: Janet [name] Primary Care Eydan Chianese: Janet [name] Other Clinician: Referring Rydge Texidor: Janet [name] Treating Mable Lashley/Extender: Janet [name] in Treatment: 0 Visit Information Patient Arrived: Ambulatory Arrival Time: 09:57 Accompanied By: self Transfer Assistance: None Patient Identification Verified: Yes Secondary Verification Process Completed: Yes Patient Has Alerts: Yes Patient Alerts: Patient on Blood Thinner Aspirin '81mg'$  AVVS ABI/TBI 7/22 ABI L- 1.15/ R- 1.08 Electronic Signature(s) Signed: 08/29/2020 11:47:33 AM By: Janet [name] Entered By: Janet [name] on 08/29/2020 11:47:32 [name], [name] ([id number]) -------------------------------------------------------------------------------- Clinic Level of Care Assessment Details Patient Name: Janet [name]. Date of Service: 08/29/2020 9:45 AM Medical Record Number: [id number] Patient Account Number: [id number] Date of Birth/Sex: March 25, 1950 (70 y.o. F) Treating RN: Janet [name] Primary  Care Ellsworth Waldschmidt: Janet [name] Other Clinician: Referring Abisai Deer: Janet [name] Treating Nashay Brickley/Extender: Janet [name] in Treatment: 0 Clinic Level of Care Assessment Items TOOL 1 Quantity Score '[]'$  - Use when EandM and Procedure is performed on INITIAL visit 0 ASSESSMENTS - Nursing Assessment / Reassessment X - General Physical Exam (combine w/ comprehensive assessment (listed just below) when performed on new 1 20 pt. evals) X- 1 25 Comprehensive Assessment (HX, ROS, Risk Assessments, Wounds Hx, etc.) ASSESSMENTS - Wound and Skin Assessment / Reassessment '[]'$  - Dermatologic / Skin Assessment (not related to wound area) 0 ASSESSMENTS - Ostomy and/or Continence Assessment and Care '[]'$  - Incontinence Assessment and Management 0 '[]'$  - 0 Ostomy Care Assessment and Management (repouching, etc.) PROCESS - Coordination of Care X - Simple Patient / Family Education for ongoing care 1 15 '[]'$  - 0 Complex (extensive) Patient / Family Education for ongoing care X- 1 10 Staff obtains Programmer, systems, Records, Test Results / Process Orders '[]'$  - 0 Staff telephones HHA, Nursing Homes / Clarify orders / etc '[]'$  - 0 Routine Transfer to another Facility (non-emergent condition) '[]'$  - 0 Routine Hospital Admission (non-emergent condition) X- 1 15 New Admissions / Biomedical engineer / Ordering NPWT, Apligraf, etc. '[]'$  - 0 Emergency Hospital Admission (emergent condition) PROCESS - Special Needs '[]'$  - Pediatric / Minor Patient Management 0 '[]'$  - 0 Isolation Patient Management '[]'$  - 0 Hearing / Language / Visual special needs '[]'$  - 0 Assessment of Community assistance (transportation, D/C planning, etc.) '[]'$  - 0 Additional assistance / Altered mentation '[]'$  - 0 Support Surface(s) Assessment (bed, cushion, seat, etc.) INTERVENTIONS - Miscellaneous '[]'$  - External ear exam 0 '[]'$  - 0 Patient Transfer (multiple staff / Civil Service fast streamer / Similar devices) '[]'$  - 0 Simple Staple / Suture removal (25 or less) '[]'$   - 0 Complex Staple / Suture removal (26 or more) '[]'$  - 0 Hypo/Hyperglycemic Management (do not check if billed separately) '[]'$  - 0 Ankle / Brachial Index (ABI) - do not check if billed separately Has the patient been seen at the hospital within the  last three years: Yes Total Score: 85 Level Of Care: New/Established - Level 3 [name], [name] ([id number]) Electronic Signature(s) Signed: 08/29/2020 11:58:42 AM By: Janet [name] Previous Signature: 08/29/2020 11:50:15 AM Version By: Janet [name] Entered By: Janet [name] on 08/29/2020 11:55:15 [name], [name] ([id number]) -------------------------------------------------------------------------------- Compression Therapy Details Patient Name: Janet [name]. Date of Service: 08/29/2020 9:45 AM Medical Record Number: [id number] Patient Account Number: [id number] Date of Birth/Sex: 1950-05-06 (70 y.o. F) Treating RN: Janet [name] Primary Care Janet [name]: Janet [name] Other Clinician: Referring Janet [name]: Janet [name] Treating Janet [name]/Extender: Janet [name] in Treatment: 0 Compression Therapy Performed for Wound Assessment: Wound #1 Left,Distal Lower Leg Performed By: Clinician Janet Poag, RN Compression Type: Three Layer Post Procedure Diagnosis Same as Pre-procedure Electronic Signature(s) Signed: 08/29/2020 11:50:15 AM By: Janet [name] Entered By: Janet [name] on 08/29/2020 10:43:45 [name], [name] ([id number]) -------------------------------------------------------------------------------- Encounter Discharge Information Details Patient Name: Janet [name]. Date of Service: 08/29/2020 9:45 AM Medical Record Number: [id number] Patient Account Number: [id number] Date of Birth/Sex: [birth year] (70 y.o. F) Treating RN: Janet [name] Primary Care Janet [name]: Janet [name] Other Clinician: Referring Janet [name]: Janet [name] Treating Janet [name]/Extender: Janet [name] in Treatment: 0 Encounter Discharge Information Items Post Procedure Vitals Discharge  Condition: Stable Temperature (F): 98.8 Ambulatory Status: Ambulatory Pulse (bpm): 82 Discharge Destination: Home Respiratory Rate (breaths/min): 16 Transportation: Private Auto Blood Pressure (mmHg): 124/74 Accompanied By: self Schedule Follow-up Appointment: Yes Clinical Summary of Care: Electronic Signature(s) Signed: 08/29/2020 11:50:15 AM By: Janet [name] Entered By: Janet [name] on 08/29/2020 11:03:24 Farve, Taisa M. ([id number]) -------------------------------------------------------------------------------- Lower Extremity Assessment Details Patient Name: Janet [name]. Date of Service: 08/29/2020 9:45 AM Medical Record Number: [id number] Patient Account Number: [id number] Date of Birth/Sex: 08/31/1950 (70 y.o. F) Treating RN: Janet [name] Primary Care Di Jasmer: Janet [name] Other Clinician: Referring Barnet Benavides: Janet [name] Treating Mardell Suttles/Extender: Janet [name] Weeks in Treatment: 0 Edema Assessment Assessed: [Left: Yes] [Right: Yes] Edema: [Left: Yes] [Right: Yes] Calf Left: Right: Point of Measurement: 32 cm From Medial Instep 42 cm 41 cm Ankle Left: Right: Point of Measurement: 10 cm From Medial Instep 24.5 cm 24.5 cm Knee To Floor Left: Right: From Medial Instep 41 cm 41 cm Vascular Assessment Pulses: Dorsalis Pedis Doppler Audible: [Left:Yes] Electronic Signature(s) Signed: 08/29/2020 11:50:15 AM By: Janet [name] Entered By: Janet [name] on 08/29/2020 10:17:55 Mavity, Giselle M. ([id number]) -------------------------------------------------------------------------------- Multi Wound Chart Details Patient Name: Janet [name]. Date of Service: 08/29/2020 9:45 AM Medical Record Number: [id number] Patient Account Number: [id number] Date of Birth/Sex: [name] 02, 1951 (70 y.o. F) Treating RN: Janet [name] Primary Care Maxyne Derocher: Janet [name] Other Clinician: Referring Ziyon Soltau: Janet [name] Treating Froylan Hobby/Extender: Janet [name] in Treatment: 0 Vital  Signs Height(in): 20 Pulse(bpm): 36 Weight(lbs): 192 Blood Pressure(mmHg): 124/74 Body Mass Index(BMI): 31 Temperature(F): 98.8 Respiratory Rate(breaths/min): 16 Photos: [N/A:N/A] Wound Location: Left, Distal Lower Leg Left, Proximal Lower Leg N/A Wounding Event: Blister Blister N/A Primary Etiology: Venous Leg Ulcer Venous Leg Ulcer N/A Comorbid History: Asthma, Hypertension Asthma, Hypertension N/A Date Acquired: 05/15/2020 05/15/2020 N/A Weeks of Treatment: 0 0 N/A Wound Status: Open Open N/A Measurements L x W x D (cm) 3.8x2x0.1 1x1.5x0.1 N/A Area (cm) : 5.969 1.178 N/A Volume (cm) : 0.597 0.118 N/A Classification: Full Thickness Without Exposed Full Thickness Without Exposed N/A Support Structures Support Structures Exudate Amount: Medium Medium N/A Exudate Type: Serosanguineous Serosanguineous N/A Exudate Color: red, brown red, brown N/A Granulation Amount: Medium (34-66%) Medium (34-66%) N/A Granulation Quality: Red Red N/A Necrotic Amount: Medium (34-66%) Medium (34-66%) N/A Exposed  Structures: Fat Layer (Subcutaneous Tissue): Fat Layer (Subcutaneous Tissue): N/A Yes Yes Fascia: No Fascia: No Tendon: No Tendon: No Muscle: No Muscle: No Joint: No Joint: No Bone: No Bone: No Treatment Notes Electronic Signature(s) Signed: 08/29/2020 11:50:15 AM By: Janet [name] Entered By: Janet [name] on 08/29/2020 10:26:15 Cheuvront, Cathryne M. ([id number]) -------------------------------------------------------------------------------- Cassia Details Patient Name: Janet [name]. Date of Service: 08/29/2020 9:45 AM Medical Record Number: [id number] Patient Account Number: [id number] Date of Birth/Sex: Aug 27, 1950 (70 y.o. F) Treating RN: Janet [name] Primary Care Illona Bulman: Janet [name] Other Clinician: Referring Yanis Larin: Janet [name] Treating Chinita Schimpf/Extender: Janet [name] in Treatment: 0 Active Inactive Orientation to the Wound Care Program Nursing  Diagnoses: Knowledge deficit related to the wound healing center program Goals: Patient/caregiver will verbalize understanding of the [facility] Date Initiated: 08/29/2020 Target Resolution Date: 09/14/2020 Goal Status: Active Interventions: Provide education on orientation to the wound center Notes: Wound/Skin Impairment Nursing Diagnoses: Impaired tissue integrity Knowledge deficit related to smoking impact on wound healing Knowledge deficit related to ulceration/compromised skin integrity Goals: Patient/caregiver will verbalize understanding of skin care regimen Date Initiated: 08/29/2020 Target Resolution Date: 09/14/2020 Goal Status: Active Ulcer/skin breakdown will have a volume reduction of 30% by week 4 Date Initiated: 08/29/2020 Target Resolution Date: 09/29/2020 Goal Status: Active Ulcer/skin breakdown will have a volume reduction of 50% by week 8 Date Initiated: 08/29/2020 Target Resolution Date: 10/29/2020 Goal Status: Active Ulcer/skin breakdown will have a volume reduction of 80% by week 12 Date Initiated: 08/29/2020 Target Resolution Date: 11/29/2020 Goal Status: Active Ulcer/skin breakdown will heal within 14 weeks Date Initiated: 08/29/2020 Target Resolution Date: 12/14/2020 Goal Status: Active Interventions: Assess patient/caregiver ability to obtain necessary supplies Assess patient/caregiver ability to perform ulcer/skin care regimen upon admission and as needed Assess ulceration(s) every visit Provide education on ulcer and skin care Notes: Electronic Signature(s) Signed: 08/29/2020 11:50:15 AM By: Janet [name] Entered By: Janet [name] on 08/29/2020 10:26:03 Acree, Cerina M. ([id number]) -------------------------------------------------------------------------------- Pain Assessment Details Patient Name: Janet [name]. Date of Service: 08/29/2020 9:45 AM Medical Record Number: [id number] Patient Account Number: [id number] Date of Birth/Sex:  February 01, 1950 (70 y.o. F) Treating RN: Janet [name] Primary Care Ronin Crager: Janet [name] Other Clinician: Referring Lynden Flemmer: Janet [name] Treating Symone Cornman/Extender: Janet [name] in Treatment: 0 Active Problems Location of Pain Severity and Description of Pain Patient Has Paino Yes Site Locations Pain Location: Pain in Ulcers Rate the pain. Current Pain Level: 9 Pain Management and Medication Current Pain Management: Electronic Signature(s) Signed: 08/29/2020 11:50:15 AM By: Janet [name] Entered By: Janet [name] on 08/29/2020 10:00:34 Shearman, Mackenzee M. ([id number]) -------------------------------------------------------------------------------- Patient/Caregiver Education Details Patient Name: Janet [name]. Date of Service: 08/29/2020 9:45 AM Medical Record Number: [id number] Patient Account Number: [id number] Date of Birth/Gender: 10/27/1950 (70 y.o. F) Treating RN: Janet [name] Primary Care Physician: Janet [name] Other Clinician: Referring Physician: Eulogio [name] Treating Physician/Extender: Janet [name] in Treatment: 0 Education Assessment Education Provided To: Patient Education Topics Provided Basic Hygiene: Nutrition: Pain: Venous: [facility] [place]: Wound/Skin Impairment: Electronic Signature(s) Signed: 08/29/2020 11:50:15 AM By: Janet [name] Entered By: Janet [name] on 08/29/2020 10:45:03 Kilroy, Leilynn M. ([id number]) -------------------------------------------------------------------------------- Wound Assessment Details Patient Name: Janet [name]. Date of Service: 08/29/2020 9:45 AM Medical Record Number: [id number] Patient Account Number: [id number] Date of Birth/Sex: 1950/11/16 (70 y.o. F) Treating RN: Janet [name] Primary Care Areesha Dehaven: Janet [name] Other Clinician: Referring Adysson Revelle: Janet [name] Treating Jazmene Racz/Extender: Janet [name] Weeks in Treatment: 0 Wound Status Wound Number: 1 Primary  Etiology: Venous Leg Ulcer Wound Location:  Left, Distal Lower Leg Wound Status: Open Wounding Event: Blister Comorbid History: Asthma, Hypertension Date Acquired: 05/15/2020 Weeks Of Treatment: 0 Clustered Wound: No Photos Wound Measurements Length: (cm) 3.8 Width: (cm) 2 Depth: (cm) 0.1 Area: (cm) 5.969 Volume: (cm) 0.597 % Reduction in Area: % Reduction in Volume: Tunneling: No Undermining: No Wound Description Classification: Full Thickness Without Exposed Support Structu Exudate Amount: Medium Exudate Type: Serosanguineous Exudate Color: red, brown res Foul Odor After Cleansing: No Slough/Fibrino Yes Wound Bed Granulation Amount: Medium (34-66%) Exposed Structure Granulation Quality: Red Fascia Exposed: No Necrotic Amount: Medium (34-66%) Fat Layer (Subcutaneous Tissue) Exposed: Yes Necrotic Quality: Adherent Slough Tendon Exposed: No Muscle Exposed: No Joint Exposed: No Bone Exposed: No Electronic Signature(s) Signed: 08/29/2020 11:50:15 AM By: Janet [name] Entered By: Janet [name] on 08/29/2020 10:20:35 Hackworth, Koa M. ([id number]) -------------------------------------------------------------------------------- Wound Assessment Details Patient Name: Janet [name]. Date of Service: 08/29/2020 9:45 AM Medical Record Number: [id number] Patient Account Number: [id number] Date of Birth/Sex: [birth year] (70 y.o. F) Treating RN: Janet [name] Primary Care Noel Rodier: Janet [name] Other Clinician: Referring Laquitta Dominski: Janet [name] Treating Keyairra Kolinski/Extender: Janet [name] Weeks in Treatment: 0 Wound Status Wound Number: 2 Primary Etiology: Venous Leg Ulcer Wound Location: Left, Proximal Lower Leg Wound Status: Open Wounding Event: Blister Comorbid History: Asthma, Hypertension Date Acquired: 05/15/2020 Weeks Of Treatment: 0 Clustered Wound: No Photos Wound Measurements Length: (cm) 1 Width: (cm) 1.5 Depth: (cm) 0.1 Area: (cm) 1.178 Volume: (cm) 0.118 % Reduction in Area: % Reduction in Volume: Tunneling:  No Undermining: No Wound Description Classification: Full Thickness Without Exposed Support Structu Exudate Amount: Medium Exudate Type: Serosanguineous Exudate Color: red, brown res Foul Odor After Cleansing: No Slough/Fibrino Yes Wound Bed Granulation Amount: Medium (34-66%) Exposed Structure Granulation Quality: Red Fascia Exposed: No Necrotic Amount: Medium (34-66%) Fat Layer (Subcutaneous Tissue) Exposed: Yes Necrotic Quality: Adherent Slough Tendon Exposed: No Muscle Exposed: No Joint Exposed: No Bone Exposed: No Electronic Signature(s) Signed: 08/29/2020 11:50:15 AM By: Janet [name] Entered By: Janet [name] on 08/29/2020 10:22:10 Coulson, Niomie M. ([id number]) -------------------------------------------------------------------------------- Vitals Details Patient Name: Janet [name]. Date of Service: 08/29/2020 9:45 AM Medical Record Number: [id number] Patient Account Number: [id number] Date of Birth/Sex: 05/18/1950 (70 y.o. F) Treating RN: Janet [name] Primary Care Nthony Lefferts: Janet [name] Other Clinician: Referring Jisella Ashenfelter: Janet [name] Treating Bailee Thall/Extender: Janet [name] in Treatment: 0 Vital Signs Time Taken: 10:00 Temperature (F): 98.8 Height (in): 66 Pulse (bpm): 82 Source: Stated Respiratory Rate (breaths/min): 16 Weight (lbs): 192 Blood Pressure (mmHg): 124/74 Source: Measured Reference Range: 80 - 120 mg / dl Body Mass Index (BMI): 31 Electronic Signature(s) Signed: 08/29/2020 11:50:15 AM By: Janet [name] Entered ByDonnamarie [name] on 08/29/2020 10:04:22

## 2020-08-29 NOTE — Progress Notes (Signed)
Janet [name], Janet [name] ([id number]) Visit Report for 08/29/2020 Chief Complaint Document Details Patient Name: Janet [name], Janet [name]. Date of Service: 08/29/2020 9:45 AM Medical Record Number: [id number] Patient Account Number: [id number] Date of Birth/Sex: Dec 12, 1950 (70 y.o. F) Treating RN: Donnamarie Poag Primary Care Provider: Delight Stare Other Clinician: Referring Provider: Eulogio Ditch Treating Provider/Extender: Skipper Cliche in Treatment: 0 Information Obtained from: Patient Chief Complaint Left LE Ulcers Electronic Signature(s) Signed: 08/29/2020 10:28:48 AM By: Worthy Keeler PA-C Entered By: Worthy Keeler on 08/29/2020 10:28:47 Janet [name], [name] ([id number]) -------------------------------------------------------------------------------- Debridement Details Patient Name: Janet [name]. Date of Service: 08/29/2020 9:45 AM Medical Record Number: [id number] Patient Account Number: [id number] Date of Birth/Sex: 1950-05-07 (70 y.o. F) Treating RN: Donnamarie Poag Primary Care Provider: Delight Stare Other Clinician: Referring Provider: Eulogio Ditch Treating Provider/Extender: Skipper Cliche in Treatment: 0 Debridement Performed for Wound #1 Left,Distal Lower Leg Assessment: Performed By: Physician Tommie Sams., PA-C Debridement Type: Debridement Severity of Tissue Pre Debridement: Fat layer exposed Level of Consciousness (Pre- Awake and Alert procedure): Pre-procedure Verification/Time Out Yes - 10:33 Taken: Start Time: 10:33 Pain Control: Lidocaine Total Area Debrided (L x W): 3.8 (cm) x 2 (cm) = 7.6 (cm) Tissue and other material Slough, Subcutaneous, Biofilm, Slough debrided: Level: Skin/Subcutaneous Tissue Debridement Description: Excisional Instrument: Curette Bleeding: Minimum Hemostasis Achieved: Pressure End Time: 10:35 Response to Treatment: Procedure was tolerated well Level of Consciousness (Post- Awake and Alert procedure): Post Debridement Measurements of Total  Wound Length: (cm) 3.8 Width: (cm) 2 Depth: (cm) 0.1 Volume: (cm) 0.597 Character of Wound/Ulcer Post Debridement: Improved Severity of Tissue Post Debridement: Fat layer exposed Post Procedure Diagnosis Same as Pre-procedure Electronic Signature(s) Signed: 08/29/2020 11:50:15 AM By: Donnamarie Poag Signed: 08/29/2020 4:23:01 PM By: Worthy Keeler PA-C Entered By: Donnamarie Poag on 08/29/2020 10:35:58 Janet [name], [name] ([id number]) -------------------------------------------------------------------------------- Debridement Details Patient Name: Janet [name]. Date of Service: 08/29/2020 9:45 AM Medical Record Number: [id number] Patient Account Number: [id number] Date of Birth/Sex: April 24, 1950 (70 y.o. F) Treating RN: Donnamarie Poag Primary Care Provider: Delight Stare Other Clinician: Referring Provider: Eulogio Ditch Treating Provider/Extender: Skipper Cliche in Treatment: 0 Debridement Performed for Wound #2 Left,Proximal Lower Leg Assessment: Performed By: Physician Tommie Sams., PA-C Debridement Type: Debridement Severity of Tissue Pre Debridement: Fat layer exposed Level of Consciousness (Pre- Awake and Alert procedure): Pre-procedure Verification/Time Out Yes - 10:33 Taken: Start Time: 10:33 Pain Control: Lidocaine Total Area Debrided (L x W): 1 (cm) x 1.5 (cm) = 1.5 (cm) Tissue and other material Slough, Subcutaneous, Biofilm, Slough debrided: Level: Skin/Subcutaneous Tissue Debridement Description: Excisional Instrument: Curette Bleeding: Minimum Hemostasis Achieved: Pressure End Time: 10:37 Response to Treatment: Procedure was tolerated well Level of Consciousness (Post- Awake and Alert procedure): Post Debridement Measurements of Total Wound Length: (cm) 1 Width: (cm) 1.5 Depth: (cm) 0.1 Volume: (cm) 0.118 Character of Wound/Ulcer Post Debridement: Improved Severity of Tissue Post Debridement: Fat layer exposed Post Procedure Diagnosis Same as  Pre-procedure Electronic Signature(s) Signed: 08/29/2020 11:50:15 AM By: Donnamarie Poag Signed: 08/29/2020 4:23:01 PM By: Worthy Keeler PA-C Entered By: Donnamarie Poag on 08/29/2020 10:36:43 Janet [name], [name] ([id number]) -------------------------------------------------------------------------------- HPI Details Patient Name: Janet [name]. Date of Service: 08/29/2020 9:45 AM Medical Record Number: [id number] Patient Account Number: [id number] Date of Birth/Sex: [birth year] (70 y.o. F) Treating RN: Donnamarie Poag Primary Care Provider: Delight Stare Other Clinician: Referring Provider: Eulogio Ditch Treating Provider/Extender: Skipper Cliche in Treatment: 0 History of Present Illness HPI Description: 08/29/2020 upon evaluation today patient  appears to be doing somewhat poorly in regard to what appeared to be venous leg ulcers on the left leg that she has been dealing with since around the beginning of May. Fortunately she does not have any signs of obvious infection unfortunately she does have quite a bit of necrotic tissue that does not really need to be debrided. She has been seeing vascular and they did wrap her for a time with what was likely an Haematologist. With that being said the [facility] boot has given her some trouble as far as feeling claustrophobic was concerned she is not sure if she is good to be able to tolerate a compression wrap currently although we will get a try she is in agreement with Aleve given this shot. Fortunately I do not see any signs of active infection systemically which is great news and even locally she seems to be doing quite well today. Patient's Right ABI is 1.08 with a TBI of 0.79 and the left ABI is 1.15 with a TBI of 0.84 on 08/03/20. She also has a history of chronic venous insufficiency and lymphedema but otherwise no major medical problems. Electronic Signature(s) Signed: 08/29/2020 3:06:38 PM By: Worthy Keeler PA-C Entered By: Worthy Keeler on 08/29/2020  15:06:38 Janet [name], [name] ([id number]) -------------------------------------------------------------------------------- Physical Exam Details Patient Name: Janet [name]. Date of Service: 08/29/2020 9:45 AM Medical Record Number: [id number] Patient Account Number: [id number] Date of Birth/Sex: 05/11/1950 (70 y.o. F) Treating RN: Donnamarie Poag Primary Care Provider: Delight Stare Other Clinician: Referring Provider: Eulogio Ditch Treating Provider/Extender: Skipper Cliche in Treatment: 0 Constitutional supine blood pressure is within target range for patient.Marland Kitchen respirations regular, non-labored and within target range for patient.Marland Kitchen temperature within target range for patient.. Well-nourished and well-hydrated in no acute distress. Eyes conjunctiva clear no eyelid edema noted. pupils equal round and reactive to light and accommodation. Ears, Nose, Mouth, and Throat no gross abnormality of ear auricles or external auditory canals. normal hearing noted during conversation. mucus membranes moist. Respiratory normal breathing without difficulty. Cardiovascular 2+ dorsalis pedis/posterior tibialis pulses. 1+ pitting edema of the bilateral lower extremities. Musculoskeletal normal gait and posture. no significant deformity or arthritic changes, no loss or range of motion, no clubbing. Psychiatric this patient is able to make decisions and demonstrates good insight into disease process. Alert and Oriented x 3. pleasant and cooperative. Notes Upon inspection patient's wound bed actually showed signs of good granulation and minimal areas mainly there was a lot of slough and necrotic debris that did need to be cleared away. Subsequently I did perform sharp debridement to clear away the necrotic tissue today she tolerated this with some discomfort again I was trying to be as careful as possible. I did not clear away everything but nonetheless was able to clear away a majority of the necrotic tissue. I do  believe that utilizing Hydrofera Blue could be appropriate at this point underneath the compression wrap. Electronic Signature(s) Signed: 08/29/2020 3:07:12 PM By: Worthy Keeler PA-C Entered By: Worthy Keeler on 08/29/2020 15:07:11 Janet [name], [name] ([id number]) -------------------------------------------------------------------------------- Physician Orders Details Patient Name: Janet [name]. Date of Service: 08/29/2020 9:45 AM Medical Record Number: [id number] Patient Account Number: [id number] Date of Birth/Sex: 1950/02/28 (70 y.o. F) Treating RN: Donnamarie Poag Primary Care Provider: Delight Stare Other Clinician: Referring Provider: Eulogio Ditch Treating Provider/Extender: Skipper Cliche in Treatment: 0 Verbal / Phone Orders: No Diagnosis Coding ICD-10 Coding Code Description I87.2 Venous insufficiency (chronic) (peripheral) I89.0 Lymphedema, not elsewhere  classified 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 of other part of left lower leg with fat layer exposed Follow-up Appointments o Return Appointment in 1 week. - weekly wraps o Nurse Visit as needed - call to schedule if needed Bathing/ Shower/ Hygiene o May shower with wound dressing protected with water repellent cover or cast protector. o No tub bath. Edema Control - Lymphedema / Segmental Compressive Device / Other o Optional: One layer of unna paste to top of compression wrap (to act as an anchor). - left leg o Patient to wear own compression stockings. Remove compression stockings every night before going to bed and put on every morning when getting up. - right leg-see Elastic Therapy contact and measurements o Elevate legs to the level of the heart and pump ankles as often as possible o Elevate leg(s) parallel to the floor when sitting. o DO YOUR BEST to sleep in the bed at night. DO NOT sleep in your recliner. Long hours of sitting in a recliner leads to swelling of the legs and/or potential wounds on  your backside. Medications-Please add to medication list. o Take one '500mg'$  Tylenol (Acetaminophen) and one '200mg'$  Motrin (Ibuprofen) every 6 hours for pain. Do not take ibuprofen if you are on blood thinners or have stomach ulcers. - as needed for pain if not contraindicated by other physician Wound Treatment Wound #1 - Lower Leg Wound Laterality: Left, Distal Cleanser: Soap and Water 1 x Per Week/30 Days Discharge Instructions: Gently cleanse wound with antibacterial soap, rinse and pat dry prior to dressing wounds Primary Dressing: Hydrofera Blue Ready Transfer Foam, 4x5 (in/in) 1 x Per Week/30 Days Discharge Instructions: Apply Hydrofera Blue Ready to wound bed as directed Secondary Dressing: ABD Pad 5x9 (in/in) 1 x Per Week/30 Days Discharge Instructions: Cover with ABD pad Compression Wrap: Profore Lite LF 3 Multilayer Compression Bandaging System 1 x Per Week/30 Days Discharge Instructions: Apply 3 multi-layer wrap as prescribed. Wound #2 - Lower Leg Wound Laterality: Left, Proximal Cleanser: Soap and Water 1 x Per Week/30 Days Discharge Instructions: Gently cleanse wound with antibacterial soap, rinse and pat dry prior to dressing wounds Primary Dressing: Hydrofera Blue Ready Transfer Foam, 4x5 (in/in) 1 x Per Week/30 Days Discharge Instructions: Apply Hydrofera Blue Ready to wound bed as directed Secondary Dressing: ABD Pad 5x9 (in/in) 1 x Per Week/30 Days Discharge Instructions: Cover with ABD pad Compression Wrap: Profore Lite LF 3 Multilayer Compression Bandaging System 1 x Per Week/30 Days Discharge Instructions: Apply 3 multi-layer wrap as prescribed. Janet [name], Janet [name] ([id number]) Electronic Signature(s) Signed: 08/29/2020 11:50:15 AM By: Donnamarie Poag Signed: 08/29/2020 4:23:01 PM By: Worthy Keeler PA-C Entered By: Donnamarie Poag on 08/29/2020 10:41:55 Janet [name], [name] ([id number]) -------------------------------------------------------------------------------- Problem List  Details Patient Name: Janet [name]. Date of Service: 08/29/2020 9:45 AM Medical Record Number: [id number] Patient Account Number: [id number] Date of Birth/Sex: 09-25-1950 (70 y.o. F) Treating RN: Donnamarie Poag Primary Care Provider: Delight Stare Other Clinician: Referring Provider: Eulogio Ditch Treating Provider/Extender: Skipper Cliche in Treatment: 0 Active Problems ICD-10 Encounter Code Description Active Date MDM Diagnosis I87.2 Venous insufficiency (chronic) (peripheral) 08/29/2020 No Yes I89.0 Lymphedema, not elsewhere classified 08/29/2020 No Yes 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 of other part of left lower leg with fat layer 08/29/2020 No Yes exposed Inactive Problems Resolved Problems Electronic Signature(s) Signed: 08/29/2020 10:28:36 AM By: Worthy Keeler PA-C Entered By: Worthy Keeler on 08/29/2020 10:28:35 Rumbold, Daylee M. ([id number]) -------------------------------------------------------------------------------- Progress Note Details Patient Name: Janet [name]. Date of Service: 08/29/2020  9:45 AM Medical Record Number: [id number] Patient Account Number: [id number] Date of Birth/Sex: 05-Dec-1950 (70 y.o. F) Treating RN: Donnamarie Poag Primary Care Provider: Delight Stare Other Clinician: Referring Provider: Eulogio Ditch Treating Provider/Extender: Skipper Cliche in Treatment: 0 Subjective Chief Complaint Information obtained from Patient Left LE Ulcers History of Present Illness (HPI) 08/29/2020 upon evaluation today patient appears to be doing somewhat poorly in regard to what appeared to be venous leg ulcers on the left leg that she has been dealing with since around the beginning of May. Fortunately she does not have any signs of obvious infection unfortunately she does have quite a bit of necrotic tissue that does not really need to be debrided. She has been seeing vascular and they did wrap her for a time with what was likely an Haematologist. With that being said  the [facility] boot has given her some trouble as far as feeling claustrophobic was concerned she is not sure if she is good to be able to tolerate a compression wrap currently although we will get a try she is in agreement with Aleve given this shot. Fortunately I do not see any signs of active infection systemically which is great news and even locally she seems to be doing quite well today. Patient's Right ABI is 1.08 with a TBI of 0.79 and the left ABI is 1.15 with a TBI of 0.84 on 08/03/20. She also has a history of chronic venous insufficiency and lymphedema but otherwise no major medical problems. Patient History Information obtained from Patient. Allergies penicillin (Severity: Moderate, Reaction: hives) Social History Never smoker, Marital Status - Married, Alcohol Use - Never, Drug Use - No History, Caffeine Use - Never. Medical History Respiratory Patient has history of Asthma Denies history of Aspiration Cardiovascular Patient has history of Hypertension Review of Systems (ROS) Constitutional Symptoms (General Health) Denies complaints or symptoms of Fatigue, Fever, Chills, Marked Weight Change. Eyes Complains or has symptoms of Glasses / Contacts, has eye appt soon, states vision changes/decline Ear/Nose/Mouth/Throat Denies complaints or symptoms of Difficult clearing ears, Sinusitis. Hematologic/Lymphatic Denies complaints or symptoms of Bleeding / Clotting Disorders, Human Immunodeficiency Virus. Respiratory Complains or has symptoms of Shortness of Breath - with exertion. Cardiovascular Complains or has symptoms of LE edema - bilateral lower legs. Gastrointestinal Denies complaints or symptoms of Frequent diarrhea, Nausea, Vomiting. Endocrine Denies complaints or symptoms of Hepatitis, Thyroid disease, Polydypsia (Excessive Thirst). Genitourinary states hx kidney cyst-follows with nephrology Immunological Denies complaints or symptoms of Hives, Itching. Integumentary  (Skin) Complains or has symptoms of Wounds - stated one time wound on LLE, Swelling. Musculoskeletal Denies complaints or symptoms of Muscle Pain, Muscle Weakness. Neurologic Denies complaints or symptoms of Numbness/parasthesias, Focal/Weakness. Psychiatric Denies complaints or symptoms of Anxiety, Claustrophobia. Janet [name], [name] ([id number]) Objective Constitutional supine blood pressure is within target range for patient.Marland Kitchen respirations regular, non-labored and within target range for patient.Marland Kitchen temperature within target range for patient.. Well-nourished and well-hydrated in no acute distress. Vitals Time Taken: 10:00 AM, Height: 66 in, Source: Stated, Weight: 192 lbs, Source: Measured, BMI: 31, Temperature: 98.8 F, Pulse: 82 bpm, Respiratory Rate: 16 breaths/min, Blood Pressure: 124/74 mmHg. Eyes conjunctiva clear no eyelid edema noted. pupils equal round and reactive to light and accommodation. Ears, Nose, Mouth, and Throat no gross abnormality of ear auricles or external auditory canals. normal hearing noted during conversation. mucus membranes moist. Respiratory normal breathing without difficulty. Cardiovascular 2+ dorsalis pedis/posterior tibialis pulses. 1+ pitting edema of the bilateral lower extremities. Musculoskeletal normal gait  and posture. no significant deformity or arthritic changes, no loss or range of motion, no clubbing. Psychiatric this patient is able to make decisions and demonstrates good insight into disease process. Alert and Oriented x 3. pleasant and cooperative. General Notes: Upon inspection patient's wound bed actually showed signs of good granulation and minimal areas mainly there was a lot of slough and necrotic debris that did need to be cleared away. Subsequently I did perform sharp debridement to clear away the necrotic tissue today she tolerated this with some discomfort again I was trying to be as careful as possible. I did not clear away everything  but nonetheless was able to clear away a majority of the necrotic tissue. I do believe that utilizing Hydrofera Blue could be appropriate at this point underneath the compression wrap. Integumentary (Hair, Skin) Wound #1 status is Open. Original cause of wound was Blister. The date acquired was: 05/15/2020. The wound is located on the [facility] Lower Leg. The wound measures 3.8cm length x 2cm width x 0.1cm depth; 5.969cm^2 area and 0.597cm^3 volume. There is Fat Layer (Subcutaneous Tissue) exposed. There is no tunneling or undermining noted. There is a medium amount of serosanguineous drainage noted. There is medium (34-66%) red granulation within the wound bed. There is a medium (34-66%) amount of necrotic tissue within the wound bed including Adherent Slough. Wound #2 status is Open. Original cause of wound was Blister. The date acquired was: 05/15/2020. The wound is located on the Left,Proximal,Anterior Lower Leg. The wound measures 1cm length x 1.5cm width x 0.1cm depth; 1.178cm^2 area and 0.118cm^3 volume. There is Fat Layer (Subcutaneous Tissue) exposed. There is no tunneling or undermining noted. There is a medium amount of serosanguineous drainage noted. There is medium (34-66%) red granulation within the wound bed. There is a medium (34-66%) amount of necrotic tissue within the wound bed including Adherent Slough. Assessment Active Problems ICD-10 Venous insufficiency (chronic) (peripheral) Lymphedema, not elsewhere classified Non-pressure chronic ulcer of other part of left lower leg with fat layer exposed Procedures Wound #1 Heringer, Deiona M. ([id number]) Pre-procedure diagnosis of Wound #1 is a Venous Leg Ulcer located on the Left,Distal Lower Leg .Severity of Tissue Pre Debridement is: Fat layer exposed. There was a Excisional Skin/Subcutaneous Tissue Debridement with a total area of 7.6 sq cm performed by Tommie Sams., PA-C. With the following instrument(s): Curette  Material removed includes Subcutaneous Tissue, Slough, and Biofilm after achieving pain control using Lidocaine. A time out was conducted at 10:33, prior to the start of the procedure. A Minimum amount of bleeding was controlled with Pressure. The procedure was tolerated well. Post Debridement Measurements: 3.8cm length x 2cm width x 0.1cm depth; 0.597cm^3 volume. Character of Wound/Ulcer Post Debridement is improved. Severity of Tissue Post Debridement is: Fat layer exposed. Post procedure Diagnosis Wound #1: Same as Pre-Procedure Pre-procedure diagnosis of Wound #1 is a Venous Leg Ulcer located on the Left,Distal Lower Leg . There was a Three Layer Compression Therapy Procedure by Donnamarie Poag, RN. Post procedure Diagnosis Wound #1: Same as Pre-Procedure Wound #2 Pre-procedure diagnosis of Wound #2 is a Venous Leg Ulcer located on the Left,Proximal Lower Leg .Severity of Tissue Pre Debridement is: Fat layer exposed. There was a Excisional Skin/Subcutaneous Tissue Debridement with a total area of 1.5 sq cm performed by Tommie Sams., PA-C. With the following instrument(s): Curette Material removed includes Subcutaneous Tissue, Slough, and Biofilm after achieving pain control using Lidocaine. A time out was conducted at 10:33, prior to the start  of the procedure. A Minimum amount of bleeding was controlled with Pressure. The procedure was tolerated well. Post Debridement Measurements: 1cm length x 1.5cm width x 0.1cm depth; 0.118cm^3 volume. Character of Wound/Ulcer Post Debridement is improved. Severity of Tissue Post Debridement is: Fat layer exposed. Post procedure Diagnosis Wound #2: Same as Pre-Procedure Plan Follow-up Appointments: Return Appointment in 1 week. - weekly wraps Nurse Visit as needed - call to schedule if needed Bathing/ Shower/ Hygiene: May shower with wound dressing protected with water repellent cover or cast protector. No tub bath. Edema Control - Lymphedema / Segmental  Compressive Device / Other: Optional: One layer of unna paste to top of compression wrap (to act as an anchor). - left leg Patient to wear own compression stockings. Remove compression stockings every night before going to bed and put on every morning when getting up. - right leg-see Elastic Therapy contact and measurements Elevate legs to the level of the heart and pump ankles as often as possible Elevate leg(s) parallel to the floor when sitting. DO YOUR BEST to sleep in the bed at night. DO NOT sleep in your recliner. Long hours of sitting in a recliner leads to swelling of the legs and/or potential wounds on your backside. Medications-Please add to medication list.: Take one '500mg'$  Tylenol (Acetaminophen) and one '200mg'$  Motrin (Ibuprofen) every 6 hours for pain. Do not take ibuprofen if you are on blood thinners or have stomach ulcers. - as needed for pain if not contraindicated by other physician WOUND #1: - Lower Leg Wound Laterality: Left, Distal Cleanser: Soap and Water 1 x Per Week/30 Days Discharge Instructions: Gently cleanse wound with antibacterial soap, rinse and pat dry prior to dressing wounds Primary Dressing: Hydrofera Blue Ready Transfer Foam, 4x5 (in/in) 1 x Per Week/30 Days Discharge Instructions: Apply Hydrofera Blue Ready to wound bed as directed Secondary Dressing: ABD Pad 5x9 (in/in) 1 x Per Week/30 Days Discharge Instructions: Cover with ABD pad Compression Wrap: Profore Lite LF 3 Multilayer Compression Bandaging System 1 x Per Week/30 Days Discharge Instructions: Apply 3 multi-layer wrap as prescribed. WOUND #2: - Lower Leg Wound Laterality: Left, Proximal Cleanser: Soap and Water 1 x Per Week/30 Days Discharge Instructions: Gently cleanse wound with antibacterial soap, rinse and pat dry prior to dressing wounds Primary Dressing: Hydrofera Blue Ready Transfer Foam, 4x5 (in/in) 1 x Per Week/30 Days Discharge Instructions: Apply Hydrofera Blue Ready to wound bed as  directed Secondary Dressing: ABD Pad 5x9 (in/in) 1 x Per Week/30 Days Discharge Instructions: Cover with ABD pad Compression Wrap: Profore Lite LF 3 Multilayer Compression Bandaging System 1 x Per Week/30 Days Discharge Instructions: Apply 3 multi-layer wrap as prescribed. 1. Would recommend currently that we going continue with the wound care measures as before with regard to compression I think this is necessary we had a big discussion about this today and she is willing to give it a try. Hopefully she will be able to tolerate this. 2. Muscle and recommend Hydrofera Blue be used as the dressing of choice followed by an ABD pad. 3. I am also can recommend a 3 layer compression wrap to help with edema control. 4. I did discuss the possibility of ordering juxta light compression wraps next week depending on how she is doing and if we can get a good measurement. Obviously is not ideal as compared to 3 layer compression wraps but nonetheless is better than nothing if she is not able to tolerate the wrap currently. She voiced understanding. Janet [name], Janet  M. ([id number]) We will see patient back for reevaluation in 1 week here in the clinic. If anything worsens or changes patient will contact our office for additional recommendations. Electronic Signature(s) Signed: 08/29/2020 3:07:58 PM By: Worthy Keeler PA-C Entered By: Worthy Keeler on 08/29/2020 15:07:58 Janet [name], Gentry M. ([id number]) -------------------------------------------------------------------------------- ROS/PFSH Details Patient Name: Janet [name]. Date of Service: 08/29/2020 9:45 AM Medical Record Number: [id number] Patient Account Number: [id number] Date of Birth/Sex: 28-Dec-1950 (70 y.o. F) Treating RN: Donnamarie Poag Primary Care Provider: Delight Stare Other Clinician: Referring Provider: Eulogio Ditch Treating Provider/Extender: Skipper Cliche in Treatment: 0 Information Obtained From Patient Constitutional Symptoms (General  Health) Complaints and Symptoms: Negative for: Fatigue; Fever; Chills; Marked Weight Change Eyes Complaints and Symptoms: Positive for: Glasses / Contacts Review of System Notes: has eye appt soon, states vision changes/decline Ear/Nose/Mouth/Throat Complaints and Symptoms: Negative for: Difficult clearing ears; Sinusitis Hematologic/Lymphatic Complaints and Symptoms: Negative for: Bleeding / Clotting Disorders; Human Immunodeficiency Virus Respiratory Complaints and Symptoms: Positive for: Shortness of Breath - with exertion Medical History: Positive for: Asthma Negative for: Aspiration Cardiovascular Complaints and Symptoms: Positive for: LE edema - bilateral lower legs Medical History: Positive for: Hypertension Gastrointestinal Complaints and Symptoms: Negative for: Frequent diarrhea; Nausea; Vomiting Endocrine Complaints and Symptoms: Negative for: Hepatitis; Thyroid disease; Polydypsia (Excessive Thirst) Immunological Complaints and Symptoms: Negative for: Hives; Itching Integumentary (Skin) Rohrer, Johnesha M. ([id number]) Complaints and Symptoms: Positive for: Wounds - stated one time wound on LLE; Swelling Musculoskeletal Complaints and Symptoms: Negative for: Muscle Pain; Muscle Weakness Neurologic Complaints and Symptoms: Negative for: Numbness/parasthesias; Focal/Weakness Psychiatric Complaints and Symptoms: Negative for: Anxiety; Claustrophobia Genitourinary Complaints and Symptoms: Review of System Notes: states hx kidney cyst-follows with nephrology Oncologic Immunizations Pneumococcal Vaccine: Received Pneumococcal Vaccination: No Implantable Devices None Family and Social History Never smoker; Marital Status - Married; Alcohol Use: Never; Drug Use: No History; Caffeine Use: Never; Financial Concerns: No; Food, Clothing or Shelter Needs: No; Support System Lacking: No; Transportation Concerns: No Electronic Signature(s) Signed: 08/29/2020 11:50:15  AM By: Donnamarie Poag Signed: 08/29/2020 4:23:01 PM By: Worthy Keeler PA-C Entered By: Donnamarie Poag on 08/29/2020 10:08:50 Hueber, Mieka M. ([id number]) -------------------------------------------------------------------------------- SuperBill Details Patient Name: Janet [name]. Date of Service: 08/29/2020 Medical Record Number: [id number] Patient Account Number: [id number] Date of Birth/Sex: 03/23/1950 (70 y.o. F) Treating RN: Donnamarie Poag Primary Care Provider: Delight Stare Other Clinician: Referring Provider: Eulogio Ditch Treating Provider/Extender: Skipper Cliche in Treatment: 0 Diagnosis Coding ICD-10 Codes Code Description I87.2 Venous insufficiency (chronic) (peripheral) I89.0 Lymphedema, not elsewhere classified 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 of other part of left lower leg with fat layer exposed Facility Procedures CPT4 Code: [id number] Description: 99213 - WOUND CARE VISIT-LEV 3 EST PT Modifier: Quantity: 1 CPT4 Code: [id number] Description: [id number] - DEB SUBQ TISSUE 20 SQ CM/< Modifier: Quantity: 1 CPT4 Code: Description: ICD-10 Diagnosis Description 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 of other part of left lower leg with fat layer Modifier: exposed Quantity: Physician Procedures CPT4 Code: [id number] Description: [id number] - WC PHYS LEVEL 4 - NEW PT Modifier: 25 Quantity: 1 CPT4 Code: Description: ICD-10 Diagnosis Description I87.2 Venous insufficiency (chronic) (peripheral) I89.0 Lymphedema, not elsewhere classified 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

## 2020-08-29 NOTE — Progress Notes (Signed)
Janet [name], Janet [name] ([id number]) Visit Report for 08/29/2020 Abuse/Suicide Risk Screen Details Patient Name: Janet [name]. Date of Service: 08/29/2020 9:45 AM Medical Record Number: [id number] Patient Account Number: [id number] Date of Birth/Sex: [birth year] (70 y.o. F) Treating RN: Donnamarie Poag Primary Care Tycho Cheramie: Delight Stare Other Clinician: Referring Constantina Laseter: Eulogio Ditch Treating Cortney Mckinney/Extender: Skipper Cliche in Treatment: 0 Abuse/Suicide Risk Screen Items Answer ABUSE RISK SCREEN: Has anyone close to you tried to hurt or harm you recentlyo No Do you feel uncomfortable with anyone in your familyo No Has anyone forced you do things that you didnot want to doo No Electronic Signature(s) Signed: 08/29/2020 11:50:15 AM By: Donnamarie Poag Entered By: Donnamarie Poag on 08/29/2020 10:09:17 [name], [name] ([id number]) -------------------------------------------------------------------------------- Activities of Daily Living Details Patient Name: Janet [name]. Date of Service: 08/29/2020 9:45 AM Medical Record Number: [id number] Patient Account Number: [id number] Date of Birth/Sex: 07-31-1950 (70 y.o. F) Treating RN: Donnamarie Poag Primary Care Emoree Sasaki: Delight Stare Other Clinician: Referring Marcea Rojek: Eulogio Ditch Treating Dayvion Sans/Extender: Skipper Cliche in Treatment: 0 Activities of Daily Living Items Answer Activities of Daily Living (Please select one for each item) Drive Automobile Completely Able Take Medications Completely Able Use Telephone Completely Able Care for Appearance Completely Able Use Toilet Completely Able Bath / Shower Completely Able Dress Self Completely Able Feed Self Completely Able Walk Completely Able Get In / Out Bed Completely Able Housework Completely Able Prepare Meals Completely [place] for Self Completely Able Electronic Signature(s) Signed: 08/29/2020 11:50:15 AM By: Donnamarie Poag Entered By: Donnamarie Poag on  08/29/2020 10:09:42 [name], [name] ([id number]) -------------------------------------------------------------------------------- Education Screening Details Patient Name: Janet [name]. Date of Service: 08/29/2020 9:45 AM Medical Record Number: [id number] Patient Account Number: [id number] Date of Birth/Sex: 08-12-1950 (70 y.o. F) Treating RN: Donnamarie Poag Primary Care Emry Tobin: Delight Stare Other Clinician: Referring Deland Slocumb: Eulogio Ditch Treating Naly Schwanz/Extender: Skipper Cliche in Treatment: 0 Primary Learner Assessed: Patient Learning Preferences/Education Level/Primary Language Learning Preference: Explanation, Demonstration Highest Education Level: High School Preferred Language: English Cognitive Barrier Language Barrier: No Translator Needed: No Memory Deficit: No Emotional Barrier: No Cultural/Religious Beliefs Affecting Medical Care: No Physical Barrier Impaired Vision: No Impaired Hearing: No Decreased Hand dexterity: No Knowledge/Comprehension Knowledge Level: High Comprehension Level: High Ability to understand written instructions: High Ability to understand verbal instructions: High Motivation Anxiety Level: Calm Cooperation: Cooperative Education Importance: Acknowledges Need Interest in Health Problems: Asks Questions Perception: Coherent Willingness to Engage in Self-Management High Activities: Readiness to Engage in Self-Management High Activities: Electronic Signature(s) Signed: 08/29/2020 11:50:15 AM By: Donnamarie Poag Entered ByDonnamarie Poag on 08/29/2020 10:10:26 [name], [name] ([id number]) -------------------------------------------------------------------------------- Fall Risk Assessment Details Patient Name: Janet [name]. Date of Service: 08/29/2020 9:45 AM Medical Record Number: [id number] Patient Account Number: [id number] Date of Birth/Sex: 1950/04/18 (70 y.o. F) Treating RN: Donnamarie Poag Primary Care Baley Lorimer: Delight Stare Other  Clinician: Referring Eliora Nienhuis: Eulogio Ditch Treating Jesyka Slaght/Extender: Skipper Cliche in Treatment: 0 Fall Risk Assessment Items Have you had 2 or more falls in the last 12 monthso 0 No Have you had any fall that resulted in injury in the last 12 monthso 0 No FALLS RISK SCREEN History of falling - immediate or within 3 months 0 No Secondary diagnosis (Do you have 2 or more medical diagnoseso) 0 No Ambulatory aid None/bed rest/wheelchair/nurse 0 Yes Crutches/cane/walker 0 No Furniture 0 No Intravenous therapy Access/Saline/Heparin Lock 0 No Gait/Transferring Normal/ bed rest/ wheelchair 0 Yes Weak (short steps with or without shuffle,  stooped but able to lift head while walking, may 0 No seek support from furniture) Impaired (short steps with shuffle, may have difficulty arising from chair, head down, impaired 0 No balance) Mental Status Oriented to own ability 0 Yes Electronic Signature(s) Signed: 08/29/2020 11:50:15 AM By: Donnamarie Poag Entered By: Donnamarie Poag on 08/29/2020 10:10:56 [name], [name] ([id number]) -------------------------------------------------------------------------------- Foot Assessment Details Patient Name: Janet [name]. Date of Service: 08/29/2020 9:45 AM Medical Record Number: [id number] Patient Account Number: [id number] Date of Birth/Sex: August 07, 1950 (70 y.o. F) Treating RN: Donnamarie Poag Primary Care Gerson Fauth: Delight Stare Other Clinician: Referring Jaymason Ledesma: Eulogio Ditch Treating Wilmina Maxham/Extender: Skipper Cliche in Treatment: 0 Foot Assessment Items Site Locations + = Sensation present, - = Sensation absent, C = Callus, U = Ulcer R = Redness, W = Warmth, M = Maceration, PU = Pre-ulcerative lesion F = Fissure, S = Swelling, D = Dryness Assessment Right: Left: Other Deformity: No No Prior Foot Ulcer: No No Prior Amputation: No No Charcot Joint: No No Ambulatory Status: Ambulatory Without Help Gait: Steady Electronic Signature(s) Signed:  08/29/2020 11:50:15 AM By: Donnamarie Poag Entered By: Donnamarie Poag on 08/29/2020 10:13:21 [name], [name] ([id number]) -------------------------------------------------------------------------------- Nutrition Risk Screening Details Patient Name: Janet [name]. Date of Service: 08/29/2020 9:45 AM Medical Record Number: [id number] Patient Account Number: [id number] Date of Birth/Sex: June 19, 1950 (70 y.o. F) Treating RN: Donnamarie Poag Primary Care Tabytha Gradillas: Delight Stare Other Clinician: Referring Kaushal Vannice: Eulogio Ditch Treating Mechille Varghese/Extender: Skipper Cliche in Treatment: 0 Height (in): 66 Weight (lbs): 192 Body Mass Index (BMI): 31 Nutrition Risk Screening Items Score Screening NUTRITION RISK SCREEN: I have an illness or condition that made me change the kind and/or amount of food I eat 0 No I eat fewer than two meals per day 0 No I eat few fruits and vegetables, or milk products 0 No I have three or more drinks of beer, liquor or wine almost every day 0 No I have tooth or mouth problems that make it hard for me to eat 0 No I don't always have enough money to buy the food I need 0 No I eat alone most of the time 0 No I take three or more different prescribed or over-the-counter drugs a day 0 No Without wanting to, I have lost or gained 10 pounds in the last six months 0 No I am not always physically able to shop, cook and/or feed myself 0 No Nutrition Protocols Good Risk Protocol 0 No interventions needed Moderate Risk Protocol High Risk Proctocol Risk Level: Good Risk Score: 0 Electronic Signature(s) Signed: 08/29/2020 11:50:15 AM By: Donnamarie Poag Entered ByDonnamarie Poag on 08/29/2020 10:11:11

## 2020-09-05 ENCOUNTER — Ambulatory Visit
Admission: RE | Admit: 2020-09-05 | Discharge: 2020-09-05 | Disposition: A | Discharge status: Home / Self Care | Payer: Medicare Other | Source: Ambulatory Visit | Attending: Physician Assistant | Admitting: Physician Assistant

## 2020-09-05 ENCOUNTER — Encounter: Payer: Medicare Other | Admitting: Physician Assistant

## 2020-09-05 ENCOUNTER — Other Ambulatory Visit: Payer: Self-pay

## 2020-09-05 ENCOUNTER — Other Ambulatory Visit: Payer: Self-pay | Admitting: Physician Assistant

## 2020-09-05 DIAGNOSIS — R2241 Localized swelling, mass and lump, right lower limb: Secondary | ICD-10-CM | POA: Insufficient documentation

## 2020-09-05 DIAGNOSIS — L97822 Non-pressure chronic ulcer of other part of left lower leg with fat layer exposed: Secondary | ICD-10-CM | POA: Diagnosis not present

## 2020-09-05 NOTE — Progress Notes (Addendum)
Janet, [name] ([id number]) Visit Report for 09/05/2020 Chief Complaint Document Details Patient Name: Janet [name], Janet [name]. Date of Service: 09/05/2020 12:30 PM Medical Record Number: [id number] Patient Account Number: [id number] Date of Birth/Sex: Jul 16, 1950 (70 y.o. F) Treating RN: Cornell Barman Primary Care Provider: Delight Stare Other Clinician: Referring Provider: Delight Stare Treating Provider/Extender: Skipper Cliche in Treatment: 1 Information Obtained from: Patient Chief Complaint Left LE Ulcers Electronic Signature(s) Signed: 09/05/2020 12:53:37 PM By: Worthy Keeler PA-C Entered By: Worthy Keeler on 09/05/2020 12:53:37 Enterline, Ajaya M. ([id number]) -------------------------------------------------------------------------------- Debridement Details Patient Name: Janet [name]. Date of Service: 09/05/2020 12:30 PM Medical Record Number: [id number] Patient Account Number: [id number] Date of Birth/Sex: Apr 22, 1950 (70 y.o. F) Treating RN: Cornell Barman Primary Care Provider: Delight Stare Other Clinician: Referring Provider: Delight Stare Treating Provider/Extender: Skipper Cliche in Treatment: 1 Debridement Performed for Wound #1 Left,Distal,Anterior Lower Leg Assessment: Performed By: Physician Tommie Sams., PA-C Debridement Type: Debridement Severity of Tissue Pre Debridement: Fat layer exposed Level of Consciousness (Pre- Awake and Alert procedure): Pre-procedure Verification/Time Out Yes - 13:16 Taken: Total Area Debrided (L x W): 4.2 (cm) x 2 (cm) = 8.4 (cm) Tissue and other material Viable, Non-Viable, Slough, Subcutaneous, Biofilm, Slough debrided: Level: Skin/Subcutaneous Tissue Debridement Description: Excisional Instrument: Curette Bleeding: Moderate Hemostasis Achieved: Pressure Response to Treatment: Procedure was tolerated well Level of Consciousness (Post- Awake and Alert procedure): Post Debridement Measurements of Total Wound Length: (cm) 4.2 Width:  (cm) 2 Depth: (cm) 0.3 Volume: (cm) 1.979 Character of Wound/Ulcer Post Debridement: Stable Severity of Tissue Post Debridement: Fat layer exposed Post Procedure Diagnosis Same as Pre-procedure Electronic Signature(s) Signed: 09/05/2020 5:15:29 PM By: Worthy Keeler PA-C Signed: 09/07/2020 10:03:11 AM By: Gretta Cool, BSN, RN, CWS, Kim RN, BSN Entered By: Gretta Cool, BSN, RN, CWS, Kim on 09/05/2020 13:17:22 Vanantwerp, Deiondra M. ([id number]) -------------------------------------------------------------------------------- Debridement Details Patient Name: Janet [name]. Date of Service: 09/05/2020 12:30 PM Medical Record Number: [id number] Patient Account Number: [id number] Date of Birth/Sex: 1950/04/06 (70 y.o. F) Treating RN: Cornell Barman Primary Care Provider: Delight Stare Other Clinician: Referring Provider: Delight Stare Treating Provider/Extender: Skipper Cliche in Treatment: 1 Debridement Performed for Wound #2 Left,Proximal,Anterior Lower Leg Assessment: Performed By: Physician Tommie Sams., PA-C Debridement Type: Debridement Severity of Tissue Pre Debridement: Fat layer exposed Level of Consciousness (Pre- Awake and Alert procedure): Pre-procedure Verification/Time Out Yes - 13:16 Taken: Total Area Debrided (L x W): 2.5 (cm) x 2 (cm) = 5 (cm) Tissue and other material Viable, Non-Viable, Slough, Subcutaneous, Biofilm, Slough debrided: Level: Skin/Subcutaneous Tissue Debridement Description: Excisional Instrument: Curette Bleeding: Moderate Hemostasis Achieved: Pressure Response to Treatment: Procedure was tolerated well Level of Consciousness (Post- Awake and Alert procedure): Post Debridement Measurements of Total Wound Length: (cm) 2.5 Width: (cm) 2 Depth: (cm) 0.3 Volume: (cm) 1.178 Character of Wound/Ulcer Post Debridement: Stable Severity of Tissue Post Debridement: Fat layer exposed Post Procedure Diagnosis Same as Pre-procedure Electronic Signature(s) Signed:  09/05/2020 5:15:29 PM By: Worthy Keeler PA-C Signed: 09/07/2020 10:03:11 AM By: Gretta Cool, BSN, RN, CWS, Kim RN, BSN Entered By: Gretta Cool, BSN, RN, CWS, Kim on 09/05/2020 13:17:51 Casstevens, Zakyra M. ([id number]) -------------------------------------------------------------------------------- HPI Details Patient Name: Janet [name]. Date of Service: 09/05/2020 12:30 PM Medical Record Number: [id number] Patient Account Number: [id number] Date of Birth/Sex: [birth year] (70 y.o. F) Treating RN: Cornell Barman Primary Care Provider: Delight Stare Other Clinician: Referring Provider: Delight Stare Treating Provider/Extender: Skipper Cliche in Treatment: 1 History of Present Illness HPI Description: 08/29/2020 upon evaluation  today patient appears to be doing somewhat poorly in regard to what appeared to be venous leg ulcers on the left leg that she has been dealing with since around the beginning of May. Fortunately she does not have any signs of obvious infection unfortunately she does have quite a bit of necrotic tissue that does not really need to be debrided. She has been seeing vascular and they did wrap her for a time with what was likely an Haematologist. With that being said the [facility] boot has given her some trouble as far as feeling claustrophobic was concerned she is not sure if she is good to be able to tolerate a compression wrap currently although we will get a try she is in agreement with Aleve given this shot. Fortunately I do not see any signs of active infection systemically which is great news and even locally she seems to be doing quite well today. Patient's Right ABI is 1.08 with a TBI of 0.79 and the left ABI is 1.15 with a TBI of 0.84 on 08/03/20. She also has a history of chronic venous insufficiency and lymphedema but otherwise no major medical problems. 09/05/2020 upon evaluation today patient unfortunately appears to be having some issues today with her right leg this is completely different  from what we have been taking care of on the left. She actually has some swelling and an area of erythema and pain which is in the medial calf region. There is no identifiable abscess definitely this could just be cellulitis but with her history of DVT I think that we need to have this checked out. Electronic Signature(s) Signed: 09/05/2020 1:24:16 PM By: Worthy Keeler PA-C Entered By: Worthy Keeler on 09/05/2020 13:24:16 Vig, Wylma M. ([id number]) -------------------------------------------------------------------------------- Physical Exam Details Patient Name: Janet [name]. Date of Service: 09/05/2020 12:30 PM Medical Record Number: [id number] Patient Account Number: [id number] Date of Birth/Sex: 24-Dec-1950 (70 y.o. F) Treating RN: Cornell Barman Primary Care Provider: Delight Stare Other Clinician: Referring Provider: Delight Stare Treating Provider/Extender: Skipper Cliche in Treatment: 1 Constitutional Chronically ill appearing but in no apparent acute distress. Respiratory normal breathing without difficulty. Psychiatric this patient is able to make decisions and demonstrates good insight into disease process. Alert and Oriented x 3. pleasant and cooperative. Notes Upon inspection patient's wound bed actually showed signs of fairly good granulation at this point. Fortunately there does not appear to be any signs of active infection which is great news. She does have still some slough and biofilm buildup on the surface of the wound. I do believe that she would benefit from the [facility] still being used for the time being. This is on the left leg. I did perform sharp debridement to clear away as much as I could although I was very careful due to the fact that she is obviously having pain here. With regard to her right leg this is completely new she has a area of painful swelling and erythema noted on the medial aspect of her right calf. She has a history of DVT and because of  this I am very concerned about at least evaluating for the possibility of a DVT here although this may very well just be a cellulitis type scenario I am going to place her on an antibiotic as well. Electronic Signature(s) Signed: 09/05/2020 1:28:51 PM By: Worthy Keeler PA-C Entered By: Worthy Keeler on 09/05/2020 13:28:51 Fidalgo, Roneka M. ([id number]) -------------------------------------------------------------------------------- Physician Orders Details Patient Name: Janet [name].  Date of Service: 09/05/2020 12:30 PM Medical Record Number: [id number] Patient Account Number: [id number] Date of Birth/Sex: [name] 04, 1951 (70 y.o. F) Treating RN: Cornell Barman Primary Care Provider: Delight Stare Other Clinician: Referring Provider: Delight Stare Treating Provider/Extender: Skipper Cliche in Treatment: 1 Verbal / Phone Orders: No Diagnosis Coding ICD-10 Coding Code Description I87.2 Venous insufficiency (chronic) (peripheral) I89.0 Lymphedema, not elsewhere classified 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 of other part of left lower leg with fat layer exposed Follow-up Appointments o Return Appointment in 1 week. - weekly wraps o Nurse Visit as needed - Monday and Thursday Bathing/ Shower/ Hygiene o May shower with wound dressing protected with water repellent cover or cast protector. o No tub bath. Edema Control - Lymphedema / Segmental Compressive Device / Other o Optional: One layer of unna paste to top of compression wrap (to act as an anchor). - left leg o Patient to wear own compression stockings. Remove compression stockings every night before going to bed and put on every morning when getting up. - right leg-see Elastic Therapy contact and measurements o Elevate legs to the level of the heart and pump ankles as often as possible o Elevate leg(s) parallel to the floor when sitting. o DO YOUR BEST to sleep in the bed at night. DO NOT sleep in your recliner. Long hours  of sitting in a recliner leads to swelling of the legs and/or potential wounds on your backside. Medications-Please add to medication list. o Take one '500mg'$  Tylenol (Acetaminophen) and one '200mg'$  Motrin (Ibuprofen) every 6 hours for pain. Do not take ibuprofen if you are on blood thinners or have stomach ulcers. - as needed for pain if not contraindicated by other physician Wound Treatment Wound #1 - Lower Leg Wound Laterality: Left, Anterior, Distal Cleanser: Soap and Water 1 x Per Week/30 Days Discharge Instructions: Gently cleanse wound with antibacterial soap, rinse and pat dry prior to dressing wounds Primary Dressing: Hydrofera Blue Ready Transfer Foam, 4x5 (in/in) 1 x Per Week/30 Days Discharge Instructions: Apply Hydrofera Blue Ready to wound bed as directed Secondary Dressing: ABD Pad 5x9 (in/in) 1 x Per Week/30 Days Discharge Instructions: Cover with ABD pad Compression Wrap: Profore Lite LF 3 Multilayer Compression Bandaging System 1 x Per Week/30 Days Discharge Instructions: Apply 3 multi-layer wrap as prescribed. Wound #2 - Lower Leg Wound Laterality: Left, Anterior, Proximal Cleanser: Soap and Water 1 x Per Week/30 Days Discharge Instructions: Gently cleanse wound with antibacterial soap, rinse and pat dry prior to dressing wounds Primary Dressing: Hydrofera Blue Ready Transfer Foam, 4x5 (in/in) 1 x Per Week/30 Days Discharge Instructions: Apply Hydrofera Blue Ready to wound bed as directed Secondary Dressing: ABD Pad 5x9 (in/in) 1 x Per Week/30 Days Discharge Instructions: Cover with ABD pad Compression Wrap: Profore Lite LF 3 Multilayer Compression Bandaging System 1 x Per Week/30 Days Discharge Instructions: Apply 3 multi-layer wrap as prescribed. TOYNA, DAHLQUIST ([id number]) Services and Therapies o Doppler Studies-Venous-Unilateral - Right LE Ultrasound to rule out blood clot Patient Medications Allergies: penicillin Notifications Medication Indication Start  End doxycycline hyclate 09/05/2020 DOSE 1 - oral 100 mg capsule - 1 capsule oral taken 2 times per day for 14 days Electronic Signature(s) Signed: 09/05/2020 5:14:38 PM By: Worthy Keeler PA-C Entered By: Worthy Keeler on 09/05/2020 17:14:38 Word, Deaisa M. ([id number]) -------------------------------------------------------------------------------- Problem List Details Patient Name: Janet [name]. Date of Service: 09/05/2020 12:30 PM Medical Record Number: [id number] Patient Account Number: [id number] Date of Birth/Sex: [birth year] (70 y.o. F) Treating RN: Gretta Cool,  [place] Primary Care Provider: Delight Stare Other Clinician: Referring Provider: Delight Stare Treating Provider/Extender: Skipper Cliche in Treatment: 1 Active Problems ICD-10 Encounter Code Description Active Date MDM Diagnosis I87.2 Venous insufficiency (chronic) (peripheral) 08/29/2020 No Yes I89.0 Lymphedema, not elsewhere classified 08/29/2020 No Yes 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 of other part of left lower leg with fat layer 08/29/2020 No Yes exposed L03.115 Cellulitis of right lower limb 09/05/2020 No Yes R22.41 Localized swelling, mass and lump, right lower limb 09/05/2020 No Yes Inactive Problems Resolved Problems Electronic Signature(s) Signed: 09/05/2020 1:21:33 PM By: Worthy Keeler PA-C Previous Signature: 09/05/2020 12:53:29 PM Version By: Worthy Keeler PA-C Entered By: Worthy Keeler on 09/05/2020 13:21:33 Nauta, Brittony M. ([id number]) -------------------------------------------------------------------------------- Progress Note Details Patient Name: Janet [name]. Date of Service: 09/05/2020 12:30 PM Medical Record Number: [id number] Patient Account Number: [id number] Date of Birth/Sex: 03-May-1950 (70 y.o. F) Treating RN: Cornell Barman Primary Care Provider: Delight Stare Other Clinician: Referring Provider: Delight Stare Treating Provider/Extender: Skipper Cliche in Treatment: 1 Subjective Chief  Complaint Information obtained from Patient Left LE Ulcers History of Present Illness (HPI) 08/29/2020 upon evaluation today patient appears to be doing somewhat poorly in regard to what appeared to be venous leg ulcers on the left leg that she has been dealing with since around the beginning of May. Fortunately she does not have any signs of obvious infection unfortunately she does have quite a bit of necrotic tissue that does not really need to be debrided. She has been seeing vascular and they did wrap her for a time with what was likely an Haematologist. With that being said the [facility] boot has given her some trouble as far as feeling claustrophobic was concerned she is not sure if she is good to be able to tolerate a compression wrap currently although we will get a try she is in agreement with Aleve given this shot. Fortunately I do not see any signs of active infection systemically which is great news and even locally she seems to be doing quite well today. Patient's Right ABI is 1.08 with a TBI of 0.79 and the left ABI is 1.15 with a TBI of 0.84 on 08/03/20. She also has a history of chronic venous insufficiency and lymphedema but otherwise no major medical problems. 09/05/2020 upon evaluation today patient unfortunately appears to be having some issues today with her right leg this is completely different from what we have been taking care of on the left. She actually has some swelling and an area of erythema and pain which is in the medial calf region. There is no identifiable abscess definitely this could just be cellulitis but with her history of DVT I think that we need to have this checked out. Objective Constitutional Chronically ill appearing but in no apparent acute distress. Vitals Time Taken: 12:53 PM, Height: 66 in, Weight: 192 lbs, BMI: 31, Temperature: 98.3 F, Pulse: 74 bpm, Respiratory Rate: 16 breaths/min, Blood Pressure: 148/59 mmHg. Respiratory normal breathing without  difficulty. Psychiatric this patient is able to make decisions and demonstrates good insight into disease process. Alert and Oriented x 3. pleasant and cooperative. General Notes: Upon inspection patient's wound bed actually showed signs of fairly good granulation at this point. Fortunately there does not appear to be any signs of active infection which is great news. She does have still some slough and biofilm buildup on the surface of the wound. I do believe that she would benefit from the [facility] still being  used for the time being. This is on the left leg. I did perform sharp debridement to clear away as much as I could although I was very careful due to the fact that she is obviously having pain here. With regard to her right leg this is completely new she has a area of painful swelling and erythema noted on the medial aspect of her right calf. She has a history of DVT and because of this I am very concerned about at least evaluating for the possibility of a DVT here although this may very well just be a cellulitis type scenario I am going to place her on an antibiotic as well. Integumentary (Hair, Skin) Wound #1 status is Open. Original cause of wound was Blister. The date acquired was: 05/15/2020. The wound has been in treatment 1 weeks. The wound is located on the [facility] Lower Leg. The wound measures 4.2cm length x 2cm width x 0.2cm depth; 6.597cm^2 area and 1.319cm^3 volume. There is Fat Layer (Subcutaneous Tissue) exposed. There is no tunneling or undermining noted. There is a medium amount of serosanguineous drainage noted. There is small (1-33%) red granulation within the wound bed. There is a large (67-100%) amount of necrotic tissue within the wound bed including Adherent Slough. Wound #2 status is Open. Original cause of wound was Blister. The date acquired was: 05/15/2020. The wound has been in treatment 1 weeks. The wound is located on the Left,Proximal,Anterior  Lower Leg. The wound measures 2.5cm length x 2cm width x 0.2cm depth; 3.927cm^2 area and 0.785cm^3 volume. There is Fat Layer (Subcutaneous Tissue) exposed. There is no tunneling or undermining noted. There is a medium amount of serosanguineous drainage noted. There is small (1-33%) red granulation within the wound bed. There is a large (67-100%) amount of necrotic Maslanka, Alisandra M. ([id number]) tissue within the wound bed including Adherent Slough. Assessment Active Problems ICD-10 Venous insufficiency (chronic) (peripheral) Lymphedema, not elsewhere classified Non-pressure chronic ulcer of other part of left lower leg with fat layer exposed Cellulitis of right lower limb Localized swelling, mass and lump, right lower limb Procedures Wound #1 Pre-procedure diagnosis of Wound #1 is a Venous Leg Ulcer located on the Left,Distal,Anterior Lower Leg .Severity of Tissue Pre Debridement is: Fat layer exposed. There was a Excisional Skin/Subcutaneous Tissue Debridement with a total area of 8.4 sq cm performed by Tommie Sams., PA-C. With the following instrument(s): Curette to remove Viable and Non-Viable tissue/material. Material removed includes Subcutaneous Tissue, Slough, and Biofilm. A time out was conducted at 13:16, prior to the start of the procedure. A Moderate amount of bleeding was controlled with Pressure. The procedure was tolerated well. Post Debridement Measurements: 4.2cm length x 2cm width x 0.3cm depth; 1.979cm^3 volume. Character of Wound/Ulcer Post Debridement is stable. Severity of Tissue Post Debridement is: Fat layer exposed. Post procedure Diagnosis Wound #1: Same as Pre-Procedure Wound #2 Pre-procedure diagnosis of Wound #2 is a Venous Leg Ulcer located on the Left,Proximal,Anterior Lower Leg .Severity of Tissue Pre Debridement is: Fat layer exposed. There was a Excisional Skin/Subcutaneous Tissue Debridement with a total area of 5 sq cm performed by Tommie Sams., PA-C. With  the following instrument(s): Curette to remove Viable and Non-Viable tissue/material. Material removed includes Subcutaneous Tissue, Slough, and Biofilm. A time out was conducted at 13:16, prior to the start of the procedure. A Moderate amount of bleeding was controlled with Pressure. The procedure was tolerated well. Post Debridement Measurements: 2.5cm length x 2cm width x 0.3cm depth; 1.178cm^3 volume.  Character of Wound/Ulcer Post Debridement is stable. Severity of Tissue Post Debridement is: Fat layer exposed. Post procedure Diagnosis Wound #2: Same as Pre-Procedure Plan Follow-up Appointments: Return Appointment in 1 week. - weekly wraps Nurse Visit as needed - Monday and Thursday Bathing/ Shower/ Hygiene: May shower with wound dressing protected with water repellent cover or cast protector. No tub bath. Edema Control - Lymphedema / Segmental Compressive Device / Other: Optional: One layer of unna paste to top of compression wrap (to act as an anchor). - left leg Patient to wear own compression stockings. Remove compression stockings every night before going to bed and put on every morning when getting up. - right leg-see Elastic Therapy contact and measurements Elevate legs to the level of the heart and pump ankles as often as possible Elevate leg(s) parallel to the floor when sitting. DO YOUR BEST to sleep in the bed at night. DO NOT sleep in your recliner. Long hours of sitting in a recliner leads to swelling of the legs and/or potential wounds on your backside. Medications-Please add to medication list.: Take one '500mg'$  Tylenol (Acetaminophen) and one '200mg'$  Motrin (Ibuprofen) every 6 hours for pain. Do not take ibuprofen if you are on blood thinners or have stomach ulcers. - as needed for pain if not contraindicated by other physician Services and Therapies ordered were: Doppler Studies-Venous-Unilateral - Right LE Ultrasound to rule out blood clot The following medication(s) was  prescribed: doxycycline hyclate oral 100 mg capsule 1 1 capsule oral taken 2 times per day for 14 days starting 09/05/2020 WOUND #1: - Lower Leg Wound Laterality: Left, Anterior, Distal Cleanser: Soap and Water 1 x Per Week/30 Days Discharge Instructions: Gently cleanse wound with antibacterial soap, rinse and pat dry prior to dressing wounds Primary Dressing: Hydrofera Blue Ready Transfer Foam, 4x5 (in/in) 1 x Per Week/30 Days Cotugno, Elody M. ([id number]) Discharge Instructions: Apply Hydrofera Blue Ready to wound bed as directed Secondary Dressing: ABD Pad 5x9 (in/in) 1 x Per Week/30 Days Discharge Instructions: Cover with ABD pad Compression Wrap: Profore Lite LF 3 Multilayer Compression Bandaging System 1 x Per Week/30 Days Discharge Instructions: Apply 3 multi-layer wrap as prescribed. WOUND #2: - Lower Leg Wound Laterality: Left, Anterior, Proximal Cleanser: Soap and Water 1 x Per Week/30 Days Discharge Instructions: Gently cleanse wound with antibacterial soap, rinse and pat dry prior to dressing wounds Primary Dressing: Hydrofera Blue Ready Transfer Foam, 4x5 (in/in) 1 x Per Week/30 Days Discharge Instructions: Apply Hydrofera Blue Ready to wound bed as directed Secondary Dressing: ABD Pad 5x9 (in/in) 1 x Per Week/30 Days Discharge Instructions: Cover with ABD pad Compression Wrap: Profore Lite LF 3 Multilayer Compression Bandaging System 1 x Per Week/30 Days Discharge Instructions: Apply 3 multi-layer wrap as prescribed. 1. Would recommend currently that we go ahead and have the patient go for a ASAP evaluation for DVT of the right leg. Again this is opposite of where her wound is but nonetheless is something that does have me concerned currently. 2. I am also can recommend that we have the patient go ahead and continue as well with the [facility] for the left leg. 3. I am also can recommend that we continue with the 3 layer compression wrap on the left leg although she does need  to have this changed I think in between visits in order to ensure its not sliding down. We will see patient back for reevaluation in 1 week here in the clinic. If anything worsens or changes patient will  contact our office for additional recommendations. Addendum: 09/05/2020 I did receive a call from the ultrasound lab and subsequently they did note that the patient had a negative ultrasound DVT study today. Fortunately there is nothing else going on at this point to be of concern this is great news. I did send in the antibiotic therefore for her in order to try to see if that would help with the right leg. Electronic Signature(s) Signed: 09/05/2020 5:15:15 PM By: Worthy Keeler PA-C Previous Signature: 09/05/2020 1:29:38 PM Version By: Worthy Keeler PA-C Entered By: Worthy Keeler on 09/05/2020 17:15:14 Samonte, Loreena M. ([id number]) -------------------------------------------------------------------------------- SuperBill Details Patient Name: Janet [name]. Date of Service: 09/05/2020 Medical Record Number: [id number] Patient Account Number: [id number] Date of Birth/Sex: May 15, 1950 (70 y.o. F) Treating RN: Cornell Barman Primary Care Provider: Delight Stare Other Clinician: Referring Provider: Delight Stare Treating Provider/Extender: Skipper Cliche in Treatment: 1 Diagnosis Coding ICD-10 Codes Code Description I87.2 Venous insufficiency (chronic) (peripheral) I89.0 Lymphedema, not elsewhere classified 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 of other part of left lower leg with fat layer exposed L03.115 Cellulitis of right lower limb R22.41 Localized swelling, mass and lump, right lower limb Facility Procedures CPT4 Code: [id number] Description: [phone] - WOUND CARE VISIT-LEV 1 EST PT Modifier: Quantity: 1 CPT4 Code: [id number] Description: [id number] - DEB SUBQ TISSUE 20 SQ CM/< Modifier: Quantity: 1 CPT4 Code: Description: ICD-10 Diagnosis Description 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 of other  part of left lower leg with fat layer Modifier: exposed Quantity: Physician Procedures CPT4 [id number] Description: [id number] - WC PHYS LEVEL 4 - EST PT Modifier: 25 Quantity: 1 CPT4 Code: Description: ICD-10 Diagnosis Description I87.2 Venous insufficiency (chronic) (peripheral) I89.0 Lymphedema, not elsewhere classified 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 of other part of left lower leg with fat layer L03.115 Cellulitis of right lower  limb Modifier: exposed Quantity: CPT4 Code: [id number] Description: [id number] - WC PHYS SUBQ TISS 20 SQ CM Modifier: Quantity: 1 CPT4 Code: Description: ICD-10 Diagnosis Description 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 of other part of left lower leg with fat layer Modifier: exposed Quantity: Electronic Signature(s) Signed: 09/05/2020 5:15:29 PM By: Worthy Keeler PA-C Signed: 09/07/2020 10:03:11 AM By: Gretta Cool, BSN, RN, CWS, Kim RN, BSN Previous Signature: 09/05/2020 1:30:11 PM Version By: Worthy Keeler PA-C Entered By: Gretta Cool, BSN, RN, CWS, Kim on 09/05/2020 13:40:15

## 2020-09-07 NOTE — Progress Notes (Signed)
Janet, [name] ([id number]) Visit Report for 09/05/2020 Arrival Information Details Patient Name: Janet [name], Janet [name]. Date of Service: 09/05/2020 12:30 PM Medical Record Number: [id number] Patient Account Number: [id number] Date of Birth/Sex: [birth year] (70 y.o. F) Treating RN: Cornell Barman Primary Care Latrena Benegas: Delight Stare Other Clinician: Referring Firmin Belisle: Delight Stare Treating Sho Salguero/Extender: Skipper Cliche in Treatment: 1 Visit Information History Since Last Visit Added or deleted any medications: No Patient Arrived: Ambulatory Has Dressing in Place as Prescribed: Yes Arrival Time: 12:52 Has Compression in Place as Prescribed: Yes Accompanied By: self Pain Present Now: Yes Transfer Assistance: None Patient Has Alerts: Yes Patient Alerts: Patient on Blood Thinner Aspirin '81mg'$  AVVS ABI/TBI 7/22 ABI L- 1.15/ R- 1.08 Electronic Signature(s) Signed: 09/07/2020 10:03:11 AM By: Gretta Cool, BSN, RN, CWS, Kim RN, BSN Entered By: Gretta Cool, BSN, RN, CWS, Kim on 09/05/2020 12:53:01 [name], Janet [name]. ([id number]) -------------------------------------------------------------------------------- Clinic Level of Care Assessment Details Patient Name: Janet [name]. Date of Service: 09/05/2020 12:30 PM Medical Record Number: [id number] Patient Account Number: [id number] Date of Birth/Sex: 01-01-1951 (70 y.o. F) Treating RN: Cornell Barman Primary Care Ahna Konkle: Delight Stare Other Clinician: Referring Alcide Memoli: Delight Stare Treating Dimitris Shanahan/Extender: Skipper Cliche in Treatment: 1 Clinic Level of Care Assessment Items TOOL 3 Quantity Score '[]'$  - Use when EandM and Procedure is performed on FOLLOW-UP visit 0 ASSESSMENTS - Nursing Assessment / Reassessment X - Reassessment of Co-morbidities (includes updates in patient status) 1 10 '[]'$  - 0 Reassessment of Adherence to Treatment Plan ASSESSMENTS - Wound and Skin Assessment / Reassessment '[]'$  - Points for Wound Assessment can only be taken for a new wound of  unknown or different etiology and a 0 procedure is NOT performed to that wound '[]'$  - 0 Simple Wound Assessment / Reassessment - one wound '[]'$  - 0 Complex Wound Assessment / Reassessment - multiple wounds '[]'$  - 0 Dermatologic / Skin Assessment (not related to wound area) ASSESSMENTS - Focused Assessment '[]'$  - Circumferential Edema Measurements - multi extremities 0 '[]'$  - 0 Nutritional Assessment / Counseling / Intervention '[]'$  - 0 Lower Extremity Assessment (monofilament, tuning fork, pulses) '[]'$  - 0 Peripheral Arterial Disease Assessment (using hand held doppler) ASSESSMENTS - Ostomy and/or Continence Assessment and Care '[]'$  - Incontinence Assessment and Management 0 '[]'$  - 0 Ostomy Care Assessment and Management (repouching, etc.) PROCESS - Coordination of Care '[]'$  - Points for Discharge Coordination can only be taken for a new wound of unknown or different etiology and a 0 procedure is NOT performed to that wound '[]'$  - 0 Simple Patient / Family Education for ongoing care '[]'$  - 0 Complex (extensive) Patient / Family Education for ongoing care X- 1 10 Staff obtains Programmer, systems, Records, Test Results / Process Orders '[]'$  - 0 Staff telephones HHA, Nursing Homes / Clarify orders / etc '[]'$  - 0 Routine Transfer to another Facility (non-emergent condition) '[]'$  - 0 Routine Hospital Admission (non-emergent condition) '[]'$  - 0 New Admissions / Biomedical engineer / Ordering NPWT, Apligraf, etc. '[]'$  - 0 Emergency Hospital Admission (emergent condition) X- 1 10 Simple Discharge Coordination '[]'$  - 0 Complex (extensive) Discharge Coordination PROCESS - Special Needs '[]'$  - Pediatric / Minor Patient Management 0 '[]'$  - 0 Isolation Patient Management '[]'$  - 0 Hearing / Language / Visual special needs '[]'$  - 0 Assessment of Community assistance (transportation, D/C planning, etc.) [place], Janet [name]. ([id number]) '[]'$  - 0 Additional assistance / Altered mentation '[]'$  - 0 Support Surface(s) Assessment (bed,  cushion, seat, etc.) INTERVENTIONS - Wound Cleansing / Measurement '[]'$  -  Points for Wound Cleaning / Measurement, Wound Dressing, Specimen Collection and Specimen taken to lab 0 can only be taken for a new wound of unknown or different etiology and a procedure is NOT performed to that wound '[]'$  - 0 Simple Wound Cleansing - one wound '[]'$  - 0 Complex Wound Cleansing - multiple wounds '[]'$  - 0 Wound Imaging (photographs - any number of wounds) '[]'$  - 0 Wound Tracing (instead of photographs) '[]'$  - 0 Simple Wound Measurement - one wound '[]'$  - 0 Complex Wound Measurement - multiple wounds INTERVENTIONS - Wound Dressings '[]'$  - Small Wound Dressing one or multiple wounds 0 '[]'$  - 0 Medium Wound Dressing one or multiple wounds '[]'$  - 0 Large Wound Dressing one or multiple wounds INTERVENTIONS - Miscellaneous '[]'$  - External ear exam 0 '[]'$  - 0 Specimen Collection (cultures, biopsies, blood, body fluids, etc.) '[]'$  - 0 Specimen(s) / Culture(s) sent or taken to Lab for analysis '[]'$  - 0 Patient Transfer (multiple staff / Civil Service fast streamer / Similar devices) '[]'$  - 0 Simple Staple / Suture removal (25 or less) '[]'$  - 0 Complex Staple / Suture removal (26 or more) '[]'$  - 0 Hypo / Hyperglycemic Management (close monitor of Blood Glucose) '[]'$  - 0 Ankle / Brachial Index (ABI) - do not check if billed separately '[]'$  - 0 Vital Signs Has the patient been seen at the hospital within the last three years: Yes Total Score: 30 Level Of Care: New/Established - Level 1 Notes Patient sent to hospital for Right Leg ultrasound to rule out DVT. Electronic Signature(s) Signed: 09/07/2020 10:03:11 AM By: Gretta Cool, BSN, RN, CWS, Kim RN, BSN Entered By: Gretta Cool, BSN, RN, CWS, Kim on 09/05/2020 13:39:54 [name], Janet [name] ([id number]) -------------------------------------------------------------------------------- Encounter Discharge Information Details Patient Name: Janet [name]. Date of Service: 09/05/2020 12:30 PM Medical Record Number:  [id number] Patient Account Number: [id number] Date of Birth/Sex: 1950/08/06 (70 y.o. F) Treating RN: Cornell Barman Primary Care Ciera Beckum: Delight Stare Other Clinician: Referring Tijana Walder: Delight Stare Treating Miken Stecher/Extender: Skipper Cliche in Treatment: 1 Encounter Discharge Information Items Post Procedure Vitals Discharge Condition: Stable Unable to obtain vitals Reason: limited time Ambulatory Status: Ambulatory Discharge Destination: Home Transportation: Private Auto Accompanied By: self Schedule Follow-up Appointment: Yes Clinical Summary of Care: Electronic Signature(s) Signed: 09/07/2020 10:03:11 AM By: Gretta Cool, BSN, RN, CWS, Kim RN, BSN Entered By: Gretta Cool, BSN, RN, CWS, Kim on 09/05/2020 13:41:10 [name], Janet [name] ([id number]) -------------------------------------------------------------------------------- Lower Extremity Assessment Details Patient Name: Janet [name]. Date of Service: 09/05/2020 12:30 PM Medical Record Number: [id number] Patient Account Number: [id number] Date of Birth/Sex: Oct 14, 1950 (70 y.o. F) Treating RN: Cornell Barman Primary Care Giulianna Rocha: Delight Stare Other Clinician: Referring Ilhan Madan: Delight Stare Treating Alianah Lofton/Extender: Skipper Cliche in Treatment: 1 Edema Assessment Assessed: [Left: No] [Right: No] [Left: Edema] [Right: :] Calf Left: Right: Point of Measurement: 32 cm From Medial Instep 40.8 cm Ankle Left: Right: Point of Measurement: 10 cm From Medial Instep 24.5 cm Vascular Assessment Pulses: Dorsalis Pedis Palpable: [Left:Yes] Electronic Signature(s) Signed: 09/07/2020 10:03:11 AM By: Gretta Cool, BSN, RN, CWS, Kim RN, BSN Entered By: Gretta Cool, BSN, RN, CWS, Kim on 09/05/2020 13:05:01 [name], Janet [name]. ([id number]) -------------------------------------------------------------------------------- Multi Wound Chart Details Patient Name: Janet [name]. Date of Service: 09/05/2020 12:30 PM Medical Record Number: [id number] Patient Account Number:  [id number] Date of Birth/Sex: 16-[name]-1950 (70 y.o. F) Treating RN: Cornell Barman Primary Care Yoan Sallade: Delight Stare Other Clinician: Referring Alishia Lebo: Delight Stare Treating Latanja Lehenbauer/Extender: Skipper Cliche in Treatment: 1 Vital Signs Height(in): 66 Pulse(bpm): 74 Weight(lbs):  192 Blood Pressure(mmHg): 148/59 Body Mass Index(BMI): 31 Temperature(F): 98.3 Respiratory Rate(breaths/min): 16 Photos: [N/A:N/A] Wound Location: Left, Distal, Anterior Lower Leg Left, Proximal, Anterior Lower Leg N/A Wounding Event: Blister Blister N/A Primary Etiology: Venous Leg Ulcer Venous Leg Ulcer N/A Secondary Etiology: Lymphedema Lymphedema N/A Comorbid History: Asthma, Hypertension Asthma, Hypertension N/A Date Acquired: 05/15/2020 05/15/2020 N/A Weeks of Treatment: 1 1 N/A Wound Status: Open Open N/A Measurements L x W x D (cm) 4.2x2x0.2 2.5x2x0.2 N/A Area (cm) : 6.597 3.927 N/A Volume (cm) : 1.319 0.785 N/A % Reduction in Area: -10.50% -233.40% N/A % Reduction in Volume: -120.90% -565.30% N/A Classification: Full Thickness Without Exposed Full Thickness Without Exposed N/A Support Structures Support Structures Exudate Amount: Medium Medium N/A Exudate Type: Serosanguineous Serosanguineous N/A Exudate Color: red, brown red, brown N/A Granulation Amount: Small (1-33%) Small (1-33%) N/A Granulation Quality: Red Red N/A Necrotic Amount: Large (67-100%) Large (67-100%) N/A Exposed Structures: Fat Layer (Subcutaneous Tissue): Fat Layer (Subcutaneous Tissue): N/A Yes Yes Fascia: No Fascia: No Tendon: No Tendon: No Muscle: No Muscle: No Joint: No Joint: No Bone: No Bone: No Epithelialization: None None N/A Treatment Notes Electronic Signature(s) Signed: 09/07/2020 10:03:11 AM By: Gretta Cool, BSN, RN, CWS, Kim RN, BSN Entered By: Gretta Cool, BSN, RN, CWS, Kim on 09/05/2020 13:11:23 [name], Janet MMarland Kitchen  ([id number]) -------------------------------------------------------------------------------- Multi-Disciplinary Care Plan Details Patient Name: Janet [name]. Date of Service: 09/05/2020 12:30 PM Medical Record Number: [id number] Patient Account Number: [id number] Date of Birth/Sex: 07-25-1950 (70 y.o. F) Treating RN: Cornell Barman Primary Care Donnalyn Juran: Delight Stare Other Clinician: Referring Trinaty Bundrick: Delight Stare Treating Leniyah Martell/Extender: Skipper Cliche in Treatment: 1 Active Inactive Orientation to the Wound Care Program Nursing Diagnoses: Knowledge deficit related to the wound healing center program Goals: Patient/caregiver will verbalize understanding of the [facility] Program Date Initiated: 08/29/2020 Target Resolution Date: 09/14/2020 Goal Status: Active Interventions: Provide education on orientation to the wound center Notes: Wound/Skin Impairment Nursing Diagnoses: Impaired tissue integrity Knowledge deficit related to smoking impact on wound healing Knowledge deficit related to ulceration/compromised skin integrity Goals: Patient/caregiver will verbalize understanding of skin care regimen Date Initiated: 08/29/2020 Target Resolution Date: 09/14/2020 Goal Status: Active Ulcer/skin breakdown will have a volume reduction of 30% by week 4 Date Initiated: 08/29/2020 Target Resolution Date: 09/29/2020 Goal Status: Active Ulcer/skin breakdown will have a volume reduction of 50% by week 8 Date Initiated: 08/29/2020 Target Resolution Date: 10/29/2020 Goal Status: Active Ulcer/skin breakdown will have a volume reduction of 80% by week 12 Date Initiated: 08/29/2020 Target Resolution Date: 11/29/2020 Goal Status: Active Ulcer/skin breakdown will heal within 14 weeks Date Initiated: 08/29/2020 Target Resolution Date: 12/14/2020 Goal Status: Active Interventions: Assess patient/caregiver ability to obtain necessary supplies Assess patient/caregiver ability to  perform ulcer/skin care regimen upon admission and as needed Assess ulceration(s) every visit Provide education on ulcer and skin care Notes: Electronic Signature(s) Signed: 09/07/2020 10:03:11 AM By: Gretta Cool, BSN, RN, CWS, Kim RN, BSN Entered By: Gretta Cool, BSN, RN, CWS, Kim on 09/05/2020 13:07:20 [name], Janet [name]. ([id number]) -------------------------------------------------------------------------------- Pain Assessment Details Patient Name: Janet [name]. Date of Service: 09/05/2020 12:30 PM Medical Record Number: [id number] Patient Account Number: [id number] Date of Birth/Sex: 21-Feb-1950 (70 y.o. F) Treating RN: Cornell Barman Primary Care Geralene Afshar: Delight Stare Other Clinician: Referring Maymie Brunke: Delight Stare Treating Shalane Florendo/Extender: Skipper Cliche in Treatment: 1 Active Problems Location of Pain Severity and Description of Pain Patient Has Paino Yes Site Locations Pain Location: Pain in Ulcers With Dressing Change: Yes Rate the pain. Current Pain Level: 8  Character of Pain Describe the Pain: Burning, Sharp, Shooting Pain Management and Medication Current Pain Management: Electronic Signature(s) Signed: 09/07/2020 10:03:11 AM By: Gretta Cool, BSN, RN, CWS, Kim RN, BSN Entered By: Gretta Cool, BSN, RN, CWS, Kim on 09/05/2020 12:59:55 [name], Janet [name] ([id number]) -------------------------------------------------------------------------------- Patient/Caregiver Education Details Patient Name: Janet [name]. Date of Service: 09/05/2020 12:30 PM Medical Record Number: [id number] Patient Account Number: [id number] Date of Birth/Gender: 15-Dec-1950 (70 y.o. F) Treating RN: Cornell Barman Primary Care Physician: Delight Stare Other Clinician: Referring Physician: Delight Stare Treating Physician/Extender: Skipper Cliche in Treatment: 1 Education Assessment Education Provided To: Patient Education Topics Provided Wound/Skin Impairment: Handouts: Caring for Your Ulcer Methods: Demonstration,  Explain/Verbal Responses: State content correctly Electronic Signature(s) Signed: 09/07/2020 10:03:11 AM By: Gretta Cool, BSN, RN, CWS, Kim RN, BSN Entered By: Gretta Cool, BSN, RN, CWS, Kim on 09/05/2020 13:40:26 St. [name], [place]. ([id number]) -------------------------------------------------------------------------------- Wound Assessment Details Patient Name: Janet [name]. Date of Service: 09/05/2020 12:30 PM Medical Record Number: [id number] Patient Account Number: [id number] Date of Birth/Sex: 09/15/1950 (70 y.o. F) Treating RN: Cornell Barman Primary Care Menashe Kafer: Delight Stare Other Clinician: Referring Donye Campanelli: Delight Stare Treating Dezyrae Kensinger/Extender: Skipper Cliche in Treatment: 1 Wound Status Wound Number: 1 Primary Etiology: Venous Leg Ulcer Wound Location: Left, Distal, Anterior Lower Leg Secondary Etiology: Lymphedema Wounding Event: Blister Wound Status: Open Date Acquired: 05/15/2020 Comorbid History: Asthma, Hypertension Weeks Of Treatment: 1 Clustered Wound: No Photos Wound Measurements Length: (cm) 4.2 Width: (cm) 2 Depth: (cm) 0.2 Area: (cm) 6.597 Volume: (cm) 1.319 % Reduction in Area: -10.5% % Reduction in Volume: -120.9% Epithelialization: None Tunneling: No Undermining: No Wound Description Classification: Full Thickness Without Exposed Support Structures Exudate Amount: Medium Exudate Type: Serosanguineous Exudate Color: red, brown Foul Odor After Cleansing: No Slough/Fibrino Yes Wound Bed Granulation Amount: Small (1-33%) Exposed Structure Granulation Quality: Red Fascia Exposed: No Necrotic Amount: Large (67-100%) Fat Layer (Subcutaneous Tissue) Exposed: Yes Necrotic Quality: Adherent Slough Tendon Exposed: No Muscle Exposed: No Joint Exposed: No Bone Exposed: No Treatment Notes Wound #1 (Lower Leg) Wound Laterality: Left, Anterior, Distal Cleanser Soap and Water Discharge Instruction: Gently cleanse wound with antibacterial soap, rinse and pat dry  prior to dressing wounds Peri-Wound Care [name], Janet [name]. ([id number]) Topical Primary Dressing Hydrofera Blue Ready Transfer Foam, 4x5 (in/in) Discharge Instruction: Apply Hydrofera Blue Ready to wound bed as directed Secondary Dressing ABD Pad 5x9 (in/in) Discharge Instruction: Cover with ABD pad Secured With Compression Wrap Profore Lite LF 3 Multilayer Compression Bandaging System Discharge Instruction: Apply 3 multi-layer wrap as prescribed. Compression Stockings Environmental education officer) Signed: 09/07/2020 10:03:11 AM By: Gretta Cool, BSN, RN, CWS, Kim RN, BSN Entered By: Gretta Cool, BSN, RN, CWS, Kim on 09/05/2020 13:02:30 Guallpa, Janet [name] MMarland Kitchen ([id number]) -------------------------------------------------------------------------------- Wound Assessment Details Patient Name: Janet [name]. Date of Service: 09/05/2020 12:30 PM Medical Record Number: [id number] Patient Account Number: [id number] Date of Birth/Sex: 1950-03-25 (70 y.o. F) Treating RN: Cornell Barman Primary Care Katiana Ruland: Delight Stare Other Clinician: Referring Cordie Buening: Delight Stare Treating Laketra Bowdish/Extender: Skipper Cliche in Treatment: 1 Wound Status Wound Number: 2 Primary Etiology: Venous Leg Ulcer Wound Location: Left, Proximal, Anterior Lower Leg Secondary Etiology: Lymphedema Wounding Event: Blister Wound Status: Open Date Acquired: 05/15/2020 Comorbid History: Asthma, Hypertension Weeks Of Treatment: 1 Clustered Wound: No Photos Wound Measurements Length: (cm) 2.5 Width: (cm) 2 Depth: (cm) 0.2 Area: (cm) 3.927 Volume: (cm) 0.785 % Reduction in Area: -233.4% % Reduction in Volume: -565.3% Epithelialization: None Tunneling: No Undermining: No Wound Description Classification: Full Thickness Without Exposed Support Structures  Exudate Amount: Medium Exudate Type: Serosanguineous Exudate Color: red, brown Foul Odor After Cleansing: No Slough/Fibrino Yes Wound Bed Granulation Amount: Small (1-33%)  Exposed Structure Granulation Quality: Red Fascia Exposed: No Necrotic Amount: Large (67-100%) Fat Layer (Subcutaneous Tissue) Exposed: Yes Necrotic Quality: Adherent Slough Tendon Exposed: No Muscle Exposed: No Joint Exposed: No Bone Exposed: No Treatment Notes Wound #2 (Lower Leg) Wound Laterality: Left, Anterior, Proximal Cleanser Soap and Water Discharge Instruction: Gently cleanse wound with antibacterial soap, rinse and pat dry prior to dressing wounds Peri-Wound Care [place], Emmani [name]. ([id number]) Topical Primary Dressing Hydrofera Blue Ready Transfer Foam, 4x5 (in/in) Discharge Instruction: Apply Hydrofera Blue Ready to wound bed as directed Secondary Dressing ABD Pad 5x9 (in/in) Discharge Instruction: Cover with ABD pad Secured With Compression Wrap Profore Lite LF 3 Multilayer Compression Bandaging System Discharge Instruction: Apply 3 multi-layer wrap as prescribed. Compression Stockings Environmental education officer) Signed: 09/07/2020 10:03:11 AM By: Gretta Cool, BSN, RN, CWS, Kim RN, BSN Entered By: Gretta Cool, BSN, RN, CWS, Kim on 09/05/2020 13:03:23 Janet [name] ([id number]) -------------------------------------------------------------------------------- Vitals Details Patient Name: Janet [name]. Date of Service: 09/05/2020 12:30 PM Medical Record Number: [id number] Patient Account Number: [id number] Date of Birth/Sex: April 14, 1950 (70 y.o. F) Treating RN: Cornell Barman Primary Care Starlena Beil: Delight Stare Other Clinician: Referring Walaa Carel: Delight Stare Treating Esthefany Herrig/Extender: Skipper Cliche in Treatment: 1 Vital Signs Time Taken: 12:53 Temperature (F): 98.3 Height (in): 66 Pulse (bpm): 74 Weight (lbs): 192 Respiratory Rate (breaths/min): 16 Body Mass Index (BMI): 31 Blood Pressure (mmHg): 148/59 Reference Range: 80 - 120 mg / dl Electronic Signature(s) Signed: 09/07/2020 10:03:11 AM By: Gretta Cool, BSN, RN, CWS, Kim RN, BSN Entered By: Gretta Cool, BSN, RN, CWS, Kim  on 09/05/2020 12:53:23

## 2020-09-08 ENCOUNTER — Other Ambulatory Visit: Payer: Self-pay

## 2020-09-08 DIAGNOSIS — L97822 Non-pressure chronic ulcer of other part of left lower leg with fat layer exposed: Secondary | ICD-10-CM | POA: Diagnosis not present

## 2020-09-09 NOTE — Progress Notes (Signed)
SHANIGUA, DURELL ([id number]) Visit Report for 09/08/2020 Arrival Information Details Patient Name: Janet [name], Janet [name]. Date of Service: 09/08/2020 8:00 AM Medical Record Number: [id number] Patient Account Number: [id number] Date of Birth/Sex: 1950-01-16 (70 y.o. F) Treating RN: Cornell Barman Primary Care Moniqua Engebretsen: Delight Stare Other Clinician: Referring Emmamae Mcnamara: Delight Stare Treating Galen Malkowski/Extender: Skipper Cliche in Treatment: 1 Visit Information History Since Last Visit Has Dressing in Place as Prescribed: Yes Patient Arrived: Ambulatory Has Compression in Place as Prescribed: Yes Arrival Time: 08:13 Pain Present Now: Yes Accompanied By: self Transfer Assistance: None Patient Identification Verified: Yes Secondary Verification Process Completed: Yes Patient Has Alerts: Yes Patient Alerts: Patient on Blood Thinner Aspirin '81mg'$  AV[facility] ABI/TBI 7/22 ABI L- 1.15/ R- 1.08 Electronic Signature(s) Signed: 09/09/2020 4:00:05 PM By: Gretta Cool, BSN, RN, CWS, Kim RN, BSN Entered By: Gretta Cool, BSN, RN, CWS, Kim on 09/08/2020 08:14:12 [place], [place]. ([id number]) -------------------------------------------------------------------------------- Compression Therapy Details Patient Name: Janet [name]. Date of Service: 09/08/2020 8:00 AM Medical Record Number: [id number] Patient Account Number: [id number] Date of Birth/Sex: [birth year] (70 y.o. F) Treating RN: Cornell Barman Primary Care Aziel Morgan: Delight Stare Other Clinician: Referring Keslee Harrington: Delight Stare Treating Markale Birdsell/Extender: Skipper Cliche in Treatment: 1 Compression Therapy Performed for Wound Assessment: Wound #1 Left,Distal,Anterior Lower Leg Performed By: Clinician Cornell Barman, RN Compression Type: Three Layer Pre Treatment ABI: 1.2 Electronic Signature(s) Signed: 09/09/2020 4:00:05 PM By: Gretta Cool, BSN, RN, CWS, Kim RN, BSN Entered By: Gretta Cool, BSN, RN, CWS, Kim on 09/08/2020 08:20:39 Janet [name]  ([id number]) -------------------------------------------------------------------------------- Encounter Discharge Information Details Patient Name: Janet [name]. Date of Service: 09/08/2020 8:00 AM Medical Record Number: [id number] Patient Account Number: [id number] Date of Birth/Sex: [birth year] (70 y.o. F) Treating RN: Cornell Barman Primary Care Carmesha Morocco: Delight Stare Other Clinician: Referring Anntonette Madewell: Delight Stare Treating Karsen Fellows/Extender: Skipper Cliche in Treatment: 1 Encounter Discharge Information Items Discharge Condition: Stable Ambulatory Status: Ambulatory Discharge Destination: Home Transportation: Private Auto Accompanied By: self Schedule Follow-up Appointment: Yes Clinical Summary of Care: Electronic Signature(s) Signed: 09/09/2020 4:00:05 PM By: Gretta Cool, BSN, RN, CWS, Kim RN, BSN Entered By: Gretta Cool, BSN, RN, CWS, Kim on 09/08/2020 [id number] Janet [name] ([id number]) -------------------------------------------------------------------------------- Pain Assessment Details Patient Name: Janet [name]. Date of Service: 09/08/2020 8:00 AM Medical Record Number: [id number] Patient Account Number: [id number] Date of Birth/Sex: [birth year] (70 y.o. F) Treating RN: Cornell Barman Primary Care Brantly Kalman: Delight Stare Other Clinician: Referring Braya Habermehl: Delight Stare Treating Kayia Billinger/Extender: Skipper Cliche in Treatment: 1 Active Problems Location of Pain Severity and Description of Pain Patient Has Paino Yes Site Locations Pain Location: Pain in Ulcers Rate the pain. Current Pain Level: 8 Pain Management and Medication Current Pain Management: Electronic Signature(s) Signed: 09/09/2020 4:00:05 PM By: Gretta Cool, BSN, RN, CWS, Kim RN, BSN Entered By: Gretta Cool, BSN, RN, CWS, Kim on 09/08/2020 08:14:31 [place], [place]. ([id number]) -------------------------------------------------------------------------------- Wound Assessment Details Patient Name: Janet [name]. Date of Service:  09/08/2020 8:00 AM Medical Record Number: [id number] Patient Account Number: [id number] Date of Birth/Sex: 03-09-1950 (70 y.o. F) Treating RN: Cornell Barman Primary Care Toshika Parrow: Delight Stare Other Clinician: Referring Sibel Khurana: Delight Stare Treating Urho Rio/Extender: Skipper Cliche in Treatment: 1 Wound Status Wound Number: 1 Primary Etiology: Venous Leg Ulcer Wound Location: Left, Distal, Anterior Lower Leg Secondary Etiology: Lymphedema Wounding Event: Blister Wound Status: Open Date Acquired: 05/15/2020 Weeks Of Treatment: 1 Clustered Wound: No Photos Photo Uploaded By: Gretta Cool, BSN, RN, CWS, Kim on 09/08/2020 08:33:34 Wound Measurements Length: (cm) 4.2 Width: (cm) 2 Depth: (cm) 0.2 Area: (cm)  6.597 Volume: (cm) 1.319 % Reduction in Area: -10.5% % Reduction in Volume: -120.9% Wound Description Classification: Full Thickness Without Exposed Support Structu Exudate Amount: Medium Exudate Type: Serosanguineous Exudate Color: red, brown res Treatment Notes Wound #1 (Lower Leg) Wound Laterality: Left, Anterior, Distal Cleanser Soap and Water Discharge Instruction: Gently cleanse wound with antibacterial soap, rinse and pat dry prior to dressing wounds Peri-Wound Care Topical Primary Dressing Hydrofera Blue Ready Transfer Foam, 4x5 (in/in) Discharge Instruction: Apply Hydrofera Blue Ready to wound bed as directed adaptic Secondary Dressing ABD Pad 5x9 (in/in) Starnes, Decie M. ([id number]) Discharge Instruction: Cover with ABD pad Secured With Compression Wrap Profore Lite LF 3 Multilayer Compression Bandaging System Discharge Instruction: Apply 3 multi-layer wrap as prescribed. Compression Stockings Environmental education officer) Signed: 09/09/2020 4:00:05 PM By: Gretta Cool, BSN, RN, CWS, Kim RN, BSN Entered By: Gretta Cool, BSN, RN, CWS, Kim on 09/08/2020 08:20:16 Bebo, [name]  ([id number]) -------------------------------------------------------------------------------- Wound Assessment Details Patient Name: Janet [name], Janet [name]. Date of Service: 09/08/2020 8:00 AM Medical Record Number: [id number] Patient Account Number: [id number] Date of Birth/Sex: [birth year] (70 y.o. F) Treating RN: Cornell Barman Primary Care Jayleon Mcfarlane: Delight Stare Other Clinician: Referring Tatia Petrucci: Delight Stare Treating Tayra Dawe/Extender: Skipper Cliche in Treatment: 1 Wound Status Wound Number: 2 Primary Etiology: Venous Leg Ulcer Wound Location: Left, Proximal, Anterior Lower Leg Secondary Etiology: Lymphedema Wounding Event: Blister Wound Status: Open Date Acquired: 05/15/2020 Weeks Of Treatment: 1 Clustered Wound: No Photos Photo Uploaded By: Gretta Cool, BSN, RN, CWS, Kim on 09/08/2020 08:33:35 Wound Measurements Length: (cm) 2.5 Width: (cm) 2 Depth: (cm) 0.2 Area: (cm) 3.927 Volume: (cm) 0.785 % Reduction in Area: -233.4% % Reduction in Volume: -565.3% Wound Description Classification: Full Thickness Without Exposed Support Structu Exudate Amount: Medium Exudate Type: Serosanguineous Exudate Color: red, brown res Treatment Notes Wound #2 (Lower Leg) Wound Laterality: Left, Anterior, Proximal Cleanser Soap and Water Discharge Instruction: Gently cleanse wound with antibacterial soap, rinse and pat dry prior to dressing wounds Peri-Wound Care Topical Primary Dressing Hydrofera Blue Ready Transfer Foam, 4x5 (in/in) Discharge Instruction: Apply Hydrofera Blue Ready to wound bed as directed Secondary Dressing ABD Pad 5x9 (in/in) Kettering, Genora M. ([id number]) Discharge Instruction: Cover with ABD pad Secured With Compression Wrap Profore Lite LF 3 Multilayer Compression Bandaging System Discharge Instruction: Apply 3 multi-layer wrap as prescribed. Compression Stockings Environmental education officer) Signed: 09/09/2020 4:00:05 PM By: Gretta Cool, BSN, RN, CWS, Kim RN, BSN Entered  By: Gretta Cool, BSN, RN, CWS, Kim on 09/08/2020 08:20:16

## 2020-09-12 ENCOUNTER — Encounter: Payer: Medicare Other | Admitting: Physician Assistant

## 2020-09-12 ENCOUNTER — Other Ambulatory Visit: Payer: Self-pay

## 2020-09-12 DIAGNOSIS — L97822 Non-pressure chronic ulcer of other part of left lower leg with fat layer exposed: Secondary | ICD-10-CM | POA: Diagnosis not present

## 2020-09-12 NOTE — Progress Notes (Signed)
LIZVETTE, CARMICKLE ([id number]) Visit Report for 09/12/2020 Arrival Information Details Patient Name: TAYELOR, FABIEN. Date of Service: 09/12/2020 8:45 AM Medical Record Number: [id number] Patient Account Number: [id number] Date of Birth/Sex: 10/04/1950 (70 y.o. F) Treating RN: Donnamarie Poag Primary Care Kacee Sukhu: Delight Stare Other Clinician: Referring Shahzain Kiester: Delight Stare Treating Anjalee Cope/Extender: Skipper Cliche in Treatment: 2 Visit Information History Since Last Visit Added or deleted any medications: No Patient Arrived: Ambulatory Had a fall or experienced change in No Arrival Time: 08:50 activities of daily living that may affect Accompanied By: self risk of falls: Transfer Assistance: None Hospitalized since last visit: No Patient Identification Verified: Yes Has Dressing in Place as Prescribed: Yes Secondary Verification Process Completed: Yes Has Compression in Place as Prescribed: Yes Patient Has Alerts: Yes Pain Present Now: Yes Patient Alerts: Patient on Blood Thinner Aspirin '81mg'$  AVVS ABI/TBI 7/22 ABI L- 1.15/ R- 1.08 Electronic Signature(s) Signed: 09/12/2020 9:42:50 AM By: Donnamarie Poag Entered By: Donnamarie Poag on 09/12/2020 08:50:51 Ennis, Zari M. ([id number]) -------------------------------------------------------------------------------- Clinic Level of Care Assessment Details Patient Name: [name] Date of Service: 09/12/2020 8:45 AM Medical Record Number: [id number] Patient Account Number: [id number] Date of Birth/Sex: 1950/04/22 (70 y.o. F) Treating RN: Donnamarie Poag Primary Care Kendallyn Lippold: Delight Stare Other Clinician: Referring Ron Junco: Delight Stare Treating Ronrico Dupin/Extender: Skipper Cliche in Treatment: 2 Clinic Level of Care Assessment Items TOOL 1 Quantity Score '[]'$  - Use when EandM and Procedure is performed on INITIAL visit 0 ASSESSMENTS - Nursing Assessment / Reassessment '[]'$  - General Physical Exam (combine w/ comprehensive assessment (listed  just below) when performed on new 0 pt. evals) '[]'$  - 0 Comprehensive Assessment (HX, ROS, Risk Assessments, Wounds Hx, etc.) ASSESSMENTS - Wound and Skin Assessment / Reassessment '[]'$  - Dermatologic / Skin Assessment (not related to wound area) 0 ASSESSMENTS - Ostomy and/or Continence Assessment and Care '[]'$  - Incontinence Assessment and Management 0 '[]'$  - 0 Ostomy Care Assessment and Management (repouching, etc.) PROCESS - Coordination of Care '[]'$  - Simple Patient / Family Education for ongoing care 0 '[]'$  - 0 Complex (extensive) Patient / Family Education for ongoing care '[]'$  - 0 Staff obtains Programmer, systems, Records, Test Results / Process Orders '[]'$  - 0 Staff telephones HHA, Nursing Homes / Clarify orders / etc '[]'$  - 0 Routine Transfer to another Facility (non-emergent condition) '[]'$  - 0 Routine Hospital Admission (non-emergent condition) '[]'$  - 0 New Admissions / Biomedical engineer / Ordering NPWT, Apligraf, etc. '[]'$  - 0 Emergency Hospital Admission (emergent condition) PROCESS - Special Needs '[]'$  - Pediatric / Minor Patient Management 0 '[]'$  - 0 Isolation Patient Management '[]'$  - 0 Hearing / Language / Visual special needs '[]'$  - 0 Assessment of Community assistance (transportation, D/C planning, etc.) '[]'$  - 0 Additional assistance / Altered mentation '[]'$  - 0 Support Surface(s) Assessment (bed, cushion, seat, etc.) INTERVENTIONS - Miscellaneous '[]'$  - External ear exam 0 '[]'$  - 0 Patient Transfer (multiple staff / Civil Service fast streamer / Similar devices) '[]'$  - 0 Simple Staple / Suture removal (25 or less) '[]'$  - 0 Complex Staple / Suture removal (26 or more) '[]'$  - 0 Hypo/Hyperglycemic Management (do not check if billed separately) '[]'$  - 0 Ankle / Brachial Index (ABI) - do not check if billed separately Has the patient been seen at the hospital within the last three years: Yes Total Score: 0 Level Of Care: ____ [name] ([id number]) Electronic Signature(s) Signed: 09/12/2020 9:42:50 AM By:  Donnamarie Poag Entered By: Donnamarie Poag on 09/12/2020 09:20:45 Cooprider, Vicci M. (  [id number]) -------------------------------------------------------------------------------- Encounter Discharge Information Details Patient Name: KARRINGTON, TURBYFILL. Date of Service: 09/12/2020 8:45 AM Medical Record Number: [id number] Patient Account Number: [id number] Date of Birth/Sex: [birth year] (70 y.o. F) Treating RN: Donnamarie Poag Primary Care Bay Jarquin: Delight Stare Other Clinician: Referring Zira Helinski: Delight Stare Treating Rama Sorci/Extender: Skipper Cliche in Treatment: 2 Encounter Discharge Information Items Post Procedure Vitals Discharge Condition: Stable Temperature (F): 98.7 Ambulatory Status: Ambulatory Pulse (bpm): 73 Discharge Destination: Home Respiratory Rate (breaths/min): 16 Transportation: Private Auto Blood Pressure (mmHg): 130/74 Accompanied By: self Schedule Follow-up Appointment: Yes Clinical Summary of Care: Electronic Signature(s) Signed: 09/12/2020 9:42:50 AM By: Donnamarie Poag Entered By: Donnamarie Poag on 09/12/2020 09:25:05 Bodley, Jinna M. ([id number]) -------------------------------------------------------------------------------- Lower Extremity Assessment Details Patient Name: [name] Date of Service: 09/12/2020 8:45 AM Medical Record Number: [id number] Patient Account Number: [id number] Date of Birth/Sex: Sep 25, 1950 (70 y.o. F) Treating RN: Donnamarie Poag Primary Care Trystan Akhtar: Delight Stare Other Clinician: Referring Audryanna Zurita: Delight Stare Treating Natsha Guidry/Extender: Skipper Cliche in Treatment: 2 Edema Assessment Assessed: [Left: Yes] [Right: No] Edema: [Left: N] [Right: o] Calf Left: Right: Point of Measurement: 32 cm From Medial Instep 41 cm Ankle Left: Right: Point of Measurement: 10 cm From Medial Instep 24.5 cm Knee To Floor Left: Right: From Medial Instep 41 cm Vascular Assessment Pulses: Dorsalis Pedis Palpable: [Left:Yes] Electronic Signature(s) Signed:  09/12/2020 9:42:50 AM By: Donnamarie Poag Entered By: Donnamarie Poag on 09/12/2020 09:05:43 Jaquay, Aminah M. ([id number]) -------------------------------------------------------------------------------- Multi Wound Chart Details Patient Name: [name] Date of Service: 09/12/2020 8:45 AM Medical Record Number: [id number] Patient Account Number: [id number] Date of Birth/Sex: 05-02-1950 (70 y.o. F) Treating RN: Donnamarie Poag Primary Care Gaylan Fauver: Delight Stare Other Clinician: Referring Danyele Smejkal: Delight Stare Treating Lizvette Lightsey/Extender: Skipper Cliche in Treatment: 2 Vital Signs Height(in): 59 Pulse(bpm): 90 Weight(lbs): 192 Blood Pressure(mmHg): 130/74 Body Mass Index(BMI): 31 Temperature(F): 98.7 Respiratory Rate(breaths/min): 16 Photos: [N/A:N/A] Wound Location: Left, Distal, Anterior Lower Leg Left, Proximal, Anterior Lower Leg N/A Wounding Event: Blister Blister N/A Primary Etiology: Venous Leg Ulcer Venous Leg Ulcer N/A Secondary Etiology: Lymphedema Lymphedema N/A Comorbid History: Asthma, Hypertension Asthma, Hypertension N/A Date Acquired: 05/15/2020 05/15/2020 N/A Weeks of Treatment: 2 2 N/A Wound Status: Open Open N/A Measurements L x W x D (cm) 3.8x2x0.1 2x1.7x0.1 N/A Area (cm) : 5.969 2.67 N/A Volume (cm) : 0.597 0.267 N/A % Reduction in Area: 0.00% -126.70% N/A % Reduction in Volume: 0.00% -126.30% N/A Classification: Full Thickness Without Exposed Full Thickness Without Exposed N/A Support Structures Support Structures Exudate Amount: Medium Medium N/A Exudate Type: Serosanguineous Serosanguineous N/A Exudate Color: red, brown red, brown N/A Granulation Amount: Medium (34-66%) Medium (34-66%) N/A Granulation Quality: Red, Pink, Hyper-granulation Red, Pink, Hyper-granulation N/A Necrotic Amount: Medium (34-66%) Medium (34-66%) N/A Exposed Structures: Fat Layer (Subcutaneous Tissue): N/A N/A Yes Fascia: No Tendon: No Muscle: No Joint: No Bone: No Debridement:  Debridement - Excisional Debridement - Excisional N/A Pre-procedure Verification/Time 09:10 09:10 N/A Out Taken: Pain Control: Lidocaine 4% Topical Solution Lidocaine 4% Topical Solution N/A Tissue Debrided: Callus, Subcutaneous, Slough Callus, Subcutaneous, Slough N/A Level: Skin/Subcutaneous Tissue Skin/Subcutaneous Tissue N/A Debridement Area (sq cm): 7.6 3.4 N/A Instrument: Curette Curette N/A Bleeding: Minimum Minimum N/A Hemostasis Achieved: Pressure Pressure N/A Debridement Treatment Procedure was tolerated well Procedure was tolerated well N/A Response: Post Debridement 3.8x2x0.1 2x1.7x0.1 N/A Measurements L x W x D (cm) Eccleston, Janeah M. ([id number]) Post Debridement Volume: 0.597 0.267 N/A (cm) Procedures Performed: Debridement Debridement N/A Treatment Notes Electronic Signature(s) Signed: 09/12/2020 9:42:50 AM By:  Bishop, Joy Entered By: Donnamarie Poag on 09/12/2020 09:17:08 [place], [place]. ([id number]) -------------------------------------------------------------------------------- Multi-Disciplinary Care Plan Details Patient Name: LTANYA, PARRIE. Date of Service: 09/12/2020 8:45 AM Medical Record Number: [id number] Patient Account Number: [id number] Date of Birth/Sex: 08-Dec-1950 (70 y.o. F) Treating RN: Donnamarie Poag Primary Care Zuriel Roskos: Delight Stare Other Clinician: Referring Zenaida Tesar: Delight Stare Treating Farris Geiman/Extender: Skipper Cliche in Treatment: 2 Active Inactive Wound/Skin Impairment Nursing Diagnoses: Impaired tissue integrity Knowledge deficit related to smoking impact on wound healing Knowledge deficit related to ulceration/compromised skin integrity Goals: Patient/caregiver will verbalize understanding of skin care regimen Date Initiated: 08/29/2020 Target Resolution Date: 09/14/2020 Goal Status: Active Ulcer/skin breakdown will have a volume reduction of 30% by week 4 Date Initiated: 08/29/2020 Target Resolution Date: 09/29/2020 Goal Status:  Active Ulcer/skin breakdown will have a volume reduction of 50% by week 8 Date Initiated: 08/29/2020 Target Resolution Date: 10/29/2020 Goal Status: Active Ulcer/skin breakdown will have a volume reduction of 80% by week 12 Date Initiated: 08/29/2020 Target Resolution Date: 11/29/2020 Goal Status: Active Ulcer/skin breakdown will heal within 14 weeks Date Initiated: 08/29/2020 Target Resolution Date: 12/14/2020 Goal Status: Active Interventions: Assess patient/caregiver ability to obtain necessary supplies Assess patient/caregiver ability to perform ulcer/skin care regimen upon admission and as needed Assess ulceration(s) every visit Provide education on ulcer and skin care Notes: Electronic Signature(s) Signed: 09/12/2020 9:42:50 AM By: Donnamarie Poag Entered By: Donnamarie Poag on 09/12/2020 09:16:58 Peri, Madysun M. ([id number]) -------------------------------------------------------------------------------- Pain Assessment Details Patient Name: [name] Date of Service: 09/12/2020 8:45 AM Medical Record Number: [id number] Patient Account Number: [id number] Date of Birth/Sex: [birth year] (70 y.o. F) Treating RN: Donnamarie Poag Primary Care Oluwadarasimi Redmon: Delight Stare Other Clinician: Referring Jamil Castillo: Delight Stare Treating Shirlette Scarber/Extender: Skipper Cliche in Treatment: 2 Active Problems Location of Pain Severity and Description of Pain Patient Has Paino Yes Site Locations Pain Location: Pain in Ulcers Rate the pain. Current Pain Level: 7 Pain Management and Medication Current Pain Management: Electronic Signature(s) Signed: 09/12/2020 9:42:50 AM By: Donnamarie Poag Entered By: Donnamarie Poag on 09/12/2020 08:53:23 Brusca, Mintie M. ([id number]) -------------------------------------------------------------------------------- Patient/Caregiver Education Details Patient Name: [name] Date of Service: 09/12/2020 8:45 AM Medical Record Number: [id number] Patient Account Number:  [id number] Date of Birth/Gender: 1950/10/10 (70 y.o. F) Treating RN: Donnamarie Poag Primary Care Physician: Delight Stare Other Clinician: Referring Physician: Delight Stare Treating Physician/Extender: Skipper Cliche in Treatment: 2 Education Assessment Education Provided To: Patient Education Topics Provided Basic Hygiene: Wound Debridement: Wound/Skin Impairment: Electronic Signature(s) Signed: 09/12/2020 9:42:50 AM By: Donnamarie Poag Entered By: Donnamarie Poag on 09/12/2020 09:22:53 Valenti, Genisis M. ([id number]) -------------------------------------------------------------------------------- Wound Assessment Details Patient Name: [name] Date of Service: 09/12/2020 8:45 AM Medical Record Number: [id number] Patient Account Number: [id number] Date of Birth/Sex: [birth year] (70 y.o. F) Treating RN: Donnamarie Poag Primary Care Jayse Hodkinson: Delight Stare Other Clinician: Referring Kelven Flater: Delight Stare Treating Julius Matus/Extender: Skipper Cliche in Treatment: 2 Wound Status Wound Number: 1 Primary Etiology: Venous Leg Ulcer Wound Location: Left, Distal, Anterior Lower Leg Secondary Etiology: Lymphedema Wounding Event: Blister Wound Status: Open Date Acquired: 05/15/2020 Comorbid History: Asthma, Hypertension Weeks Of Treatment: 2 Clustered Wound: No Photos Wound Measurements Length: (cm) 3.8 Width: (cm) 2 Depth: (cm) 0.1 Area: (cm) 5.969 Volume: (cm) 0.597 % Reduction in Area: 0% % Reduction in Volume: 0% Tunneling: No Undermining: No Wound Description Classification: Full Thickness Without Exposed Support Structures Exudate Amount: Medium Exudate Type: Serosanguineous Exudate Color: red, brown Foul Odor After Cleansing: No Slough/Fibrino Yes Wound Bed Granulation Amount:  Medium (34-66%) Exposed Structure Granulation Quality: Red, Pink, Hyper-granulation Fascia Exposed: No Necrotic Amount: Medium (34-66%) Fat Layer (Subcutaneous Tissue) Exposed: Yes Necrotic Quality:  Adherent Slough Tendon Exposed: No Muscle Exposed: No Joint Exposed: No Bone Exposed: No Treatment Notes Wound #1 (Lower Leg) Wound Laterality: Left, Anterior, Distal Cleanser Soap and Water Discharge Instruction: Gently cleanse wound with antibacterial soap, rinse and pat dry prior to dressing wounds Wound Cleanser Discharge Instruction: Wash your hands with soap and water. Remove old dressing, discard into plastic bag and place into trash. Cleanse the wound with Wound Cleanser prior to applying a clean dressing using gauze sponges, not tissues or cotton balls. Do not Nemes, Amylah M. ([id number]) scrub or use excessive force. Pat dry using gauze sponges, not tissue or cotton balls. Peri-Wound Care Topical Primary Dressing Hydrofera Blue Ready Transfer Foam, 4x5 (in/in) Discharge Instruction: CUT TO FIT-Apply Hydrofera Blue Ready to wound bed as directed Secondary Dressing Zetuvit Plus Silicone Border Dressing 5x5 (in/in) Discharge Instruction: [place] With Compression Wrap Compression Stockings Add-Ons Electronic Signature(s) Signed: 09/12/2020 9:42:50 AM By: Donnamarie Poag Entered By: Donnamarie Poag on 09/12/2020 08:58:03 Bunte, Julyanna M. ([id number]) -------------------------------------------------------------------------------- Wound Assessment Details Patient Name: [name] Date of Service: 09/12/2020 8:45 AM Medical Record Number: [id number] Patient Account Number: [id number] Date of Birth/Sex: [birth year] (70 y.o. F) Treating RN: Donnamarie Poag Primary Care Kamaria Lucia: Delight Stare Other Clinician: Referring Romone Shaff: Delight Stare Treating Auther Lyerly/Extender: Skipper Cliche in Treatment: 2 Wound Status Wound Number: 2 Primary Etiology: Venous Leg Ulcer Wound Location: Left, Proximal, Anterior Lower Leg Secondary Etiology: Lymphedema Wounding Event: Blister Wound Status: Open Date Acquired: 05/15/2020 Comorbid History: Asthma,  Hypertension Weeks Of Treatment: 2 Clustered Wound: No Photos Wound Measurements Length: (cm) 2 Width: (cm) 1.7 Depth: (cm) 0.1 Area: (cm) 2.67 Volume: (cm) 0.267 % Reduction in Area: -126.7% % Reduction in Volume: -126.3% Tunneling: No Undermining: No Wound Description Classification: Full Thickness Without Exposed Support Structures Exudate Amount: Medium Exudate Type: Serosanguineous Exudate Color: red, brown Foul Odor After Cleansing: No Slough/Fibrino Yes Wound Bed Granulation Amount: Medium (34-66%) Granulation Quality: Red, Pink, Hyper-granulation Necrotic Amount: Medium (34-66%) Necrotic Quality: Adherent Slough Treatment Notes Wound #2 (Lower Leg) Wound Laterality: Left, Anterior, Proximal Cleanser Soap and Water Discharge Instruction: Gently cleanse wound with antibacterial soap, rinse and pat dry prior to dressing wounds Peri-Wound Care Topical Primary Dressing ALVENA, RIANO ([id number]) Hydrofera Blue Ready Transfer Foam, 4x5 (in/in) Discharge Instruction: Apply Hydrofera Blue Ready to wound bed as directed Secondary Dressing Zetuvit Plus Silicone Border Dressing 5x5 (in/in) Secured With Compression Wrap Compression Stockings Circaid Juxta Lite Compression Wrap Quantity: 1 Left Leg Compression Amount: 30-40 mmHg Discharge Instruction: Apply Circaid Juxta Lite Compression Wrap as directed Add-Ons Electronic Signature(s) Signed: 09/12/2020 9:42:50 AM By: Donnamarie Poag Entered By: Donnamarie Poag on 09/12/2020 08:58:47 Spoon, Antoine M. ([id number]) -------------------------------------------------------------------------------- Vitals Details Patient Name: [name] Date of Service: 09/12/2020 8:45 AM Medical Record Number: [id number] Patient Account Number: [id number] Date of Birth/Sex: 13-Apr-1950 (70 y.o. F) Treating RN: Donnamarie Poag Primary Care Ladarrell Cornwall: Delight Stare Other Clinician: Referring Casmer Yepiz: Delight Stare Treating Hanson Medeiros/Extender: Skipper Cliche in Treatment: 2 Vital Signs Time Taken: 08:51 Temperature (F): 98.7 Height (in): 66 Pulse (bpm): 73 Weight (lbs): 192 Respiratory Rate (breaths/min): 16 Body Mass Index (BMI): 31 Blood Pressure (mmHg): 130/74 Reference Range: 80 - 120 mg / dl Electronic Signature(s) Signed: 09/12/2020 9:42:50 AM By: Donnamarie Poag Entered ByDonnamarie Poag on 09/12/2020 08:53:13

## 2020-09-12 NOTE — Progress Notes (Addendum)
EVALET, LUTTRULL ([id number]) Visit Report for 09/12/2020 Chief Complaint Document Details Patient Name: Janet [name], Janet [name]. Date of Service: 09/12/2020 8:45 AM Medical Record Number: [id number] Patient Account Number: [id number] Date of Birth/Sex: 12/18/1950 (70 y.o. F) Treating RN: Donnamarie Poag Primary Care Provider: Delight Stare Other Clinician: Referring Provider: Delight Stare Treating Provider/Extender: Skipper Cliche in Treatment: 2 Information Obtained from: Patient Chief Complaint Left LE Ulcers Electronic Signature(s) Signed: 09/12/2020 9:01:22 AM By: Worthy Keeler PA-C Entered By: Worthy Keeler on 09/12/2020 09:01:22 [place], [place] ([id number]) -------------------------------------------------------------------------------- Debridement Details Patient Name: Janet [name]. Date of Service: 09/12/2020 8:45 AM Medical Record Number: [id number] Patient Account Number: [id number] Date of Birth/Sex: [birth year] (70 y.o. F) Treating RN: Donnamarie Poag Primary Care Provider: Delight Stare Other Clinician: Referring Provider: Delight Stare Treating Provider/Extender: Skipper Cliche in Treatment: 2 Debridement Performed for Wound #1 Left,Distal,Anterior Lower Leg Assessment: Performed By: Physician Tommie Sams., PA-C Debridement Type: Debridement Severity of Tissue Pre Debridement: Fat layer exposed Level of Consciousness (Pre- Awake and Alert procedure): Pre-procedure Verification/Time Out Yes - 09:10 Taken: Start Time: 09:10 Pain Control: Lidocaine 4% Topical Solution Total Area Debrided (L x W): 3.8 (cm) x 2 (cm) = 7.6 (cm) Tissue and other material Viable, Callus, Slough, Subcutaneous, Biofilm, Slough debrided: Level: Skin/Subcutaneous Tissue Debridement Description: Excisional Instrument: Curette Bleeding: Minimum Hemostasis Achieved: Pressure Response to Treatment: Procedure was tolerated well Level of Consciousness (Post- Awake and Alert procedure): Post  Debridement Measurements of Total Wound Length: (cm) 3.8 Width: (cm) 2 Depth: (cm) 0.1 Volume: (cm) 0.597 Character of Wound/Ulcer Post Debridement: Improved Severity of Tissue Post Debridement: Fat layer exposed Post Procedure Diagnosis Same as Pre-procedure Electronic Signature(s) Signed: 09/12/2020 9:42:50 AM By: Donnamarie Poag Signed: 09/12/2020 5:05:16 PM By: Worthy Keeler PA-C Entered By: Donnamarie Poag on 09/12/2020 09:15:08 Janet [name], [name] ([id number]) -------------------------------------------------------------------------------- Debridement Details Patient Name: Janet [name]. Date of Service: 09/12/2020 8:45 AM Medical Record Number: [id number] Patient Account Number: [id number] Date of Birth/Sex: 10-04-1950 (70 y.o. F) Treating RN: Donnamarie Poag Primary Care Provider: Delight Stare Other Clinician: Referring Provider: Delight Stare Treating Provider/Extender: Skipper Cliche in Treatment: 2 Debridement Performed for Wound #2 Left,Proximal,Anterior Lower Leg Assessment: Performed By: Physician Tommie Sams., PA-C Debridement Type: Debridement Severity of Tissue Pre Debridement: Fat layer exposed Level of Consciousness (Pre- Awake and Alert procedure): Pre-procedure Verification/Time Out Yes - 09:10 Taken: Start Time: 09:13 Pain Control: Lidocaine 4% Topical Solution Total Area Debrided (L x W): 2 (cm) x 1.7 (cm) = 3.4 (cm) Tissue and other material Viable, Callus, Slough, Subcutaneous, Biofilm, Slough debrided: Level: Skin/Subcutaneous Tissue Debridement Description: Excisional Instrument: Curette Bleeding: Minimum Hemostasis Achieved: Pressure Response to Treatment: Procedure was tolerated well Level of Consciousness (Post- Awake and Alert procedure): Post Debridement Measurements of Total Wound Length: (cm) 2 Width: (cm) 1.7 Depth: (cm) 0.1 Volume: (cm) 0.267 Character of Wound/Ulcer Post Debridement: Improved Severity of Tissue Post Debridement: Fat  layer exposed Post Procedure Diagnosis Same as Pre-procedure Electronic Signature(s) Signed: 09/12/2020 9:42:50 AM By: Donnamarie Poag Signed: 09/12/2020 5:05:16 PM By: Worthy Keeler PA-C Entered By: Donnamarie Poag on 09/12/2020 09:15:57 Janet [name], [name] ([id number]) -------------------------------------------------------------------------------- HPI Details Patient Name: Janet [name]. Date of Service: 09/12/2020 8:45 AM Medical Record Number: [id number] Patient Account Number: [id number] Date of Birth/Sex: 26-[name]-1950 (70 y.o. F) Treating RN: Donnamarie Poag Primary Care Provider: Delight Stare Other Clinician: Referring Provider: Delight Stare Treating Provider/Extender: Skipper Cliche in Treatment: 2 History of Present Illness HPI Description: 08/29/2020  upon evaluation today patient appears to be doing somewhat poorly in regard to what appeared to be venous leg ulcers on the left leg that she has been dealing with since around the beginning of May. Fortunately she does not have any signs of obvious infection unfortunately she does have quite a bit of necrotic tissue that does not really need to be debrided. She has been seeing vascular and they did wrap her for a time with what was likely an Haematologist. With that being said the [facility] boot has given her some trouble as far as feeling claustrophobic was concerned she is not sure if she is good to be able to tolerate a compression wrap currently although we will get a try she is in agreement with Aleve given this shot. Fortunately I do not see any signs of active infection systemically which is great news and even locally she seems to be doing quite well today. Patient's Right ABI is 1.08 with a TBI of 0.79 and the left ABI is 1.15 with a TBI of 0.84 on 08/03/20. She also has a history of chronic venous insufficiency and lymphedema but otherwise no major medical problems. 09/05/2020 upon evaluation today patient unfortunately appears to be having some  issues today with her right leg this is completely different from what we have been taking care of on the left. She actually has some swelling and an area of erythema and pain which is in the medial calf region. There is no identifiable abscess definitely this could just be cellulitis but with her history of DVT I think that we need to have this checked out. 09/12/2020 upon evaluation today patient appears to be doing better in regard to her wound. She has been tolerating the dressing changes without complication. Fortunately there does not appear to be any signs of active infection which is great news and overall I think that the patient is doing well in this regard. Her wounds are measuring smaller at both locations and the surface of the wound is looking better. With that being said she does have a wedding that she is supposed to be going to this weekend and states that she really needs to not have the wrap on for that. Nonetheless regular and discuss options to try to help maintain while she has this time with the wedding. Electronic Signature(s) Signed: 09/12/2020 9:55:30 AM By: Worthy Keeler PA-C Entered By: Worthy Keeler on 09/12/2020 09:55:29 Janet [name], [name] ([id number]) -------------------------------------------------------------------------------- Physical Exam Details Patient Name: Janet [name]. Date of Service: 09/12/2020 8:45 AM Medical Record Number: [id number] Patient Account Number: [id number] Date of Birth/Sex: [birth year] (70 y.o. F) Treating RN: Donnamarie Poag Primary Care Provider: Delight Stare Other Clinician: Referring Provider: Delight Stare Treating Provider/Extender: Skipper Cliche in Treatment: 2 Constitutional Well-nourished and well-hydrated in no acute distress. Respiratory normal breathing without difficulty. Psychiatric this patient is able to make decisions and demonstrates good insight into disease process. Alert and Oriented x 3. pleasant and  cooperative. Notes Upon inspection patient's wound bed actually showed signs of good granulation epithelization at this point. Fortunately there does not appear to be any signs of active infection which is great news and overall very pleased with where things stand today. No fevers, chills, nausea, vomiting, or diarrhea. Electronic Signature(s) Signed: 09/12/2020 9:55:56 AM By: Worthy Keeler PA-C Entered By: Worthy Keeler on 09/12/2020 09:55:56 Janet [name], [name] ([id number]) -------------------------------------------------------------------------------- Physician Orders Details Patient Name: Janet [name]. Date of Service: 09/12/2020 8:45 AM  Medical Record Number: [id number] Patient Account Number: [id number] Date of Birth/Sex: 11/30/1950 (70 y.o. F) Treating RN: Donnamarie Poag Primary Care Provider: Delight Stare Other Clinician: Referring Provider: Delight Stare Treating Provider/Extender: Skipper Cliche in Treatment: 2 Verbal / Phone Orders: No Diagnosis Coding ICD-10 Coding Code Description I87.2 Venous insufficiency (chronic) (peripheral) I89.0 Lymphedema, not elsewhere classified 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 of other part of left lower leg with fat layer exposed L03.115 Cellulitis of right lower limb R22.41 Localized swelling, mass and lump, right lower limb Follow-up Appointments o Return Appointment in 1 week. o Nurse Visit as needed Bathing/ Shower/ Hygiene o May shower with wound dressing protected with water repellent cover or cast protector. o No tub bath. Edema Control - Lymphedema / Segmental Compressive Device / Other o Optional: One layer of unna paste to top of compression wrap (to act as an anchor). - left leg o Patient to wear own compression stockings. Remove compression stockings every night before going to bed and put on every morning when getting up. - right leg-see Elastic Therapy contact and measurements o Patient to wear own Velcro  compression garment. Remove compression stockings every night before going to bed and put on every morning when getting up. - will be mailed to you, "JUXTELITE" o Elevate legs to the level of the heart and pump ankles as often as possible o Elevate leg(s) parallel to the floor when sitting. o DO YOUR BEST to sleep in the bed at night. DO NOT sleep in your recliner. Long hours of sitting in a recliner leads to swelling of the legs and/or potential wounds on your backside. Medications-Please add to medication list. o Take one '500mg'$  Tylenol (Acetaminophen) and one '200mg'$  Motrin (Ibuprofen) every 6 hours for pain. Do not take ibuprofen if you are on blood thinners or have stomach ulcers. - as needed for pain if not contraindicated by other physician Wound Treatment Wound #1 - Lower Leg Wound Laterality: Left, Anterior, Distal Cleanser: Soap and Water 3 x Per Week/30 Days Discharge Instructions: Gently cleanse wound with antibacterial soap, rinse and pat dry prior to dressing wounds Cleanser: Wound Cleanser (DME) (Generic) 3 x Per Week/30 Days Discharge Instructions: Wash your hands with soap and water. Remove old dressing, discard into plastic bag and place into trash. Cleanse the wound with Wound Cleanser prior to applying a clean dressing using gauze sponges, not tissues or cotton balls. Do not scrub or use excessive force. Pat dry using gauze sponges, not tissue or cotton balls. Primary Dressing: Hydrofera Blue Ready Transfer Foam, 4x5 (in/in) (DME) (Generic) 3 x Per Week/30 Days Discharge Instructions: CUT TO FIT-Apply Hydrofera Blue Ready to wound bed as directed Secondary Dressing: Zetuvit Plus Silicone Border Dressing 5x5 (in/in) (DME) (Generic) 3 x Per Week/30 Days Discharge Instructions: [place] Wound #2 - Lower Leg Wound Laterality: Left, Anterior, Proximal Cleanser: Soap and Water 3 x Per Week/30 Days Discharge Instructions: Gently cleanse wound with  antibacterial soap, rinse and pat dry prior to dressing wounds Primary Dressing: Hydrofera Blue Ready Transfer Foam, 4x5 (in/in) 3 x Per Week/30 Days Discharge Instructions: Apply Hydrofera Blue Ready to wound bed as directed Janet [name], [name] ([id number]) Secondary Dressing: Zetuvit Plus Silicone Border Dressing 5x5 (in/in) 3 x Per Week/30 Days Compression Stockings: Circaid Juxta Lite Compression Wrap (DME) Left Leg Compression Amount: 30-40 mmHG Discharge Instructions: Apply Circaid Juxta Lite Compression Wrap as directed Electronic Signature(s) Signed: 09/12/2020 9:42:50 AM By: Donnamarie Poag Signed: 09/12/2020 5:05:16 PM By: Melburn Hake,  Levada Bowersox PA-C Entered By: Donnamarie Poag on 09/12/2020 09:24:20 Janet [name], [name] ([id number]) -------------------------------------------------------------------------------- Problem List Details Patient Name: Janet [name]. Date of Service: 09/12/2020 8:45 AM Medical Record Number: [id number] Patient Account Number: [id number] Date of Birth/Sex: 04/26/1950 (70 y.o. F) Treating RN: Donnamarie Poag Primary Care Provider: Delight Stare Other Clinician: Referring Provider: Delight Stare Treating Provider/Extender: Skipper Cliche in Treatment: 2 Active Problems ICD-10 Encounter Code Description Active Date MDM Diagnosis I87.2 Venous insufficiency (chronic) (peripheral) 08/29/2020 No Yes I89.0 Lymphedema, not elsewhere classified 08/29/2020 No Yes 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 of other part of left lower leg with fat layer 08/29/2020 No Yes exposed L03.115 Cellulitis of right lower limb 09/05/2020 No Yes R22.41 Localized swelling, mass and lump, right lower limb 09/05/2020 No Yes Inactive Problems Resolved Problems Electronic Signature(s) Signed: 09/12/2020 9:01:06 AM By: Worthy Keeler PA-C Entered By: Worthy Keeler on 09/12/2020 09:01:06 Janet [name], [name] ([id number]) -------------------------------------------------------------------------------- Progress Note  Details Patient Name: Janet [name]. Date of Service: 09/12/2020 8:45 AM Medical Record Number: [id number] Patient Account Number: [id number] Date of Birth/Sex: [birth year] (70 y.o. F) Treating RN: Donnamarie Poag Primary Care Provider: Delight Stare Other Clinician: Referring Provider: Delight Stare Treating Provider/Extender: Skipper Cliche in Treatment: 2 Subjective Chief Complaint Information obtained from Patient Left LE Ulcers History of Present Illness (HPI) 08/29/2020 upon evaluation today patient appears to be doing somewhat poorly in regard to what appeared to be venous leg ulcers on the left leg that she has been dealing with since around the beginning of May. Fortunately she does not have any signs of obvious infection unfortunately she does have quite a bit of necrotic tissue that does not really need to be debrided. She has been seeing vascular and they did wrap her for a time with what was likely an Haematologist. With that being said the [facility] boot has given her some trouble as far as feeling claustrophobic was concerned she is not sure if she is good to be able to tolerate a compression wrap currently although we will get a try she is in agreement with Aleve given this shot. Fortunately I do not see any signs of active infection systemically which is great news and even locally she seems to be doing quite well today. Patient's Right ABI is 1.08 with a TBI of 0.79 and the left ABI is 1.15 with a TBI of 0.84 on 08/03/20. She also has a history of chronic venous insufficiency and lymphedema but otherwise no major medical problems. 09/05/2020 upon evaluation today patient unfortunately appears to be having some issues today with her right leg this is completely different from what we have been taking care of on the left. She actually has some swelling and an area of erythema and pain which is in the medial calf region. There is no identifiable abscess definitely this could just be cellulitis  but with her history of DVT I think that we need to have this checked out. 09/12/2020 upon evaluation today patient appears to be doing better in regard to her wound. She has been tolerating the dressing changes without complication. Fortunately there does not appear to be any signs of active infection which is great news and overall I think that the patient is doing well in this regard. Her wounds are measuring smaller at both locations and the surface of the wound is looking better. With that being said she does have a wedding that she is supposed to be going to this weekend and states that  she really needs to not have the wrap on for that. Nonetheless regular and discuss options to try to help maintain while she has this time with the wedding. Objective Constitutional Well-nourished and well-hydrated in no acute distress. Vitals Time Taken: 8:51 AM, Height: 66 in, Weight: 192 lbs, BMI: 31, Temperature: 98.7 F, Pulse: 73 bpm, Respiratory Rate: 16 breaths/min, Blood Pressure: 130/74 mmHg. Respiratory normal breathing without difficulty. Psychiatric this patient is able to make decisions and demonstrates good insight into disease process. Alert and Oriented x 3. pleasant and cooperative. General Notes: Upon inspection patient's wound bed actually showed signs of good granulation epithelization at this point. Fortunately there does not appear to be any signs of active infection which is great news and overall very pleased with where things stand today. No fevers, chills, nausea, vomiting, or diarrhea. Integumentary (Hair, Skin) Wound #1 status is Open. Original cause of wound was Blister. The date acquired was: 05/15/2020. The wound has been in treatment 2 weeks. The wound is located on the [facility] Lower Leg. The wound measures 3.8cm length x 2cm width x 0.1cm depth; 5.969cm^2 area and 0.597cm^3 volume. There is Fat Layer (Subcutaneous Tissue) exposed. There is no tunneling or  undermining noted. There is a medium amount of serosanguineous drainage noted. There is medium (34-66%) red, pink, hyper - granulation within the wound bed. There is a medium (34-66%) amount of necrotic tissue within the wound bed including Adherent Slough. Wound #2 status is Open. Original cause of wound was Blister. The date acquired was: 05/15/2020. The wound has been in treatment 2 weeks. The wound is located on the Left,Proximal,Anterior Lower Leg. The wound measures 2cm length x 1.7cm width x 0.1cm depth; 2.67cm^2 area and Janet [name], [name] ([id number]) 0.267cm^3 volume. There is no tunneling or undermining noted. There is a medium amount of serosanguineous drainage noted. There is medium (34-66%) red, pink, hyper - granulation within the wound bed. There is a medium (34-66%) amount of necrotic tissue within the wound bed including Adherent Slough. Assessment Active Problems ICD-10 Venous insufficiency (chronic) (peripheral) Lymphedema, not elsewhere classified Non-pressure chronic ulcer of other part of left lower leg with fat layer exposed Cellulitis of right lower limb Localized swelling, mass and lump, right lower limb Procedures Wound #1 Pre-procedure diagnosis of Wound #1 is a Venous Leg Ulcer located on the Left,Distal,Anterior Lower Leg .Severity of Tissue Pre Debridement is: Fat layer exposed. There was a Excisional Skin/Subcutaneous Tissue Debridement with a total area of 7.6 sq cm performed by Tommie Sams., PA-C. With the following instrument(s): Curette to remove Viable tissue/material. Material removed includes Callus, Subcutaneous Tissue, Slough, and Biofilm after achieving pain control using Lidocaine 4% Topical Solution. A time out was conducted at 09:10, prior to the start of the procedure. A Minimum amount of bleeding was controlled with Pressure. The procedure was tolerated well. Post Debridement Measurements: 3.8cm length x 2cm width x 0.1cm depth; 0.597cm^3  volume. Character of Wound/Ulcer Post Debridement is improved. Severity of Tissue Post Debridement is: Fat layer exposed. Post procedure Diagnosis Wound #1: Same as Pre-Procedure Wound #2 Pre-procedure diagnosis of Wound #2 is a Venous Leg Ulcer located on the Left,Proximal,Anterior Lower Leg .Severity of Tissue Pre Debridement is: Fat layer exposed. There was a Excisional Skin/Subcutaneous Tissue Debridement with a total area of 3.4 sq cm performed by Tommie Sams., PA-C. With the following instrument(s): Curette to remove Viable tissue/material. Material removed includes Callus, Subcutaneous Tissue, Slough, and Biofilm after achieving pain control using Lidocaine 4% Topical  Solution. A time out was conducted at 09:10, prior to the start of the procedure. A Minimum amount of bleeding was controlled with Pressure. The procedure was tolerated well. Post Debridement Measurements: 2cm length x 1.7cm width x 0.1cm depth; 0.267cm^3 volume. Character of Wound/Ulcer Post Debridement is improved. Severity of Tissue Post Debridement is: Fat layer exposed. Post procedure Diagnosis Wound #2: Same as Pre-Procedure Plan Follow-up Appointments: Return Appointment in 1 week. Nurse Visit as needed Bathing/ Shower/ Hygiene: May shower with wound dressing protected with water repellent cover or cast protector. No tub bath. Edema Control - Lymphedema / Segmental Compressive Device / Other: Optional: One layer of unna paste to top of compression wrap (to act as an anchor). - left leg Patient to wear own compression stockings. Remove compression stockings every night before going to bed and put on every morning when getting up. - right leg-see Elastic Therapy contact and measurements Patient to wear own Velcro compression garment. Remove compression stockings every night before going to bed and put on every morning when getting up. - will be mailed to you, "JUXTELITE" Elevate legs to the level of the heart and  pump ankles as often as possible Elevate leg(s) parallel to the floor when sitting. DO YOUR BEST to sleep in the bed at night. DO NOT sleep in your recliner. Long hours of sitting in a recliner leads to swelling of the legs and/or potential wounds on your backside. Medications-Please add to medication list.: Take one '500mg'$  Tylenol (Acetaminophen) and one '200mg'$  Motrin (Ibuprofen) every 6 hours for pain. Do not take ibuprofen if you are on blood thinners or have stomach ulcers. - as needed for pain if not contraindicated by other physician WOUND #1: - Lower Leg Wound Laterality: Left, Anterior, Distal Cleanser: Soap and Water 3 x Per Week/30 Days Discharge Instructions: Gently cleanse wound with antibacterial soap, rinse and pat dry prior to dressing wounds Janet [name], [name] ([id number]) Cleanser: Wound Cleanser (DME) (Generic) 3 x Per Week/30 Days Discharge Instructions: Wash your hands with soap and water. Remove old dressing, discard into plastic bag and place into trash. Cleanse the wound with Wound Cleanser prior to applying a clean dressing using gauze sponges, not tissues or cotton balls. Do not scrub or use excessive force. Pat dry using gauze sponges, not tissue or cotton balls. Primary Dressing: Hydrofera Blue Ready Transfer Foam, 4x5 (in/in) (DME) (Generic) 3 x Per Week/30 Days Discharge Instructions: CUT TO FIT-Apply Hydrofera Blue Ready to wound bed as directed Secondary Dressing: Zetuvit Plus Silicone Border Dressing 5x5 (in/in) (DME) (Generic) 3 x Per Week/30 Days Discharge Instructions: [place] #2: - Lower Leg Wound Laterality: Left, Anterior, Proximal Cleanser: Soap and Water 3 x Per Week/30 Days Discharge Instructions: Gently cleanse wound with antibacterial soap, rinse and pat dry prior to dressing wounds Primary Dressing: Hydrofera Blue Ready Transfer Foam, 4x5 (in/in) 3 x Per Week/30 Days Discharge Instructions: Apply Hydrofera Blue Ready to  wound bed as directed Secondary Dressing: Zetuvit Plus Silicone Border Dressing 5x5 (in/in) 3 x Per Week/30 Days Compression Stockings: Circaid Juxta Lite Compression Wrap (DME) Compression Amount: 30-40 mmHg (left) Discharge Instructions: Apply Circaid Juxta Lite Compression Wrap as directed 1. Would recommend currently that we go ahead and continue with the [facility] which I feel like is doing a good job. We will just need to order some of this for the patient. 2. I would recommend that she cover this with a border foam dressing. 3. I am also  going to suggest that she use Tubigrip over top of this for now to help with compression. Obviously if we can get the juxta lite compression wrap for her this will be ideal and that is really what I am aiming for working to see about ordering that as well. 4. I am also can recommend the patient should be trying to elevate her leg is much as possible to help keep edema under control over the next week especially since were not good to be able to have her in a compression wrap. We will see patient back for reevaluation in 1 week here in the clinic. If anything worsens or changes patient will contact our office for additional recommendations. Electronic Signature(s) Signed: 09/12/2020 9:56:48 AM By: Worthy Keeler PA-C Entered By: Worthy Keeler on 09/12/2020 09:56:48 Janet [name], [name] ([id number]) -------------------------------------------------------------------------------- SuperBill Details Patient Name: Janet [name]. Date of Service: 09/12/2020 Medical Record Number: [id number] Patient Account Number: [id number] Date of Birth/Sex: 07-04-1950 (70 y.o. F) Treating RN: Donnamarie Poag Primary Care Provider: Delight Stare Other Clinician: Referring Provider: Delight Stare Treating Provider/Extender: Skipper Cliche in Treatment: 2 Diagnosis Coding ICD-10 Codes Code Description I87.2 Venous insufficiency (chronic) (peripheral) I89.0 Lymphedema, not  elsewhere classified 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 of other part of left lower leg with fat layer exposed L03.115 Cellulitis of right lower limb R22.41 Localized swelling, mass and lump, right lower limb Facility Procedures CPT4 Code: [id number] Description: [id number] - DEB SUBQ TISSUE 20 SQ CM/< Modifier: Quantity: 1 CPT4 Code: Description: ICD-10 Diagnosis Description 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 of other part of left lower leg with fat layer Modifier: exposed Quantity: Physician Procedures CPT4 Code: [id number] Description: [id number] - WC PHYS SUBQ TISS 20 SQ CM Modifier: Quantity: 1 CPT4 Code: Description: ICD-10 Diagnosis Description 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 of other part of left lower leg with fat layer Modifier: exposed Quantity: Electronic Signature(s) Signed: 09/12/2020 9:57:10 AM By: Worthy Keeler PA-C Previous Signature: 09/12/2020 9:42:50 AM Version By: Donnamarie Poag Entered By: Worthy Keeler on 09/12/2020 09:57:10

## 2020-09-20 ENCOUNTER — Encounter: Payer: Medicare Other | Attending: Physician Assistant | Admitting: Physician Assistant

## 2020-09-20 ENCOUNTER — Other Ambulatory Visit: Payer: Self-pay

## 2020-09-20 ENCOUNTER — Other Ambulatory Visit
Admission: RE | Admit: 2020-09-20 | Discharge: 2020-09-20 | Disposition: A | Discharge status: Home / Self Care | Payer: Medicare Other | Source: Ambulatory Visit | Attending: Physician Assistant | Admitting: Physician Assistant

## 2020-09-20 DIAGNOSIS — L089 Local infection of the skin and subcutaneous tissue, unspecified: Secondary | ICD-10-CM | POA: Diagnosis not present

## 2020-09-20 DIAGNOSIS — L03115 Cellulitis of right lower limb: Secondary | ICD-10-CM | POA: Insufficient documentation

## 2020-09-20 DIAGNOSIS — L97822 Non-pressure chronic ulcer of other part of left lower leg with fat layer exposed: Secondary | ICD-10-CM | POA: Insufficient documentation

## 2020-09-20 DIAGNOSIS — I872 Venous insufficiency (chronic) (peripheral): Secondary | ICD-10-CM | POA: Insufficient documentation

## 2020-09-20 DIAGNOSIS — I890 Lymphedema, not elsewhere classified: Secondary | ICD-10-CM | POA: Insufficient documentation

## 2020-09-20 NOTE — Progress Notes (Addendum)
Janet [name], Janet [name] ([id number]) Visit Report for 09/20/2020 Chief Complaint Document Details Patient Name: BIRCHIE, THUMM. Date of Service: 09/20/2020 9:30 AM Medical Record Number: [id number] Patient Account Number: [id number] Date of Birth/Sex: 04-10-1950 (70 y.o. F) Treating RN: Donnamarie Poag Primary Care Provider: Delight Stare Other Clinician: Referring Provider: Delight Stare Treating Provider/Extender: Skipper Cliche in Treatment: 3 Information Obtained from: Patient Chief Complaint Left LE Ulcers Electronic Signature(s) Signed: 09/20/2020 9:28:18 AM By: Worthy Keeler PA-C Entered By: Worthy Keeler on 09/20/2020 09:28:18 Stager, [name] ([id number]) -------------------------------------------------------------------------------- Debridement Details Patient Name: Janet [name]. Date of Service: 09/20/2020 9:30 AM Medical Record Number: [id number] Patient Account Number: [id number] Date of Birth/Sex: [birth year] (70 y.o. F) Treating RN: Donnamarie Poag Primary Care Provider: Delight Stare Other Clinician: Referring Provider: Delight Stare Treating Provider/Extender: Skipper Cliche in Treatment: 3 Debridement Performed for Wound #1 Left,Distal,Anterior Lower Leg Assessment: Performed By: Physician Tommie Sams., PA-C Debridement Type: Debridement Severity of Tissue Pre Debridement: Fat layer exposed Level of Consciousness (Pre- Awake and Alert procedure): Pre-procedure Verification/Time Out Yes - 10:00 Taken: Start Time: 10:00 Pain Control: Lidocaine Total Area Debrided (L x W): 4.3 (cm) x 2.8 (cm) = 12.04 (cm) Tissue and other material Viable, Non-Viable, Slough, Subcutaneous, Biofilm, Slough debrided: Level: Skin/Subcutaneous Tissue Debridement Description: Excisional Instrument: Curette Specimen: Tissue Culture Number of Specimens Taken: 1 Bleeding: Minimum Hemostasis Achieved: Pressure Response to Treatment: Procedure was tolerated well Level of Consciousness  (Post- Awake and Alert procedure): Post Debridement Measurements of Total Wound Length: (cm) 4.3 Width: (cm) 2.8 Depth: (cm) 0.1 Volume: (cm) 0.946 Character of Wound/Ulcer Post Debridement: Improved Severity of Tissue Post Debridement: Fat layer exposed Post Procedure Diagnosis Same as Pre-procedure Electronic Signature(s) Signed: 09/20/2020 3:45:53 PM By: Donnamarie Poag Signed: 09/23/2020 11:43:44 AM By: Worthy Keeler PA-C Entered By: Donnamarie Poag on 09/20/2020 10:06:20 Janet [name], [name] ([id number]) -------------------------------------------------------------------------------- Debridement Details Patient Name: Janet [name]. Date of Service: 09/20/2020 9:30 AM Medical Record Number: [id number] Patient Account Number: [id number] Date of Birth/Sex: 1950-01-26 (70 y.o. F) Treating RN: Donnamarie Poag Primary Care Provider: Delight Stare Other Clinician: Referring Provider: Delight Stare Treating Provider/Extender: Skipper Cliche in Treatment: 3 Debridement Performed for Wound #2 Left,Proximal,Anterior Lower Leg Assessment: Performed By: Physician Tommie Sams., PA-C Debridement Type: Debridement Severity of Tissue Pre Debridement: Fat layer exposed Level of Consciousness (Pre- Awake and Alert procedure): Pre-procedure Verification/Time Out Yes - 10:00 Taken: Start Time: 10:03 Pain Control: Lidocaine Total Area Debrided (L x W): 2.4 (cm) x 2.2 (cm) = 5.28 (cm) Tissue and other material Viable, Non-Viable, Slough, Subcutaneous, Biofilm, Slough debrided: Level: Skin/Subcutaneous Tissue Debridement Description: Excisional Instrument: Curette Bleeding: Minimum Hemostasis Achieved: Pressure Response to Treatment: Procedure was tolerated well Level of Consciousness (Post- Awake and Alert procedure): Post Debridement Measurements of Total Wound Length: (cm) 2.4 Width: (cm) 2.2 Depth: (cm) 0.1 Volume: (cm) 0.415 Character of Wound/Ulcer Post Debridement: Improved Severity of  Tissue Post Debridement: Fat layer exposed Post Procedure Diagnosis Same as Pre-procedure Electronic Signature(s) Signed: 09/20/2020 3:45:53 PM By: Donnamarie Poag Signed: 09/23/2020 11:43:44 AM By: Worthy Keeler PA-C Entered By: Donnamarie Poag on 09/20/2020 10:06:56 Janet [name], [name] ([id number]) -------------------------------------------------------------------------------- HPI Details Patient Name: Janet [name]. Date of Service: 09/20/2020 9:30 AM Medical Record Number: [id number] Patient Account Number: [id number] Date of Birth/Sex: 12-31-1950 (70 y.o. F) Treating RN: Donnamarie Poag Primary Care Provider: Delight Stare Other Clinician: Referring Provider: Delight Stare Treating Provider/Extender: Skipper Cliche in Treatment: 3 History of Present Illness HPI  Description: 08/29/2020 upon evaluation today patient appears to be doing somewhat poorly in regard to what appeared to be venous leg ulcers on the left leg that she has been dealing with since around the beginning of May. Fortunately she does not have any signs of obvious infection unfortunately she does have quite a bit of necrotic tissue that does not really need to be debrided. She has been seeing vascular and they did wrap her for a time with what was likely an Haematologist. With that being said the [facility] boot has given her some trouble as far as feeling claustrophobic was concerned she is not sure if she is good to be able to tolerate a compression wrap currently although we will get a try she is in agreement with Aleve given this shot. Fortunately I do not see any signs of active infection systemically which is great news and even locally she seems to be doing quite well today. Patient's Right ABI is 1.08 with a TBI of 0.79 and the left ABI is 1.15 with a TBI of 0.84 on 08/03/20. She also has a history of chronic venous insufficiency and lymphedema but otherwise no major medical problems. 09/05/2020 upon evaluation today patient unfortunately  appears to be having some issues today with her right leg this is completely different from what we have been taking care of on the left. She actually has some swelling and an area of erythema and pain which is in the medial calf region. There is no identifiable abscess definitely this could just be cellulitis but with her history of DVT I think that we need to have this checked out. 09/12/2020 upon evaluation today patient appears to be doing better in regard to her wound. She has been tolerating the dressing changes without complication. Fortunately there does not appear to be any signs of active infection which is great news and overall I think that the patient is doing well in this regard. Her wounds are measuring smaller at both locations and the surface of the wound is looking better. With that being said she does have a wedding that she is supposed to be going to this weekend and states that she really needs to not have the wrap on for that. Nonetheless regular and discuss options to try to help maintain while she has this time with the wedding. 09/20/2020 upon evaluation today patient unfortunately is doing worse in regard to the pain associated with her wound at this time. Fortunately there does not appear to be any evidence of active infection systemically which is great news but in general I feel like locally she may definitely be infected. Subsequently I do believe that we need to address this as soon as possible. Electronic Signature(s) Signed: 09/20/2020 5:32:10 PM By: Worthy Keeler PA-C Entered By: Worthy Keeler on 09/20/2020 17:32:10 Janet [name], [name] ([id number]) -------------------------------------------------------------------------------- Physical Exam Details Patient Name: Janet [name]. Date of Service: 09/20/2020 9:30 AM Medical Record Number: [id number] Patient Account Number: [id number] Date of Birth/Sex: 11-08-1950 (70 y.o. F) Treating RN: Donnamarie Poag Primary Care Provider:  Delight Stare Other Clinician: Referring Provider: Delight Stare Treating Provider/Extender: Skipper Cliche in Treatment: 3 Constitutional Well-nourished and well-hydrated in no acute distress. Respiratory normal breathing without difficulty. Psychiatric this patient is able to make decisions and demonstrates good insight into disease process. Alert and Oriented x 3. pleasant and cooperative. Notes Upon inspection patient's wound bed actually showed signs of good granulation epithelization at this point. Fortunately there does not  appear to be any evidence of active infection systemically but locally I think that is a different story. She still has an area of cellulitis on the right leg and on the left leg she is having an increased amount of pain which again makes me very concerned about this possibility. Electronic Signature(s) Signed: 09/20/2020 5:32:33 PM By: Worthy Keeler PA-C Entered By: Worthy Keeler on 09/20/2020 17:32:33 Janet [name], [name] ([id number]) -------------------------------------------------------------------------------- Physician Orders Details Patient Name: Janet [name]. Date of Service: 09/20/2020 9:30 AM Medical Record Number: [id number] Patient Account Number: [id number] Date of Birth/Sex: [birth year] (70 y.o. F) Treating RN: Donnamarie Poag Primary Care Provider: Delight Stare Other Clinician: Referring Provider: Delight Stare Treating Provider/Extender: Skipper Cliche in Treatment: 3 Verbal / Phone Orders: No Diagnosis Coding ICD-10 Coding Code Description I87.2 Venous insufficiency (chronic) (peripheral) I89.0 Lymphedema, not elsewhere classified 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 of other part of left lower leg with fat layer exposed L03.115 Cellulitis of right lower limb R22.41 Localized swelling, mass and lump, right lower limb Follow-up Appointments o Return Appointment in 1 week. o Nurse Visit as needed Bathing/ Shower/ Hygiene o May shower with  wound dressing protected with water repellent cover or cast protector. o No tub bath. Edema Control - Lymphedema / Segmental Compressive Device / Other o Optional: One layer of unna paste to top of compression wrap (to act as an anchor). - left leg o Patient to wear own compression stockings. Remove compression stockings every night before going to bed and put on every morning when getting up. - right leg-see Elastic Therapy contact and measurements o Patient to wear own Velcro compression garment. Remove compression stockings every night before going to bed and put on every morning when getting up. - will be mailed to you, "JUXTELITE" o Elevate legs to the level of the heart and pump ankles as often as possible o Elevate leg(s) parallel to the floor when sitting. o DO YOUR BEST to sleep in the bed at night. DO NOT sleep in your recliner. Long hours of sitting in a recliner leads to swelling of the legs and/or potential wounds on your backside. Medications-Please add to medication list. o Take one '500mg'$  Tylenol (Acetaminophen) and one '200mg'$  Motrin (Ibuprofen) every 6 hours for pain. Do not take ibuprofen if you are on blood thinners or have stomach ulcers. - as needed for pain if not contraindicated by other physician Wound Treatment Wound #1 - Lower Leg Wound Laterality: Left, Anterior, Distal Cleanser: Soap and Water 1 x Per Week/30 Days Discharge Instructions: Gently cleanse wound with antibacterial soap, rinse and pat dry prior to dressing wounds Cleanser: Wound Cleanser (Generic) 1 x Per Week/30 Days Discharge Instructions: Wash your hands with soap and water. Remove old dressing, discard into plastic bag and place into trash. Cleanse the wound with Wound Cleanser prior to applying a clean dressing using gauze sponges, not tissues or cotton balls. Do not scrub or use excessive force. Pat dry using gauze sponges, not tissue or cotton balls. Primary Dressing: Hydrofera Blue  Ready Transfer Foam, 4x5 (in/in) (Generic) 1 x Per Week/30 Days Discharge Instructions: CUT TO FIT-Apply Hydrofera Blue Ready to wound bed as directed Compression Wrap: Profore Lite LF 3 Multilayer Compression Bandaging System 1 x Per Week/30 Days Discharge Instructions: Apply 3 multi-layer wrap as prescribed. Wound #2 - Lower Leg Wound Laterality: Left, Anterior, Proximal Cleanser: Soap and Water 1 x Per Week/30 Days Discharge Instructions: Gently cleanse wound with antibacterial soap, rinse and pat dry  prior to dressing wounds Cleanser: Wound Cleanser (Generic) 1 x Per Week/30 Days MASIE, CHIARAMONTE ([id number]) Discharge Instructions: Wash your hands with soap and water. Remove old dressing, discard into plastic bag and place into trash. Cleanse the wound with Wound Cleanser prior to applying a clean dressing using gauze sponges, not tissues or cotton balls. Do not scrub or use excessive force. Pat dry using gauze sponges, not tissue or cotton balls. Primary Dressing: Hydrofera Blue Ready Transfer Foam, 4x5 (in/in) (Generic) 1 x Per Week/30 Days Discharge Instructions: CUT TO FIT-Apply Hydrofera Blue Ready to wound bed as directed Compression Wrap: Profore Lite LF 3 Multilayer Compression Bandaging System 1 x Per Week/30 Days Discharge Instructions: Apply 3 multi-layer wrap as prescribed. Laboratory o Bacteria identified in Wound by Culture (MICRO) - PICK UP THE ANTIBIOTIC YOU CURRENTLY TAKE AND PICK THE NEW MED AT THE STORE START THE NEW ONE TODAY; IF YOU NEED ANYMORE CHANGES WE WILL CALL YOU oooo LOINC Code: [id number] oooo Convenience Name: Wound culture routine Patient Medications Allergies: penicillin Notifications Medication Indication Start End Levaquin 09/20/2020 DOSE 1 - oral 500 mg tablet - 1 tablet oral taken 1 time per day for 14 days Electronic Signature(s) Signed: 09/20/2020 4:21:33 PM By: Worthy Keeler PA-C Previous Signature: 09/20/2020 3:45:53 PM Version By: Donnamarie Poag Entered  By: Worthy Keeler on 09/20/2020 16:21:32 Dacono, Nakiesha M. ([id number]) -------------------------------------------------------------------------------- Problem List Details Patient Name: Janet [name]. Date of Service: 09/20/2020 9:30 AM Medical Record Number: [id number] Patient Account Number: [id number] Date of Birth/Sex: 11-14-1950 (70 y.o. F) Treating RN: Donnamarie Poag Primary Care Provider: Delight Stare Other Clinician: Referring Provider: Delight Stare Treating Provider/Extender: Skipper Cliche in Treatment: 3 Active Problems ICD-10 Encounter Code Description Active Date MDM Diagnosis I87.2 Venous insufficiency (chronic) (peripheral) 08/29/2020 No Yes I89.0 Lymphedema, not elsewhere classified 08/29/2020 No Yes 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 of other part of left lower leg with fat layer 08/29/2020 No Yes exposed L03.115 Cellulitis of right lower limb 09/05/2020 No Yes R22.41 Localized swelling, mass and lump, right lower limb 09/05/2020 No Yes Inactive Problems Resolved Problems Electronic Signature(s) Signed: 09/20/2020 9:28:11 AM By: Worthy Keeler PA-C Entered By: Worthy Keeler on 09/20/2020 09:28:11 Releford, Adiana M. ([id number]) -------------------------------------------------------------------------------- Progress Note Details Patient Name: Janet [name]. Date of Service: 09/20/2020 9:30 AM Medical Record Number: [id number] Patient Account Number: [id number] Date of Birth/Sex: [birth year] (70 y.o. F) Treating RN: Donnamarie Poag Primary Care Provider: Delight Stare Other Clinician: Referring Provider: Delight Stare Treating Provider/Extender: Skipper Cliche in Treatment: 3 Subjective Chief Complaint Information obtained from Patient Left LE Ulcers History of Present Illness (HPI) 08/29/2020 upon evaluation today patient appears to be doing somewhat poorly in regard to what appeared to be venous leg ulcers on the left leg that she has been dealing with since around the  beginning of May. Fortunately she does not have any signs of obvious infection unfortunately she does have quite a bit of necrotic tissue that does not really need to be debrided. She has been seeing vascular and they did wrap her for a time with what was likely an Haematologist. With that being said the [facility] boot has given her some trouble as far as feeling claustrophobic was concerned she is not sure if she is good to be able to tolerate a compression wrap currently although we will get a try she is in agreement with Aleve given this shot. Fortunately I do not see any signs of active infection systemically which is  great news and even locally she seems to be doing quite well today. Patient's Right ABI is 1.08 with a TBI of 0.79 and the left ABI is 1.15 with a TBI of 0.84 on 08/03/20. She also has a history of chronic venous insufficiency and lymphedema but otherwise no major medical problems. 09/05/2020 upon evaluation today patient unfortunately appears to be having some issues today with her right leg this is completely different from what we have been taking care of on the left. She actually has some swelling and an area of erythema and pain which is in the medial calf region. There is no identifiable abscess definitely this could just be cellulitis but with her history of DVT I think that we need to have this checked out. 09/12/2020 upon evaluation today patient appears to be doing better in regard to her wound. She has been tolerating the dressing changes without complication. Fortunately there does not appear to be any signs of active infection which is great news and overall I think that the patient is doing well in this regard. Her wounds are measuring smaller at both locations and the surface of the wound is looking better. With that being said she does have a wedding that she is supposed to be going to this weekend and states that she really needs to not have the wrap on for that. Nonetheless  regular and discuss options to try to help maintain while she has this time with the wedding. 09/20/2020 upon evaluation today patient unfortunately is doing worse in regard to the pain associated with her wound at this time. Fortunately there does not appear to be any evidence of active infection systemically which is great news but in general I feel like locally she may definitely be infected. Subsequently I do believe that we need to address this as soon as possible. Objective Constitutional Well-nourished and well-hydrated in no acute distress. Vitals Time Taken: 9:48 AM, Height: 66 in, Weight: 192 lbs, BMI: 31, Temperature: 98.4 F, Pulse: 71 bpm, Respiratory Rate: 16 breaths/min, Blood Pressure: 130/75 mmHg. Respiratory normal breathing without difficulty. Psychiatric this patient is able to make decisions and demonstrates good insight into disease process. Alert and Oriented x 3. pleasant and cooperative. General Notes: Upon inspection patient's wound bed actually showed signs of good granulation epithelization at this point. Fortunately there does not appear to be any evidence of active infection systemically but locally I think that is a different story. She still has an area of cellulitis on the right leg and on the left leg she is having an increased amount of pain which again makes me very concerned about this possibility. Integumentary (Hair, Skin) Wound #1 status is Open. Original cause of wound was Blister. The date acquired was: 05/15/2020. The wound has been in treatment 3 weeks. The wound is located on the [facility] Lower Leg. The wound measures 4.3cm length x 2.8cm width x 0.1cm depth; 9.456cm^2 area and 0.946cm^3 volume. There is Fat Layer (Subcutaneous Tissue) exposed. There is no tunneling or undermining noted. There is a medium amount of serosanguineous drainage noted. There is medium (34-66%) red, pink, hyper - granulation within the wound bed. There is a medium  (34-66%) Janet [name], [name] ([id number]) amount of necrotic tissue within the wound bed including Adherent Slough. Wound #2 status is Open. Original cause of wound was Blister. The date acquired was: 05/15/2020. The wound has been in treatment 3 weeks. The wound is located on the Left,Proximal,Anterior Lower Leg. The wound measures 2.4cm length  x 2.2cm width x 0.1cm depth; 4.147cm^2 area and 0.415cm^3 volume. There is no tunneling or undermining noted. There is a medium amount of serosanguineous drainage noted. There is medium (34-66%) red, pink, hyper - granulation within the wound bed. There is a medium (34-66%) amount of necrotic tissue within the wound bed including Adherent Slough. Assessment Active Problems ICD-10 Venous insufficiency (chronic) (peripheral) Lymphedema, not elsewhere classified Non-pressure chronic ulcer of other part of left lower leg with fat layer exposed Cellulitis of right lower limb Localized swelling, mass and lump, right lower limb Procedures Wound #1 Pre-procedure diagnosis of Wound #1 is a Venous Leg Ulcer located on the Left,Distal,Anterior Lower Leg .Severity of Tissue Pre Debridement is: Fat layer exposed. There was a Excisional Skin/Subcutaneous Tissue Debridement with a total area of 12.04 sq cm performed by Tommie Sams., PA-C. With the following instrument(s): Curette to remove Viable and Non-Viable tissue/material. Material removed includes Subcutaneous Tissue, Slough, and Biofilm after achieving pain control using Lidocaine. 1 specimen was taken by a Tissue Culture and sent to the lab per facility protocol. A time out was conducted at 10:00, prior to the start of the procedure. A Minimum amount of bleeding was controlled with Pressure. The procedure was tolerated well. Post Debridement Measurements: 4.3cm length x 2.8cm width x 0.1cm depth; 0.946cm^3 volume. Character of Wound/Ulcer Post Debridement is improved. Severity of Tissue Post Debridement is: Fat  layer exposed. Post procedure Diagnosis Wound #1: Same as Pre-Procedure Pre-procedure diagnosis of Wound #1 is a Venous Leg Ulcer located on the Left,Distal,Anterior Lower Leg . There was a Three Layer Compression Therapy Procedure by Donnamarie Poag, RN. Post procedure Diagnosis Wound #1: Same as Pre-Procedure Wound #2 Pre-procedure diagnosis of Wound #2 is a Venous Leg Ulcer located on the Left,Proximal,Anterior Lower Leg .Severity of Tissue Pre Debridement is: Fat layer exposed. There was a Excisional Skin/Subcutaneous Tissue Debridement with a total area of 5.28 sq cm performed by Tommie Sams., PA-C. With the following instrument(s): Curette to remove Viable and Non-Viable tissue/material. Material removed includes Subcutaneous Tissue, Slough, and Biofilm after achieving pain control using Lidocaine. A time out was conducted at 10:00, prior to the start of the procedure. A Minimum amount of bleeding was controlled with Pressure. The procedure was tolerated well. Post Debridement Measurements: 2.4cm length x 2.2cm width x 0.1cm depth; 0.415cm^3 volume. Character of Wound/Ulcer Post Debridement is improved. Severity of Tissue Post Debridement is: Fat layer exposed. Post procedure Diagnosis Wound #2: Same as Pre-Procedure Plan Follow-up Appointments: Return Appointment in 1 week. Nurse Visit as needed Bathing/ Shower/ Hygiene: May shower with wound dressing protected with water repellent cover or cast protector. No tub bath. Edema Control - Lymphedema / Segmental Compressive Device / Other: Optional: One layer of unna paste to top of compression wrap (to act as an anchor). - left leg Patient to wear own compression stockings. Remove compression stockings every night before going to bed and put on every morning when getting up. - right leg-see Elastic Therapy contact and measurements Patient to wear own Velcro compression garment. Remove compression stockings every night before going to bed  and put on every morning when getting up. - will be mailed to you, "JUXTELITE" Elevate legs to the level of the heart and pump ankles as often as possible Janet [name], [name] ([id number]) Elevate leg(s) parallel to the floor when sitting. DO YOUR BEST to sleep in the bed at night. DO NOT sleep in your recliner. Long hours of sitting in a recliner leads  to swelling of the legs and/or potential wounds on your backside. Medications-Please add to medication list.: Take one '500mg'$  Tylenol (Acetaminophen) and one '200mg'$  Motrin (Ibuprofen) every 6 hours for pain. Do not take ibuprofen if you are on blood thinners or have stomach ulcers. - as needed for pain if not contraindicated by other physician Laboratory ordered were: Wound culture routine - PICK UP THE ANTIBIOTIC YOU CURRENTLY TAKE AND PICK THE NEW MED AT THE STORE START THE NEW ONE TODAY; IF YOU NEED ANYMORE CHANGES WE WILL CALL YOU The following medication(s) was prescribed: Levaquin oral 500 mg tablet 1 1 tablet oral taken 1 time per day for 14 days starting 09/20/2020 WOUND #1: - Lower Leg Wound Laterality: Left, Anterior, Distal Cleanser: Soap and Water 1 x Per Week/30 Days Discharge Instructions: Gently cleanse wound with antibacterial soap, rinse and pat dry prior to dressing wounds Cleanser: Wound Cleanser (Generic) 1 x Per Week/30 Days Discharge Instructions: Wash your hands with soap and water. Remove old dressing, discard into plastic bag and place into trash. Cleanse the wound with Wound Cleanser prior to applying a clean dressing using gauze sponges, not tissues or cotton balls. Do not scrub or use excessive force. Pat dry using gauze sponges, not tissue or cotton balls. Primary Dressing: Hydrofera Blue Ready Transfer Foam, 4x5 (in/in) (Generic) 1 x Per Week/30 Days Discharge Instructions: CUT TO FIT-Apply Hydrofera Blue Ready to wound bed as directed Compression Wrap: Profore Lite LF 3 Multilayer Compression Bandaging System 1 x Per  Week/30 Days Discharge Instructions: Apply 3 multi-layer wrap as prescribed. WOUND #2: - Lower Leg Wound Laterality: Left, Anterior, Proximal Cleanser: Soap and Water 1 x Per Week/30 Days Discharge Instructions: Gently cleanse wound with antibacterial soap, rinse and pat dry prior to dressing wounds Cleanser: Wound Cleanser (Generic) 1 x Per Week/30 Days Discharge Instructions: Wash your hands with soap and water. Remove old dressing, discard into plastic bag and place into trash. Cleanse the wound with Wound Cleanser prior to applying a clean dressing using gauze sponges, not tissues or cotton balls. Do not scrub or use excessive force. Pat dry using gauze sponges, not tissue or cotton balls. Primary Dressing: Hydrofera Blue Ready Transfer Foam, 4x5 (in/in) (Generic) 1 x Per Week/30 Days Discharge Instructions: CUT TO FIT-Apply Hydrofera Blue Ready to wound bed as directed Compression Wrap: Profore Lite LF 3 Multilayer Compression Bandaging System 1 x Per Week/30 Days Discharge Instructions: Apply 3 multi-layer wrap as prescribed. 1. Would recommend currently that we actually go ahead and get the patient started on a different antibiotic. I did actually send in a prescription today for Levaquin to try to see if we get this under control. 2. I am also can recommend that we continue with the [facility]. 3. We will also continue with a 3 layer compression wrap. 4. I did actually perform a wound culture actually cleaned the wound surface with the curette as lightly as I could but still effectively to get everything cleared away. Once everything was created off I then proceeded with a culture moistening the tip with sterile saline and then performing a swab sample of the base of the wound where this been cleaned away while gently squeezing the external portion of the wound. I was try to get the best sample as possible. We will see what this shows. We will see patient back for reevaluation in 1  week here in the clinic. If anything worsens or changes patient will contact our office for additional recommendations. Electronic  Signature(s) Signed: 09/20/2020 5:33:38 PM By: Worthy Keeler PA-C Entered By: Worthy Keeler on 09/20/2020 17:33:37 Janet [name], [name] ([id number]) -------------------------------------------------------------------------------- SuperBill Details Patient Name: Janet [name]. Date of Service: 09/20/2020 Medical Record Number: [id number] Patient Account Number: [id number] Date of Birth/Sex: 25-[name]-1950 (70 y.o. F) Treating RN: Donnamarie Poag Primary Care Provider: Delight Stare Other Clinician: Referring Provider: Delight Stare Treating Provider/Extender: Skipper Cliche in Treatment: 3 Diagnosis Coding ICD-10 Codes Code Description I87.2 Venous insufficiency (chronic) (peripheral) I89.0 Lymphedema, not elsewhere classified 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 of other part of left lower leg with fat layer exposed L03.115 Cellulitis of right lower limb R22.41 Localized swelling, mass and lump, right lower limb Facility Procedures CPT4 Code: [id number] Description: [id number] - DEB SUBQ TISSUE 20 SQ CM/< Modifier: Quantity: 1 CPT4 Code: Description: ICD-10 Diagnosis Description 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 of other part of left lower leg with fat layer Modifier: exposed Quantity: Physician Procedures CPT4 Code: [id number] Description: [id number] - WC PHYS LEVEL 4 - EST PT Modifier: 25 Quantity: 1 CPT4 Code: Description: ICD-10 Diagnosis Description I87.2 Venous insufficiency (chronic) (peripheral) I89.0 Lymphedema, not elsewhere classified 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 of other part of left lower leg with fat layer L03.115 Cellulitis of right lower  limb Modifier: exposed Quantity: CPT4 Code: [id number] Description: 11042 - WC PHYS SUBQ TISS 20 SQ CM Modifier: Quantity: 1 CPT4 Code: Description: ICD-10 Diagnosis Description 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 of  other part of left lower leg with fat layer Modifier: exposed Quantity: Electronic Signature(s) Signed: 09/20/2020 5:33:57 PM By: Worthy Keeler PA-C Previous Signature: 09/20/2020 3:45:53 PM Version By: Donnamarie Poag Entered By: Worthy Keeler on 09/20/2020 17:33:57

## 2020-09-22 LAB — AEROBIC CULTURE W GRAM STAIN (SUPERFICIAL SPECIMEN)

## 2020-09-26 ENCOUNTER — Encounter: Payer: Medicare Other | Admitting: Physician Assistant

## 2020-09-26 ENCOUNTER — Other Ambulatory Visit: Payer: Self-pay

## 2020-09-26 DIAGNOSIS — I890 Lymphedema, not elsewhere classified: Secondary | ICD-10-CM | POA: Diagnosis not present

## 2020-09-26 DIAGNOSIS — L03115 Cellulitis of right lower limb: Secondary | ICD-10-CM | POA: Diagnosis not present

## 2020-09-26 DIAGNOSIS — L97822 Non-pressure chronic ulcer of other part of left lower leg with fat layer exposed: Secondary | ICD-10-CM | POA: Diagnosis not present

## 2020-09-26 DIAGNOSIS — I872 Venous insufficiency (chronic) (peripheral): Secondary | ICD-10-CM | POA: Diagnosis not present

## 2020-09-26 NOTE — Progress Notes (Addendum)
ELANOR, HORACEK ([id number]) Visit Report for 09/26/2020 Chief Complaint Document Details Patient Name: Janet [name], Janet [name]. Date of Service: 09/26/2020 8:15 AM Medical Record Number: [id number] Patient Account Number: [id number] Date of Birth/Sex: 11-14-1950 (70 y.o. F) Treating RN: Dolan Amen Primary Care Provider: Delight Stare Other Clinician: Referring Provider: Delight Stare Treating Provider/Extender: Skipper Cliche in Treatment: 4 Information Obtained from: Patient Chief Complaint Left LE Ulcers Electronic Signature(s) Signed: 09/26/2020 8:57:10 AM By: Worthy Keeler PA-C Entered By: Worthy Keeler on 09/26/2020 08:57:10 Janet [name], [name] ([id number]) -------------------------------------------------------------------------------- HPI Details Patient Name: Janet [name]. Date of Service: 09/26/2020 8:15 AM Medical Record Number: [id number] Patient Account Number: [id number] Date of Birth/Sex: 07-Nov-1950 (70 y.o. F) Treating RN: Dolan Amen Primary Care Provider: Delight Stare Other Clinician: Referring Provider: Delight Stare Treating Provider/Extender: Skipper Cliche in Treatment: 4 History of Present Illness HPI Description: 08/29/2020 upon evaluation today patient appears to be doing somewhat poorly in regard to what appeared to be venous leg ulcers on the left leg that she has been dealing with since around the beginning of May. Fortunately she does not have any signs of obvious infection unfortunately she does have quite a bit of necrotic tissue that does not really need to be debrided. She has been seeing vascular and they did wrap her for a time with what was likely an Haematologist. With that being said the [facility] boot has given her some trouble as far as feeling claustrophobic was concerned she is not sure if she is good to be able to tolerate a compression wrap currently although we will get a try she is in agreement with Aleve given this shot. Fortunately I do not see any signs  of active infection systemically which is great news and even locally she seems to be doing quite well today. Patient's Right ABI is 1.08 with a TBI of 0.79 and the left ABI is 1.15 with a TBI of 0.84 on 08/03/20. She also has a history of chronic venous insufficiency and lymphedema but otherwise no major medical problems. 09/05/2020 upon evaluation today patient unfortunately appears to be having some issues today with her right leg this is completely different from what we have been taking care of on the left. She actually has some swelling and an area of erythema and pain which is in the medial calf region. There is no identifiable abscess definitely this could just be cellulitis but with her history of DVT I think that we need to have this checked out. 09/12/2020 upon evaluation today patient appears to be doing better in regard to her wound. She has been tolerating the dressing changes without complication. Fortunately there does not appear to be any signs of active infection which is great news and overall I think that the patient is doing well in this regard. Her wounds are measuring smaller at both locations and the surface of the wound is looking better. With that being said she does have a wedding that she is supposed to be going to this weekend and states that she really needs to not have the wrap on for that. Nonetheless regular and discuss options to try to help maintain while she has this time with the wedding. 09/20/2020 upon evaluation today patient unfortunately is doing worse in regard to the pain associated with her wound at this time. Fortunately there does not appear to be any evidence of active infection systemically which is great news but in general I feel like locally she may  definitely be infected. Subsequently I do believe that we need to address this as soon as possible. 09/26/2020 upon evaluation today patient appears to be doing well with regard to her wound. Fortunately there  does not appear to be any signs of worsening in general. I think that overall the patient is making wonderful progress in my opinion. The right leg where she had an area of erythema medially also is better today which is great news. I think Levaquin has done a great job in that regard. We did see evidence still some erythema on the right leg but again this is significantly improved compared to last week. Overall the Levaquin has done a great job in this regard. Electronic Signature(s) Signed: 09/26/2020 9:21:31 AM By: Worthy Keeler PA-C Entered By: Worthy Keeler on 09/26/2020 09:21:30 Janet [name], [name] ([id number]) -------------------------------------------------------------------------------- Physical Exam Details Patient Name: Janet [name]. Date of Service: 09/26/2020 8:15 AM Medical Record Number: [id number] Patient Account Number: [id number] Date of Birth/Sex: 07-16-1950 (70 y.o. F) Treating RN: Dolan Amen Primary Care Provider: Delight Stare Other Clinician: Referring Provider: Delight Stare Treating Provider/Extender: Skipper Cliche in Treatment: 4 Constitutional Well-nourished and well-hydrated in no acute distress. Respiratory normal breathing without difficulty. Psychiatric this patient is able to make decisions and demonstrates good insight into disease process. Alert and Oriented x 3. pleasant and cooperative. Notes Upon inspection patient's wound bed actually showed signs of good granulation epithelization at this point. I am actually seeing better improvement overall as compared to last week even which is also great news. Overall I think that the patient is better though she still having a lot of pain she declines any debridement today. Electronic Signature(s) Signed: 09/26/2020 9:21:50 AM By: Worthy Keeler PA-C Entered By: Worthy Keeler on 09/26/2020 09:21:50 Janet [name], [name]  ([id number]) -------------------------------------------------------------------------------- Physician Orders Details Patient Name: Janet [name]. Date of Service: 09/26/2020 8:15 AM Medical Record Number: [id number] Patient Account Number: [id number] Date of Birth/Sex: [birth year] (70 y.o. F) Treating RN: Dolan Amen Primary Care Provider: Delight Stare Other Clinician: Referring Provider: Delight Stare Treating Provider/Extender: Skipper Cliche in Treatment: 4 Verbal / Phone Orders: No Diagnosis Coding ICD-10 Coding Code Description I87.2 Venous insufficiency (chronic) (peripheral) I89.0 Lymphedema, not elsewhere classified 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 of other part of left lower leg with fat layer exposed L03.115 Cellulitis of right lower limb R22.41 Localized swelling, mass and lump, right lower limb Follow-up Appointments o Return Appointment in 1 week. o Nurse Visit as needed Bathing/ Shower/ Hygiene o May shower with wound dressing protected with water repellent cover or cast protector. o No tub bath. Edema Control - Lymphedema / Segmental Compressive Device / Other o Optional: One layer of unna paste to top of compression wrap (to act as an anchor). - left leg o Patient to wear own compression stockings. Remove compression stockings every night before going to bed and put on every morning when getting up. - right leg-see Elastic Therapy contact and measurements o Patient to wear own Velcro compression garment. Remove compression stockings every night before going to bed and put on every morning when getting up. - will be mailed to you, "JUXTELITE" o Elevate legs to the level of the heart and pump ankles as often as possible o Elevate leg(s) parallel to the floor when sitting. o DO YOUR BEST to sleep in the bed at night. DO NOT sleep in your recliner. Long hours of sitting in a recliner leads to swelling of the legs and/or  potential wounds on your  backside. Medications-Please add to medication list. o Take one '500mg'$  Tylenol (Acetaminophen) and one '200mg'$  Motrin (Ibuprofen) every 6 hours for pain. Do not take ibuprofen if you are on blood thinners or have stomach ulcers. - as needed for pain if not contraindicated by other physician o P.O. Antibiotics - Finish antibiotics Wound Treatment Wound #1 - Lower Leg Wound Laterality: Left, Anterior, Distal Cleanser: Soap and Water 1 x Per Week/30 Days Discharge Instructions: Gently cleanse wound with antibacterial soap, rinse and pat dry prior to dressing wounds Primary Dressing: Hydrofera Blue Ready Transfer Foam, 4x5 (in/in) (Generic) 1 x Per Week/30 Days Discharge Instructions: CUT TO FIT-Apply Hydrofera Blue Ready to wound bed as directed Compression Wrap: Profore Lite LF 3 Multilayer Compression Bandaging System 1 x Per Week/30 Days Discharge Instructions: Apply 3 multi-layer wrap as prescribed. Wound #2 - Lower Leg Wound Laterality: Left, Anterior, Proximal Cleanser: Soap and Water 1 x Per Week/30 Days Discharge Instructions: Gently cleanse wound with antibacterial soap, rinse and pat dry prior to dressing wounds Primary Dressing: Hydrofera Blue Ready Transfer Foam, 4x5 (in/in) (Generic) 1 x Per Week/30 Days Discharge Instructions: CUT TO FIT-Apply Hydrofera Blue Ready to wound bed as directed Compression Wrap: Profore Lite LF 3 Multilayer Compression Bandaging System 1 x Per Week/30 Days Discharge Instructions: Apply 3 multi-layer wrap as prescribed. Janet [name], [name] ([id number]) Electronic Signature(s) Signed: 09/26/2020 4:52:35 PM By: Dolan Amen RN Signed: 09/26/2020 6:07:16 PM By: Worthy Keeler PA-C Entered By: Dolan Amen on 09/26/2020 09:06:20 Janet [name], [name] ([id number]) -------------------------------------------------------------------------------- Problem List Details Patient Name: Janet [name]. Date of Service: 09/26/2020 8:15 AM Medical Record Number:  [id number] Patient Account Number: [id number] Date of Birth/Sex: July 27, 1950 (70 y.o. F) Treating RN: Dolan Amen Primary Care Provider: Delight Stare Other Clinician: Referring Provider: Delight Stare Treating Provider/Extender: Skipper Cliche in Treatment: 4 Active Problems ICD-10 Encounter Code Description Active Date MDM Diagnosis I87.2 Venous insufficiency (chronic) (peripheral) 08/29/2020 No Yes I89.0 Lymphedema, not elsewhere classified 08/29/2020 No Yes 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 of other part of left lower leg with fat layer 08/29/2020 No Yes exposed L03.115 Cellulitis of right lower limb 09/05/2020 No Yes R22.41 Localized swelling, mass and lump, right lower limb 09/05/2020 No Yes Inactive Problems Resolved Problems Electronic Signature(s) Signed: 09/26/2020 8:57:05 AM By: Worthy Keeler PA-C Entered By: Worthy Keeler on 09/26/2020 08:57:04 Janet [name], [name] ([id number]) -------------------------------------------------------------------------------- Progress Note Details Patient Name: Janet [name]. Date of Service: 09/26/2020 8:15 AM Medical Record Number: [id number] Patient Account Number: [id number] Date of Birth/Sex: 03-22-1950 (70 y.o. F) Treating RN: Dolan Amen Primary Care Provider: Delight Stare Other Clinician: Referring Provider: Delight Stare Treating Provider/Extender: Skipper Cliche in Treatment: 4 Subjective Chief Complaint Information obtained from Patient Left LE Ulcers History of Present Illness (HPI) 08/29/2020 upon evaluation today patient appears to be doing somewhat poorly in regard to what appeared to be venous leg ulcers on the left leg that she has been dealing with since around the beginning of May. Fortunately she does not have any signs of obvious infection unfortunately she does have quite a bit of necrotic tissue that does not really need to be debrided. She has been seeing vascular and they did wrap her for a time with what was  likely an Haematologist. With that being said the [facility] boot has given her some trouble as far as feeling claustrophobic was concerned she is not sure if she is good to be able to tolerate  a compression wrap currently although we will get a try she is in agreement with Aleve given this shot. Fortunately I do not see any signs of active infection systemically which is great news and even locally she seems to be doing quite well today. Patient's Right ABI is 1.08 with a TBI of 0.79 and the left ABI is 1.15 with a TBI of 0.84 on 08/03/20. She also has a history of chronic venous insufficiency and lymphedema but otherwise no major medical problems. 09/05/2020 upon evaluation today patient unfortunately appears to be having some issues today with her right leg this is completely different from what we have been taking care of on the left. She actually has some swelling and an area of erythema and pain which is in the medial calf region. There is no identifiable abscess definitely this could just be cellulitis but with her history of DVT I think that we need to have this checked out. 09/12/2020 upon evaluation today patient appears to be doing better in regard to her wound. She has been tolerating the dressing changes without complication. Fortunately there does not appear to be any signs of active infection which is great news and overall I think that the patient is doing well in this regard. Her wounds are measuring smaller at both locations and the surface of the wound is looking better. With that being said she does have a wedding that she is supposed to be going to this weekend and states that she really needs to not have the wrap on for that. Nonetheless regular and discuss options to try to help maintain while she has this time with the wedding. 09/20/2020 upon evaluation today patient unfortunately is doing worse in regard to the pain associated with her wound at this time. Fortunately there does not appear  to be any evidence of active infection systemically which is great news but in general I feel like locally she may definitely be infected. Subsequently I do believe that we need to address this as soon as possible. 09/26/2020 upon evaluation today patient appears to be doing well with regard to her wound. Fortunately there does not appear to be any signs of worsening in general. I think that overall the patient is making wonderful progress in my opinion. The right leg where she had an area of erythema medially also is better today which is great news. I think Levaquin has done a great job in that regard. We did see evidence still some erythema on the right leg but again this is significantly improved compared to last week. Overall the Levaquin has done a great job in this regard. Objective Constitutional Well-nourished and well-hydrated in no acute distress. Vitals Time Taken: 8:23 AM, Height: 66 in, Weight: 192 lbs, BMI: 31, Temperature: 98.7 F, Pulse: 78 bpm, Respiratory Rate: 18 breaths/min, Blood Pressure: 149/76 mmHg. Respiratory normal breathing without difficulty. Psychiatric this patient is able to make decisions and demonstrates good insight into disease process. Alert and Oriented x 3. pleasant and cooperative. General Notes: Upon inspection patient's wound bed actually showed signs of good granulation epithelization at this point. I am actually seeing better improvement overall as compared to last week even which is also great news. Overall I think that the patient is better though she still having a lot of pain she declines any debridement today. Integumentary (Hair, Skin) Janet [name], [name] ([id number]) Wound #1 status is Open. Original cause of wound was Blister. The date acquired was: 05/15/2020. The wound has been in  treatment 4 weeks. The wound is located on the [facility] Lower Leg. The wound measures 4.7cm length x 2.7cm width x 0.2cm depth; 9.967cm^2 area and 1.993cm^3  volume. There is Fat Layer (Subcutaneous Tissue) exposed. There is no tunneling or undermining noted. There is a medium amount of serosanguineous drainage noted. There is small (1-33%) red granulation within the wound bed. There is a large (67-100%) amount of necrotic tissue within the wound bed including Adherent Slough. Wound #2 status is Open. Original cause of wound was Blister. The date acquired was: 05/15/2020. The wound has been in treatment 4 weeks. The wound is located on the Left,Proximal,Anterior Lower Leg. The wound measures 2.6cm length x 2cm width x 0.1cm depth; 4.084cm^2 area and 0.408cm^3 volume. There is Fat Layer (Subcutaneous Tissue) exposed. There is no tunneling or undermining noted. There is a medium amount of serosanguineous drainage noted. There is medium (34-66%) red, hyper - granulation within the wound bed. There is a medium (34-66%) amount of necrotic tissue within the wound bed including Adherent Slough. Assessment Active Problems ICD-10 Venous insufficiency (chronic) (peripheral) Lymphedema, not elsewhere classified Non-pressure chronic ulcer of other part of left lower leg with fat layer exposed Cellulitis of right lower limb Localized swelling, mass and lump, right lower limb Procedures Wound #1 Pre-procedure diagnosis of Wound #1 is a Venous Leg Ulcer located on the Left,Distal,Anterior Lower Leg . There was a Three Layer Compression Therapy Procedure with a pre-treatment ABI of 1.2 by Dolan Amen, RN. Post procedure Diagnosis Wound #1: Same as Pre-Procedure Wound #2 Pre-procedure diagnosis of Wound #2 is a Venous Leg Ulcer located on the Left,Proximal,Anterior Lower Leg . There was a Three Layer Compression Therapy Procedure with a pre-treatment ABI of 1.2 by Dolan Amen, RN. Post procedure Diagnosis Wound #2: Same as Pre-Procedure Plan Follow-up Appointments: Return Appointment in 1 week. Nurse Visit as needed Bathing/ Shower/ Hygiene: May shower  with wound dressing protected with water repellent cover or cast protector. No tub bath. Edema Control - Lymphedema / Segmental Compressive Device / Other: Optional: One layer of unna paste to top of compression wrap (to act as an anchor). - left leg Patient to wear own compression stockings. Remove compression stockings every night before going to bed and put on every morning when getting up. - right leg-see Elastic Therapy contact and measurements Patient to wear own Velcro compression garment. Remove compression stockings every night before going to bed and put on every morning when getting up. - will be mailed to you, "JUXTELITE" Elevate legs to the level of the heart and pump ankles as often as possible Elevate leg(s) parallel to the floor when sitting. DO YOUR BEST to sleep in the bed at night. DO NOT sleep in your recliner. Long hours of sitting in a recliner leads to swelling of the legs and/or potential wounds on your backside. Medications-Please add to medication list.: Take one '500mg'$  Tylenol (Acetaminophen) and one '200mg'$  Motrin (Ibuprofen) every 6 hours for pain. Do not take ibuprofen if you are on blood thinners or have stomach ulcers. - as needed for pain if not contraindicated by other physician P.O. Antibiotics - Finish antibiotics WOUND #1: - Lower Leg Wound Laterality: Left, Anterior, Distal Cleanser: Soap and Water 1 x Per Week/30 Days Discharge Instructions: Gently cleanse wound with antibacterial soap, rinse and pat dry prior to dressing wounds Primary Dressing: Hydrofera Blue Ready Transfer Foam, 4x5 (in/in) (Generic) 1 x Per Week/30 Days Janet [name], [name] ([id number]) Discharge Instructions: CUT TO FIT-Apply Hydrofera Blue  Ready to wound bed as directed Compression Wrap: Profore Lite LF 3 Multilayer Compression Bandaging System 1 x Per Week/30 Days Discharge Instructions: Apply 3 multi-layer wrap as prescribed. WOUND #2: - Lower Leg Wound Laterality: Left, Anterior,  Proximal Cleanser: Soap and Water 1 x Per Week/30 Days Discharge Instructions: Gently cleanse wound with antibacterial soap, rinse and pat dry prior to dressing wounds Primary Dressing: Hydrofera Blue Ready Transfer Foam, 4x5 (in/in) (Generic) 1 x Per Week/30 Days Discharge Instructions: CUT TO FIT-Apply Hydrofera Blue Ready to wound bed as directed Compression Wrap: Profore Lite LF 3 Multilayer Compression Bandaging System 1 x Per Week/30 Days Discharge Instructions: Apply 3 multi-layer wrap as prescribed. 1. Would recommend currently that we go ahead and initiate treatment with the a continuation of the Levaquin which I think is doing a good job. 2. I am also can recommend that we have the patient continue with the compression wrap which I think is doing a good job along with the [employer]. 3. I did his well use the MolecuLight device for evaluation of the wound bioburden. It did show evidence of cyan coloration which was indicative of Pseudomonas infection which the patient does seem to have done well with the Levaquin at this point so this can make sense as far as that is concerned as well. Overall very pleased with the fact that she is improving to some degree here. Fortunately I do not see any evidence of anything worsening today which is also great news. We will see patient back for reevaluation in 1 week here in the clinic. If anything worsens or changes patient will contact our office for additional recommendations. MolecuLight Medical Necessity Initial Evaluation of the wound to determine baseline bacterial bioburden Fluorescence bacterial imaging was medically necessary today due to: level Wound(s) Scanned The following wound(s) were scanned with MolecuLight DX Left distal and proximal leg ulcer MolecuLight Procedure The MolecularLight DX device was cleaned with a disinfectant wipe prior to use., The correct patient profile was confirmed and correct wound was verified., Range  finder sensor used to ensure appropriate distance The following was completed: selected between imaging unit and wound bed, Room lights were turned off and the ambient light sensor was checked., Blue circle appeared around the lightbulb., The fluorescence icon was selected. Screen was tapped to enhance focus and the image was captured. Additional drapes were used to ensure adequate darknesso Yes MolecuLight Results The results of todayos scan revealed: Cyan Colors, [employer] The indicated colors were noted in the following areas. In the center of the wound As a result of todayos scan, the following treatment plans were put in [facility] place. Potential ICD-10 Codes ICD-10 B96.5 pseudomonas (Cyan Color) Electronic Signature(s) Signed: 09/26/2020 9:45:23 AM By: Worthy Keeler PA-C Previous Signature: 09/26/2020 9:44:51 AM Version By: Worthy Keeler PA-C Entered By: Worthy Keeler on 09/26/2020 09:45:23 Janet [name], [name] ([id number]) -------------------------------------------------------------------------------- SuperBill Details Patient Name: Janet [name]. Date of Service: 09/26/2020 Medical Record Number: [id number] Patient Account Number: [id number] Date of Birth/Sex: 07/31/1950 (70 y.o. F) Treating RN: Dolan Amen Primary Care Provider: Delight Stare Other Clinician: Referring Provider: Delight Stare Treating Provider/Extender: Skipper Cliche in Treatment: 4 Diagnosis Coding ICD-10 Codes Code Description I87.2 Venous insufficiency (chronic) (peripheral) I89.0 Lymphedema, not elsewhere classified 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 of other part of left lower leg with fat layer exposed L03.115 Cellulitis of right lower limb R22.41 Localized swelling, mass and lump, right lower limb Facility Procedures CPT4 Code: [id number] Description: (Facility  Use Only) [id number] - APPLY MULTLAY COMPRS LWR LT LEG Modifier: Quantity: 1 CPT4 Code: [id number] Description: NONCNTACT RT  FLORO WND 1ST STE Modifier: Quantity: 1 CPT4 Code: Description: ICD-10 Diagnosis Description 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 of other part of left lower leg with fat layer ex Modifier: posed Quantity: CPT4 Code: [id number] Description: NONCNTACT RT FLORO WND EA ADDL Modifier: Quantity: 1 CPT4 Code: Description: ICD-10 Diagnosis Description 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 of other part of left lower leg with fat layer ex Modifier: posed Quantity: Physician Procedures CPT4 [id number] Description: [id number] - WC PHYS LEVEL 4 - EST PT Modifier: Quantity: 1 CPT4 Code: Description: ICD-10 Diagnosis Description I87.2 Venous insufficiency (chronic) (peripheral) I89.0 Lymphedema, not elsewhere classified 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 of other part of left lower leg with fat layer L03.115 Cellulitis of right lower  limb Modifier: exposed Quantity: CPT4 Code: [id number] Description: HC MOLECULIGHT WND IMG 1ST ANATOMIC SITE Modifier: Quantity: 1 CPT4 Code: Description: ICD-10 Diagnosis Description 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 of other part of left lower leg with fat layer Modifier: exposed Quantity: CPT4 Code: [id number] Description: HC MOLECULIGHT WND IMG ADDL ANATOMIC SITE Modifier: Quantity: 1 CPT4 Code: Description: ICD-10 Diagnosis Description 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

## 2020-09-26 NOTE — Progress Notes (Addendum)
Janet [name] ([id number]) Visit Report for 09/26/2020 Arrival Information Details Patient Name: Janet, [name]. Date of Service: 09/26/2020 8:15 AM Medical Record Number: [id number] Patient Account Number: [id number] Date of Birth/Sex: 07/22/1950 (70 y.o. F) Treating RN: Janet [name] Primary Care Janet [name]: Janet [name] Other Clinician: Referring Janet [name]: Janet [name] Treating Janet [name]/Extender: Janet [name] in Treatment: 4 Visit Information History Since Last Visit Has Dressing in Place as Prescribed: Yes Patient Arrived: Ambulatory Pain Present Now: Yes Arrival Time: 08:22 Accompanied By: self Transfer Assistance: None Patient Identification Verified: Yes Secondary Verification Process Completed: Yes Patient Has Alerts: Yes Patient Alerts: Patient on Blood Thinner Aspirin 92m AVVS ABI/TBI 7/22 ABI L- 1.15/ R- 1.08 Electronic Signature(s) Signed: 09/26/2020 4:52:35 PM By: SDolan AmenRN Entered By: SDolan Amenon 09/26/2020 08:23:02 [place] [name] ([id number] -------------------------------------------------------------------------------- Clinic Level of Care Assessment Details Patient Name: Janet [name] Date of Service: 09/26/2020 8:15 AM Medical Record Number: [id number] Account Number: [id number] of Birth/Sex: 207-28-1952(70y.o. F) Treating RN: SDolan AmenPrimary Care Janet [name]: Janet StareOther Clinician: Referring Janet [name]: Janet StareTreating Janet [name]/Extender: SSkipper Clichein Treatment: 4 Clinic Level of Care Assessment Items TOOL 1 Quantity Score '[]'  - Use when EandM and Procedure is performed on INITIAL visit 0 ASSESSMENTS - Nursing Assessment / Reassessment '[]'  - General Physical Exam (combine w/ comprehensive assessment (listed just below) when performed on new 0 pt. evals) '[]'  - 0 Comprehensive Assessment (HX, ROS, Risk Assessments, Wounds Hx, etc.) ASSESSMENTS - Wound and Skin Assessment / Reassessment '[]'  - Dermatologic  / Skin Assessment (not related to wound area) 0 ASSESSMENTS - Ostomy and/or Continence Assessment and Care '[]'  - Incontinence Assessment and Management 0 '[]'  - 0 Ostomy Care Assessment and Management (repouching, etc.) PROCESS - Coordination of Care '[]'  - Simple Patient / Family Education for ongoing care 0 '[]'  - 0 Complex (extensive) Patient / Family Education for ongoing care '[]'  - 0 Staff obtains CProgrammer, systems Records, Test Results / Process Orders '[]'  - 0 Staff telephones HHA, Nursing Homes / Clarify orders / etc '[]'  - 0 Routine Transfer to another Facility (non-emergent condition) '[]'  - 0 Routine Hospital Admission (non-emergent condition) '[]'  - 0 New Admissions / IBiomedical engineer/ Ordering NPWT, Apligraf, etc. '[]'  - 0 Emergency Hospital Admission (emergent condition) PROCESS - Special Needs '[]'  - Pediatric / Minor Patient Management 0 '[]'  - 0 Isolation Patient Management '[]'  - 0 Hearing / Language / Visual special needs '[]'  - 0 Assessment of Community assistance (transportation, D/C planning, etc.) '[]'  - 0 Additional assistance / Altered mentation '[]'  - 0 Support Surface(s) Assessment (bed, cushion, seat, etc.) INTERVENTIONS - Miscellaneous '[]'  - External ear exam 0 '[]'  - 0 Patient Transfer (multiple staff / HCivil Service fast streamer/ Similar devices) '[]'  - 0 Simple Staple / Suture removal (25 or less) '[]'  - 0 Complex Staple / Suture removal (26 or more) '[]'  - 0 Hypo/Hyperglycemic Management (do not check if billed separately) '[]'  - 0 Ankle / Brachial Index (ABI) - do not check if billed separately Has the patient been seen at the hospital within the last three years: Yes Total Score: 0 Level Of Care: ____ Janet [name]([id number] Electronic Signature(s) Signed: 09/26/2020 4:52:35 PM By: SDolan AmenRN Entered By: SDolan Amenon 09/26/2020 09:06:26 [place], [name] ([id number] -------------------------------------------------------------------------------- Compression Therapy  Details Patient Name: Janet [name] Date of Service: 09/26/2020 8:15 AM Medical Record Number: [id number] Account Number: [id number] of Birth/Sex: [birth year](70 y.o.  F) Treating RN: Janet [name] Primary Care Janet [name]: Janet [name] Other Clinician: Referring Janet [name]: Janet [name] Treating Kathrene Sinopoli/Extender: Janet [name] in Treatment: 4 Compression Therapy Performed for Wound Assessment: Wound #1 Left,Distal,Anterior Lower Leg Performed By: Janet Daniels, RN Compression Type: Three Layer Pre Treatment ABI: 1.2 Post Procedure Diagnosis Same as Pre-procedure Electronic Signature(s) Signed: 09/26/2020 4:52:35 PM By: Janet Amen RN Entered By: Janet [name] on 09/26/2020 09:05:30 Janet [name], [name] ([id number]) -------------------------------------------------------------------------------- Compression Therapy Details Patient Name: Janet [name]. Date of Service: 09/26/2020 8:15 AM Medical Record Number: [id number] Patient Account Number: [id number] Date of Birth/Sex: [birth year] (70 y.o. F) Treating RN: Janet [name] Primary Care Janet [name]: Janet [name] Other Clinician: Referring Janet [name]: Janet [name] Treating Janet [name]: Janet [name] in Treatment: 4 Compression Therapy Performed for Wound Assessment: Wound #2 Left,Proximal,Anterior Lower Leg Performed By: Janet Daniels, RN Compression Type: Three Layer Pre Treatment ABI: 1.2 Post Procedure Diagnosis Same as Pre-procedure Electronic Signature(s) Signed: 09/26/2020 4:52:35 PM By: Janet Amen RN Entered By: Janet [name] on 09/26/2020 09:05:30 Janet [name], [name] ([id number]) -------------------------------------------------------------------------------- Encounter Discharge Information Details Patient Name: Janet [name]. Date of Service: 09/26/2020 8:15 AM Medical Record Number: [id number] Patient Account Number: [id number] Date of Birth/Sex: 10/18/1950 (70 y.o. F) Treating RN:  Janet [name] Primary Care Aser Nylund: Janet [name] Other Clinician: Referring Ming Kunka: Janet [name] Treating Drevin Ortner/Extender: Janet [name] in Treatment: 4 Encounter Discharge Information Items Discharge Condition: Stable Ambulatory Status: Ambulatory Discharge Destination: Home Transportation: Private Auto Accompanied By: self Schedule Follow-up Appointment: Yes Clinical Summary of Care: Electronic Signature(s) Signed: 09/26/2020 4:40:27 PM By: Janet Amen RN Entered By: Janet [name] on 09/26/2020 16:40:26 [place], [place]. ([id number]) -------------------------------------------------------------------------------- Lower Extremity Assessment Details Patient Name: Janet [name]. Date of Service: 09/26/2020 8:15 AM Medical Record Number: [id number] Patient Account Number: [id number] Date of Birth/Sex: 1950/11/22 (70 y.o. F) Treating RN: Janet [name] Primary Care Jashua Knaak: Janet [name] Other Clinician: Referring Jahniya Duzan: Janet [name] Treating Coda Mathey/Extender: Janet [name] in Treatment: 4 Edema Assessment Assessed: [Left: Yes] [Right: No] Edema: [Left: Ye] [Right: s] Calf Left: Right: Point of Measurement: 29 cm From Medial Instep 38.5 cm Ankle Left: Right: Point of Measurement: 10 cm From Medial Instep 23.5 cm Vascular Assessment Pulses: Dorsalis Pedis Palpable: [Left:Yes] Electronic Signature(s) Signed: 09/26/2020 4:52:35 PM By: Janet Amen RN Entered By: Janet [name] on 09/26/2020 08:38:39 Janet [name], [name] ([id number]) -------------------------------------------------------------------------------- Multi Wound Chart Details Patient Name: Janet [name]. Date of Service: 09/26/2020 8:15 AM Medical Record Number: [id number] Patient Account Number: [id number] Date of Birth/Sex: February 19, 1950 (70 y.o. F) Treating RN: Janet [name] Primary Care Raekwon Winkowski: Janet [name] Other Clinician: Referring Anaalicia Reimann: Janet [name] Treating Nicholaus Steinke/Extender: Janet [name] in Treatment: 4 Vital Signs Height(in): 69 Pulse(bpm): 26 Weight(lbs): 192 Blood Pressure(mmHg): 149/76 Body Mass Index(BMI): 31 Temperature(F): 98.7 Respiratory Rate(breaths/min): 18 Photos: [N/A:N/A] Wound Location: Left, Distal, Anterior Lower Leg Left, Proximal, Anterior Lower Leg N/A Wounding Event: Blister Blister N/A Primary Etiology: Venous Leg Ulcer Venous Leg Ulcer N/A Secondary Etiology: Lymphedema Lymphedema N/A Comorbid History: Asthma, Hypertension Asthma, Hypertension N/A Date Acquired: 05/15/2020 05/15/2020 N/A Weeks of Treatment: 4 4 N/A Wound Status: Open Open N/A Measurements L x W x D (cm) 4.7x2.7x0.2 2.6x2x0.1 N/A Area (cm) : 9.967 4.084 N/A Volume (cm) : 1.993 0.408 N/A % Reduction in Area: -67.00% -246.70% N/A % Reduction in Volume: -233.80% -245.80% N/A Classification: Full Thickness Without Exposed Full Thickness Without Exposed N/A Support Structures Support Structures Exudate Amount: Medium Medium N/A Exudate  Type: Serosanguineous Serosanguineous N/A Exudate Color: red, brown red, brown N/A Granulation Amount: Small (1-33%) Medium (34-66%) N/A Granulation Quality: Red Red, Hyper-granulation N/A Necrotic Amount: Large (67-100%) Medium (34-66%) N/A Exposed Structures: Fat Layer (Subcutaneous Tissue): Fat Layer (Subcutaneous Tissue): N/A Yes Yes Fascia: No Fascia: No Tendon: No Tendon: No Muscle: No Muscle: No Joint: No Joint: No Bone: No Bone: No Epithelialization: None None N/A Treatment Notes Electronic Signature(s) Signed: 09/26/2020 4:52:35 PM By: Janet Amen RN Entered By: Janet [name] on 09/26/2020 08:58:55 Janet [name], [name] ([id number]) -------------------------------------------------------------------------------- Regal Details Patient Name: Janet [name]. Date of Service: 09/26/2020 8:15 AM Medical Record Number: [id number] Patient Account Number: [id number] Date of Birth/Sex: 1950-05-19 (70  y.o. F) Treating RN: Janet [name] Primary Care Neveen Daponte: Janet [name] Other Clinician: Referring Paiden Caraveo: Janet [name] Treating Akasia Ahmad/Extender: Janet [name] in Treatment: 4 Active Inactive Wound/Skin Impairment Nursing Diagnoses: Impaired tissue integrity Knowledge deficit related to smoking impact on wound healing Knowledge deficit related to ulceration/compromised skin integrity Goals: Patient/caregiver will verbalize understanding of skin care regimen Date Initiated: 08/29/2020 Date Inactivated: 09/20/2020 Target Resolution Date: 09/14/2020 Goal Status: Met Ulcer/skin breakdown will have a volume reduction of 30% by week 4 Date Initiated: 08/29/2020 Target Resolution Date: 09/29/2020 Goal Status: Active Ulcer/skin breakdown will have a volume reduction of 50% by week 8 Date Initiated: 08/29/2020 Target Resolution Date: 10/29/2020 Goal Status: Active Ulcer/skin breakdown will have a volume reduction of 80% by week 12 Date Initiated: 08/29/2020 Target Resolution Date: 11/29/2020 Goal Status: Active Ulcer/skin breakdown will heal within 14 weeks Date Initiated: 08/29/2020 Target Resolution Date: 12/14/2020 Goal Status: Active Interventions: Assess patient/caregiver ability to obtain necessary supplies Assess patient/caregiver ability to perform ulcer/skin care regimen upon admission and as needed Assess ulceration(s) every visit Provide education on ulcer and skin care Notes: Electronic Signature(s) Signed: 09/26/2020 4:52:35 PM By: Janet Amen RN Entered By: Janet [name] on 09/26/2020 08:58:47 Janet [name], [name] ([id number]) -------------------------------------------------------------------------------- Pain Assessment Details Patient Name: Janet [name]. Date of Service: 09/26/2020 8:15 AM Medical Record Number: [id number] Patient Account Number: [id number] Date of Birth/Sex: [birth year] (70 y.o. F) Treating RN: Janet [name] Primary Care Haliyah Fryman: Janet [name]  Other Clinician: Referring Tahliyah Anagnos: Janet [name] Treating Eryka Dolinger/Extender: Janet [name] in Treatment: 4 Active Problems Location of Pain Severity and Description of Pain Patient Has Paino Yes Site Locations Pain Location: Pain in Ulcers Rate the pain. Current Pain Level: 7 Pain Management and Medication Current Pain Management: Electronic Signature(s) Signed: 09/26/2020 4:52:35 PM By: Janet Amen RN Entered By: Janet [name] on 09/26/2020 08:23:50 Janet [name], [name] ([id number]) -------------------------------------------------------------------------------- Patient/Caregiver Education Details Patient Name: Janet [name]. Date of Service: 09/26/2020 8:15 AM Medical Record Number: [id number] Patient Account Number: [id number] Date of Birth/Gender: 06/24/1950 (70 y.o. F) Treating RN: Janet [name] Primary Care Physician: Janet [name] Other Clinician: Referring Physician: Delight [name] Treating Physician/Extender: Janet [name] in Treatment: 4 Education Assessment Education Provided To: Patient Education Topics Provided Wound/Skin Impairment: Methods: Explain/Verbal Responses: State content correctly Electronic Signature(s) Signed: 09/26/2020 4:52:35 PM By: Janet Amen RN Entered By: Janet [name] on 09/26/2020 09:06:41 [place], [place]. ([id number]) -------------------------------------------------------------------------------- Wound Assessment Details Patient Name: Janet [name]. Date of Service: 09/26/2020 8:15 AM Medical Record Number: [id number] Patient Account Number: [id number] Date of Birth/Sex: [birth year] (70 y.o. F) Treating RN: Janet [name] Primary Care Samuella Rasool: Janet [name] Other Clinician: Referring Makaiah Terwilliger: Janet [name] Treating Ryden Wainer/Extender: Janet [name] in Treatment: 4 Wound Status Wound Number: 1 Primary Etiology: Venous  Leg Ulcer Wound Location: Left, Distal, Anterior Lower Leg Secondary Etiology: Lymphedema Wounding Event:  Blister Wound Status: Open Date Acquired: 05/15/2020 Comorbid History: Asthma, Hypertension Weeks Of Treatment: 4 Clustered Wound: No Photos Wound Measurements Length: (cm) 4.7 Width: (cm) 2.7 Depth: (cm) 0.2 Area: (cm) 9.967 Volume: (cm) 1.993 % Reduction in Area: -67% % Reduction in Volume: -233.8% Epithelialization: None Tunneling: No Undermining: No Wound Description Classification: Full Thickness Without Exposed Support Structures Exudate Amount: Medium Exudate Type: Serosanguineous Exudate Color: red, brown Foul Odor After Cleansing: No Slough/Fibrino Yes Wound Bed Granulation Amount: Small (1-33%) Exposed Structure Granulation Quality: Red Fascia Exposed: No Necrotic Amount: Large (67-100%) Fat Layer (Subcutaneous Tissue) Exposed: Yes Necrotic Quality: Adherent Slough Tendon Exposed: No Muscle Exposed: No Joint Exposed: No Bone Exposed: No Treatment Notes Wound #1 (Lower Leg) Wound Laterality: Left, Anterior, Distal Cleanser Soap and Water Discharge Instruction: Gently cleanse wound with antibacterial soap, rinse and pat dry prior to dressing wounds Peri-Wound Care Bonds, Intisar M. ([id number]) Topical Primary Dressing Hydrofera Blue Ready Transfer Foam, 4x5 (in/in) Discharge Instruction: CUT TO FIT-Apply Hydrofera Blue Ready to wound bed as directed Secondary Dressing Secured With Compression Wrap Profore Lite LF 3 Multilayer Compression Bandaging System Discharge Instruction: Apply 3 multi-layer wrap as prescribed. Compression Stockings Add-Ons Electronic Signature(s) Signed: 09/26/2020 4:56:38 PM By: Gretta Cool, BSN, RN, CWS, Kim RN, BSN Signed: 09/27/2020 5:00:35 PM By: Janet Amen RN Previous Signature: 09/26/2020 4:52:35 PM Version By: Janet Amen RN Entered By: Gretta Cool BSN, RN, CWS, Kim on 09/26/2020 16:56:38 Janet [name], Janet [name] ([id number]) -------------------------------------------------------------------------------- Wound Assessment  Details Patient Name: Janet [name], BURRUEL. Date of Service: 09/26/2020 8:15 AM Medical Record Number: [id number] Patient Account Number: [id number] Date of Birth/Sex: 1950-03-08 (70 y.o. F) Treating RN: Janet [name] Primary Care Eldra Word: Janet [name] Other Clinician: Referring Cricket Goodlin: Janet [name] Treating Nami Strawder/Extender: Janet [name] in Treatment: 4 Wound Status Wound Number: 2 Primary Etiology: Venous Leg Ulcer Wound Location: Left, Proximal, Anterior Lower Leg Secondary Etiology: Lymphedema Wounding Event: Blister Wound Status: Open Date Acquired: 05/15/2020 Comorbid History: Asthma, Hypertension Weeks Of Treatment: 4 Clustered Wound: No Photos Wound Measurements Length: (cm) 2.6 Width: (cm) 2 Depth: (cm) 0.1 Area: (cm) 4.084 Volume: (cm) 0.408 % Reduction in Area: -246.7% % Reduction in Volume: -245.8% Epithelialization: None Tunneling: No Undermining: No Wound Description Classification: Full Thickness Without Exposed Support Structures Exudate Amount: Medium Exudate Type: Serosanguineous Exudate Color: red, brown Foul Odor After Cleansing: No Slough/Fibrino Yes Wound Bed Granulation Amount: Medium (34-66%) Exposed Structure Granulation Quality: Red, Hyper-granulation Fascia Exposed: No Necrotic Amount: Medium (34-66%) Fat Layer (Subcutaneous Tissue) Exposed: Yes Necrotic Quality: Adherent Slough Tendon Exposed: No Muscle Exposed: No Joint Exposed: No Bone Exposed: No Treatment Notes Wound #2 (Lower Leg) Wound Laterality: Left, Anterior, Proximal Cleanser Soap and Water Discharge Instruction: Gently cleanse wound with antibacterial soap, rinse and pat dry prior to dressing wounds Peri-Wound Care Wong, Lilliam M. ([id number]) Topical Primary Dressing Hydrofera Blue Ready Transfer Foam, 4x5 (in/in) Discharge Instruction: CUT TO FIT-Apply Hydrofera Blue Ready to wound bed as directed Secondary Dressing Secured With Compression Wrap Profore Lite LF 3  Multilayer Compression Bandaging System Discharge Instruction: Apply 3 multi-layer wrap as prescribed. Compression Stockings Environmental education officer) Signed: 09/26/2020 4:57:36 PM By: Gretta Cool, BSN, RN, CWS, Kim RN, BSN Signed: 09/27/2020 5:00:35 PM By: Janet Amen RN Previous Signature: 09/26/2020 4:52:35 PM Version By: Janet Amen RN Entered By: Gretta Cool BSN, RN, CWS, Kim on 09/26/2020 16:57:36 [place], [place] ([id number]) -------------------------------------------------------------------------------- Vitals Details Patient Name:  Philbrook, Damica M. Date of Service: 09/26/2020 8:15 AM Medical Record Number: [id number] Patient Account Number: [id number] Date of Birth/Sex: [birth year] (70 y.o. F) Treating RN: Janet [name] Primary Care Hopelynn Gartland: Janet [name] Other Clinician: Referring Nissim Fleischer: Janet [name] Treating Keyla Milone/Extender: Janet [name] in Treatment: 4 Vital Signs Time Taken: 08:23 Temperature (F): 98.7 Height (in): 66 Pulse (bpm): 78 Weight (lbs): 192 Respiratory Rate (breaths/min): 18 Body Mass Index (BMI): 31 Blood Pressure (mmHg): 149/76 Reference Range: 80 - 120 mg / dl Electronic Signature(s) Signed: 09/26/2020 4:52:35 PM By: Janet Amen RN Entered By: Janet [name] on 09/26/2020 94:32:00

## 2020-10-03 ENCOUNTER — Encounter: Payer: Medicare Other | Admitting: Physician Assistant

## 2020-10-03 ENCOUNTER — Other Ambulatory Visit: Payer: Self-pay

## 2020-10-03 DIAGNOSIS — L97822 Non-pressure chronic ulcer of other part of left lower leg with fat layer exposed: Secondary | ICD-10-CM | POA: Diagnosis not present

## 2020-10-03 NOTE — Progress Notes (Signed)
JADYNN, EPPING ([id number]) Visit Report for 10/03/2020 Arrival Information Details Patient Name: Janet [name], Janet [name]. Date of Service: 10/03/2020 8:30 AM Medical Record Number: [id number] Patient Account Number: [id number] Date of Birth/Sex: 1950/10/30 (70 y.o. F) Treating RN: Cornell Barman Primary Care Crytal Pensinger: Delight Stare Other Clinician: Referring Matei Magnone: Delight Stare Treating Lavonya Hoerner/Extender: Skipper Cliche in Treatment: 5 Visit Information History Since Last Visit Added or deleted any medications: No Patient Arrived: Ambulatory Has Dressing in Place as Prescribed: Yes Arrival Time: 08:47 Has Compression in Place as Prescribed: Yes Accompanied By: self Pain Present Now: Yes Transfer Assistance: None Patient Identification Verified: Yes Secondary Verification Process Completed: Yes Patient Has Alerts: Yes Patient Alerts: Patient on Blood Thinner Aspirin 47m AVVS ABI/TBI 7/22 ABI L- 1.15/ R- 1.08 Electronic Signature(s) Signed: 10/03/2020 4:05:19 PM By: WGretta Cool BSN, RN, CWS, Kim RN, BSN Entered By: WGretta Cool BSN, RN, CWS, Kim on 10/03/2020 08:47:36 Pelfrey, Amberle M. ([id number] -------------------------------------------------------------------------------- Encounter Discharge Information Details Patient Name: Janet [name] Date of Service: 10/03/2020 8:30 AM Medical Record Number: [id number] Account Number: [id number] of Birth/Sex: 206/24/1952(70y.o. F) Treating RN: WCornell BarmanPrimary Care Sumayah Bearse: MDelight StareOther Clinician: Referring Khai Torbert: MDelight StareTreating Sharrell Krawiec/Extender: SSkipper Clichein Treatment: 5 Encounter Discharge Information Items Discharge Condition: Stable Ambulatory Status: Ambulatory Discharge Destination: Home Transportation: Private Auto Accompanied By: self Schedule Follow-up Appointment: Yes Clinical Summary of Care: Electronic Signature(s) Signed: 10/03/2020 4:05:19 PM By: WGretta Cool BSN, RN, CWS, Kim RN, BSN Entered By: WGretta Cool  BSN, RN, CWS, Kim on 10/03/2020 09:29:19 Janet [name]([id number] -------------------------------------------------------------------------------- Lower Extremity Assessment Details Patient Name: Janet [name], Janet [name] Date of Service: 10/03/2020 8:30 AM Medical Record Number: [id number] Account Number: [id number] of Birth/Sex: 206-06-1950(70y.o. F) Treating RN: WCornell BarmanPrimary Care Kayston Jodoin: MDelight StareOther Clinician: Referring Tyteanna Ost: MDelight StareTreating Alverto Shedd/Extender: SSkipper Clichein Treatment: 5 Edema Assessment Assessed: [Left: No] [Right: No] [Left: Edema] [Right: :] Calf Left: Right: Point of Measurement: 29 cm From Medial Instep 37.2 cm Ankle Left: Right: Point of Measurement: 10 cm From Medial Instep 24.5 cm Knee To Floor Left: Right: From Medial Instep 41 cm Vascular Assessment Pulses: Dorsalis Pedis Palpable: [Left:Yes] Posterior Tibial Palpable: [Left:Yes] Electronic Signature(s) Signed: 10/03/2020 4:05:19 PM By: WGretta Cool BSN, RN, CWS, Kim RN, BSN Entered By: WGretta Cool BSN, RN, CWS, Kim on 10/03/2020 09:03:12 [place] Ray[place] M. ([id number] -------------------------------------------------------------------------------- Multi Wound Chart Details Patient Name: Janet [name] Date of Service: 10/03/2020 8:30 AM Medical Record Number: [id number] Account Number: [id number] of Birth/Sex: [birth year](70y.o. F) Treating RN: WCornell BarmanPrimary Care Ronnesha Mester: MDelight StareOther Clinician: Referring Oliverio Cho: MDelight StareTreating Traci Gafford/Extender: SSkipper Clichein Treatment: 5 Vital Signs Height(in): 623Pulse(bpm): 722Weight(lbs): 192 Blood Pressure(mmHg): 134/80 Body Mass Index(BMI): 31 Temperature(F): 98.5 Respiratory Rate(breaths/min): 16 Photos: [N/A:N/A] Wound Location: Left, Distal, Anterior Lower Leg Left, Proximal, Anterior Lower Leg N/A Wounding Event: Blister Blister N/A Primary Etiology: Venous Leg Ulcer Venous Leg  Ulcer N/A Secondary Etiology: Lymphedema Lymphedema N/A Comorbid History: Asthma, Hypertension Asthma, Hypertension N/A Date Acquired: 05/15/2020 05/15/2020 N/A Weeks of Treatment: 5 5 N/A Wound Status: Open Open N/A Measurements L x W x D (cm) 4.5x2.7x0.2 2.4x1.7x0.1 N/A Area (cm) : 9.543 3.204 N/A Volume (cm) : 1.909 0.32 N/A % Reduction in Area: -59.90% -172.00% N/A % Reduction in Volume: -219.80% -171.20% N/A Classification: Full Thickness Without Exposed Full Thickness Without Exposed N/A Support Structures Support Structures Exudate Amount: Medium Medium N/A Exudate Type: Sanguinous Serosanguineous  N/A Exudate Color: red red, brown N/A Wound Margin: N/A Flat and Intact N/A Granulation Amount: Medium (34-66%) Medium (34-66%) N/A Granulation Quality: Red Red, Hyper-granulation N/A Necrotic Amount: Medium (34-66%) Medium (34-66%) N/A Exposed Structures: Fat Layer (Subcutaneous Tissue): Fat Layer (Subcutaneous Tissue): N/A Yes Yes Fascia: No Fascia: No Tendon: No Tendon: No Muscle: No Muscle: No Joint: No Joint: No Bone: No Bone: No Epithelialization: None None N/A Treatment Notes Electronic Signature(s) Signed: 10/03/2020 4:05:19 PM By: Gretta Cool, BSN, RN, CWS, Kim RN, BSN Entered By: Gretta Cool, BSN, RN, CWS, Kim on 10/03/2020 09:03:53 Janet [name], [name] ([id number]) -------------------------------------------------------------------------------- Multi-Disciplinary Care Plan Details Patient Name: Janet [name]. Date of Service: 10/03/2020 8:30 AM Medical Record Number: [id number] Patient Account Number: [id number] Date of Birth/Sex: March 29, 1950 (70 y.o. F) Treating RN: Cornell Barman Primary Care Jb Dulworth: Delight Stare Other Clinician: Referring Deziree Mokry: Delight Stare Treating Syncere Kaminski/Extender: Skipper Cliche in Treatment: 5 Active Inactive Wound/Skin Impairment Nursing Diagnoses: Impaired tissue integrity Knowledge deficit related to smoking impact on wound healing Knowledge  deficit related to ulceration/compromised skin integrity Goals: Patient/caregiver will verbalize understanding of skin care regimen Date Initiated: 08/29/2020 Date Inactivated: 09/20/2020 Target Resolution Date: 09/14/2020 Goal Status: Met Ulcer/skin breakdown will have a volume reduction of 30% by week 4 Date Initiated: 08/29/2020 Target Resolution Date: 09/29/2020 Goal Status: Active Ulcer/skin breakdown will have a volume reduction of 50% by week 8 Date Initiated: 08/29/2020 Target Resolution Date: 10/29/2020 Goal Status: Active Ulcer/skin breakdown will have a volume reduction of 80% by week 12 Date Initiated: 08/29/2020 Target Resolution Date: 11/29/2020 Goal Status: Active Ulcer/skin breakdown will heal within 14 weeks Date Initiated: 08/29/2020 Target Resolution Date: 12/14/2020 Goal Status: Active Interventions: Assess patient/caregiver ability to obtain necessary supplies Assess patient/caregiver ability to perform ulcer/skin care regimen upon admission and as needed Assess ulceration(s) every visit Provide education on ulcer and skin care Notes: Electronic Signature(s) Signed: 10/03/2020 4:05:19 PM By: Gretta Cool, BSN, RN, CWS, Kim RN, BSN Entered By: Gretta Cool, BSN, RN, CWS, Kim on 10/03/2020 09:03:22 Janet [name], [place]. ([id number]) -------------------------------------------------------------------------------- Pain Assessment Details Patient Name: Janet [name]. Date of Service: 10/03/2020 8:30 AM Medical Record Number: [id number] Patient Account Number: [id number] Date of Birth/Sex: Jun 18, 1950 (70 y.o. F) Treating RN: Cornell Barman Primary Care Arianis Bowditch: Delight Stare Other Clinician: Referring Sibley Rolison: Delight Stare Treating Teniyah Seivert/Extender: Skipper Cliche in Treatment: 5 Active Problems Location of Pain Severity and Description of Pain Patient Has Paino Yes Site Locations Pain Location: Generalized Pain Rate the pain. Current Pain Level: 7 Pain Management and  Medication Current Pain Management: Electronic Signature(s) Signed: 10/03/2020 4:05:19 PM By: Gretta Cool, BSN, RN, CWS, Kim RN, BSN Entered By: Gretta Cool, BSN, RN, CWS, Kim on 10/03/2020 08:48:43 Janet [name] ([id number]) -------------------------------------------------------------------------------- Patient/Caregiver Education Details Patient Name: Janet [name]. Date of Service: 10/03/2020 8:30 AM Medical Record Number: [id number] Patient Account Number: [id number] Date of Birth/Gender: 10-17-1950 (70 y.o. F) Treating RN: Cornell Barman Primary Care Physician: Delight Stare Other Clinician: Referring Physician: Delight Stare Treating Physician/Extender: Skipper Cliche in Treatment: 5 Education Assessment Education Provided To: Patient Education Topics Provided Venous: Handouts: Controlling Swelling with Multilayered Compression Wraps Methods: Demonstration, Explain/Verbal Responses: State content correctly Wound Debridement: Handouts: Other: explained importance of debridement and removing the dead tissue to promote healing Methods: Explain/Verbal Responses: Reinforcements needed Wound/Skin Impairment: Handouts: Caring for Your Ulcer Methods: Demonstration, Explain/Verbal Responses: State content correctly Electronic Signature(s) Signed: 10/03/2020 4:05:19 PM By: Gretta Cool, BSN, RN, CWS, Kim RN, BSN Entered By: Gretta Cool, BSN, RN, CWS, Kim on 10/03/2020 09:14:12 Janet [name],  [name] ([id number]) -------------------------------------------------------------------------------- Wound Assessment Details Patient Name: MERIDA, ALCANTAR. Date of Service: 10/03/2020 8:30 AM Medical Record Number: [id number] Patient Account Number: [id number] Date of Birth/Sex: [birth year] (70 y.o. F) Treating RN: Cornell Barman Primary Care Yulianna Folse: Delight Stare Other Clinician: Referring Kharee Lesesne: Delight Stare Treating Denaisha Swango/Extender: Skipper Cliche in Treatment: 5 Wound Status Wound Number: 1 Primary Etiology: Venous  Leg Ulcer Wound Location: Left, Distal, Anterior Lower Leg Secondary Etiology: Lymphedema Wounding Event: Blister Wound Status: Open Date Acquired: 05/15/2020 Comorbid History: Asthma, Hypertension Weeks Of Treatment: 5 Clustered Wound: No Photos Wound Measurements Length: (cm) 4.5 Width: (cm) 2.7 Depth: (cm) 0.2 Area: (cm) 9.543 Volume: (cm) 1.909 % Reduction in Area: -59.9% % Reduction in Volume: -219.8% Epithelialization: None Tunneling: No Undermining: No Wound Description Classification: Full Thickness Without Exposed Support Structu Exudate Amount: Medium Exudate Type: Sanguinous Exudate Color: red res Foul Odor After Cleansing: No Slough/Fibrino Yes Wound Bed Granulation Amount: Medium (34-66%) Exposed Structure Granulation Quality: Red Fascia Exposed: No Necrotic Amount: Medium (34-66%) Fat Layer (Subcutaneous Tissue) Exposed: Yes Necrotic Quality: Adherent Slough Tendon Exposed: No Muscle Exposed: No Joint Exposed: No Bone Exposed: No Treatment Notes Wound #1 (Lower Leg) Wound Laterality: Left, Anterior, Distal Cleanser Peri-Wound Care Triamcinolone Acetonide Cream, 0.1%, 15 (g) tube Topical Triamcinolone Acetonide Cream, 0.1%, 15 (g) tube Janet [name], [name] ([id number]) Discharge Instruction: Apply as directed by Winthrop Shannahan. Primary Dressing Hydrofera Blue Ready Transfer Foam, 2.5x2.5 (in/in) Discharge Instruction: Apply Hydrofera Blue Ready to wound bed as directed Secondary Dressing Secured With Compression Wrap Profore Lite LF 3 Multilayer Compression Bandaging System Discharge Instruction: Apply 3 multi-layer wrap as prescribed. Compression Stockings Environmental education officer) Signed: 10/03/2020 4:05:19 PM By: Gretta Cool, BSN, RN, CWS, Kim RN, BSN Entered By: Gretta Cool, BSN, RN, CWS, Kim on 10/03/2020 09:00:47 Janet [name] ([id number]) -------------------------------------------------------------------------------- Wound Assessment Details Patient  Name: Janet [name]. Date of Service: 10/03/2020 8:30 AM Medical Record Number: [id number] Patient Account Number: [id number] Date of Birth/Sex: 11/13/1950 (70 y.o. F) Treating RN: Cornell Barman Primary Care Jaion Lagrange: Delight Stare Other Clinician: Referring Cherrell Maybee: Delight Stare Treating Jawuan Robb/Extender: Skipper Cliche in Treatment: 5 Wound Status Wound Number: 2 Primary Etiology: Venous Leg Ulcer Wound Location: Left, Proximal, Anterior Lower Leg Secondary Etiology: Lymphedema Wounding Event: Blister Wound Status: Open Date Acquired: 05/15/2020 Comorbid History: Asthma, Hypertension Weeks Of Treatment: 5 Clustered Wound: No Photos Wound Measurements Length: (cm) 2.4 Width: (cm) 1.7 Depth: (cm) 0.1 Area: (cm) 3.204 Volume: (cm) 0.32 % Reduction in Area: -172% % Reduction in Volume: -171.2% Epithelialization: None Tunneling: No Undermining: No Wound Description Classification: Full Thickness Without Exposed Support Structu Wound Margin: Flat and Intact Exudate Amount: Medium Exudate Type: Serosanguineous Exudate Color: red, brown res Foul Odor After Cleansing: No Slough/Fibrino Yes Wound Bed Granulation Amount: Medium (34-66%) Exposed Structure Granulation Quality: Red, Hyper-granulation Fascia Exposed: No Necrotic Amount: Medium (34-66%) Fat Layer (Subcutaneous Tissue) Exposed: Yes Necrotic Quality: Adherent Slough Tendon Exposed: No Muscle Exposed: No Joint Exposed: No Bone Exposed: No Treatment Notes Wound #2 (Lower Leg) Wound Laterality: Left, Anterior, Proximal Cleanser Peri-Wound Care Triamcinolone Acetonide Cream, 0.1%, 15 (g) tube Topical Janet [name], [name] ([id number]) Triamcinolone Acetonide Cream, 0.1%, 15 (g) tube Discharge Instruction: Apply as directed by Kalyn Hofstra. Primary Dressing Hydrofera Blue Ready Transfer Foam, 2.5x2.5 (in/in) Discharge Instruction: Apply Hydrofera Blue Ready to wound bed as directed Secondary Dressing Secured  With Compression Wrap Profore Lite LF 3 Multilayer Compression Bandaging System Discharge Instruction: Apply 3 multi-layer wrap as prescribed. Compression Stockings Environmental education officer) Signed:  10/03/2020 4:05:19 PM By: Gretta Cool, BSN, RN, CWS, Kim RN, BSN Entered By: Gretta Cool, BSN, RN, CWS, Kim on 10/03/2020 09:01:24 Janet [name] ([id number]) -------------------------------------------------------------------------------- Vitals Details Patient Name: Janet [name]. Date of Service: 10/03/2020 8:30 AM Medical Record Number: [id number] Patient Account Number: [id number] Date of Birth/Sex: 1950-04-29 (70 y.o. F) Treating RN: Cornell Barman Primary Care Lucill Mauck: Delight Stare Other Clinician: Referring Crewe Heathman: Delight Stare Treating Jaicob Dia/Extender: Skipper Cliche in Treatment: 5 Vital Signs Time Taken: 08:47 Temperature (F): 98.5 Height (in): 66 Pulse (bpm): 73 Weight (lbs): 192 Respiratory Rate (breaths/min): 16 Body Mass Index (BMI): 31 Blood Pressure (mmHg): 134/80 Reference Range: 80 - 120 mg / dl Electronic Signature(s) Signed: 10/03/2020 4:05:19 PM By: Gretta Cool, BSN, RN, CWS, Kim RN, BSN Entered By: Gretta Cool, BSN, RN, CWS, Kim on 10/03/2020 08:48:10

## 2020-10-03 NOTE — Progress Notes (Addendum)
Janet [name], Janet [name] ([id number]) Visit Report for 10/03/2020 Chief Complaint Document Details Patient Name: Janet [name], Janet [name]. Date of Service: 10/03/2020 8:30 AM Medical Record Number: [id number] Patient Account Number: [id number] Date of Birth/Sex: [birth year] (70 y.o. F) Treating RN: Donnamarie Poag Primary Care Provider: Delight Stare Other Clinician: Referring Provider: Delight Stare Treating Provider/Extender: Skipper Cliche in Treatment: 5 Information Obtained from: Patient Chief Complaint Left LE Ulcers Electronic Signature(s) Signed: 10/03/2020 9:05:22 AM By: Worthy Keeler PA-C Entered By: Worthy Keeler on 10/03/2020 09:05:22 [place]ton, Nasteho M. ([id number]) -------------------------------------------------------------------------------- HPI Details Patient Name: Janet [name]. Date of Service: 10/03/2020 8:30 AM Medical Record Number: [id number] Patient Account Number: [id number] Date of Birth/Sex: 22-Mar-1950 (70 y.o. F) Treating RN: Donnamarie Poag Primary Care Provider: Delight Stare Other Clinician: Referring Provider: Delight Stare Treating Provider/Extender: Skipper Cliche in Treatment: 5 History of Present Illness HPI Description: 08/29/2020 upon evaluation today patient appears to be doing somewhat poorly in regard to what appeared to be venous leg ulcers on the left leg that she has been dealing with since around the beginning of May. Fortunately she does not have any signs of obvious infection unfortunately she does have quite a bit of necrotic tissue that does not really need to be debrided. She has been seeing vascular and they did wrap her for a time with what was likely an Haematologist. With that being said the [facility] boot has given her some trouble as far as feeling claustrophobic was concerned she is not sure if she is good to be able to tolerate a compression wrap currently although we will get a try she is in agreement with Aleve given this shot. Fortunately I do not see any signs of  active infection systemically which is great news and even locally she seems to be doing quite well today. Patient's Right ABI is 1.08 with a TBI of 0.79 and the left ABI is 1.15 with a TBI of 0.84 on 08/03/20. She also has a history of chronic venous insufficiency and lymphedema but otherwise no major medical problems. 09/05/2020 upon evaluation today patient unfortunately appears to be having some issues today with her right leg this is completely different from what we have been taking care of on the left. She actually has some swelling and an area of erythema and pain which is in the medial calf region. There is no identifiable abscess definitely this could just be cellulitis but with her history of DVT I think that we need to have this checked out. 09/12/2020 upon evaluation today patient appears to be doing better in regard to her wound. She has been tolerating the dressing changes without complication. Fortunately there does not appear to be any signs of active infection which is great news and overall I think that the patient is doing well in this regard. Her wounds are measuring smaller at both locations and the surface of the wound is looking better. With that being said she does have a wedding that she is supposed to be going to this weekend and states that she really needs to not have the wrap on for that. Nonetheless regular and discuss options to try to help maintain while she has this time with the wedding. 09/20/2020 upon evaluation today patient unfortunately is doing worse in regard to the pain associated with her wound at this time. Fortunately there does not appear to be any evidence of active infection systemically which is great news but in general I feel like locally she may  definitely be infected. Subsequently I do believe that we need to address this as soon as possible. 09/26/2020 upon evaluation today patient appears to be doing well with regard to her wound. Fortunately there  does not appear to be any signs of worsening in general. I think that overall the patient is making wonderful progress in my opinion. The right leg where she had an area of erythema medially also is better today which is great news. I think Levaquin has done a great job in that regard. We did see evidence still some erythema on the right leg but again this is significantly improved compared to last week. Overall the Levaquin has done a great job in this regard. 10/03/2020 upon evaluation today patient appears to be doing well all things considered with regard to her wounds. There does not appear to be any signs of infection obvious at this point which is great news. Fortunately I think that she is definitely making progress with regard to the appearance of the wound bed but nonetheless is still having quite a bit of an issue here with the pain aspect. Fortunately I think that we have some other options to consider him and use triamcinolone underneath the [facility] today. Also think looking into PuraPly/Apligraf as a treatment could be appropriate for this patient and my hope is that we will be able to get the wounds growing in a much faster pace as far as new tissue is concerned to get this closed sooner rather than later as that is the only way that we will get a get the pain completely resolved for her which is ultimately the goal we have as well obviously. Electronic Signature(s) Signed: 10/03/2020 9:16:10 AM By: Worthy Keeler PA-C Entered By: Worthy Keeler on 10/03/2020 09:16:10 Janet [name], [name] ([id number]) -------------------------------------------------------------------------------- Physical Exam Details Patient Name: Janet [name]. Date of Service: 10/03/2020 8:30 AM Medical Record Number: [id number] Patient Account Number: [id number] Date of Birth/Sex: 1950-06-16 (70 y.o. F) Treating RN: Donnamarie Poag Primary Care Provider: Delight Stare Other Clinician: Referring Provider: Delight Stare Treating Provider/Extender: Skipper Cliche in Treatment: 5 Constitutional Well-nourished and well-hydrated in no acute distress. Respiratory normal breathing without difficulty. Psychiatric this patient is able to make decisions and demonstrates good insight into disease process. Alert and Oriented x 3. pleasant and cooperative. Notes Upon inspection patient's wound bed actually showed signs of some slough noted on the surface of the wound she really did not want me to do anything debridement wise today. Again I do believe that something that we will get a need to do at some point soon especially if we get the approval for PuraPly we will get have to proceed as such. Otherwise for now I Georgina Peer give her a break this week we will use the triamcinolone and see if this will calm down some of the pain as well. Electronic Signature(s) Signed: 10/03/2020 9:16:38 AM By: Worthy Keeler PA-C Entered By: Worthy Keeler on 10/03/2020 09:16:38 Janet [name], [name] ([id number]) -------------------------------------------------------------------------------- Physician Orders Details Patient Name: Janet [name]. Date of Service: 10/03/2020 8:30 AM Medical Record Number: [id number] Patient Account Number: [id number] Date of Birth/Sex: December 16, 1950 (70 y.o. F) Treating RN: Cornell Barman Primary Care Provider: Delight Stare Other Clinician: Referring Provider: Delight Stare Treating Provider/Extender: Skipper Cliche in Treatment: 5 Verbal / Phone Orders: No Diagnosis Coding ICD-10 Coding Code Description I87.2 Venous insufficiency (chronic) (peripheral) I89.0 Lymphedema, not elsewhere classified 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 of other part of left  lower leg with fat layer exposed L03.115 Cellulitis of right lower limb R22.41 Localized swelling, mass and lump, right lower limb Follow-up Appointments o Return Appointment in 1 week. o Nurse Visit as needed Bathing/ Shower/ Hygiene o May  shower with wound dressing protected with water repellent cover or cast protector. o No tub bath. Edema Control - Lymphedema / Segmental Compressive Device / Other o Optional: One layer of unna paste to top of compression wrap (to act as an anchor). - left leg o Patient to wear own compression stockings. Remove compression stockings every night before going to bed and put on every morning when getting up. - right leg-see Elastic Therapy contact and measurements o Patient to wear own Velcro compression garment. Remove compression stockings every night before going to bed and put on every morning when getting up. - will be mailed to you, "JUXTELITE" o Elevate legs to the level of the heart and pump ankles as often as possible o Elevate leg(s) parallel to the floor when sitting. o DO YOUR BEST to sleep in the bed at night. DO NOT sleep in your recliner. Long hours of sitting in a recliner leads to swelling of the legs and/or potential wounds on your backside. Medications-Please add to medication list. o Take one '500mg'$  Tylenol (Acetaminophen) and one '200mg'$  Motrin (Ibuprofen) every 6 hours for pain. Do not take ibuprofen if you are on blood thinners or have stomach ulcers. - as needed for pain if not contraindicated by other physician Wound Treatment Wound #1 - Lower Leg Wound Laterality: Left, Anterior, Distal Peri-Wound Care: Triamcinolone Acetonide Cream, 0.1%, 15 (g) tube Topical: Triamcinolone Acetonide Cream, 0.1%, 15 (g) tube Discharge Instructions: Apply as directed by provider. Primary Dressing: Hydrofera Blue Ready Transfer Foam, 2.5x2.5 (in/in) Discharge Instructions: Apply Hydrofera Blue Ready to wound bed as directed Compression Wrap: Profore Lite LF 3 Multilayer Compression Bandaging System Discharge Instructions: Apply 3 multi-layer wrap as prescribed. Wound #2 - Lower Leg Wound Laterality: Left, Anterior, Proximal Peri-Wound Care: Triamcinolone Acetonide Cream,  0.1%, 15 (g) tube Topical: Triamcinolone Acetonide Cream, 0.1%, 15 (g) tube Discharge Instructions: Apply as directed by provider. Primary Dressing: Hydrofera Blue Ready Transfer Foam, 2.5x2.5 (in/in) Discharge Instructions: Apply Hydrofera Blue Ready to wound bed as directed Compression Wrap: Profore Lite LF 3 Multilayer Compression [place], Aldina M. ([id number]) Discharge Instructions: Apply 3 multi-layer wrap as prescribed. Notes Authorization for Puraply and Apligraf Electronic Signature(s) Signed: 10/03/2020 4:05:19 PM By: Gretta Cool, BSN, RN, CWS, Kim RN, BSN Signed: 10/03/2020 6:26:07 PM By: Worthy Keeler PA-C Entered By: Gretta Cool, BSN, RN, CWS, Kim on 10/03/2020 09:12:39 Janet [name], Janet [name] Kitchen ([id number]) -------------------------------------------------------------------------------- Problem List Details Patient Name: MARIGRACE, BEATRICE. Date of Service: 10/03/2020 8:30 AM Medical Record Number: [id number] Patient Account Number: [id number] Date of Birth/Sex: 1950-02-11 (70 y.o. F) Treating RN: Donnamarie Poag Primary Care Provider: Delight Stare Other Clinician: Referring Provider: Delight Stare Treating Provider/Extender: Skipper Cliche in Treatment: 5 Active Problems ICD-10 Encounter Code Description Active Date MDM Diagnosis I87.2 Venous insufficiency (chronic) (peripheral) 08/29/2020 No Yes I89.0 Lymphedema, not elsewhere classified 08/29/2020 No Yes 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 of other part of left lower leg with fat layer 08/29/2020 No Yes exposed L03.115 Cellulitis of right lower limb 09/05/2020 No Yes R22.41 Localized swelling, mass and lump, right lower limb 09/05/2020 No Yes Inactive Problems Resolved Problems Electronic Signature(s) Signed: 10/03/2020 9:05:15 AM By: Worthy Keeler PA-C Entered By: Worthy Keeler on 10/03/2020 09:05:15 Janet [name], [name] ([id number]) -------------------------------------------------------------------------------- Progress Note  Details Patient Name: Janet [name], Janet [name]. Date of Service: 10/03/2020 8:30 AM Medical Record Number: [id number] Patient Account Number: [id number] Date of Birth/Sex: 09/27/1950 (70 y.o. F) Treating RN: Donnamarie Poag Primary Care Provider: Delight Stare Other Clinician: Referring Provider: Delight Stare Treating Provider/Extender: Skipper Cliche in Treatment: 5 Subjective Chief Complaint Information obtained from Patient Left LE Ulcers History of Present Illness (HPI) 08/29/2020 upon evaluation today patient appears to be doing somewhat poorly in regard to what appeared to be venous leg ulcers on the left leg that she has been dealing with since around the beginning of May. Fortunately she does not have any signs of obvious infection unfortunately she does have quite a bit of necrotic tissue that does not really need to be debrided. She has been seeing vascular and they did wrap her for a time with what was likely an Haematologist. With that being said the [facility] boot has given her some trouble as far as feeling claustrophobic was concerned she is not sure if she is good to be able to tolerate a compression wrap currently although we will get a try she is in agreement with Aleve given this shot. Fortunately I do not see any signs of active infection systemically which is great news and even locally she seems to be doing quite well today. Patient's Right ABI is 1.08 with a TBI of 0.79 and the left ABI is 1.15 with a TBI of 0.84 on 08/03/20. She also has a history of chronic venous insufficiency and lymphedema but otherwise no major medical problems. 09/05/2020 upon evaluation today patient unfortunately appears to be having some issues today with her right leg this is completely different from what we have been taking care of on the left. She actually has some swelling and an area of erythema and pain which is in the medial calf region. There is no identifiable abscess definitely this could just be cellulitis  but with her history of DVT I think that we need to have this checked out. 09/12/2020 upon evaluation today patient appears to be doing better in regard to her wound. She has been tolerating the dressing changes without complication. Fortunately there does not appear to be any signs of active infection which is great news and overall I think that the patient is doing well in this regard. Her wounds are measuring smaller at both locations and the surface of the wound is looking better. With that being said she does have a wedding that she is supposed to be going to this weekend and states that she really needs to not have the wrap on for that. Nonetheless regular and discuss options to try to help maintain while she has this time with the wedding. 09/20/2020 upon evaluation today patient unfortunately is doing worse in regard to the pain associated with her wound at this time. Fortunately there does not appear to be any evidence of active infection systemically which is great news but in general I feel like locally she may definitely be infected. Subsequently I do believe that we need to address this as soon as possible. 09/26/2020 upon evaluation today patient appears to be doing well with regard to her wound. Fortunately there does not appear to be any signs of worsening in general. I think that overall the patient is making wonderful progress in my opinion. The right leg where she had an area of erythema medially also is better today which is great news. I think Levaquin has done a great job in  that regard. We did see evidence still some erythema on the right leg but again this is significantly improved compared to last week. Overall the Levaquin has done a great job in this regard. 10/03/2020 upon evaluation today patient appears to be doing well all things considered with regard to her wounds. There does not appear to be any signs of infection obvious at this point which is great news. Fortunately I  think that she is definitely making progress with regard to the appearance of the wound bed but nonetheless is still having quite a bit of an issue here with the pain aspect. Fortunately I think that we have some other options to consider him and use triamcinolone underneath the [facility] today. Also think looking into PuraPly/Apligraf as a treatment could be appropriate for this patient and my hope is that we will be able to get the wounds growing in a much faster pace as far as new tissue is concerned to get this closed sooner rather than later as that is the only way that we will get a get the pain completely resolved for her which is ultimately the goal we have as well obviously. Objective Constitutional Well-nourished and well-hydrated in no acute distress. Vitals Time Taken: 8:47 AM, Height: 66 in, Weight: 192 lbs, BMI: 31, Temperature: 98.5 F, Pulse: 73 bpm, Respiratory Rate: 16 breaths/min, Blood Pressure: 134/80 mmHg. Respiratory normal breathing without difficulty. Janet [name], Janet [name] ([id number]) Psychiatric this patient is able to make decisions and demonstrates good insight into disease process. Alert and Oriented x 3. pleasant and cooperative. General Notes: Upon inspection patient's wound bed actually showed signs of some slough noted on the surface of the wound she really did not want me to do anything debridement wise today. Again I do believe that something that we will get a need to do at some point soon especially if we get the approval for PuraPly we will get have to proceed as such. Otherwise for now I Georgina Peer give her a break this week we will use the triamcinolone and see if this will calm down some of the pain as well. Integumentary (Hair, Skin) Wound #1 status is Open. Original cause of wound was Blister. The date acquired was: 05/15/2020. The wound has been in treatment 5 weeks. The wound is located on the [facility] Lower Leg. The wound measures 4.5cm length x  2.7cm width x 0.2cm depth; 9.543cm^2 area and 1.909cm^3 volume. There is Fat Layer (Subcutaneous Tissue) exposed. There is no tunneling or undermining noted. There is a medium amount of sanguinous drainage noted. There is medium (34-66%) red granulation within the wound bed. There is a medium (34-66%) amount of necrotic tissue within the wound bed including Adherent Slough. Wound #2 status is Open. Original cause of wound was Blister. The date acquired was: 05/15/2020. The wound has been in treatment 5 weeks. The wound is located on the Left,Proximal,Anterior Lower Leg. The wound measures 2.4cm length x 1.7cm width x 0.1cm depth; 3.204cm^2 area and 0.32cm^3 volume. There is Fat Layer (Subcutaneous Tissue) exposed. There is no tunneling or undermining noted. There is a medium amount of serosanguineous drainage noted. The wound margin is flat and intact. There is medium (34-66%) red, hyper - granulation within the wound bed. There is a medium (34-66%) amount of necrotic tissue within the wound bed including Adherent Slough. Assessment Active Problems ICD-10 Venous insufficiency (chronic) (peripheral) Lymphedema, not elsewhere classified Non-pressure chronic ulcer of other part of left lower leg with fat layer exposed Cellulitis  of right lower limb Localized swelling, mass and lump, right lower limb Plan Follow-up Appointments: Return Appointment in 1 week. Nurse Visit as needed Bathing/ Shower/ Hygiene: May shower with wound dressing protected with water repellent cover or cast protector. No tub bath. Edema Control - Lymphedema / Segmental Compressive Device / Other: Optional: One layer of unna paste to top of compression wrap (to act as an anchor). - left leg Patient to wear own compression stockings. Remove compression stockings every night before going to bed and put on every morning when getting up. - right leg-see Elastic Therapy contact and measurements Patient to wear own Velcro  compression garment. Remove compression stockings every night before going to bed and put on every morning when getting up. - will be mailed to you, "JUXTELITE" Elevate legs to the level of the heart and pump ankles as often as possible Elevate leg(s) parallel to the floor when sitting. DO YOUR BEST to sleep in the bed at night. DO NOT sleep in your recliner. Long hours of sitting in a recliner leads to swelling of the legs and/or potential wounds on your backside. Medications-Please add to medication list.: Take one '500mg'$  Tylenol (Acetaminophen) and one '200mg'$  Motrin (Ibuprofen) every 6 hours for pain. Do not take ibuprofen if you are on blood thinners or have stomach ulcers. - as needed for pain if not contraindicated by other physician General Notes: Authorization for Puraply and Apligraf WOUND #1: - Lower Leg Wound Laterality: Left, Anterior, Distal Peri-Wound Care: Triamcinolone Acetonide Cream, 0.1%, 15 (g) tube Topical: Triamcinolone Acetonide Cream, 0.1%, 15 (g) tube Discharge Instructions: Apply as directed by provider. Primary Dressing: Hydrofera Blue Ready Transfer Foam, 2.5x2.5 (in/in) Discharge Instructions: Apply Hydrofera Blue Ready to wound bed as directed Compression Wrap: Profore Lite LF 3 Multilayer Compression Bandaging System Discharge Instructions: Apply 3 multi-layer wrap as prescribed. WOUND #2: - Lower Leg Wound Laterality: Left, Anterior, Proximal Peri-Wound Care: Triamcinolone Acetonide Cream, 0.1%, 15 (g) tube Topical: Triamcinolone Acetonide Cream, 0.1%, 15 (g) tube Discharge Instructions: Apply as directed by provider. Janet [name], Janet [name] ([id number]) Primary Dressing: Chrys Racer Ready Transfer Foam, 2.5x2.5 (in/in) Discharge Instructions: Apply Hydrofera Blue Ready to wound bed as directed Compression Wrap: Profore Lite LF 3 Multilayer Compression Bandaging System Discharge Instructions: Apply 3 multi-layer wrap as prescribed. 1. Would recommend currently that  we going continue with the wound care measures as before and the patient is in agreement with plan. This includes the use of the [facility] dressing. We are going to apply triamcinolone underneath. 2. I am also can recommend that we have the patient continue with the compression wrapping. Right now I think that she is doing well as far as keeping the edema under good control. Nonetheless I do believe that this is definitely something that appears to be helpful for her but still we really need something to jumpstart things as this wound really is not progressing on the level that we would like to see that is the reason I am going to proceed with looking into PuraPly/Apligraf for her. My initial plan would be to go with PuraPly initially to prepare the surface of the wound and then transition to Apligraf. We will see patient back for reevaluation in 1 week here in the clinic. If anything worsens or changes patient will contact our office for additional recommendations. Electronic Signature(s) Signed: 10/03/2020 9:18:10 AM By: Worthy Keeler PA-C Entered By: Worthy Keeler on 10/03/2020 09:18:10 Janet [name], [name] ([id number]) -------------------------------------------------------------------------------- SuperBill Details Patient Name:  Janet [name], [name] Date of Service: 10/03/2020 Medical Record Number: [id number] Patient Account Number: [id number] Date of Birth/Sex: 29-Sep-1950 (71 y.o. F) Treating RN: Cornell Barman Primary Care Provider: Delight Stare Other Clinician: Referring Provider: Delight Stare Treating Provider/Extender: Skipper Cliche in Treatment: 5 Diagnosis Coding ICD-10 Codes Code Description I87.2 Venous insufficiency (chronic) (peripheral) I89.0 Lymphedema, not elsewhere classified 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 of other part of left lower leg with fat layer exposed L03.115 Cellulitis of right lower limb R22.41 Localized swelling, mass and lump, right lower limb Facility  Procedures CPT4 Code: [id number] Description: (Facility Use Only) [phone] - [place] [id number] LWR LT LEG Modifier: Quantity: 1 Physician Procedures CPT4 [id number] Description: [id number] - WC PHYS LEVEL 4 - EST PT Modifier: Quantity: 1 CPT4 Code: Description: ICD-10 Diagnosis Description I87.2 Venous insufficiency (chronic) (peripheral) I89.0 Lymphedema, not elsewhere classified 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 of other part of left lower leg with fat lay L03.115 Cellulitis of right lower limb Modifier: er exposed Quantity: Electronic Signature(s) Signed: 10/03/2020 9:19:32 AM By: Worthy Keeler PA-C Entered By: Worthy Keeler on 10/03/2020 09:19:32

## 2020-10-10 ENCOUNTER — Encounter: Payer: Medicare Other | Admitting: Internal Medicine

## 2020-10-10 ENCOUNTER — Other Ambulatory Visit: Payer: Self-pay

## 2020-10-10 DIAGNOSIS — L97822 Non-pressure chronic ulcer of other part of left lower leg with fat layer exposed: Secondary | ICD-10-CM | POA: Diagnosis not present

## 2020-10-10 NOTE — Progress Notes (Signed)
Janet, [name] ([id number]) Visit Report for 10/10/2020 HPI Details Patient Name: Janet [name], Janet [name]. Date of Service: 10/10/2020 8:30 AM Medical Record Number: [id number] Patient Account Number: [id number] Date of Birth/Sex: 09/10/1950 (70 y.o. F) Treating RN: Janet [name] Primary Care Provider: Delight [name] Other Clinician: Referring Provider: Delight [name] Treating Provider/Extender: Janet [name] in Treatment: 6 History of Present Illness HPI Description: 08/29/2020 upon evaluation today patient appears to be doing somewhat poorly in regard to what appeared to be venous leg ulcers on the left leg that she has been dealing with since around the beginning of May. Fortunately she does not have any signs of obvious infection unfortunately she does have quite a bit of necrotic tissue that does not really need to be debrided. She has been seeing vascular and they did wrap her for a time with what was likely an Haematologist. With that being said the [facility] boot has given her some trouble as far as feeling claustrophobic was concerned she is not sure if she is good to be able to tolerate a compression wrap currently although we will get a try she is in agreement with Aleve given this shot. Fortunately I do not see any signs of active infection systemically which is great news and even locally she seems to be doing quite well today. Patient's Right ABI is 1.08 with a TBI of 0.79 and the left ABI is 1.15 with a TBI of 0.84 on 08/03/20. She also has a history of chronic venous insufficiency and lymphedema but otherwise no major medical problems. 09/05/2020 upon evaluation today patient unfortunately appears to be having some issues today with her right leg this is completely different from what we have been taking care of on the left. She actually has some swelling and an area of erythema and pain which is in the medial calf region. There is no identifiable abscess definitely this could just be cellulitis but  with her history of DVT I think that we need to have this checked out. 09/12/2020 upon evaluation today patient appears to be doing better in regard to her wound. She has been tolerating the dressing changes without complication. Fortunately there does not appear to be any signs of active infection which is great news and overall I think that the patient is doing well in this regard. Her wounds are measuring smaller at both locations and the surface of the wound is looking better. With that being said she does have a wedding that she is supposed to be going to this weekend and states that she really needs to not have the wrap on for that. Nonetheless regular and discuss options to try to help maintain while she has this time with the wedding. 09/20/2020 upon evaluation today patient unfortunately is doing worse in regard to the pain associated with her wound at this time. Fortunately there does not appear to be any evidence of active infection systemically which is great news but in general I feel like locally she may definitely be infected. Subsequently I do believe that we need to address this as soon as possible. 09/26/2020 upon evaluation today patient appears to be doing well with regard to her wound. Fortunately there does not appear to be any signs of worsening in general. I think that overall the patient is making wonderful progress in my opinion. The right leg where she had an area of erythema medially also is better today which is great news. I think Levaquin has done a great job  in that regard. We did see evidence still some erythema on the right leg but again this is significantly improved compared to last week. Overall the Levaquin has done a great job in this regard. 10/03/2020 upon evaluation today patient appears to be doing well all things considered with regard to her wounds. There does not appear to be any signs of infection obvious at this point which is great news. Fortunately I think  that she is definitely making progress with regard to the appearance of the wound bed but nonetheless is still having quite a bit of an issue here with the pain aspect. Fortunately I think that we have some other options to consider him and use triamcinolone underneath the [facility] today. Also think looking into PuraPly/Apligraf as a treatment could be appropriate for this patient and my hope is that we will be able to get the wounds growing in a much faster pace as far as new tissue is concerned to get this closed sooner rather than later as that is the only way that we will get a get the pain completely resolved for her which is ultimately the goal we have as well obviously. 9/26; 2 wounds on the left anterior lower leg in the setting of what appears to be severe chronic venous insufficiency. We have been using Hydrofera Blue and 3 layer compression. Electronic Signature(s) Signed: 10/10/2020 4:23:07 PM By: Janet Ham MD Entered By: Janet [name] on 10/10/2020 09:04:48 Mckeehan, Janet MMarland [name] ([id number]) -------------------------------------------------------------------------------- Physical Exam Details Patient Name: Janet [name]. Date of Service: 10/10/2020 8:30 AM Medical Record Number: [id number] Patient Account Number: [id number] Date of Birth/Sex: May 24, 1950 (70 y.o. F) Treating RN: Janet [name] Primary Care Provider: Delight [name] Other Clinician: Referring Provider: Delight [name] Treating Provider/Extender: Janet [name] in Treatment: 6 Constitutional Patient is hypertensive.. Pulse regular and within target range for patient.[name] Respirations regular, non-labored and within target range.. Temperature is normal and within the target range for the patient.[name] appears in no distress. Cardiovascular Severe hemosiderin deposition. Edema control seems moderate.. Notes Wound exam; left anterior lower leg. 2 open areas in the same condition. We do not have any depth but almost  100% covered and nonviable tan- brown debris. There is no evidence of surrounding infection that I can see. Electronic Signature(s) Signed: 10/10/2020 4:23:07 PM By: Janet Ham MD Entered By: Janet [name] on 10/10/2020 09:06:44 Sindelar, Janet MMarland [name] ([id number]) -------------------------------------------------------------------------------- Physician Orders Details Patient Name: Janet [name]. Date of Service: 10/10/2020 8:30 AM Medical Record Number: [id number] Patient Account Number: [id number] Date of Birth/Sex: 1950/08/26 (70 y.o. F) Treating RN: Janet [name] Primary Care Provider: Delight [name] Other Clinician: Referring Provider: Delight [name] Treating Provider/Extender: Janet [name] in Treatment: 6 Verbal / Phone Orders: No Diagnosis Coding ICD-10 Coding Code Description I87.2 Venous insufficiency (chronic) (peripheral) I89.0 Lymphedema, not elsewhere classified 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 of other part of left lower leg with fat layer exposed L03.115 Cellulitis of right lower limb R22.41 Localized swelling, mass and lump, right lower limb Follow-up Appointments o Return Appointment in 1 week. o Nurse Visit as needed Bathing/ Shower/ Hygiene o May shower with wound dressing protected with water repellent cover or cast protector. o No tub bath. Edema Control - Lymphedema / Segmental Compressive Device / Other o Optional: One layer of unna paste to top of compression wrap (to act as an anchor). - left leg o Patient to wear own compression stockings. Remove compression stockings every night before going to bed and put on  every morning when getting up. - right leg-see [employer] contact and measurements o Patient to wear own Velcro compression garment. Remove compression stockings every night before going to bed and put on every morning when getting up. - will be mailed to you, "JUXTELITE" o Elevate legs to the level of the heart and pump  ankles as often as possible o Elevate leg(s) parallel to the floor when sitting. o DO YOUR BEST to sleep in the bed at night. DO NOT sleep in your recliner. Long hours of sitting in a recliner leads to swelling of the legs and/or potential wounds on your backside. Medications-Please add to medication list. o Take one 500mg  Tylenol (Acetaminophen) and one 200mg  Motrin (Ibuprofen) every 6 hours for pain. Do not take ibuprofen if you are on blood thinners or have stomach ulcers. - as needed for pain if not contraindicated by other physician Wound Treatment Wound #1 - Lower Leg Wound Laterality: Left, Anterior, Distal Peri-Wound Care: Triamcinolone Acetonide Cream, 0.1%, 15 (g) tube Topical: Triamcinolone Acetonide Cream, 0.1%, 15 (g) tube Discharge Instructions: Apply as directed by provider. Primary Dressing: Hydrofera Blue Ready Transfer Foam, 2.5x2.5 (in/in) Discharge Instructions: Apply Hydrofera Blue Ready to wound bed as directed Compression Wrap: Profore Lite LF 3 Multilayer Compression Bandaging System Discharge Instructions: Apply 3 multi-layer wrap as prescribed. Wound #2 - Lower Leg Wound Laterality: Left, Anterior, Proximal Peri-Wound Care: Triamcinolone Acetonide Cream, 0.1%, 15 (g) tube Topical: Triamcinolone Acetonide Cream, 0.1%, 15 (g) tube Discharge Instructions: Apply as directed by provider. Primary Dressing: Hydrofera Blue Ready Transfer Foam, 2.5x2.5 (in/in) Discharge Instructions: Apply Hydrofera Blue Ready to wound bed as directed Compression Wrap: Profore Lite LF 3 Multilayer Compression [place], Janet [name]. ([id number]) Discharge Instructions: Apply 3 multi-layer wrap as prescribed. Electronic Signature(s) Signed: 10/10/2020 4:23:07 PM By: Janet Ham MD Signed: 10/10/2020 4:33:20 PM By: Janet [name] Entered By: Janet [name] on 10/10/2020 09:08:14 Bernabei, Janet [name].  ([id number]) -------------------------------------------------------------------------------- Problem List Details Patient Name: Janet [name]. Date of Service: 10/10/2020 8:30 AM Medical Record Number: [id number] Patient Account Number: [id number] Date of Birth/Sex: [birth year] (70 y.o. F) Treating RN: Janet [name] Primary Care Provider: Delight [name] Other Clinician: Referring Provider: Delight [name] Treating Provider/Extender: Janet [name] in Treatment: 6 Active Problems ICD-10 Encounter Code Description Active Date MDM Diagnosis I87.2 Venous insufficiency (chronic) (peripheral) 08/29/2020 No Yes I89.0 Lymphedema, not elsewhere classified 08/29/2020 No Yes 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 of other part of left lower leg with fat layer 08/29/2020 No Yes exposed L03.115 Cellulitis of right lower limb 09/05/2020 No Yes R22.41 Localized swelling, mass and lump, right lower limb 09/05/2020 No Yes Inactive Problems Resolved Problems Electronic Signature(s) Signed: 10/10/2020 4:23:07 PM By: Janet Ham MD Entered By: Janet [name] on 10/10/2020 09:02:30 Roeper, Ettie [name]. ([id number]) -------------------------------------------------------------------------------- Progress Note Details Patient Name: Janet [name]. Date of Service: 10/10/2020 8:30 AM Medical Record Number: [id number] Patient Account Number: [id number] Date of Birth/Sex: 1950/02/01 (70 y.o. F) Treating RN: Janet [name] Primary Care Provider: Delight [name] Other Clinician: Referring Provider: Delight [name] Treating Provider/Extender: Janet [name] in Treatment: 6 Subjective History of Present Illness (HPI) 08/29/2020 upon evaluation today patient appears to be doing somewhat poorly in regard to what appeared to be venous leg ulcers on the left leg that she has been dealing with since around the beginning of May. Fortunately she does not have any signs of obvious infection unfortunately she does have quite a  bit of necrotic tissue that does not really need to  be debrided. She has been seeing vascular and they did wrap her for a time with what was likely an Haematologist. With that being said the [facility] boot has given her some trouble as far as feeling claustrophobic was concerned she is not sure if she is good to be able to tolerate a compression wrap currently although we will get a try she is in agreement with Aleve given this shot. Fortunately I do not see any signs of active infection systemically which is great news and even locally she seems to be doing quite well today. Patient's Right ABI is 1.08 with a TBI of 0.79 and the left ABI is 1.15 with a TBI of 0.84 on 08/03/20. She also has a history of chronic venous insufficiency and lymphedema but otherwise no major medical problems. 09/05/2020 upon evaluation today patient unfortunately appears to be having some issues today with her right leg this is completely different from what we have been taking care of on the left. She actually has some swelling and an area of erythema and pain which is in the medial calf region. There is no identifiable abscess definitely this could just be cellulitis but with her history of DVT I think that we need to have this checked out. 09/12/2020 upon evaluation today patient appears to be doing better in regard to her wound. She has been tolerating the dressing changes without complication. Fortunately there does not appear to be any signs of active infection which is great news and overall I think that the patient is doing well in this regard. Her wounds are measuring smaller at both locations and the surface of the wound is looking better. With that being said she does have a wedding that she is supposed to be going to this weekend and states that she really needs to not have the wrap on for that. Nonetheless regular and discuss options to try to help maintain while she has this time with the wedding. 09/20/2020 upon evaluation  today patient unfortunately is doing worse in regard to the pain associated with her wound at this time. Fortunately there does not appear to be any evidence of active infection systemically which is great news but in general I feel like locally she may definitely be infected. Subsequently I do believe that we need to address this as soon as possible. 09/26/2020 upon evaluation today patient appears to be doing well with regard to her wound. Fortunately there does not appear to be any signs of worsening in general. I think that overall the patient is making wonderful progress in my opinion. The right leg where she had an area of erythema medially also is better today which is great news. I think Levaquin has done a great job in that regard. We did see evidence still some erythema on the right leg but again this is significantly improved compared to last week. Overall the Levaquin has done a great job in this regard. 10/03/2020 upon evaluation today patient appears to be doing well all things considered with regard to her wounds. There does not appear to be any signs of infection obvious at this point which is great news. Fortunately I think that she is definitely making progress with regard to the appearance of the wound bed but nonetheless is still having quite a bit of an issue here with the pain aspect. Fortunately I think that we have some other options to consider him and use triamcinolone underneath the [facility] today. Also think looking into  PuraPly/Apligraf as a treatment could be appropriate for this patient and my hope is that we will be able to get the wounds growing in a much faster pace as far as new tissue is concerned to get this closed sooner rather than later as that is the only way that we will get a get the pain completely resolved for her which is ultimately the goal we have as well obviously. 9/26; 2 wounds on the left anterior lower leg in the setting of what appears to be  severe chronic venous insufficiency. We have been using Hydrofera Blue and 3 layer compression. Objective Constitutional Patient is hypertensive.. Pulse regular and within target range for patient.[name] Respirations regular, non-labored and within target range.. Temperature is normal and within the target range for the patient.[name] appears in no distress. Vitals Time Taken: 8:42 AM, Height: 66 in, Weight: 192 lbs, BMI: 31, Temperature: 97.8 F, Pulse: 74 bpm, Respiratory Rate: 16 breaths/min, Blood Pressure: 153/74 mmHg. Cardiovascular Severe hemosiderin deposition. Edema control seems moderate.. General Notes: Wound exam; left anterior lower leg. 2 open areas in the same condition. We do not have any depth but almost 100% covered Kramp, Mira [name]. ([id number]) and nonviable tan-brown debris. There is no evidence of surrounding infection that I can see. Integumentary (Hair, Skin) Wound #1 status is Open. Original cause of wound was Blister. The date acquired was: 05/15/2020. The wound has been in treatment 6 weeks. The wound is located on the [facility] Lower Leg. The wound measures 4.5cm length x 2.7cm width x 0.2cm depth; 9.543cm^2 area and 1.909cm^3 volume. There is Fat Layer (Subcutaneous Tissue) exposed. There is no tunneling or undermining noted. There is a medium amount of sanguinous drainage noted. There is medium (34-66%) red, hyper - granulation within the wound bed. There is a medium (34-66%) amount of necrotic tissue within the wound bed including Adherent Slough. Wound #2 status is Open. Original cause of wound was Blister. The date acquired was: 05/15/2020. The wound has been in treatment 6 weeks. The wound is located on the Left,Proximal,Anterior Lower Leg. The wound measures 2.4cm length x 1.7cm width x 0.1cm depth; 3.204cm^2 area and 0.32cm^3 volume. There is Fat Layer (Subcutaneous Tissue) exposed. There is a medium amount of serosanguineous drainage noted. The wound margin is  flat and intact. There is medium (34-66%) red, hyper - granulation within the wound bed. There is a medium (34-66%) amount of necrotic tissue within the wound bed including Adherent Slough. Assessment Active Problems ICD-10 Venous insufficiency (chronic) (peripheral) Lymphedema, not elsewhere classified Non-pressure chronic ulcer of other part of left lower leg with fat layer exposed Cellulitis of right lower limb Localized swelling, mass and lump, right lower limb Plan 1. Still completely nonviable surfaces on the wound. Looking at the pictures from last week the surfaces look somewhat better there are 4 I continued with the Hydrofera Blue 2. I did discuss mechanical debridement with her again she was not willing to allow this even though I explained that her wounds are not really able to epithelialize because of the surface 3. Attempted to vigorously debride these with saline and gauze really nothing of the surface dislodges Electronic Signature(s) Signed: 10/10/2020 4:23:07 PM By: Janet Ham MD Entered By: Janet [name] on 10/10/2020 09:08:18 Haun, Virgil. ([id number]) -------------------------------------------------------------------------------- SuperBill Details Patient Name: Janet [name]. Date of Service: 10/10/2020 Medical Record Number: [id number] Patient Account Number: [id number] Date of Birth/Sex: 08/14/1950 (70 y.o. F) Treating RN: Janet [name] Primary Care Provider: Delight [name] Other Clinician: Referring  Provider: Delight [name] Treating Provider/Extender: Janet [name] in Treatment: 6 Diagnosis Coding ICD-10 Codes Code Description I87.2 Venous insufficiency (chronic) (peripheral) I89.0 Lymphedema, not elsewhere classified 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 of other part of left lower leg with fat layer exposed L03.115 Cellulitis of right lower limb R22.41 Localized swelling, mass and lump, right lower limb Facility Procedures CPT4 Code:  [id number] Description: (Facility Use Only) [phone] - APPLY MULTLAY COMPRS LWR LT LEG Modifier: Quantity: 1 Physician Procedures CPT4 Code: [id number] Description: 90228 - WC PHYS LEVEL 3 - EST PT Modifier: Quantity: 1 CPT4 Code: Description: ICD-10 Diagnosis Description 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 of other part of left lower leg with fat lay I87.2 Venous insufficiency (chronic) (peripheral) I89.0 Lymphedema, not elsewhere classified Modifier: er exposed Quantity: Electronic Signature(s) Signed: 10/10/2020 4:23:07 PM By: Janet Ham MD Signed: 10/10/2020 4:33:20 PM By: Janet [name] Entered ByDonnamarie [name] on 10/10/2020 09:10:07

## 2020-10-10 NOTE — Progress Notes (Signed)
MIKENA, MASONER ([id number]) Visit Report for 10/10/2020 Arrival Information Details Patient Name: Janet [name], Janet [name]. Date of Service: 10/10/2020 8:30 AM Medical Record Number: [id number] Patient Account Number: [id number] Date of Birth/Sex: 1950-05-12 (70 y.o. F) Treating RN: Donnamarie Poag Primary Care Kydan Shanholtzer: Delight Stare Other Clinician: Referring Jeree Delcid: Delight Stare Treating Bonham Zingale/Extender: Tito Dine in Treatment: 6 Visit Information History Since Last Visit Added or deleted any medications: No Patient Arrived: Ambulatory Had a fall or experienced change in No Arrival Time: 08:42 activities of daily living that may affect Accompanied By: self risk of falls: Transfer Assistance: None Hospitalized since last visit: No Patient Identification Verified: Yes Has Dressing in Place as Prescribed: Yes Secondary Verification Process Completed: Yes Has Compression in Place as Prescribed: Yes Patient Has Alerts: Yes Pain Present Now: Yes Patient Alerts: Patient on Blood Thinner Aspirin 69m AVVS ABI/TBI 7/22 ABI L- 1.15/ R- 1.08 Electronic Signature(s) Signed: 10/10/2020 4:33:20 PM By: BDonnamarie PoagEntered By: BDonnamarie Poagon 10/10/2020 08:43:22 [place] ([id number] -------------------------------------------------------------------------------- Clinic Level of Care Assessment Details Patient Name: Janet [name] Date of Service: 10/10/2020 8:30 AM Medical Record Number: [id number] Account Number: [id number] of Birth/Sex: [birth year](70y.o. F) Treating RN: BDonnamarie PoagPrimary Care Zaynah Chawla: MDelight StareOther Clinician: Referring Zi Newbury: MDelight StareTreating Kenneth Cuaresma/Extender: RTito Dinein Treatment: 6 Clinic Level of Care Assessment Items TOOL 1 Quantity Score _0  - Use when EandM and Procedure is performed on INITIAL visit 0 ASSESSMENTS - Nursing Assessment / Reassessment _1  - General Physical Exam (combine w/ comprehensive  assessment (listed just below) when performed on new 0 pt. evals) _2  - 0 Comprehensive Assessment (HX, ROS, Risk Assessments, Wounds Hx, etc.) ASSESSMENTS - Wound and Skin Assessment / Reassessment _3  - Dermatologic / Skin Assessment (not related to wound area) 0 ASSESSMENTS - Ostomy and/or Continence Assessment and Care _4  - Incontinence Assessment and Management 0 _5  - 0 Ostomy Care Assessment and Management (repouching, etc.) PROCESS - Coordination of Care _6  - Simple Patient / Family Education for ongoing care 0 _7  - 0 Complex (extensive) Patient / Family Education for ongoing care _8  - 0 Staff obtains CProgrammer, systems Records, Test Results / Process Orders _9  - 0 Staff telephones HHA, Nursing Homes / Clarify orders / etc _10  - 0 Routine Transfer to another Facility (non-emergent condition) _11  - 0 Routine Hospital Admission (non-emergent condition) _12  - 0 New Admissions / IBiomedical engineer/ Ordering NPWT, Apligraf, etc. _13  - 0 Emergency Hospital Admission (emergent condition) PROCESS - Special Needs _14  - Pediatric / Minor Patient Management 0 _15  - 0 Isolation Patient Management _16  - 0 Hearing / Language / Visual special needs _17  - 0 Assessment of Community assistance (transportation, D/C planning, etc.) _18  - 0 Additional assistance / Altered mentation _19  - 0 Support Surface(s) Assessment (bed, cushion, seat, etc.) INTERVENTIONS - Miscellaneous _20  - External ear exam 0 _21  - 0 Patient Transfer (multiple staff / HCivil Service fast streamer/ Similar devices) _22  - 0 Simple Staple / Suture removal (25 or less) _23  - 0 Complex Staple / Suture removal (26 or more) _24  - 0 Hypo/Hyperglycemic Management (do not check if billed separately) _25  - 0 Ankle / Brachial Index (ABI) - do not check if billed separately Has the patient been seen at the hospital within the last three years: Yes Total Score: 0 Level Of Care: ____ Janet [name]([id number] Electronic Signature(s) Signed: 10/10/2020  4:33:20 PM By: BDonnamarie PoagEntered By:Donnamarie Poagon 10/10/2020 09:08:52 Manseau,  Concha M. ([id number]) -------------------------------------------------------------------------------- Compression Therapy Details Patient Name: Janet [name]. Date of Service: 10/10/2020 8:30 AM Medical Record Number: [id number] Patient Account Number: [id number] Date of Birth/Sex: 06-[name]-1951 (70 y.o. F) Treating RN: Donnamarie Poag Primary Care Elchanan Bob: Delight Stare Other Clinician: Referring Kimani Hovis: Delight Stare Treating Crystelle Ferrufino/Extender: Tito Dine in Treatment: 6 Compression Therapy Performed for Wound Assessment: Wound #2 Left,Proximal,Anterior Lower Leg Performed By: Clinician Donnamarie Poag, RN Compression Type: Three Layer Post Procedure Diagnosis Same as Pre-procedure Electronic Signature(s) Signed: 10/10/2020 4:33:20 PM By: Donnamarie Poag Entered By: Donnamarie Poag on 10/10/2020 09:09:26 Stockman, Adreonna M. ([id number]) -------------------------------------------------------------------------------- Compression Therapy Details Patient Name: Ronnette [name]. Date of Service: 10/10/2020 8:30 AM Medical Record Number: [id number] Patient Account Number: [id number] Date of Birth/Sex: March 13, 1950 (70 y.o. F) Treating RN: Donnamarie Poag Primary Care Nandana Krolikowski: Delight Stare Other Clinician: Referring Amish Mintzer: Delight Stare Treating Demeshia Sherburne/Extender: Tito Dine in Treatment: 6 Compression Therapy Performed for Wound Assessment: Wound #1 Left,Distal,Anterior Lower Leg Performed By: Clinician Donnamarie Poag, RN Compression Type: Three Layer Post Procedure Diagnosis Same as Pre-procedure Electronic Signature(s) Signed: 10/10/2020 4:33:20 PM By: Donnamarie Poag Entered By: Donnamarie Poag on 10/10/2020 09:09:26 [place]port, Duquesne M. ([id number]) -------------------------------------------------------------------------------- Encounter Discharge Information Details Patient Name: Ronnette [name]. Date of Service:  10/10/2020 8:30 AM Medical Record Number: [id number] Patient Account Number: [id number] Date of Birth/Sex: 15-Jun-1950 (70 y.o. F) Treating RN: Donnamarie Poag Primary Care Shaden Lacher: Delight Stare Other Clinician: Referring Sueko Dimichele: Delight Stare Treating Ayaz Sondgeroth/Extender: Tito Dine in Treatment: 6 Encounter Discharge Information Items Discharge Condition: Stable Ambulatory Status: Ambulatory Discharge Destination: Home Transportation: Private Auto Accompanied By: self Schedule Follow-up Appointment: Yes Clinical Summary of Care: Electronic Signature(s) Signed: 10/10/2020 4:33:20 PM By: Donnamarie Poag Entered By: Donnamarie Poag on 10/10/2020 09:11:06 Ozaki, Amoy M. ([id number]) -------------------------------------------------------------------------------- Lower Extremity Assessment Details Patient Name: Ronnette [name]. Date of Service: 10/10/2020 8:30 AM Medical Record Number: [id number] Patient Account Number: [id number] Date of Birth/Sex: [birth year] (70 y.o. F) Treating RN: Donnamarie Poag Primary Care Roczen Waymire: Delight Stare Other Clinician: Referring Jewels Langone: Delight Stare Treating Onesimo Lingard/Extender: Tito Dine in Treatment: 6 Edema Assessment Assessed: [Left: Yes] [Right: No] Edema: [Left: N] [Right: o] Calf Left: Right: Point of Measurement: 29 cm From Medial Instep 37 cm Ankle Left: Right: Point of Measurement: 10 cm From Medial Instep 24.5 cm Knee To Floor Left: Right: From Medial Instep 41 cm Vascular Assessment Pulses: Dorsalis Pedis Palpable: [Left:Yes] Electronic Signature(s) Signed: 10/10/2020 4:33:20 PM By: Donnamarie Poag Entered By: Donnamarie Poag on 10/10/2020 08:50:55 Grimes, Welma M. ([id number]) -------------------------------------------------------------------------------- Multi Wound Chart Details Patient Name: Ronnette [name]. Date of Service: 10/10/2020 8:30 AM Medical Record Number: [id number] Patient Account Number: [id number] Date of Birth/Sex:  1950-06-26 (70 y.o. F) Treating RN: Donnamarie Poag Primary Care Tonita Bills: Delight Stare Other Clinician: Referring Samamtha Tiegs: Delight Stare Treating Khyron Garno/Extender: Tito Dine in Treatment: 6 Vital Signs Height(in): 81 Pulse(bpm): 30 Weight(lbs): 192 Blood Pressure(mmHg): 153/74 Body Mass Index(BMI): 31 Temperature(F): 97.8 Respiratory Rate(breaths/min): 16 Photos: [N/A:N/A] Wound Location: Left, Distal, Anterior Lower Leg Left, Proximal, Anterior Lower Leg N/A Wounding Event: Blister Blister N/A Primary Etiology: Venous Leg Ulcer Venous Leg Ulcer N/A Secondary Etiology: Lymphedema Lymphedema N/A Comorbid History: Asthma, Hypertension Asthma, Hypertension N/A Date Acquired: 05/15/2020 05/15/2020 N/A Weeks of Treatment: 6 6 N/A Wound Status: Open Open N/A Measurements L x W x D (cm) 4.5x2.7x0.2 2.4x1.7x0.1 N/A Area (cm) : 9.543 3.204 N/A Volume (cm) : 1.909 0.32 N/A % Reduction in Area: -59.90% -172.00% N/A %  Reduction in Volume: -219.80% -171.20% N/A Classification: Full Thickness Without Exposed Full Thickness Without Exposed N/A Support Structures Support Structures Exudate Amount: Medium Medium N/A Exudate Type: Sanguinous Serosanguineous N/A Exudate Color: red red, brown N/A Wound Margin: N/A Flat and Intact N/A Granulation Amount: Medium (34-66%) Medium (34-66%) N/A Granulation Quality: Red, Hyper-granulation Red, Hyper-granulation N/A Necrotic Amount: Medium (34-66%) Medium (34-66%) N/A Exposed Structures: Fat Layer (Subcutaneous Tissue): Fat Layer (Subcutaneous Tissue): N/A Yes Yes Fascia: No Fascia: No Tendon: No Tendon: No Muscle: No Muscle: No Joint: No Joint: No Bone: No Bone: No Epithelialization: None None N/A Treatment Notes Electronic Signature(s) Signed: 10/10/2020 4:23:07 PM By: Linton Ham MD Entered By: Linton Ham on 10/10/2020 Hamilton, Margarette M.  ([id number]) -------------------------------------------------------------------------------- [place] Details Patient Name: Ronnette [name]. Date of Service: 10/10/2020 8:30 AM Medical Record Number: [id number] Patient Account Number: [id number] Date of Birth/Sex: 1950-12-16 (70 y.o. F) Treating RN: Donnamarie Poag Primary Care Makye Radle: Delight Stare Other Clinician: Referring Kathleen Likins: Delight Stare Treating Ileanna Gemmill/Extender: Tito Dine in Treatment: 6 Active Inactive Wound/Skin Impairment Nursing Diagnoses: Impaired tissue integrity Knowledge deficit related to smoking impact on wound healing Knowledge deficit related to ulceration/compromised skin integrity Goals: Patient/caregiver will verbalize understanding of skin care regimen Date Initiated: 08/29/2020 Date Inactivated: 09/20/2020 Target Resolution Date: 09/14/2020 Goal Status: Met Ulcer/skin breakdown will have a volume reduction of 30% by week 4 Date Initiated: 08/29/2020 Target Resolution Date: 09/29/2020 Goal Status: Active Ulcer/skin breakdown will have a volume reduction of 50% by week 8 Date Initiated: 08/29/2020 Target Resolution Date: 10/29/2020 Goal Status: Active Ulcer/skin breakdown will have a volume reduction of 80% by week 12 Date Initiated: 08/29/2020 Target Resolution Date: 11/29/2020 Goal Status: Active Ulcer/skin breakdown will heal within 14 weeks Date Initiated: 08/29/2020 Target Resolution Date: 12/14/2020 Goal Status: Active Interventions: Assess patient/caregiver ability to obtain necessary supplies Assess patient/caregiver ability to perform ulcer/skin care regimen upon admission and as needed Assess ulceration(s) every visit Provide education on ulcer and skin care Notes: Electronic Signature(s) Signed: 10/10/2020 4:33:20 PM By: Donnamarie Poag Entered By: Donnamarie Poag on 10/10/2020 08:52:16 Crace, Janille M.  ([id number]) -------------------------------------------------------------------------------- Pain Assessment Details Patient Name: Ronnette [name]. Date of Service: 10/10/2020 8:30 AM Medical Record Number: [id number] Patient Account Number: [id number] Date of Birth/Sex: 07-11-1950 (70 y.o. F) Treating RN: Donnamarie Poag Primary Care Cleopha Indelicato: Delight Stare Other Clinician: Referring Sheretta Grumbine: Delight Stare Treating Juliah Scadden/Extender: Tito Dine in Treatment: 6 Active Problems Location of Pain Severity and Description of Pain Patient Has Paino Yes Site Locations Rate the pain. Current Pain Level: 7 Pain Management and Medication Current Pain Management: Electronic Signature(s) Signed: 10/10/2020 4:33:20 PM By: Donnamarie Poag Entered By: Donnamarie Poag on 10/10/2020 08:46:35 Mahnken, Brelynn M. ([id number]) -------------------------------------------------------------------------------- Patient/Caregiver Education Details Patient Name: Ronnette [name]. Date of Service: 10/10/2020 8:30 AM Medical Record Number: [id number] Patient Account Number: [id number] Date of Birth/Gender: [birth year] (70 y.o. F) Treating RN: Donnamarie Poag Primary Care Physician: Delight Stare Other Clinician: Referring Physician: Delight Stare Treating Physician/Extender: Tito Dine in Treatment: 6 Education Assessment Education Provided To: Patient Education Topics Provided Wound Debridement: Wound/Skin Impairment: Electronic Signature(s) Signed: 10/10/2020 4:33:20 PM By: Donnamarie Poag Entered By: Donnamarie Poag on 10/10/2020 09:10:26 Stanwood, Petrea M. ([id number]) -------------------------------------------------------------------------------- Wound Assessment Details Patient Name: Ronnette [name]. Date of Service: 10/10/2020 8:30 AM Medical Record Number: [id number] Patient Account Number: [id number] Date of Birth/Sex: January 25, 1950 (70 y.o. F) Treating RN: Donnamarie Poag Primary Care Axell Trigueros: Delight Stare Other  Clinician: Referring Deaysia Grigoryan: Lennox Grumbles,  Linda Treating Makyiah Lie/Extender: Ricard Dillon Weeks in Treatment: 6 Wound Status Wound Number: 1 Primary Etiology: Venous Leg Ulcer Wound Location: Left, Distal, Anterior Lower Leg Secondary Etiology: Lymphedema Wounding Event: Blister Wound Status: Open Date Acquired: 05/15/2020 Comorbid History: Asthma, Hypertension Weeks Of Treatment: 6 Clustered Wound: No Photos Wound Measurements Length: (cm) 4.5 Width: (cm) 2.7 Depth: (cm) 0.2 Area: (cm) 9.543 Volume: (cm) 1.909 % Reduction in Area: -59.9% % Reduction in Volume: -219.8% Epithelialization: None Tunneling: No Undermining: No Wound Description Classification: Full Thickness Without Exposed Support Structu Exudate Amount: Medium Exudate Type: Sanguinous Exudate Color: red res Foul Odor After Cleansing: No Slough/Fibrino Yes Wound Bed Granulation Amount: Medium (34-66%) Exposed Structure Granulation Quality: Red, Hyper-granulation Fascia Exposed: No Necrotic Amount: Medium (34-66%) Fat Layer (Subcutaneous Tissue) Exposed: Yes Necrotic Quality: Adherent Slough Tendon Exposed: No Muscle Exposed: No Joint Exposed: No Bone Exposed: No Treatment Notes Wound #1 (Lower Leg) Wound Laterality: Left, Anterior, Distal Cleanser Peri-Wound Care Triamcinolone Acetonide Cream, 0.1%, 15 (g) tube Topical Triamcinolone Acetonide Cream, 0.1%, 15 (g) tube Dobbin, Gerri M. ([id number]) Discharge Instruction: Apply as directed by Hosie Sharman. Primary Dressing Hydrofera Blue Ready Transfer Foam, 2.5x2.5 (in/in) Discharge Instruction: Apply Hydrofera Blue Ready to wound bed as directed Secondary Dressing Secured With Compression Wrap Profore Lite LF 3 Multilayer Compression Bandaging System Discharge Instruction: Apply 3 multi-layer wrap as prescribed. Compression Stockings Add-Ons Electronic Signature(s) Signed: 10/10/2020 4:33:20 PM By: Donnamarie Poag Entered By: Donnamarie Poag on 10/10/2020  08:48:03 Cloke, Robie M. ([id number]) -------------------------------------------------------------------------------- Wound Assessment Details Patient Name: Ronnette [name]. Date of Service: 10/10/2020 8:30 AM Medical Record Number: [id number] Patient Account Number: [id number] Date of Birth/Sex: 1950-12-03 (70 y.o. F) Treating RN: Donnamarie Poag Primary Care Selig Wampole: Delight Stare Other Clinician: Referring Arleth Mccullar: Delight Stare Treating Tramayne Sebesta/Extender: Tito Dine in Treatment: 6 Wound Status Wound Number: 2 Primary Etiology: Venous Leg Ulcer Wound Location: Left, Proximal, Anterior Lower Leg Secondary Etiology: Lymphedema Wounding Event: Blister Wound Status: Open Date Acquired: 05/15/2020 Comorbid History: Asthma, Hypertension Weeks Of Treatment: 6 Clustered Wound: No Photos Wound Measurements Length: (cm) 2.4 Width: (cm) 1.7 Depth: (cm) 0.1 Area: (cm) 3.204 Volume: (cm) 0.32 % Reduction in Area: -172% % Reduction in Volume: -171.2% Epithelialization: None Wound Description Classification: Full Thickness Without Exposed Support Structu Wound Margin: Flat and Intact Exudate Amount: Medium Exudate Type: Serosanguineous Exudate Color: red, brown res Foul Odor After Cleansing: No Slough/Fibrino Yes Wound Bed Granulation Amount: Medium (34-66%) Exposed Structure Granulation Quality: Red, Hyper-granulation Fascia Exposed: No Necrotic Amount: Medium (34-66%) Fat Layer (Subcutaneous Tissue) Exposed: Yes Necrotic Quality: Adherent Slough Tendon Exposed: No Muscle Exposed: No Joint Exposed: No Bone Exposed: No Treatment Notes Wound #2 (Lower Leg) Wound Laterality: Left, Anterior, Proximal Cleanser Peri-Wound Care Triamcinolone Acetonide Cream, 0.1%, 15 (g) tube Topical Zingg, Rylann M. ([id number]) Triamcinolone Acetonide Cream, 0.1%, 15 (g) tube Discharge Instruction: Apply as directed by Vivaan Helseth. Primary Dressing Hydrofera Blue Ready Transfer Foam,  2.5x2.5 (in/in) Discharge Instruction: Apply Hydrofera Blue Ready to wound bed as directed Secondary Dressing Secured With Compression Wrap Profore Lite LF 3 Multilayer Compression Bandaging System Discharge Instruction: Apply 3 multi-layer wrap as prescribed. Compression Stockings Add-Ons Electronic Signature(s) Signed: 10/10/2020 4:33:20 PM By: Donnamarie Poag Entered By: Donnamarie Poag on 10/10/2020 08:49:57 Bejarano, Marlin M. ([id number]) -------------------------------------------------------------------------------- Vitals Details Patient Name: Ronnette [name]. Date of Service: 10/10/2020 8:30 AM Medical Record Number: [id number] Patient Account Number: [id number] Date of Birth/Sex: 12-27-1950 (70 y.o. F) Treating RN: Donnamarie Poag Primary Care Dorothy Polhemus: Delight Stare Other Clinician:  Referring Teresa Nicodemus: Delight Stare Treating Myli Pae/Extender: Tito Dine in Treatment: 6 Vital Signs Time Taken: 08:42 Temperature (F): 97.8 Height (in): 66 Pulse (bpm): 74 Weight (lbs): 192 Respiratory Rate (breaths/min): 16 Body Mass Index (BMI): 31 Blood Pressure (mmHg): 153/74 Reference Range: 80 - 120 mg / dl Electronic Signature(s) Signed: 10/10/2020 4:33:20 PM By: Donnamarie Poag Entered ByDonnamarie Poag on 10/10/2020 08:44:03

## 2020-10-17 ENCOUNTER — Encounter: Payer: Medicare Other | Attending: Physician Assistant | Admitting: Physician Assistant

## 2020-10-17 ENCOUNTER — Other Ambulatory Visit: Payer: Self-pay

## 2020-10-17 DIAGNOSIS — I872 Venous insufficiency (chronic) (peripheral): Secondary | ICD-10-CM | POA: Diagnosis not present

## 2020-10-17 DIAGNOSIS — I890 Lymphedema, not elsewhere classified: Secondary | ICD-10-CM | POA: Diagnosis not present

## 2020-10-17 DIAGNOSIS — L03115 Cellulitis of right lower limb: Secondary | ICD-10-CM | POA: Insufficient documentation

## 2020-10-17 DIAGNOSIS — L97822 Non-pressure chronic ulcer of other part of left lower leg with fat layer exposed: Secondary | ICD-10-CM | POA: Insufficient documentation

## 2020-10-17 DIAGNOSIS — R2241 Localized swelling, mass and lump, right lower limb: Secondary | ICD-10-CM | POA: Insufficient documentation

## 2020-10-17 NOTE — Progress Notes (Addendum)
Janet, [name] ([id number]) Visit Report for 10/17/2020 Chief Complaint Document Details Patient Name: Janet [name], Janet [name]. Date of Service: 10/17/2020 8:30 AM Medical Record Number: [id number] Patient Account Number: [id number] Date of Birth/Sex: Sep 19, 1950 (70 y.o. F) Treating RN: Donnamarie Poag Primary Care Provider: Delight Stare Other Clinician: Referring Provider: Delight Stare Treating Provider/Extender: Skipper Cliche in Treatment: 7 Information Obtained from: Patient Chief Complaint Left LE Ulcers Electronic Signature(s) Signed: 10/17/2020 9:01:48 AM By: Worthy Keeler PA-C Entered By: Worthy Keeler on 10/17/2020 09:01:47 Tirey, Maree M. ([id number]) -------------------------------------------------------------------------------- HPI Details Patient Name: Janet [name]. Date of Service: 10/17/2020 8:30 AM Medical Record Number: [id number] Patient Account Number: [id number] Date of Birth/Sex: [birth year] (70 y.o. F) Treating RN: Donnamarie Poag Primary Care Provider: Delight Stare Other Clinician: Referring Provider: Delight Stare Treating Provider/Extender: Skipper Cliche in Treatment: 7 History of Present Illness HPI Description: 08/29/2020 upon evaluation today patient appears to be doing somewhat poorly in regard to what appeared to be venous leg ulcers on the left leg that she has been dealing with since around the beginning of May. Fortunately she does not have any signs of obvious infection unfortunately she does have quite a bit of necrotic tissue that does not really need to be debrided. She has been seeing vascular and they did wrap her for a time with what was likely an Haematologist. With that being said the [facility] boot has given her some trouble as far as feeling claustrophobic was concerned she is not sure if she is good to be able to tolerate a compression wrap currently although we will get a try she is in agreement with Aleve given this shot. Fortunately I do not see any signs of  active infection systemically which is great news and even locally she seems to be doing quite well today. Patient's Right ABI is 1.08 with a TBI of 0.79 and the left ABI is 1.15 with a TBI of 0.84 on 08/03/20. She also has a history of chronic venous insufficiency and lymphedema but otherwise no major medical problems. 09/05/2020 upon evaluation today patient unfortunately appears to be having some issues today with her right leg this is completely different from what we have been taking care of on the left. She actually has some swelling and an area of erythema and pain which is in the medial calf region. There is no identifiable abscess definitely this could just be cellulitis but with her history of DVT I think that we need to have this checked out. 09/12/2020 upon evaluation today patient appears to be doing better in regard to her wound. She has been tolerating the dressing changes without complication. Fortunately there does not appear to be any signs of active infection which is great news and overall I think that the patient is doing well in this regard. Her wounds are measuring smaller at both locations and the surface of the wound is looking better. With that being said she does have a wedding that she is supposed to be going to this weekend and states that she really needs to not have the wrap on for that. Nonetheless regular and discuss options to try to help maintain while she has this time with the wedding. 09/20/2020 upon evaluation today patient unfortunately is doing worse in regard to the pain associated with her wound at this time. Fortunately there does not appear to be any evidence of active infection systemically which is great news but in general I feel like locally she may  definitely be infected. Subsequently I do believe that we need to address this as soon as possible. 09/26/2020 upon evaluation today patient appears to be doing well with regard to her wound. Fortunately there  does not appear to be any signs of worsening in general. I think that overall the patient is making wonderful progress in my opinion. The right leg where she had an area of erythema medially also is better today which is great news. I think Levaquin has done a great job in that regard. We did see evidence still some erythema on the right leg but again this is significantly improved compared to last week. Overall the Levaquin has done a great job in this regard. 10/03/2020 upon evaluation today patient appears to be doing well all things considered with regard to her wounds. There does not appear to be any signs of infection obvious at this point which is great news. Fortunately I think that she is definitely making progress with regard to the appearance of the wound bed but nonetheless is still having quite a bit of an issue here with the pain aspect. Fortunately I think that we have some other options to consider him and use triamcinolone underneath the [facility] today. Also think looking into PuraPly/Apligraf as a treatment could be appropriate for this patient and my hope is that we will be able to get the wounds growing in a much faster pace as far as new tissue is concerned to get this closed sooner rather than later as that is the only way that we will get a get the pain completely resolved for her which is ultimately the goal we have as well obviously. 9/26; 2 wounds on the left anterior lower leg in the setting of what appears to be severe chronic venous insufficiency. We have been using Hydrofera Blue and 3 layer compression. 10/17/2020 upon evaluation today patient appears to be doing well with regard to her leg ulcer. She has been tolerating the dressing changes without complication. Fortunately there does not appear to be any signs of active infection at this time. No fevers, chills, nausea, vomiting, or diarrhea. Electronic Signature(s) Signed: 10/17/2020 9:16:04 AM By: Worthy Keeler PA-C Entered By: Worthy Keeler on 10/17/2020 09:16:04 Brauner, Agata M. ([id number]) -------------------------------------------------------------------------------- Physical Exam Details Patient Name: Janet [name]. Date of Service: 10/17/2020 8:30 AM Medical Record Number: [id number] Patient Account Number: [id number] Date of Birth/Sex: [birth year] (70 y.o. F) Treating RN: Donnamarie Poag Primary Care Provider: Delight Stare Other Clinician: Referring Provider: Delight Stare Treating Provider/Extender: Skipper Cliche in Treatment: 7 Constitutional Well-nourished and well-hydrated in no acute distress. Respiratory normal breathing without difficulty. Psychiatric this patient is able to make decisions and demonstrates good insight into disease process. Alert and Oriented x 3. pleasant and cooperative. Notes Upon inspection patient's wound bed showed signs of good granulation and epithelization at this point. Fortunately I do not see any evidence of worsening and she had very little drainage around the edges of the wound I did clean this away carefully with saline and gauze no sharp debridement was performed. The biggest issue I see right now she still having erythema and warmth which seems to be coming back on the right leg this has been much more concerned. I am not really able to get this completely cleared or at least having up to this point the Levaquin did help and she was feeling a lot better with that and then unfortunately it got worse again. With that being  said I do believe at this point that we probably want to repeat the Levaquin. Electronic Signature(s) Signed: 10/17/2020 9:16:47 AM By: Worthy Keeler PA-C Entered By: Worthy Keeler on 10/17/2020 09:16:46 Chiquito, Mayley M. ([id number]) -------------------------------------------------------------------------------- Physician Orders Details Patient Name: Janet [name]. Date of Service: 10/17/2020 8:30 AM Medical Record Number:  [id number] Patient Account Number: [id number] Date of Birth/Sex: Oct 17, 1950 (70 y.o. F) Treating RN: Donnamarie Poag Primary Care Provider: Delight Stare Other Clinician: Referring Provider: Delight Stare Treating Provider/Extender: Skipper Cliche in Treatment: 7 Verbal / Phone Orders: No Diagnosis Coding ICD-10 Coding Code Description I87.2 Venous insufficiency (chronic) (peripheral) I89.0 Lymphedema, not elsewhere classified 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 of other part of left lower leg with fat layer exposed L03.115 Cellulitis of right lower limb R22.41 Localized swelling, mass and lump, right lower limb Follow-up Appointments o Return Appointment in 1 week. o Nurse Visit as needed Bathing/ Shower/ Hygiene o May shower with wound dressing protected with water repellent cover or cast protector. o No tub bath. Edema Control - Lymphedema / Segmental Compressive Device / Other o Optional: One layer of unna paste to top of compression wrap (to act as an anchor). - left leg o Patient to wear own compression stockings. Remove compression stockings every night before going to bed and put on every morning when getting up. - right leg-see Elastic Therapy contact and measurements o Patient to wear own Velcro compression garment. Remove compression stockings every night before going to bed and put on every morning when getting up. - will be mailed to you, "JUXTELITE" o Elevate legs to the level of the heart and pump ankles as often as possible o Elevate leg(s) parallel to the floor when sitting. o DO YOUR BEST to sleep in the bed at night. DO NOT sleep in your recliner. Long hours of sitting in a recliner leads to swelling of the legs and/or potential wounds on your backside. Medications-Please add to medication list. o Take one 500mg  Tylenol (Acetaminophen) and one 200mg  Motrin (Ibuprofen) every 6 hours for pain. Do not take ibuprofen if you are on blood thinners or  have stomach ulcers. - as needed for pain if not contraindicated by other physician o P.O. Antibiotics - Pick up and start the Levaquin and take as prescribed Wound Treatment Wound #1 - Lower Leg Wound Laterality: Left, Anterior, Distal Peri-Wound Care: Triamcinolone Acetonide Cream, 0.1%, 15 (g) tube 1 x Per Week/30 Days Primary Dressing: Hydrofera Blue Ready Transfer Foam, 2.5x2.5 (in/in) 1 x Per Week/30 Days Discharge Instructions: Apply Hydrofera Blue Ready to wound bed as directed Compression Wrap: Profore Lite LF 3 Multilayer Compression Bandaging System 1 x Per Week/30 Days Discharge Instructions: Apply 3 multi-layer wrap as prescribed. Wound #2 - Lower Leg Wound Laterality: Left, Anterior, Proximal Topical: Triamcinolone Acetonide Cream, 0.1%, 15 (g) tube 1 x Per Week/30 Days Discharge Instructions: Apply as directed by provider. Primary Dressing: Hydrofera Blue Ready Transfer Foam, 2.5x2.5 (in/in) 1 x Per Week/30 Days Discharge Instructions: Apply Hydrofera Blue Ready to wound bed as directed Compression Wrap: Profore Lite LF 3 Multilayer Compression Bandaging System 1 x Per Week/30 Days Discharge Instructions: Apply 3 multi-layer wrap as prescribed. MIKAHLA, WISOR ([id number]) Patient Medications Allergies: penicillin Notifications Medication Indication Start End Levaquin 10/17/2020 DOSE 1 - oral 750 mg tablet - 1 tablet oral taken 1 time per day for 14 days Electronic Signature(s) Signed: 10/17/2020 9:17:53 AM By: Worthy Keeler PA-C Entered By: Worthy Keeler on 10/17/2020 09:17:53 Sherk, Charie  Jerilynn Mages ([id number]) -------------------------------------------------------------------------------- Problem List Details Patient Name: TEALA, DAFFRON. Date of Service: 10/17/2020 8:30 AM Medical Record Number: [id number] Patient Account Number: [id number] Date of Birth/Sex: 11-02-1950 (70 y.o. F) Treating RN: Donnamarie Poag Primary Care Provider: Delight Stare Other Clinician: Referring  Provider: Delight Stare Treating Provider/Extender: Skipper Cliche in Treatment: 7 Active Problems ICD-10 Encounter Code Description Active Date MDM Diagnosis I87.2 Venous insufficiency (chronic) (peripheral) 08/29/2020 No Yes I89.0 Lymphedema, not elsewhere classified 08/29/2020 No Yes 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 of other part of left lower leg with fat layer 08/29/2020 No Yes exposed L03.115 Cellulitis of right lower limb 09/05/2020 No Yes R22.41 Localized swelling, mass and lump, right lower limb 09/05/2020 No Yes Inactive Problems Resolved Problems Electronic Signature(s) Signed: 10/17/2020 9:01:35 AM By: Worthy Keeler PA-C Entered By: Worthy Keeler on 10/17/2020 09:01:35 Camino, Laurine M. ([id number]) -------------------------------------------------------------------------------- Progress Note Details Patient Name: Janet [name]. Date of Service: 10/17/2020 8:30 AM Medical Record Number: [id number] Patient Account Number: [id number] Date of Birth/Sex: 11/12/1950 (70 y.o. F) Treating RN: Donnamarie Poag Primary Care Provider: Delight Stare Other Clinician: Referring Provider: Delight Stare Treating Provider/Extender: Skipper Cliche in Treatment: 7 Subjective Chief Complaint Information obtained from Patient Left LE Ulcers History of Present Illness (HPI) 08/29/2020 upon evaluation today patient appears to be doing somewhat poorly in regard to what appeared to be venous leg ulcers on the left leg that she has been dealing with since around the beginning of May. Fortunately she does not have any signs of obvious infection unfortunately she does have quite a bit of necrotic tissue that does not really need to be debrided. She has been seeing vascular and they did wrap her for a time with what was likely an Haematologist. With that being said the [facility] boot has given her some trouble as far as feeling claustrophobic was concerned she is not sure if she is good to be able to tolerate a  compression wrap currently although we will get a try she is in agreement with Aleve given this shot. Fortunately I do not see any signs of active infection systemically which is great news and even locally she seems to be doing quite well today. Patient's Right ABI is 1.08 with a TBI of 0.79 and the left ABI is 1.15 with a TBI of 0.84 on 08/03/20. She also has a history of chronic venous insufficiency and lymphedema but otherwise no major medical problems. 09/05/2020 upon evaluation today patient unfortunately appears to be having some issues today with her right leg this is completely different from what we have been taking care of on the left. She actually has some swelling and an area of erythema and pain which is in the medial calf region. There is no identifiable abscess definitely this could just be cellulitis but with her history of DVT I think that we need to have this checked out. 09/12/2020 upon evaluation today patient appears to be doing better in regard to her wound. She has been tolerating the dressing changes without complication. Fortunately there does not appear to be any signs of active infection which is great news and overall I think that the patient is doing well in this regard. Her wounds are measuring smaller at both locations and the surface of the wound is looking better. With that being said she does have a wedding that she is supposed to be going to this weekend and states that she really needs to not have the wrap on for that.  Nonetheless regular and discuss options to try to help maintain while she has this time with the wedding. 09/20/2020 upon evaluation today patient unfortunately is doing worse in regard to the pain associated with her wound at this time. Fortunately there does not appear to be any evidence of active infection systemically which is great news but in general I feel like locally she may definitely be infected. Subsequently I do believe that we need to  address this as soon as possible. 09/26/2020 upon evaluation today patient appears to be doing well with regard to her wound. Fortunately there does not appear to be any signs of worsening in general. I think that overall the patient is making wonderful progress in my opinion. The right leg where she had an area of erythema medially also is better today which is great news. I think Levaquin has done a great job in that regard. We did see evidence still some erythema on the right leg but again this is significantly improved compared to last week. Overall the Levaquin has done a great job in this regard. 10/03/2020 upon evaluation today patient appears to be doing well all things considered with regard to her wounds. There does not appear to be any signs of infection obvious at this point which is great news. Fortunately I think that she is definitely making progress with regard to the appearance of the wound bed but nonetheless is still having quite a bit of an issue here with the pain aspect. Fortunately I think that we have some other options to consider him and use triamcinolone underneath the [facility] today. Also think looking into PuraPly/Apligraf as a treatment could be appropriate for this patient and my hope is that we will be able to get the wounds growing in a much faster pace as far as new tissue is concerned to get this closed sooner rather than later as that is the only way that we will get a get the pain completely resolved for her which is ultimately the goal we have as well obviously. 9/26; 2 wounds on the left anterior lower leg in the setting of what appears to be severe chronic venous insufficiency. We have been using Hydrofera Blue and 3 layer compression. 10/17/2020 upon evaluation today patient appears to be doing well with regard to her leg ulcer. She has been tolerating the dressing changes without complication. Fortunately there does not appear to be any signs of active  infection at this time. No fevers, chills, nausea, vomiting, or diarrhea. Objective Constitutional Well-nourished and well-hydrated in no acute distress. Vitals Time Taken: 8:38 AM, Height: 66 in, Weight: 192 lbs, BMI: 31, Temperature: 98.3 F, Pulse: 71 bpm, Respiratory Rate: 16 breaths/min, Corbridge, Lysette M. ([id number]) Blood Pressure: 166/77 mmHg. Respiratory normal breathing without difficulty. Psychiatric this patient is able to make decisions and demonstrates good insight into disease process. Alert and Oriented x 3. pleasant and cooperative. General Notes: Upon inspection patient's wound bed showed signs of good granulation and epithelization at this point. Fortunately I do not see any evidence of worsening and she had very little drainage around the edges of the wound I did clean this away carefully with saline and gauze no sharp debridement was performed. The biggest issue I see right now she still having erythema and warmth which seems to be coming back on the right leg this has been much more concerned. I am not really able to get this completely cleared or at least having up to this point the  Levaquin did help and she was feeling a lot better with that and then unfortunately it got worse again. With that being said I do believe at this point that we probably want to repeat the Levaquin. Integumentary (Hair, Skin) Wound #1 status is Open. Original cause of wound was Blister. The date acquired was: 05/15/2020. The wound has been in treatment 7 weeks. The wound is located on the [facility] Lower Leg. The wound measures 4cm length x 2.1cm width x 0.1cm depth; 6.597cm^2 area and 0.66cm^3 volume. There is Fat Layer (Subcutaneous Tissue) exposed. There is no tunneling or undermining noted. There is a medium amount of sanguinous drainage noted. There is large (67-100%) red, hyper - granulation within the wound bed. There is a small (1-33%) amount of necrotic tissue within the wound  bed including Adherent Slough. Wound #2 status is Open. Original cause of wound was Blister. The date acquired was: 05/15/2020. The wound has been in treatment 7 weeks. The wound is located on the Left,Proximal,Anterior Lower Leg. The wound measures 1.3cm length x 0.8cm width x 0.1cm depth; 0.817cm^2 area and 0.082cm^3 volume. There is Fat Layer (Subcutaneous Tissue) exposed. There is no tunneling or undermining noted. There is a medium amount of serosanguineous drainage noted. The wound margin is flat and intact. There is large (67-100%) red, hyper - granulation within the wound bed. There is a small (1-33%) amount of necrotic tissue within the wound bed including Adherent Slough. Assessment Active Problems ICD-10 Venous insufficiency (chronic) (peripheral) Lymphedema, not elsewhere classified Non-pressure chronic ulcer of other part of left lower leg with fat layer exposed Cellulitis of right lower limb Localized swelling, mass and lump, right lower limb Procedures Wound #1 Pre-procedure diagnosis of Wound #1 is a Venous Leg Ulcer located on the Left,Distal,Anterior Lower Leg . There was a Three Layer Compression Therapy Procedure by Donnamarie Poag, RN. Post procedure Diagnosis Wound #1: Same as Pre-Procedure Wound #2 Pre-procedure diagnosis of Wound #2 is a Venous Leg Ulcer located on the Left,Proximal,Anterior Lower Leg . There was a Three Layer Compression Therapy Procedure by Donnamarie Poag, RN. Post procedure Diagnosis Wound #2: Same as Pre-Procedure Plan Follow-up Appointments: Return Appointment in 1 week. Nurse Visit as needed Bathing/ Shower/ Hygiene: May shower with wound dressing protected with water repellent cover or cast protector. No tub bath. Darling, Bettey M. ([id number]) Edema Control - Lymphedema / Segmental Compressive Device / Other: Optional: One layer of unna paste to top of compression wrap (to act as an anchor). - left leg Patient to wear own compression stockings.  Remove compression stockings every night before going to bed and put on every morning when getting up. - right leg-see Elastic Therapy contact and measurements Patient to wear own Velcro compression garment. Remove compression stockings every night before going to bed and put on every morning when getting up. - will be mailed to you, "JUXTELITE" Elevate legs to the level of the heart and pump ankles as often as possible Elevate leg(s) parallel to the floor when sitting. DO YOUR BEST to sleep in the bed at night. DO NOT sleep in your recliner. Long hours of sitting in a recliner leads to swelling of the legs and/or potential wounds on your backside. Medications-Please add to medication list.: Take one 500mg  Tylenol (Acetaminophen) and one 200mg  Motrin (Ibuprofen) every 6 hours for pain. Do not take ibuprofen if you are on blood thinners or have stomach ulcers. - as needed for pain if not contraindicated by other physician P.O. Antibiotics - Pick  up and start the Levaquin and take as prescribed The following medication(s) was prescribed: Levaquin oral 750 mg tablet 1 1 tablet oral taken 1 time per day for 14 days starting 10/17/2020 WOUND #1: - Lower Leg Wound Laterality: Left, Anterior, Distal Peri-Wound Care: Triamcinolone Acetonide Cream, 0.1%, 15 (g) tube 1 x Per Week/30 Days Primary Dressing: Hydrofera Blue Ready Transfer Foam, 2.5x2.5 (in/in) 1 x Per Week/30 Days Discharge Instructions: Apply Hydrofera Blue Ready to wound bed as directed Compression Wrap: Profore Lite LF 3 Multilayer Compression Bandaging System 1 x Per Week/30 Days Discharge Instructions: Apply 3 multi-layer wrap as prescribed. WOUND #2: - Lower Leg Wound Laterality: Left, Anterior, Proximal Topical: Triamcinolone Acetonide Cream, 0.1%, 15 (g) tube 1 x Per Week/30 Days Discharge Instructions: Apply as directed by provider. Primary Dressing: Hydrofera Blue Ready Transfer Foam, 2.5x2.5 (in/in) 1 x Per Week/30  Days Discharge Instructions: Apply Hydrofera Blue Ready to wound bed as directed Compression Wrap: Profore Lite LF 3 Multilayer Compression Bandaging System 1 x Per Week/30 Days Discharge Instructions: Apply 3 multi-layer wrap as prescribed. 1. I am going to send in a repeat of the Levaquin today. I am going to switch it however to 750 mg for the dosage as I feel like she needs something a little bit stronger to try to knock this out. Hopefully this will completely cleared up the right leg and the irritation that she is having as such from that. 2. I am also can recommend that we have the patient go ahead and continue with the compression wraps as I feel like this is doing a great job at this point for her. 3. I am also can recommend that we have the patient continue to elevate her legs much as possible I also think compression socks for the right leg would be beneficial for her and she should be wearing that currently. We will see patient back for reevaluation in 1 week here in the clinic. If anything worsens or changes patient will contact our office for additional recommendations. Electronic Signature(s) Signed: 10/17/2020 9:18:20 AM By: Worthy Keeler PA-C Entered By: Worthy Keeler on 10/17/2020 09:18:20 Palazzi, Ajai M. ([id number]) -------------------------------------------------------------------------------- SuperBill Details Patient Name: Janet [name]. Date of Service: 10/17/2020 Medical Record Number: [id number] Patient Account Number: [id number] Date of Birth/Sex: June 26, 1950 (70 y.o. F) Treating RN: Donnamarie Poag Primary Care Provider: Delight Stare Other Clinician: Referring Provider: Delight Stare Treating Provider/Extender: Skipper Cliche in Treatment: 7 Diagnosis Coding ICD-10 Codes Code Description I87.2 Venous insufficiency (chronic) (peripheral) I89.0 Lymphedema, not elsewhere classified 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 of other part of left lower leg with fat layer  exposed L03.115 Cellulitis of right lower limb R22.41 Localized swelling, mass and lump, right lower limb Facility Procedures CPT4 Code: 74259563 Description: (Facility Use Only) [id number] 2150 - [facility] LWR LT LEG Modifier: Quantity: 1 Physician Procedures CPT4 Code: 4166063 Description: 01601 - WC PHYS LEVEL 4 - EST PT Modifier: Quantity: 1 CPT4 Code: Description: ICD-10 Diagnosis Description I87.2 Venous insufficiency (chronic) (peripheral) I89.0 Lymphedema, not elsewhere classified 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

## 2020-10-17 NOTE — Progress Notes (Signed)
SURA, CANUL ([id number]) Visit Report for 10/17/2020 Arrival Information Details Patient Name: Janet [name], Janet [name]. Date of Service: 10/17/2020 8:30 AM Medical Record Number: [id number] Patient Account Number: [id number] Date of Birth/Sex: 10-Mar-1950 (70 y.o. F) Treating RN: Donnamarie Poag Primary Care Jaisa Defino: Delight Stare Other Clinician: Referring Cleon Thoma: Delight Stare Treating Sterling Ucci/Extender: Skipper Cliche in Treatment: 7 Visit Information History Since Last Visit Added or deleted any medications: No Patient Arrived: Ambulatory Had a fall or experienced change in No Arrival Time: 08:35 activities of daily living that may affect Accompanied By: self risk of falls: Transfer Assistance: None Hospitalized since last visit: No Patient Identification Verified: Yes Has Dressing in Place as Prescribed: Yes Secondary Verification Process Completed: Yes Has Compression in Place as Prescribed: Yes Patient Has Alerts: Yes Pain Present Now: Yes Patient Alerts: Patient on Blood Thinner Aspirin 72m AVVS ABI/TBI 7/22 ABI L- 1.15/ R- 1.08 Electronic Signature(s) Signed: 10/17/2020 4:16:00 PM By: BDonnamarie PoagEntered By: BDonnamarie Poagon 10/17/2020 08:38:48 Garver, Inga M. ([id number] -------------------------------------------------------------------------------- Clinic Level of Care Assessment Details Patient Name: Janet [name] Date of Service: 10/17/2020 8:30 AM Medical Record Number: [id number] Account Number: [id number] of Birth/Sex: 207/03/1950(71y.o. F) Treating RN: BDonnamarie PoagPrimary Care Juliene Kirsh: MDelight StareOther Clinician: Referring Golden Gilreath: MDelight StareTreating Corbyn Steedman/Extender: SSkipper Clichein Treatment: 7 Clinic Level of Care Assessment Items TOOL 1 Quantity Score _0  - Use when EandM and Procedure is performed on INITIAL visit 0 ASSESSMENTS - Nursing Assessment / Reassessment _1  - General Physical Exam (combine w/ comprehensive assessment (listed  just below) when performed on new 0 pt. evals) _2  - 0 Comprehensive Assessment (HX, ROS, Risk Assessments, Wounds Hx, etc.) ASSESSMENTS - Wound and Skin Assessment / Reassessment _3  - Dermatologic / Skin Assessment (not related to wound area) 0 ASSESSMENTS - Ostomy and/or Continence Assessment and Care _4  - Incontinence Assessment and Management 0 _5  - 0 Ostomy Care Assessment and Management (repouching, etc.) PROCESS - Coordination of Care _6  - Simple Patient / Family Education for ongoing care 0 _7  - 0 Complex (extensive) Patient / Family Education for ongoing care _8  - 0 Staff obtains CProgrammer, systems Records, Test Results / Process Orders _9  - 0 Staff telephones HHA, Nursing Homes / Clarify orders / etc _10  - 0 Routine Transfer to another Facility (non-emergent condition) _11  - 0 Routine Hospital Admission (non-emergent condition) _12  - 0 New Admissions / IBiomedical engineer/ Ordering NPWT, Apligraf, etc. _13  - 0 Emergency Hospital Admission (emergent condition) PROCESS - Special Needs _14  - Pediatric / Minor Patient Management 0 _15  - 0 Isolation Patient Management _16  - 0 Hearing / Language / Visual special needs _17  - 0 Assessment of Community assistance (transportation, D/C planning, etc.) _18  - 0 Additional assistance / Altered mentation _19  - 0 Support Surface(s) Assessment (bed, cushion, seat, etc.) INTERVENTIONS - Miscellaneous _20  - External ear exam 0 _21  - 0 Patient Transfer (multiple staff / HCivil Service fast streamer/ Similar devices) _22  - 0 Simple Staple / Suture removal (25 or less) _23  - 0 Complex Staple / Suture removal (26 or more) _24  - 0 Hypo/Hyperglycemic Management (do not check if billed separately) _25  - 0 Ankle / Brachial Index (ABI) - do not check if billed separately Has the patient been seen at the hospital within the last three years: Yes Total Score: 0 Level Of Care: ____ Janet [name]([id number] Electronic Signature(s) Signed: 10/17/2020 4:16:00 PM By:  BDonnamarie PoagEntered By: BDonnamarie Poagon 10/17/2020 09:12:42 Mcelhaney, Jaylina M. (  [id number]) -------------------------------------------------------------------------------- Compression Therapy Details Patient Name: Janet [name]. Date of Service: 10/17/2020 8:30 AM Medical Record Number: [id number] Patient Account Number: [id number] Date of Birth/Sex: 1950-08-30 (70 y.o. F) Treating RN: Donnamarie Poag Primary Care Kensli Bowley: Delight Stare Other Clinician: Referring Andalyn Heckstall: Delight Stare Treating Orilla Templeman/Extender: Skipper Cliche in Treatment: 7 Compression Therapy Performed for Wound Assessment: Wound #1 Left,Distal,Anterior Lower Leg Performed By: Junius Argyle, RN Compression Type: Three Layer Post Procedure Diagnosis Same as Pre-procedure Electronic Signature(s) Signed: 10/17/2020 4:16:00 PM By: Donnamarie Poag Entered By: Donnamarie Poag on 10/17/2020 09:11:21 Huwe, Ameyah M. ([id number]) -------------------------------------------------------------------------------- Compression Therapy Details Patient Name: Ronnette [name]. Date of Service: 10/17/2020 8:30 AM Medical Record Number: [id number] Patient Account Number: [id number] Date of Birth/Sex: 1950-03-19 (70 y.o. F) Treating RN: Donnamarie Poag Primary Care Makella Buckingham: Delight Stare Other Clinician: Referring Shalene Gallen: Delight Stare Treating Heran Campau/Extender: Skipper Cliche in Treatment: 7 Compression Therapy Performed for Wound Assessment: Wound #2 Left,Proximal,Anterior Lower Leg Performed By: Junius Argyle, RN Compression Type: Three Layer Post Procedure Diagnosis Same as Pre-procedure Electronic Signature(s) Signed: 10/17/2020 4:16:00 PM By: Donnamarie Poag Entered By: Donnamarie Poag on 10/17/2020 09:11:21 [place], Maliya M. ([id number]) -------------------------------------------------------------------------------- Encounter Discharge Information Details Patient Name: Ronnette [name]. Date of Service: 10/17/2020 8:30 AM Medical Record  Number: [id number] Patient Account Number: [id number] Date of Birth/Sex: Aug 17, 1950 (70 y.o. F) Treating RN: Donnamarie Poag Primary Care Rumaisa Schnetzer: Delight Stare Other Clinician: Referring Jonay Hitchcock: Delight Stare Treating Kalee Mcclenathan/Extender: Skipper Cliche in Treatment: 7 Encounter Discharge Information Items Discharge Condition: Stable Ambulatory Status: Ambulatory Discharge Destination: Home Transportation: Private Auto Accompanied By: self Schedule Follow-up Appointment: Yes Clinical Summary of Care: Electronic Signature(s) Signed: 10/17/2020 4:16:00 PM By: Donnamarie Poag Entered By: Donnamarie Poag on 10/17/2020 09:20:30 Meek, Jessilynn M. ([id number]) -------------------------------------------------------------------------------- Lower Extremity Assessment Details Patient Name: Ronnette [name]. Date of Service: 10/17/2020 8:30 AM Medical Record Number: [id number] Patient Account Number: [id number] Date of Birth/Sex: Oct 01, 1950 (70 y.o. F) Treating RN: Donnamarie Poag Primary Care Taisa Deloria: Delight Stare Other Clinician: Referring Emelin Dascenzo: Delight Stare Treating Max Romano/Extender: Skipper Cliche in Treatment: 7 Edema Assessment Assessed: [Left: Yes] [Right: No] [Left: Edema] [Right: :] Calf Left: Right: Point of Measurement: 29 cm From Medial Instep 38 cm Ankle Left: Right: Point of Measurement: 10 cm From Medial Instep 24 cm Knee To Floor Left: Right: From Medial Instep 41 cm Vascular Assessment Pulses: Dorsalis Pedis Palpable: [Left:Yes] Electronic Signature(s) Signed: 10/17/2020 4:16:00 PM By: Donnamarie Poag Entered By: Donnamarie Poag on 10/17/2020 08:45:37 Sabbagh, Fontaine M. ([id number]) -------------------------------------------------------------------------------- Multi Wound Chart Details Patient Name: Ronnette [name]. Date of Service: 10/17/2020 8:30 AM Medical Record Number: [id number] Patient Account Number: [id number] Date of Birth/Sex: 09-02-1950 (70 y.o. F) Treating RN: Donnamarie Poag Primary Care Aasiyah Auerbach: Delight Stare Other Clinician: Referring Margaurite Salido: Delight Stare Treating Adyline Huberty/Extender: Skipper Cliche in Treatment: 7 Vital Signs Height(in): 54 Pulse(bpm): 70 Weight(lbs): 192 Blood Pressure(mmHg): 166/77 Body Mass Index(BMI): 31 Temperature(F): 98.3 Respiratory Rate(breaths/min): 16 Photos: [N/A:N/A] Wound Location: Left, Distal, Anterior Lower Leg Left, Proximal, Anterior Lower Leg N/A Wounding Event: Blister Blister N/A Primary Etiology: Venous Leg Ulcer Venous Leg Ulcer N/A Secondary Etiology: Lymphedema Lymphedema N/A Comorbid History: Asthma, Hypertension Asthma, Hypertension N/A Date Acquired: 05/15/2020 05/15/2020 N/A Weeks of Treatment: 7 7 N/A Wound Status: Open Open N/A Measurements L x W x D (cm) 4x2.1x0.1 1.3x0.8x0.1 N/A Area (cm) : 6.597 0.817 N/A Volume (cm) : 0.66 0.082 N/A % Reduction in Area: -10.50% 30.60% N/A % Reduction in Volume: -10.60% 30.50% N/A Classification:  Full Thickness Without Exposed Full Thickness Without Exposed N/A Support Structures Support Structures Exudate Amount: Medium Medium N/A Exudate Type: Sanguinous Serosanguineous N/A Exudate Color: red red, brown N/A Wound Margin: N/A Flat and Intact N/A Granulation Amount: Large (67-100%) Large (67-100%) N/A Granulation Quality: Red, Hyper-granulation Red, Hyper-granulation N/A Necrotic Amount: Small (1-33%) Small (1-33%) N/A Exposed Structures: Fat Layer (Subcutaneous Tissue): Fat Layer (Subcutaneous Tissue): N/A Yes Yes Fascia: No Fascia: No Tendon: No Tendon: No Muscle: No Muscle: No Joint: No Joint: No Bone: No Bone: No Epithelialization: None None N/A Treatment Notes Electronic Signature(s) Signed: 10/17/2020 4:16:00 PM By: Donnamarie Poag Entered By: Donnamarie Poag on 10/17/2020 08:46:03 Verner, Latarsha M. ([id number]) -------------------------------------------------------------------------------- [place] Details Patient Name:  Ronnette [name]. Date of Service: 10/17/2020 8:30 AM Medical Record Number: [id number] Patient Account Number: [id number] Date of Birth/Sex: 1950-06-17 (70 y.o. F) Treating RN: Donnamarie Poag Primary Care Tayna Smethurst: Delight Stare Other Clinician: Referring Glendora Clouatre: Delight Stare Treating Mazikeen Hehn/Extender: Skipper Cliche in Treatment: 7 Active Inactive Wound/Skin Impairment Nursing Diagnoses: Impaired tissue integrity Knowledge deficit related to smoking impact on wound healing Knowledge deficit related to ulceration/compromised skin integrity Goals: Patient/caregiver will verbalize understanding of skin care regimen Date Initiated: 08/29/2020 Date Inactivated: 09/20/2020 Target Resolution Date: 09/14/2020 Goal Status: Met Ulcer/skin breakdown will have a volume reduction of 30% by week 4 Date Initiated: 08/29/2020 Date Inactivated: 10/17/2020 Target Resolution Date: 09/29/2020 Goal Status: Met Ulcer/skin breakdown will have a volume reduction of 50% by week 8 Date Initiated: 08/29/2020 Target Resolution Date: 10/29/2020 Goal Status: Active Ulcer/skin breakdown will have a volume reduction of 80% by week 12 Date Initiated: 08/29/2020 Target Resolution Date: 11/29/2020 Goal Status: Active Ulcer/skin breakdown will heal within 14 weeks Date Initiated: 08/29/2020 Target Resolution Date: 12/14/2020 Goal Status: Active Interventions: Assess patient/caregiver ability to obtain necessary supplies Assess patient/caregiver ability to perform ulcer/skin care regimen upon admission and as needed Assess ulceration(s) every visit Provide education on ulcer and skin care Notes: Electronic Signature(s) Signed: 10/17/2020 4:16:00 PM By: Donnamarie Poag Entered By: Donnamarie Poag on 10/17/2020 08:45:53 Albert, Addisyn M. ([id number]) -------------------------------------------------------------------------------- Pain Assessment Details Patient Name: Ronnette [name]. Date of Service: 10/17/2020 8:30 AM Medical  Record Number: [id number] Patient Account Number: [id number] Date of Birth/Sex: 22-May-1950 (70 y.o. F) Treating RN: Donnamarie Poag Primary Care Rahm Minix: Delight Stare Other Clinician: Referring Reita Shindler: Delight Stare Treating Keiandra Sullenger/Extender: Skipper Cliche in Treatment: 7 Active Problems Location of Pain Severity and Description of Pain Patient Has Paino Yes Site Locations Pain Location: Pain in Ulcers Rate the pain. Current Pain Level: 7 Pain Management and Medication Current Pain Management: Electronic Signature(s) Signed: 10/17/2020 4:16:00 PM By: Donnamarie Poag Entered By: Donnamarie Poag on 10/17/2020 08:39:18 [place], [place] ([id number]) -------------------------------------------------------------------------------- Patient/Caregiver Education Details Patient Name: Ronnette [name]. Date of Service: 10/17/2020 8:30 AM Medical Record Number: [id number] Patient Account Number: [id number] Date of Birth/Gender: 15-Nov-1950 (70 y.o. F) Treating RN: Donnamarie Poag Primary Care Physician: Delight Stare Other Clinician: Referring Physician: Delight Stare Treating Physician/Extender: Skipper Cliche in Treatment: 7 Education Assessment Education Provided To: Patient Education Topics Provided Wound/Skin Impairment: Electronic Signature(s) Signed: 10/17/2020 4:16:00 PM By: Donnamarie Poag Entered By: Donnamarie Poag on 10/17/2020 09:19:49 Holm, Bradee M. ([id number]) -------------------------------------------------------------------------------- Wound Assessment Details Patient Name: Ronnette [name]. Date of Service: 10/17/2020 8:30 AM Medical Record Number: [id number] Patient Account Number: [id number] Date of Birth/Sex: 13-Mar-1950 (70 y.o. F) Treating RN: Donnamarie Poag Primary Care Yomara Toothman: Delight Stare Other Clinician: Referring Amma Crear: Delight Stare Treating Amori Cooperman/Extender: Skipper Cliche  in Treatment: 7 Wound Status Wound Number: 1 Primary Etiology: Venous Leg Ulcer Wound Location: Left,  Distal, Anterior Lower Leg Secondary Etiology: Lymphedema Wounding Event: Blister Wound Status: Open Date Acquired: 05/15/2020 Comorbid History: Asthma, Hypertension Weeks Of Treatment: 7 Clustered Wound: No Photos Wound Measurements Length: (cm) 4 Width: (cm) 2.1 Depth: (cm) 0.1 Area: (cm) 6.597 Volume: (cm) 0.66 % Reduction in Area: -10.5% % Reduction in Volume: -10.6% Epithelialization: None Tunneling: No Undermining: No Wound Description Classification: Full Thickness Without Exposed Support Structu Exudate Amount: Medium Exudate Type: Sanguinous Exudate Color: red res Foul Odor After Cleansing: No Slough/Fibrino Yes Wound Bed Granulation Amount: Large (67-100%) Exposed Structure Granulation Quality: Red, Hyper-granulation Fascia Exposed: No Necrotic Amount: Small (1-33%) Fat Layer (Subcutaneous Tissue) Exposed: Yes Necrotic Quality: Adherent Slough Tendon Exposed: No Muscle Exposed: No Joint Exposed: No Bone Exposed: No Treatment Notes Wound #1 (Lower Leg) Wound Laterality: Left, Anterior, Distal Cleanser Peri-Wound Care Triamcinolone Acetonide Cream, 0.1%, 15 (g) tube Topical Dase, Jadyn M. ([id number]) Primary Dressing Hydrofera Blue Ready Transfer Foam, 2.5x2.5 (in/in) Discharge Instruction: Apply Hydrofera Blue Ready to wound bed as directed Secondary Dressing Secured With Compression Wrap Profore Lite LF 3 Multilayer Compression Bandaging System Discharge Instruction: Apply 3 multi-layer wrap as prescribed. Compression Stockings Add-Ons Electronic Signature(s) Signed: 10/17/2020 4:16:00 PM By: Donnamarie Poag Entered By: Donnamarie Poag on 10/17/2020 08:44:43 Shaheed, Zarria M. ([id number]) -------------------------------------------------------------------------------- Wound Assessment Details Patient Name: Ronnette [name]. Date of Service: 10/17/2020 8:30 AM Medical Record Number: [id number] Patient Account Number: [id number] Date of Birth/Sex: 03/26/1950 (70  y.o. F) Treating RN: Donnamarie Poag Primary Care Bonner Larue: Delight Stare Other Clinician: Referring Cintya Daughety: Delight Stare Treating Bryahna Lesko/Extender: Skipper Cliche in Treatment: 7 Wound Status Wound Number: 2 Primary Etiology: Venous Leg Ulcer Wound Location: Left, Proximal, Anterior Lower Leg Secondary Etiology: Lymphedema Wounding Event: Blister Wound Status: Open Date Acquired: 05/15/2020 Comorbid History: Asthma, Hypertension Weeks Of Treatment: 7 Clustered Wound: No Photos Wound Measurements Length: (cm) 1.3 Width: (cm) 0.8 Depth: (cm) 0.1 Area: (cm) 0.817 Volume: (cm) 0.082 % Reduction in Area: 30.6% % Reduction in Volume: 30.5% Epithelialization: None Tunneling: No Undermining: No Wound Description Classification: Full Thickness Without Exposed Support Structu Wound Margin: Flat and Intact Exudate Amount: Medium Exudate Type: Serosanguineous Exudate Color: red, brown res Foul Odor After Cleansing: No Slough/Fibrino Yes Wound Bed Granulation Amount: Large (67-100%) Exposed Structure Granulation Quality: Red, Hyper-granulation Fascia Exposed: No Necrotic Amount: Small (1-33%) Fat Layer (Subcutaneous Tissue) Exposed: Yes Necrotic Quality: Adherent Slough Tendon Exposed: No Muscle Exposed: No Joint Exposed: No Bone Exposed: No Treatment Notes Wound #2 (Lower Leg) Wound Laterality: Left, Anterior, Proximal Cleanser Peri-Wound Care Topical Triamcinolone Acetonide Cream, 0.1%, 15 (g) tube [place], Odie M. ([id number]) Discharge Instruction: Apply as directed by Nandita Mathenia. Primary Dressing Hydrofera Blue Ready Transfer Foam, 2.5x2.5 (in/in) Discharge Instruction: Apply Hydrofera Blue Ready to wound bed as directed Secondary Dressing Secured With Compression Wrap Profore Lite LF 3 Multilayer Compression Bandaging System Discharge Instruction: Apply 3 multi-layer wrap as prescribed. Compression Stockings Add-Ons Electronic Signature(s) Signed: 10/17/2020  4:16:00 PM By: Donnamarie Poag Entered By: Donnamarie Poag on 10/17/2020 08:44:01 [place], Isabelle M. ([id number]) -------------------------------------------------------------------------------- Vitals Details Patient Name: Ronnette [name]. Date of Service: 10/17/2020 8:30 AM Medical Record Number: [id number] Patient Account Number: [id number] Date of Birth/Sex: 1950/04/06 (70 y.o. F) Treating RN: Donnamarie Poag Primary Care Kamyah Wilhelmsen: Delight Stare Other Clinician: Referring Dmarius Reeder: Delight Stare Treating Lache Dagher/Extender: Skipper Cliche in Treatment: 7 Vital Signs Time Taken: 08:38 Temperature (F): 98.3 Height (in): 66 Pulse (  bpm): 71 Weight (lbs): 192 Respiratory Rate (breaths/min): 16 Body Mass Index (BMI): 31 Blood Pressure (mmHg): 166/77 Reference Range: 80 - 120 mg / dl Electronic Signature(s) Signed: 10/17/2020 4:16:00 PM By: Donnamarie Poag Entered ByDonnamarie Poag on 10/17/2020 08:39:10

## 2020-10-24 ENCOUNTER — Other Ambulatory Visit: Payer: Self-pay

## 2020-10-24 ENCOUNTER — Encounter: Payer: Medicare Other | Admitting: Internal Medicine

## 2020-10-24 DIAGNOSIS — L97822 Non-pressure chronic ulcer of other part of left lower leg with fat layer exposed: Secondary | ICD-10-CM | POA: Diagnosis not present

## 2020-10-24 NOTE — Progress Notes (Signed)
JAIME, DOME ([id number]) Visit Report for 10/24/2020 HPI Details Patient Name: Janet [name], Janet [name]. Date of Service: 10/24/2020 9:30 AM Medical Record Number: [id number] Patient Account Number: [id number] Date of Birth/Sex: 11-16-1950 (70 y.o. F) Treating RN: Donnamarie Poag Primary Care Provider: Delight Stare Other Clinician: Referring Provider: Delight Stare Treating Provider/Extender: Tito Dine in Treatment: 8 History of Present Illness HPI Description: 08/29/2020 upon evaluation today patient appears to be doing somewhat poorly in regard to what appeared to be venous leg ulcers on the left leg that she has been dealing with since around the beginning of May. Fortunately she does not have any signs of obvious infection unfortunately she does have quite a bit of necrotic tissue that does not really need to be debrided. She has been seeing vascular and they did wrap her for a time with what was likely an Haematologist. With that being said the [facility] boot has given her some trouble as far as feeling claustrophobic was concerned she is not sure if she is good to be able to tolerate a compression wrap currently although we will get a try she is in agreement with Aleve given this shot. Fortunately I do not see any signs of active infection systemically which is great news and even locally she seems to be doing quite well today. Patient's Right ABI is 1.08 with a TBI of 0.79 and the left ABI is 1.15 with a TBI of 0.84 on 08/03/20. She also has a history of chronic venous insufficiency and lymphedema but otherwise no major medical problems. 09/05/2020 upon evaluation today patient unfortunately appears to be having some issues today with her right leg this is completely different from what we have been taking care of on the left. She actually has some swelling and an area of erythema and pain which is in the medial calf region. There is no identifiable abscess definitely this could just be cellulitis  but with her history of DVT I think that we need to have this checked out. 09/12/2020 upon evaluation today patient appears to be doing better in regard to her wound. She has been tolerating the dressing changes without complication. Fortunately there does not appear to be any signs of active infection which is great news and overall I think that the patient is doing well in this regard. Her wounds are measuring smaller at both locations and the surface of the wound is looking better. With that being said she does have a wedding that she is supposed to be going to this weekend and states that she really needs to not have the wrap on for that. Nonetheless regular and discuss options to try to help maintain while she has this time with the wedding. 09/20/2020 upon evaluation today patient unfortunately is doing worse in regard to the pain associated with her wound at this time. Fortunately there does not appear to be any evidence of active infection systemically which is great news but in general I feel like locally she may definitely be infected. Subsequently I do believe that we need to address this as soon as possible. 09/26/2020 upon evaluation today patient appears to be doing well with regard to her wound. Fortunately there does not appear to be any signs of worsening in general. I think that overall the patient is making wonderful progress in my opinion. The right leg where she had an area of erythema medially also is better today which is great news. I think Levaquin has done a great job  in that regard. We did see evidence still some erythema on the right leg but again this is significantly improved compared to last week. Overall the Levaquin has done a great job in this regard. 10/03/2020 upon evaluation today patient appears to be doing well all things considered with regard to her wounds. There does not appear to be any signs of infection obvious at this point which is great news. Fortunately I  think that she is definitely making progress with regard to the appearance of the wound bed but nonetheless is still having quite a bit of an issue here with the pain aspect. Fortunately I think that we have some other options to consider him and use triamcinolone underneath the [facility] today. Also think looking into PuraPly/Apligraf as a treatment could be appropriate for this patient and my hope is that we will be able to get the wounds growing in a much faster pace as far as new tissue is concerned to get this closed sooner rather than later as that is the only way that we will get a get the pain completely resolved for her which is ultimately the goal we have as well obviously. 9/26; 2 wounds on the left anterior lower leg in the setting of what appears to be severe chronic venous insufficiency. We have been using Hydrofera Blue and 3 layer compression. 10/17/2020 upon evaluation today patient appears to be doing well with regard to her leg ulcer. She has been tolerating the dressing changes without complication. Fortunately there does not appear to be any signs of active infection at this time. No fevers, chills, nausea, vomiting, or diarrhea. 10/10; 2 wounds on the left lateral calf. She has been using Hydrofera Blue. Still complains of pain. She is on her second week of that was started last week. Electronic Signature(s) Signed: 10/24/2020 5:14:01 PM By: Linton Ham MD Entered By: Linton Ham on 10/24/2020 09:56:56 [name], [name] ([id number]) -------------------------------------------------------------------------------- Physical Exam Details Patient Name: Janet [name]. Date of Service: 10/24/2020 9:30 AM Medical Record Number: [id number] Patient Account Number: [id number] Date of Birth/Sex: Aug 24, 1950 (70 y.o. F) Treating RN: Donnamarie Poag Primary Care Provider: Delight Stare Other Clinician: Referring Provider: Delight Stare Treating Provider/Extender: Tito Dine in Treatment: 8 Constitutional Patient is hypertensive.. Pulse regular and within target range for patient.Marland Kitchen Respirations regular, non-labored and within target range.. Temperature is normal and within the target range for the patient.Marland Kitchen appears in no distress. Notes Wound exam; under illumination surface looks viable on the superior wound not as good on the inferior area. I used a curette to touch the surface just to see if there was a fibrinous debris on the surface and it did not seem like there was. No obvious signs of infection. Severe skin changes of chronic venous insufficiency on both sides Electronic Signature(s) Signed: 10/24/2020 5:14:01 PM By: Linton Ham MD Entered By: Linton Ham on 10/24/2020 09:58:18 [name], [name] ([id number]) -------------------------------------------------------------------------------- Physician Orders Details Patient Name: Janet [name]. Date of Service: 10/24/2020 9:30 AM Medical Record Number: [id number] Patient Account Number: [id number] Date of Birth/Sex: 1950/11/20 (70 y.o. F) Treating RN: Donnamarie Poag Primary Care Provider: Delight Stare Other Clinician: Referring Provider: Delight Stare Treating Provider/Extender: Tito Dine in Treatment: 8 Verbal / Phone Orders: No Diagnosis Coding Follow-up Appointments o Return Appointment in 1 week. o Nurse Visit as needed Bathing/ Shower/ Hygiene o May shower with wound dressing protected with water repellent cover or cast protector. o No tub bath. Edema Control -  Lymphedema / Segmental Compressive Device / Other o Optional: One layer of unna paste to top of compression wrap (to act as an anchor). - left leg o Patient to wear own compression stockings. Remove compression stockings every night before going to bed and put on every morning when getting up. - right leg-see Elastic Therapy contact and measurements o Patient to wear own Velcro compression garment.  Remove compression stockings every night before going to bed and put on every morning when getting up. - will be mailed to you, "JUXTELITE" o Elevate legs to the level of the heart and pump ankles as often as possible o Elevate leg(s) parallel to the floor when sitting. o DO YOUR BEST to sleep in the bed at night. DO NOT sleep in your recliner. Long hours of sitting in a recliner leads to swelling of the legs and/or potential wounds on your backside. Medications-Please add to medication list. o Take one 500mg  Tylenol (Acetaminophen) and one 200mg  Motrin (Ibuprofen) every 6 hours for pain. Do not take ibuprofen if you are on blood thinners or have stomach ulcers. - as needed for pain if not contraindicated by other physician o P.O. Antibiotics - Continue the Levaquin and take as prescribed Wound Treatment Wound #1 - Lower Leg Wound Laterality: Left, Anterior, Distal Peri-Wound Care: Triamcinolone Acetonide Cream, 0.1%, 15 (g) tube 1 x Per Week/30 Days Primary Dressing: Hydrofera Blue Ready Transfer Foam, 2.5x2.5 (in/in) 1 x Per Week/30 Days Discharge Instructions: Apply Hydrofera Blue Ready to wound bed as directed Compression Wrap: Profore Lite LF 3 Multilayer Compression Bandaging System 1 x Per Week/30 Days Discharge Instructions: Apply 3 multi-layer wrap as prescribed. Wound #2 - Lower Leg Wound Laterality: Left, Anterior, Proximal Topical: Triamcinolone Acetonide Cream, 0.1%, 15 (g) tube 1 x Per Week/30 Days Discharge Instructions: Apply as directed by provider. Primary Dressing: Hydrofera Blue Ready Transfer Foam, 2.5x2.5 (in/in) 1 x Per Week/30 Days Discharge Instructions: Apply Hydrofera Blue Ready to wound bed as directed Compression Wrap: Profore Lite LF 3 Multilayer Compression Bandaging System 1 x Per Week/30 Days Discharge Instructions: Apply 3 multi-layer wrap as prescribed. Electronic Signature(s) Signed: 10/24/2020 3:42:16 PM By: Donnamarie Poag Signed: 10/24/2020  5:14:01 PM By: Linton Ham MD Entered By: Donnamarie Poag on 10/24/2020 09:57:33 [name], [name] ([id number]) -------------------------------------------------------------------------------- Problem List Details Patient Name: Janet [name]. Date of Service: 10/24/2020 9:30 AM Medical Record Number: [id number] Patient Account Number: [id number] Date of Birth/Sex: 30-Mar-1950 (70 y.o. F) Treating RN: Donnamarie Poag Primary Care Provider: Delight Stare Other Clinician: Referring Provider: Delight Stare Treating Provider/Extender: Tito Dine in Treatment: 8 Active Problems ICD-10 Encounter Code Description Active Date MDM Diagnosis I87.2 Venous insufficiency (chronic) (peripheral) 08/29/2020 No Yes I89.0 Lymphedema, not elsewhere classified 08/29/2020 No Yes 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 of other part of left lower leg with fat layer 08/29/2020 No Yes exposed Inactive Problems ICD-10 Code Description Active Date Inactive Date L03.115 Cellulitis of right lower limb 09/05/2020 09/05/2020 R22.41 Localized swelling, mass and lump, right lower limb 09/05/2020 09/05/2020 Resolved Problems Electronic Signature(s) Signed: 10/24/2020 5:14:01 PM By: Linton Ham MD Entered By: Linton Ham on 10/24/2020 09:55:20 [name], [name] ([id number]) -------------------------------------------------------------------------------- Progress Note Details Patient Name: Janet [name]. Date of Service: 10/24/2020 9:30 AM Medical Record Number: [id number] Patient Account Number: [id number] Date of Birth/Sex: 11-29-1950 (70 y.o. F) Treating RN: Donnamarie Poag Primary Care Provider: Delight Stare Other Clinician: Referring Provider: Delight Stare Treating Provider/Extender: Tito Dine in Treatment: 8 Subjective History of Present Illness (HPI)  08/29/2020 upon evaluation today patient appears to be doing somewhat poorly in regard to what appeared to be venous leg ulcers on the left leg that  she has been dealing with since around the beginning of May. Fortunately she does not have any signs of obvious infection unfortunately she does have quite a bit of necrotic tissue that does not really need to be debrided. She has been seeing vascular and they did wrap her for a time with what was likely an Haematologist. With that being said the [facility] boot has given her some trouble as far as feeling claustrophobic was concerned she is not sure if she is good to be able to tolerate a compression wrap currently although we will get a try she is in agreement with Aleve given this shot. Fortunately I do not see any signs of active infection systemically which is great news and even locally she seems to be doing quite well today. Patient's Right ABI is 1.08 with a TBI of 0.79 and the left ABI is 1.15 with a TBI of 0.84 on 08/03/20. She also has a history of chronic venous insufficiency and lymphedema but otherwise no major medical problems. 09/05/2020 upon evaluation today patient unfortunately appears to be having some issues today with her right leg this is completely different from what we have been taking care of on the left. She actually has some swelling and an area of erythema and pain which is in the medial calf region. There is no identifiable abscess definitely this could just be cellulitis but with her history of DVT I think that we need to have this checked out. 09/12/2020 upon evaluation today patient appears to be doing better in regard to her wound. She has been tolerating the dressing changes without complication. Fortunately there does not appear to be any signs of active infection which is great news and overall I think that the patient is doing well in this regard. Her wounds are measuring smaller at both locations and the surface of the wound is looking better. With that being said she does have a wedding that she is supposed to be going to this weekend and states that she really needs to not  have the wrap on for that. Nonetheless regular and discuss options to try to help maintain while she has this time with the wedding. 09/20/2020 upon evaluation today patient unfortunately is doing worse in regard to the pain associated with her wound at this time. Fortunately there does not appear to be any evidence of active infection systemically which is great news but in general I feel like locally she may definitely be infected. Subsequently I do believe that we need to address this as soon as possible. 09/26/2020 upon evaluation today patient appears to be doing well with regard to her wound. Fortunately there does not appear to be any signs of worsening in general. I think that overall the patient is making wonderful progress in my opinion. The right leg where she had an area of erythema medially also is better today which is great news. I think Levaquin has done a great job in that regard. We did see evidence still some erythema on the right leg but again this is significantly improved compared to last week. Overall the Levaquin has done a great job in this regard. 10/03/2020 upon evaluation today patient appears to be doing well all things considered with regard to her wounds. There does not appear to be any signs of infection obvious at  this point which is great news. Fortunately I think that she is definitely making progress with regard to the appearance of the wound bed but nonetheless is still having quite a bit of an issue here with the pain aspect. Fortunately I think that we have some other options to consider him and use triamcinolone underneath the [facility] today. Also think looking into PuraPly/Apligraf as a treatment could be appropriate for this patient and my hope is that we will be able to get the wounds growing in a much faster pace as far as new tissue is concerned to get this closed sooner rather than later as that is the only way that we will get a get the pain completely  resolved for her which is ultimately the goal we have as well obviously. 9/26; 2 wounds on the left anterior lower leg in the setting of what appears to be severe chronic venous insufficiency. We have been using Hydrofera Blue and 3 layer compression. 10/17/2020 upon evaluation today patient appears to be doing well with regard to her leg ulcer. She has been tolerating the dressing changes without complication. Fortunately there does not appear to be any signs of active infection at this time. No fevers, chills, nausea, vomiting, or diarrhea. 10/10; 2 wounds on the left lateral calf. She has been using Hydrofera Blue. Still complains of pain. She is on her second week of that was started last week. Objective Constitutional Patient is hypertensive.. Pulse regular and within target range for patient.Marland Kitchen Respirations regular, non-labored and within target range.. Temperature is normal and within the target range for the patient.Marland Kitchen appears in no distress. Vitals Time Taken: 9:31 AM, Height: 66 in, Weight: 192 lbs, BMI: 31, Temperature: 98.2 F, Pulse: 79 bpm, Respiratory Rate: 16 breaths/min, Blood Pressure: 175/78 mmHg. [name], [name] ([id number]) General Notes: Wound exam; under illumination surface looks viable on the superior wound not as good on the inferior area. I used a curette to touch the surface just to see if there was a fibrinous debris on the surface and it did not seem like there was. No obvious signs of infection. Severe skin changes of chronic venous insufficiency on both sides Integumentary (Hair, Skin) Wound #1 status is Open. Original cause of wound was Blister. The date acquired was: 05/15/2020. The wound has been in treatment 8 weeks. The wound is located on the [facility] Lower Leg. The wound measures 4cm length x 2.1cm width x 0.1cm depth; 6.597cm^2 area and 0.66cm^3 volume. There is Fat Layer (Subcutaneous Tissue) exposed. There is no tunneling or undermining noted.  There is a medium amount of serosanguineous drainage noted. There is large (67-100%) red, hyper - granulation within the wound bed. There is a small (1-33%) amount of necrotic tissue within the wound bed including Adherent Slough. Wound #2 status is Open. Original cause of wound was Blister. The date acquired was: 05/15/2020. The wound has been in treatment 8 weeks. The wound is located on the Left,Proximal,Anterior Lower Leg. The wound measures 1.4cm length x 1cm width x 0.1cm depth; 1.1cm^2 area and 0.11cm^3 volume. There is Fat Layer (Subcutaneous Tissue) exposed. There is no tunneling or undermining noted. There is a medium amount of serosanguineous drainage noted. The wound margin is flat and intact. There is large (67-100%) red, hyper - granulation within the wound bed. There is a small (1-33%) amount of necrotic tissue within the wound bed including Adherent Slough. Assessment Active Problems ICD-10 Venous insufficiency (chronic) (peripheral) Lymphedema, not elsewhere classified Non-pressure chronic ulcer  of other part of left lower leg with fat layer exposed Procedures Wound #1 Pre-procedure diagnosis of Wound #1 is a Venous Leg Ulcer located on the Left,Distal,Anterior Lower Leg . There was a Three Layer Compression Therapy Procedure by Donnamarie Poag, RN. Post procedure Diagnosis Wound #1: Same as Pre-Procedure Plan Follow-up Appointments: Return Appointment in 1 week. Nurse Visit as needed Bathing/ Shower/ Hygiene: May shower with wound dressing protected with water repellent cover or cast protector. No tub bath. Edema Control - Lymphedema / Segmental Compressive Device / Other: Optional: One layer of unna paste to top of compression wrap (to act as an anchor). - left leg Patient to wear own compression stockings. Remove compression stockings every night before going to bed and put on every morning when getting up. - right leg-see Elastic Therapy contact and measurements Patient  to wear own Velcro compression garment. Remove compression stockings every night before going to bed and put on every morning when getting up. - will be mailed to you, "JUXTELITE" Elevate legs to the level of the heart and pump ankles as often as possible Elevate leg(s) parallel to the floor when sitting. DO YOUR BEST to sleep in the bed at night. DO NOT sleep in your recliner. Long hours of sitting in a recliner leads to swelling of the legs and/or potential wounds on your backside. Medications-Please add to medication list.: Take one 500mg  Tylenol (Acetaminophen) and one 200mg  Motrin (Ibuprofen) every 6 hours for pain. Do not take ibuprofen if you are on blood thinners or have stomach ulcers. - as needed for pain if not contraindicated by other physician P.O. Antibiotics - Continue the Levaquin and take as prescribed WOUND #1: - Lower Leg Wound Laterality: Left, Anterior, Distal Peri-Wound Care: Triamcinolone Acetonide Cream, 0.1%, 15 (g) tube 1 x Per Week/30 Days Primary Dressing: Hydrofera Blue Ready Transfer Foam, 2.5x2.5 (in/in) 1 x Per Week/30 Days Discharge Instructions: Apply Hydrofera Blue Ready to wound bed as directed Compression Wrap: Profore Lite LF 3 Multilayer Compression Bandaging System 1 x Per Week/30 Days [name], [name] ([id number]) Discharge Instructions: Apply 3 multi-layer wrap as prescribed. WOUND #2: - Lower Leg Wound Laterality: Left, Anterior, Proximal Topical: Triamcinolone Acetonide Cream, 0.1%, 15 (g) tube 1 x Per Week/30 Days Discharge Instructions: Apply as directed by provider. Primary Dressing: Hydrofera Blue Ready Transfer Foam, 2.5x2.5 (in/in) 1 x Per Week/30 Days Discharge Instructions: Apply Hydrofera Blue Ready to wound bed as directed Compression Wrap: Profore Lite LF 3 Multilayer Compression Bandaging System 1 x Per Week/30 Days Discharge Instructions: Apply 3 multi-layer wrap as prescribed. #1 no major change. 2. Did not seem to be a debris issue on  the surface and at least today no obvious evidence of cellulitis although she is on Levaquin. 3. Consider MolecuLight next week although it will be virtually impossible to debride this woman. 4. Would also wonder about advanced treatment options or failing that Sorbact Electronic Signature(s) Signed: 10/24/2020 5:14:01 PM By: Linton Ham MD Entered By: Linton Ham on 10/24/2020 09:59:33 [name], [name] ([id number]) -------------------------------------------------------------------------------- SuperBill Details Patient Name: Janet [name]. Date of Service: 10/24/2020 Medical Record Number: [id number] Patient Account Number: [id number] Date of Birth/Sex: 1951/01/15 (70 y.o. F) Treating RN: Donnamarie Poag Primary Care Provider: Delight Stare Other Clinician: Referring Provider: Delight Stare Treating Provider/Extender: Tito Dine in Treatment: 8 Diagnosis Coding ICD-10 Codes Code Description I87.2 Venous insufficiency (chronic) (peripheral) I89.0 Lymphedema, not elsewhere classified 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 of other part of left lower leg with fat  layer exposed Facility Procedures CPT4 Code: [id number] Description: (Facility Use Only) [phone] - [place] COMPRS LWR LT LEG Modifier: Quantity: 1 Physician Procedures CPT4 Code: [id number] Description: 03546 - WC PHYS LEVEL 3 - EST PT Modifier: Quantity: 1 CPT4 Code: Description: ICD-10 Diagnosis Description 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 of other part of left lower leg with fat lay I87.2 Venous insufficiency (chronic) (peripheral) I89.0 Lymphedema, not elsewhere classified Modifier: er exposed Quantity: Electronic Signature(s) Signed: 10/24/2020 3:42:16 PM By: Donnamarie Poag Signed: 10/24/2020 5:14:01 PM By: Linton Ham MD Entered By: Donnamarie Poag on 10/24/2020 10:05:15

## 2020-10-24 NOTE — Progress Notes (Signed)
Janet [name], Janet [name] ([id number]) Visit Report for 10/24/2020 Arrival Information Details Patient Name: Janet [name], Janet [name]. Date of Service: 10/24/2020 9:30 AM Medical Record Number: [id number] Patient Account Number: [id number] Date of Birth/Sex: July 14, 1950 (70 y.o. F) Treating RN: Janet [name] Primary Care Janet [name]: Janet [name] Other Clinician: Referring Janet [name]: Janet [name] Treating Janet [name]/Extender: Janet [name] in Treatment: 8 Visit Information History Since Last Visit Added or deleted any medications: No Patient Arrived: Ambulatory Had a fall or experienced change in No Arrival Time: 09:30 activities of daily living that may affect Accompanied By: self risk of falls: Transfer Assistance: None Hospitalized since last visit: No Patient Identification Verified: Yes Has Dressing in Place as Prescribed: Yes Secondary Verification Process Completed: Yes Has Compression in Place as Prescribed: Yes Patient Has Alerts: Yes Pain Present Now: Yes Patient Alerts: Patient on Blood Thinner Aspirin 5m AVVS ABI/TBI 7/22 ABI L- 1.15/ R- 1.08 Electronic Signature(s) Signed: 10/24/2020 3:42:16 PM By: BDonnamarie PoagEntered By: BDonnamarie [name] 10/24/2020 09:31:58 [name], [name] ([id number] -------------------------------------------------------------------------------- Clinic Level of Care Assessment Details Patient Name: Janet [name] Date of Service: 10/24/2020 9:30 AM Medical Record Number: [id number] Account Number: [id number] of Birth/Sex: 2August 31, 1952(70y.o. F) Treating RN: BDonnamarie PoagPrimary Care Meiya Wisler: MDelight StareOther Clinician: Referring Avier [name]: MDelight StareTreating Janet [name]/Extender: RTito Dinein Treatment: 8 Clinic Level of Care Assessment Items TOOL 1 Quantity Score '[]'  - Use when EandM and Procedure is performed on INITIAL visit 0 ASSESSMENTS - Nursing Assessment / Reassessment '[]'  - General Physical Exam (combine w/ comprehensive  assessment (listed just below) when performed on new 0 pt. evals) '[]'  - 0 Comprehensive Assessment (HX, ROS, Risk Assessments, Wounds Hx, etc.) ASSESSMENTS - Wound and Skin Assessment / Reassessment '[]'  - Dermatologic / Skin Assessment (not related to wound area) 0 ASSESSMENTS - Ostomy and/or Continence Assessment and Care '[]'  - Incontinence Assessment and Management 0 '[]'  - 0 Ostomy Care Assessment and Management (repouching, etc.) PROCESS - Coordination of Care '[]'  - Simple Patient / Family Education for ongoing care 0 '[]'  - 0 Complex (extensive) Patient / Family Education for ongoing care '[]'  - 0 Staff obtains CProgrammer, systems Records, Test Results / Process Orders '[]'  - 0 Staff telephones HHA, Nursing Homes / Clarify orders / etc '[]'  - 0 Routine Transfer to another Facility (non-emergent condition) '[]'  - 0 Routine Hospital Admission (non-emergent condition) '[]'  - 0 New Admissions / IBiomedical engineer/ Ordering NPWT, Apligraf, etc. '[]'  - 0 Emergency Hospital Admission (emergent condition) PROCESS - Special Needs '[]'  - Pediatric / Minor Patient Management 0 '[]'  - 0 Isolation Patient Management '[]'  - 0 Hearing / Language / Visual special needs '[]'  - 0 Assessment of Community assistance (transportation, D/C planning, etc.) '[]'  - 0 Additional assistance / Altered mentation '[]'  - 0 Support Surface(s) Assessment (bed, cushion, seat, etc.) INTERVENTIONS - Miscellaneous '[]'  - External ear exam 0 '[]'  - 0 Patient Transfer (multiple staff / HCivil Service fast streamer/ Similar devices) '[]'  - 0 Simple Staple / Suture removal (25 or less) '[]'  - 0 Complex Staple / Suture removal (26 or more) '[]'  - 0 Hypo/Hyperglycemic Management (do not check if billed separately) '[]'  - 0 Ankle / Brachial Index (ABI) - do not check if billed separately Has the patient been seen at the hospital within the last three years: Yes Total Score: 0 Level Of Care: ____ Janet [name]([id number] Electronic Signature(s) Signed:  10/24/2020 3:42:16 PM By: BDonnamarie PoagEntered By: BDonnamarie [name] 10/24/2020 10:05:04 [name],  [name] ([id number]) -------------------------------------------------------------------------------- Compression Therapy Details Patient Name: [name], [name]. Date of Service: 10/24/2020 9:30 AM Medical Record Number: [id number] Patient Account Number: [id number] Date of Birth/Sex: 1950/08/17 (70 y.o. F) Treating RN: Janet [name] Primary Care Janet [name] Other Clinician: Referring Ermelinda Eckert: Janet [name] Treating Janet [name] in Treatment: 8 Compression Therapy Performed for Wound Assessment: Wound #1 Left,Distal,Anterior Lower Leg Performed By: Janet Argyle, RN Compression Type: Three Layer Post Procedure Diagnosis Same as Pre-procedure Electronic Signature(s) Signed: 10/24/2020 3:42:16 PM By: Janet [name] Entered By: Janet [name] on 10/24/2020 09:49:49 [name], [name] ([id number]) -------------------------------------------------------------------------------- Encounter Discharge Information Details Patient Name: Janet [name]. Date of Service: 10/24/2020 9:30 AM Medical Record Number: [id number] Patient Account Number: [id number] Date of Birth/Sex: May 01, 1950 (70 y.o. F) Treating RN: Janet [name] Primary Care Janet [name]: Janet [name] Other Clinician: Referring Janet [name]: Janet [name] Treating Janet [name] in Treatment: 8 Encounter Discharge Information Items Discharge Condition: Stable Ambulatory Status: Ambulatory Discharge Destination: Home Transportation: Private Auto Accompanied By: self Schedule Follow-up Appointment: Yes Clinical Summary of Care: Electronic Signature(s) Signed: 10/24/2020 3:42:16 PM By: Janet [name] Entered By: Janet [name] on 10/24/2020 10:07:07 [name], [name] ([id number]) -------------------------------------------------------------------------------- Lower Extremity Assessment  Details Patient Name: Janet [name]. Date of Service: 10/24/2020 9:30 AM Medical Record Number: [id number] Patient Account Number: [id number] Date of Birth/Sex: [birth year] (70 y.o. F) Treating RN: Janet [name] Primary Care Renan Danese: Janet [name] Other Clinician: Referring Adeline Petitfrere: Janet [name] Treating Briannia Laba/Extender: Janet [name] in Treatment: 8 Edema Assessment Assessed: [Left: Yes] [Right: No] [Left: Edema] [Right: :] Calf Left: Right: Point of Measurement: 29 cm From Medial Instep 38 cm Ankle Left: Right: Point of Measurement: 10 cm From Medial Instep 24 cm Knee To Floor Left: Right: From Medial Instep 41 cm Vascular Assessment Pulses: Dorsalis Pedis Palpable: [Left:Yes] Electronic Signature(s) Signed: 10/24/2020 3:42:16 PM By: Janet [name] Entered By: Janet [name] on 10/24/2020 09:42:48 Pearman, Betzaida M. ([id number]) -------------------------------------------------------------------------------- Multi Wound Chart Details Patient Name: Janet [name]. Date of Service: 10/24/2020 9:30 AM Medical Record Number: [id number] Patient Account Number: [id number] Date of Birth/Sex: 09-Dec-1950 (70 y.o. F) Treating RN: Janet [name] Primary Care Malena Timpone: Janet [name] Other Clinician: Referring Emrys Mckamie: Janet [name] Treating Ryah Cribb/Extender: Janet [name] in Treatment: 8 Vital Signs Height(in): 57 Pulse(bpm): 56 Weight(lbs): 192 Blood Pressure(mmHg): 175/78 Body Mass Index(BMI): 31 Temperature(F): 98.2 Respiratory Rate(breaths/min): 16 Photos: [N/A:N/A] Wound Location: Left, Distal, Anterior Lower Leg Left, Proximal, Anterior Lower Leg N/A Wounding Event: Blister Blister N/A Primary Etiology: Venous Leg Ulcer Venous Leg Ulcer N/A Secondary Etiology: Lymphedema Lymphedema N/A Comorbid History: Asthma, Hypertension Asthma, Hypertension N/A Date Acquired: 05/15/2020 05/15/2020 N/A Weeks of Treatment: 8 8 N/A Wound Status: Open Open N/A Measurements L x W x  D (cm) 4x2.1x0.1 1.4x1x0.1 N/A Area (cm) : 6.597 1.1 N/A Volume (cm) : 0.66 0.11 N/A % Reduction in Area: -10.50% 6.60% N/A % Reduction in Volume: -10.60% 6.80% N/A Classification: Full Thickness Without Exposed Full Thickness Without Exposed N/A Support Structures Support Structures Exudate Amount: Medium Medium N/A Exudate Type: Serosanguineous Serosanguineous N/A Exudate Color: red, brown red, brown N/A Wound Margin: N/A Flat and Intact N/A Granulation Amount: Large (67-100%) Large (67-100%) N/A Granulation Quality: Red, Hyper-granulation Red, Hyper-granulation N/A Necrotic Amount: Small (1-33%) Small (1-33%) N/A Exposed Structures: Fat Layer (Subcutaneous Tissue): Fat Layer (Subcutaneous Tissue): N/A Yes Yes Fascia: No Fascia: No Tendon: No Tendon: No Muscle: No Muscle: No Joint: No Joint: No Bone: No Bone: No Epithelialization: None None  N/A Procedures Performed: Compression Therapy N/A N/A Treatment Notes Electronic Signature(s) Signed: 10/24/2020 5:14:01 PM By: Linton Ham MD Entered By: Linton Ham on 10/24/2020 09:55:27 Hartwell, Leiliana M. ([id number]) -------------------------------------------------------------------------------- Multi-Disciplinary Care Plan Details Patient Name: Janet [name]. Date of Service: 10/24/2020 9:30 AM Medical Record Number: [id number] Patient Account Number: [id number] Date of Birth/Sex: Apr 26, 1950 (70 y.o. F) Treating RN: Janet [name] Primary Care Khylan Sawyer: Janet [name] Other Clinician: Referring Payten Beaumier: Janet [name] Treating Bradi Arbuthnot/Extender: Janet [name] in Treatment: 8 Active Inactive Wound/Skin Impairment Nursing Diagnoses: Impaired tissue integrity Knowledge deficit related to smoking impact on wound healing Knowledge deficit related to ulceration/compromised skin integrity Goals: Patient/caregiver will verbalize understanding of skin care regimen Date Initiated: 08/29/2020 Date Inactivated:  09/20/2020 Target Resolution Date: 09/14/2020 Goal Status: Met Ulcer/skin breakdown will have a volume reduction of 30% by week 4 Date Initiated: 08/29/2020 Date Inactivated: 10/17/2020 Target Resolution Date: 09/29/2020 Goal Status: Met Ulcer/skin breakdown will have a volume reduction of 50% by week 8 Date Initiated: 08/29/2020 Target Resolution Date: 10/29/2020 Goal Status: Active Ulcer/skin breakdown will have a volume reduction of 80% by week 12 Date Initiated: 08/29/2020 Target Resolution Date: 11/29/2020 Goal Status: Active Ulcer/skin breakdown will heal within 14 weeks Date Initiated: 08/29/2020 Target Resolution Date: 12/14/2020 Goal Status: Active Interventions: Assess patient/caregiver ability to obtain necessary supplies Assess patient/caregiver ability to perform ulcer/skin care regimen upon admission and as needed Assess ulceration(s) every visit Provide education on ulcer and skin care Notes: Electronic Signature(s) Signed: 10/24/2020 3:42:16 PM By: Janet [name] Entered By: Janet [name] on 10/24/2020 09:49:25 Lamica, Kellene M. ([id number]) -------------------------------------------------------------------------------- Pain Assessment Details Patient Name: Janet [name]. Date of Service: 10/24/2020 9:30 AM Medical Record Number: [id number] Patient Account Number: [id number] Date of Birth/Sex: 1950/12/10 (70 y.o. F) Treating RN: Janet [name] Primary Care Louise Rawson: Janet [name] Other Clinician: Referring Dezyrae Kensinger: Janet [name] Treating Saki Legore/Extender: Janet [name] in Treatment: 8 Active Problems Location of Pain Severity and Description of Pain Patient Has Paino Yes Site Locations Rate the pain. Current Pain Level: 7 Pain Management and Medication Current Pain Management: Electronic Signature(s) Signed: 10/24/2020 3:42:16 PM By: Janet [name] Entered By: Janet [name] on 10/24/2020 09:33:24 Scoggin, Domonique M.  ([id number]) -------------------------------------------------------------------------------- Patient/Caregiver Education Details Patient Name: Janet [name]. Date of Service: 10/24/2020 9:30 AM Medical Record Number: [id number] Patient Account Number: [id number] Date of Birth/Gender: 1950-03-06 (70 y.o. F) Treating RN: Janet [name] Primary Care Physician: Janet [name] Other Clinician: Referring Physician: Delight [name] Treating Physician/Extender: Janet [name] in Treatment: 8 Education Assessment Education Provided To: Patient Education Topics Provided Wound/Skin Impairment: Electronic Signature(s) Signed: 10/24/2020 3:42:16 PM By: Janet [name] Entered By: Janet [name] on 10/24/2020 10:05:32 [place], Earnestene M. ([id number]) -------------------------------------------------------------------------------- Wound Assessment Details Patient Name: Janet [name]. Date of Service: 10/24/2020 9:30 AM Medical Record Number: [id number] Patient Account Number: [id number] Date of Birth/Sex: May 04, 1950 (70 y.o. F) Treating RN: Janet [name] Primary Care Ailie Gage: Janet [name] Other Clinician: Referring Olla Delancey: Janet [name] Treating Gordon Carlson/Extender: Janet [name] in Treatment: 8 Wound Status Wound Number: 1 Primary Etiology: Venous Leg Ulcer Wound Location: Left, Distal, Anterior Lower Leg Secondary Etiology: Lymphedema Wounding Event: Blister Wound Status: Open Date Acquired: 05/15/2020 Comorbid History: Asthma, Hypertension Weeks Of Treatment: 8 Clustered Wound: No Photos Wound Measurements Length: (cm) 4 Width: (cm) 2.1 Depth: (cm) 0.1 Area: (cm) 6.597 Volume: (cm) 0.66 % Reduction in Area: -10.5% % Reduction in Volume: -10.6% Epithelialization: None Tunneling: No Undermining: No Wound Description Classification: Full Thickness Without Exposed Support Structu Exudate  Amount: Medium Exudate Type: Serosanguineous Exudate Color: red, brown res Foul Odor After  Cleansing: No Slough/Fibrino Yes Wound Bed Granulation Amount: Large (67-100%) Exposed Structure Granulation Quality: Red, Hyper-granulation Fascia Exposed: No Necrotic Amount: Small (1-33%) Fat Layer (Subcutaneous Tissue) Exposed: Yes Necrotic Quality: Adherent Slough Tendon Exposed: No Muscle Exposed: No Joint Exposed: No Bone Exposed: No Treatment Notes Wound #1 (Lower Leg) Wound Laterality: Left, Anterior, Distal Cleanser Peri-Wound Care Triamcinolone Acetonide Cream, 0.1%, 15 (g) tube Topical Dinovo, Yaritsa M. ([id number]) Primary Dressing Hydrofera Blue Ready Transfer Foam, 2.5x2.5 (in/in) Discharge Instruction: Apply Hydrofera Blue Ready to wound bed as directed Secondary Dressing Secured With Compression Wrap Profore Lite LF 3 Multilayer Compression Bandaging System Discharge Instruction: Apply 3 multi-layer wrap as prescribed. Compression Stockings Add-Ons Electronic Signature(s) Signed: 10/24/2020 3:42:16 PM By: Janet [name] Entered By: Janet [name] on 10/24/2020 09:38:50 Reffner, Shemicka M. ([id number]) -------------------------------------------------------------------------------- Wound Assessment Details Patient Name: Janet [name]. Date of Service: 10/24/2020 9:30 AM Medical Record Number: [id number] Patient Account Number: [id number] Date of Birth/Sex: November 18, 1950 (70 y.o. F) Treating RN: Janet [name] Primary Care Tonya Wantz: Janet [name] Other Clinician: Referring Camari Quintanilla: Janet [name] Treating Meredith Kilbride/Extender: Janet [name] in Treatment: 8 Wound Status Wound Number: 2 Primary Etiology: Venous Leg Ulcer Wound Location: Left, Proximal, Anterior Lower Leg Secondary Etiology: Lymphedema Wounding Event: Blister Wound Status: Open Date Acquired: 05/15/2020 Comorbid History: Asthma, Hypertension Weeks Of Treatment: 8 Clustered Wound: No Photos Wound Measurements Length: (cm) 1.4 Width: (cm) 1 Depth: (cm) 0.1 Area: (cm) 1.1 Volume: (cm) 0.11 %  Reduction in Area: 6.6% % Reduction in Volume: 6.8% Epithelialization: None Tunneling: No Undermining: No Wound Description Classification: Full Thickness Without Exposed Support Structu Wound Margin: Flat and Intact Exudate Amount: Medium Exudate Type: Serosanguineous Exudate Color: red, brown res Foul Odor After Cleansing: No Slough/Fibrino Yes Wound Bed Granulation Amount: Large (67-100%) Exposed Structure Granulation Quality: Red, Hyper-granulation Fascia Exposed: No Necrotic Amount: Small (1-33%) Fat Layer (Subcutaneous Tissue) Exposed: Yes Necrotic Quality: Adherent Slough Tendon Exposed: No Muscle Exposed: No Joint Exposed: No Bone Exposed: No Treatment Notes Wound #2 (Lower Leg) Wound Laterality: Left, Anterior, Proximal Cleanser Peri-Wound Care Topical Triamcinolone Acetonide Cream, 0.1%, 15 (g) tube Hosier, Dawt M. ([id number]) Discharge Instruction: Apply as directed by Herny Scurlock. Primary Dressing Hydrofera Blue Ready Transfer Foam, 2.5x2.5 (in/in) Discharge Instruction: Apply Hydrofera Blue Ready to wound bed as directed Secondary Dressing Secured With Compression Wrap Profore Lite LF 3 Multilayer Compression Bandaging System Discharge Instruction: Apply 3 multi-layer wrap as prescribed. Compression Stockings Add-Ons Electronic Signature(s) Signed: 10/24/2020 3:42:16 PM By: Janet [name] Entered By: Janet [name] on 10/24/2020 09:40:12 Glades, Shanetha M. ([id number]) -------------------------------------------------------------------------------- Vitals Details Patient Name: Janet [name]. Date of Service: 10/24/2020 9:30 AM Medical Record Number: [id number] Patient Account Number: [id number] Date of Birth/Sex: [birth year] (69 y.o. F) Treating RN: Janet [name] Primary Care Destine Zirkle: Janet [name] Other Clinician: Referring Getsemani Lindon: Janet [name] Treating Jeramyah Goodpasture/Extender: Janet [name] in Treatment: 8 Vital Signs Time Taken: 09:31 Temperature (F):  98.2 Height (in): 66 Pulse (bpm): 79 Weight (lbs): 192 Respiratory Rate (breaths/min): 16 Body Mass Index (BMI): 31 Blood Pressure (mmHg): 175/78 Reference Range: 80 - 120 mg / dl Electronic Signature(s) Signed: 10/24/2020 3:42:16 PM By: Janet [name] Entered ByDonnamarie [name] on 10/24/2020 09:33:08

## 2020-10-31 ENCOUNTER — Other Ambulatory Visit: Payer: Self-pay

## 2020-10-31 ENCOUNTER — Encounter: Payer: Medicare Other | Admitting: Physician Assistant

## 2020-10-31 DIAGNOSIS — L97822 Non-pressure chronic ulcer of other part of left lower leg with fat layer exposed: Secondary | ICD-10-CM | POA: Diagnosis not present

## 2020-10-31 NOTE — Progress Notes (Addendum)
RAYLEY, GAO ([id number]) Visit Report for 10/31/2020 Chief Complaint Document Details Patient Name: Janet [name], Janet [name]. Date of Service: 10/31/2020 8:30 AM Medical Record Number: [id number] Patient Account Number: [id number] Date of Birth/Sex: 1950-09-27 (70 y.o. F) Treating RN: Cornell Barman Primary Care Provider: Delight Stare Other Clinician: Referring Provider: Delight Stare Treating Provider/Extender: Skipper Cliche in Treatment: 9 Information Obtained from: Patient Chief Complaint Left LE Ulcers Electronic Signature(s) Signed: 10/31/2020 8:32:23 AM By: Worthy Keeler PA-C Entered By: Worthy Keeler on 10/31/2020 08:32:23 Janet [name], Janet [name] Kitchen ([id number]) -------------------------------------------------------------------------------- HPI Details Patient Name: Janet [name]. Date of Service: 10/31/2020 8:30 AM Medical Record Number: [id number] Patient Account Number: [id number] Date of Birth/Sex: [birth year] (70 y.o. F) Treating RN: Cornell Barman Primary Care Provider: Delight Stare Other Clinician: Referring Provider: Delight Stare Treating Provider/Extender: Skipper Cliche in Treatment: 9 History of Present Illness HPI Description: 08/29/2020 upon evaluation today patient appears to be doing somewhat poorly in regard to what appeared to be venous leg ulcers on the left leg that she has been dealing with since around the beginning of May. Fortunately she does not have any signs of obvious infection unfortunately she does have quite a bit of necrotic tissue that does not really need to be debrided. She has been seeing vascular and they did wrap her for a time with what was likely an Haematologist. With that being said the [facility] boot has given her some trouble as far as feeling claustrophobic was concerned she is not sure if she is good to be able to tolerate a compression wrap currently although we will get a try she is in agreement with Aleve given this shot. Fortunately I do not see any signs of  active infection systemically which is great news and even locally she seems to be doing quite well today. Patient's Right ABI is 1.08 with a TBI of 0.79 and the left ABI is 1.15 with a TBI of 0.84 on 08/03/20. She also has a history of chronic venous insufficiency and lymphedema but otherwise no major medical problems. 09/05/2020 upon evaluation today patient unfortunately appears to be having some issues today with her right leg this is completely different from what we have been taking care of on the left. She actually has some swelling and an area of erythema and pain which is in the medial calf region. There is no identifiable abscess definitely this could just be cellulitis but with her history of DVT I think that we need to have this checked out. 09/12/2020 upon evaluation today patient appears to be doing better in regard to her wound. She has been tolerating the dressing changes without complication. Fortunately there does not appear to be any signs of active infection which is great news and overall I think that the patient is doing well in this regard. Her wounds are measuring smaller at both locations and the surface of the wound is looking better. With that being said she does have a wedding that she is supposed to be going to this weekend and states that she really needs to not have the wrap on for that. Nonetheless regular and discuss options to try to help maintain while she has this time with the wedding. 09/20/2020 upon evaluation today patient unfortunately is doing worse in regard to the pain associated with her wound at this time. Fortunately there does not appear to be any evidence of active infection systemically which is great news but in general I feel like locally she may  definitely be infected. Subsequently I do believe that we need to address this as soon as possible. 09/26/2020 upon evaluation today patient appears to be doing well with regard to her wound. Fortunately there  does not appear to be any signs of worsening in general. I think that overall the patient is making wonderful progress in my opinion. The right leg where she had an area of erythema medially also is better today which is great news. I think Levaquin has done a great job in that regard. We did see evidence still some erythema on the right leg but again this is significantly improved compared to last week. Overall the Levaquin has done a great job in this regard. 10/03/2020 upon evaluation today patient appears to be doing well all things considered with regard to her wounds. There does not appear to be any signs of infection obvious at this point which is great news. Fortunately I think that she is definitely making progress with regard to the appearance of the wound bed but nonetheless is still having quite a bit of an issue here with the pain aspect. Fortunately I think that we have some other options to consider him and use triamcinolone underneath the [facility] today. Also think looking into PuraPly/Apligraf as a treatment could be appropriate for this patient and my hope is that we will be able to get the wounds growing in a much faster pace as far as new tissue is concerned to get this closed sooner rather than later as that is the only way that we will get a get the pain completely resolved for her which is ultimately the goal we have as well obviously. 9/26; 2 wounds on the left anterior lower leg in the setting of what appears to be severe chronic venous insufficiency. We have been using Hydrofera Blue and 3 layer compression. 10/17/2020 upon evaluation today patient appears to be doing well with regard to her leg ulcer. She has been tolerating the dressing changes without complication. Fortunately there does not appear to be any signs of active infection at this time. No fevers, chills, nausea, vomiting, or diarrhea. 10/10; 2 wounds on the left lateral calf. She has been using Hydrofera  Blue. Still complains of pain. She is on her second week of that was started last week. 10/31/2020 upon evaluation today patient's wound bed actually showed signs of good granulation and epithelization at this point. Fortunately there does not appear to be any signs of active infection which is great news and overall very pleased with where we stand. I do think that the patient is making excellent progress based on what we are seeing I believe the triamcinolone along with a Hydrofera Blue is doing a great job. Electronic Signature(s) Signed: 10/31/2020 9:15:43 AM By: Worthy Keeler PA-C Entered By: Worthy Keeler on 10/31/2020 09:15:42 Janet [name], Janet [name]. ([id number]) -------------------------------------------------------------------------------- Physical Exam Details Patient Name: Janet [name]. Date of Service: 10/31/2020 8:30 AM Medical Record Number: [id number] Patient Account Number: [id number] Date of Birth/Sex: [birth year] (70 y.o. F) Treating RN: Cornell Barman Primary Care Provider: Delight Stare Other Clinician: Referring Provider: Delight Stare Treating Provider/Extender: Skipper Cliche in Treatment: 9 Constitutional Well-nourished and well-hydrated in no acute distress. Respiratory normal breathing without difficulty. Psychiatric this patient is able to make decisions and demonstrates good insight into disease process. Alert and Oriented x 3. pleasant and cooperative. Notes Upon inspection patient's wound bed showed signs of good granulation epithelization at this point there is minimal slough noted  I carefully cleaned this away with saline and gauze no sharp debridement performed today. Electronic Signature(s) Signed: 10/31/2020 9:16:13 AM By: Worthy Keeler PA-C Entered By: Worthy Keeler on 10/31/2020 09:16:12 Janet [name], Janet [name] Kitchen ([id number]) -------------------------------------------------------------------------------- Physician Orders Details Patient Name: Janet [name]. Date of Service: 10/31/2020 8:30 AM Medical Record Number: [id number] Patient Account Number: [id number] Date of Birth/Sex: 02/17/1950 (70 y.o. F) Treating RN: Cornell Barman Primary Care Provider: Delight Stare Other Clinician: Referring Provider: Delight Stare Treating Provider/Extender: Skipper Cliche in Treatment: 9 Verbal / Phone Orders: No Diagnosis Coding ICD-10 Coding Code Description I87.2 Venous insufficiency (chronic) (peripheral) I89.0 Lymphedema, not elsewhere classified 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 of other part of left lower leg with fat layer exposed Follow-up Appointments o Return Appointment in 1 week. o Nurse Visit as needed Bathing/ Shower/ Hygiene o May shower with wound dressing protected with water repellent cover or cast protector. o No tub bath. Edema Control - Lymphedema / Segmental Compressive Device / Other o Optional: One layer of unna paste to top of compression wrap (to act as an anchor). - left leg o Patient to wear own compression stockings. Remove compression stockings every night before going to bed and put on every morning when getting up. - right leg-see Elastic Therapy contact and measurements o Patient to wear own Velcro compression garment. Remove compression stockings every night before going to bed and put on every morning when getting up. - will be mailed to you, "JUXTELITE" o Elevate legs to the level of the heart and pump ankles as often as possible o Elevate leg(s) parallel to the floor when sitting. o DO YOUR BEST to sleep in the bed at night. DO NOT sleep in your recliner. Long hours of sitting in a recliner leads to swelling of the legs and/or potential wounds on your backside. Medications-Please add to medication list. o Take one 500mg  Tylenol (Acetaminophen) and one 200mg  Motrin (Ibuprofen) every 6 hours for pain. Do not take ibuprofen if you are on blood thinners or have stomach ulcers. - as needed for  pain if not contraindicated by other physician Wound Treatment Wound #1 - Lower Leg Wound Laterality: Left, Anterior, Distal Topical: Triamcinolone Acetonide Cream, 0.1%, 15 (g) tube 1 x Per Week/30 Days Discharge Instructions: Apply as directed by provider. Primary Dressing: Hydrofera Blue Ready Transfer Foam, 2.5x2.5 (in/in) 1 x Per Week/30 Days Discharge Instructions: Apply Hydrofera Blue Ready to wound bed as directed Compression Wrap: Profore Lite LF 3 Multilayer Compression Bandaging System 1 x Per Week/30 Days Discharge Instructions: Apply 3 multi-layer wrap as prescribed. Wound #2 - Lower Leg Wound Laterality: Left, Anterior, Proximal Topical: Triamcinolone Acetonide Cream, 0.1%, 15 (g) tube 1 x Per Week/30 Days Discharge Instructions: Apply as directed by provider. Primary Dressing: Hydrofera Blue Ready Transfer Foam, 2.5x2.5 (in/in) 1 x Per Week/30 Days Discharge Instructions: Apply Hydrofera Blue Ready to wound bed as directed Compression Wrap: Profore Lite LF 3 Multilayer Compression Bandaging System 1 x Per Week/30 Days Discharge Instructions: Apply 3 multi-layer wrap as prescribed. Electronic Signature(s) Signed: 10/31/2020 5:23:05 PM By: Fredrich Birks, Semiah [name]. ([id number]) Entered By: Worthy Keeler on 10/31/2020 08:53:56 Janet [name], Janet [name]. ([id number]) -------------------------------------------------------------------------------- Problem List Details Patient Name: Janet [name]. Date of Service: 10/31/2020 8:30 AM Medical Record Number: [id number] Patient Account Number: [id number] Date of Birth/Sex: 02/27/1950 (70 y.o. F) Treating RN: Cornell Barman Primary Care Provider: Delight Stare Other Clinician: Referring Provider: Delight Stare Treating Provider/Extender: Skipper Cliche in Treatment:  9 Active Problems ICD-10 Encounter Code Description Active Date MDM Diagnosis I87.2 Venous insufficiency (chronic) (peripheral) 08/29/2020 No Yes I89.0 Lymphedema, not  elsewhere classified 08/29/2020 No Yes 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 of other part of left lower leg with fat layer 08/29/2020 No Yes exposed Inactive Problems ICD-10 Code Description Active Date Inactive Date L03.115 Cellulitis of right lower limb 09/05/2020 09/05/2020 R22.41 Localized swelling, mass and lump, right lower limb 09/05/2020 09/05/2020 Resolved Problems Electronic Signature(s) Signed: 10/31/2020 8:32:13 AM By: Worthy Keeler PA-C Entered By: Worthy Keeler on 10/31/2020 08:32:12 Janet [name], Janet [name]. ([id number]) -------------------------------------------------------------------------------- Progress Note Details Patient Name: Janet [name]. Date of Service: 10/31/2020 8:30 AM Medical Record Number: [id number] Patient Account Number: [id number] Date of Birth/Sex: 06-02-1950 (70 y.o. F) Treating RN: Cornell Barman Primary Care Provider: Delight Stare Other Clinician: Referring Provider: Delight Stare Treating Provider/Extender: Skipper Cliche in Treatment: 9 Subjective Chief Complaint Information obtained from Patient Left LE Ulcers History of Present Illness (HPI) 08/29/2020 upon evaluation today patient appears to be doing somewhat poorly in regard to what appeared to be venous leg ulcers on the left leg that she has been dealing with since around the beginning of May. Fortunately she does not have any signs of obvious infection unfortunately she does have quite a bit of necrotic tissue that does not really need to be debrided. She has been seeing vascular and they did wrap her for a time with what was likely an Haematologist. With that being said the [facility] boot has given her some trouble as far as feeling claustrophobic was concerned she is not sure if she is good to be able to tolerate a compression wrap currently although we will get a try she is in agreement with Aleve given this shot. Fortunately I do not see any signs of active infection systemically which is great news and even  locally she seems to be doing quite well today. Patient's Right ABI is 1.08 with a TBI of 0.79 and the left ABI is 1.15 with a TBI of 0.84 on 08/03/20. She also has a history of chronic venous insufficiency and lymphedema but otherwise no major medical problems. 09/05/2020 upon evaluation today patient unfortunately appears to be having some issues today with her right leg this is completely different from what we have been taking care of on the left. She actually has some swelling and an area of erythema and pain which is in the medial calf region. There is no identifiable abscess definitely this could just be cellulitis but with her history of DVT I think that we need to have this checked out. 09/12/2020 upon evaluation today patient appears to be doing better in regard to her wound. She has been tolerating the dressing changes without complication. Fortunately there does not appear to be any signs of active infection which is great news and overall I think that the patient is doing well in this regard. Her wounds are measuring smaller at both locations and the surface of the wound is looking better. With that being said she does have a wedding that she is supposed to be going to this weekend and states that she really needs to not have the wrap on for that. Nonetheless regular and discuss options to try to help maintain while she has this time with the wedding. 09/20/2020 upon evaluation today patient unfortunately is doing worse in regard to the pain associated with her wound at this time. Fortunately there does not appear to be any evidence of  active infection systemically which is great news but in general I feel like locally she may definitely be infected. Subsequently I do believe that we need to address this as soon as possible. 09/26/2020 upon evaluation today patient appears to be doing well with regard to her wound. Fortunately there does not appear to be any signs of worsening in general. I  think that overall the patient is making wonderful progress in my opinion. The right leg where she had an area of erythema medially also is better today which is great news. I think Levaquin has done a great job in that regard. We did see evidence still some erythema on the right leg but again this is significantly improved compared to last week. Overall the Levaquin has done a great job in this regard. 10/03/2020 upon evaluation today patient appears to be doing well all things considered with regard to her wounds. There does not appear to be any signs of infection obvious at this point which is great news. Fortunately I think that she is definitely making progress with regard to the appearance of the wound bed but nonetheless is still having quite a bit of an issue here with the pain aspect. Fortunately I think that we have some other options to consider him and use triamcinolone underneath the [facility] today. Also think looking into PuraPly/Apligraf as a treatment could be appropriate for this patient and my hope is that we will be able to get the wounds growing in a much faster pace as far as new tissue is concerned to get this closed sooner rather than later as that is the only way that we will get a get the pain completely resolved for her which is ultimately the goal we have as well obviously. 9/26; 2 wounds on the left anterior lower leg in the setting of what appears to be severe chronic venous insufficiency. We have been using Hydrofera Blue and 3 layer compression. 10/17/2020 upon evaluation today patient appears to be doing well with regard to her leg ulcer. She has been tolerating the dressing changes without complication. Fortunately there does not appear to be any signs of active infection at this time. No fevers, chills, nausea, vomiting, or diarrhea. 10/10; 2 wounds on the left lateral calf. She has been using Hydrofera Blue. Still complains of pain. She is on her second week of  that was started last week. 10/31/2020 upon evaluation today patient's wound bed actually showed signs of good granulation and epithelization at this point. Fortunately there does not appear to be any signs of active infection which is great news and overall very pleased with where we stand. I do think that the patient is making excellent progress based on what we are seeing I believe the triamcinolone along with a Hydrofera Blue is doing a great job. Janet [name], Janet [name]. ([id number]) Objective Constitutional Well-nourished and well-hydrated in no acute distress. Vitals Time Taken: 8:27 AM, Height: 66 in, Weight: 192 lbs, BMI: 31, Temperature: 98.6 F, Pulse: 78 bpm, Respiratory Rate: 18 breaths/min, Blood Pressure: 158/72 mmHg. Respiratory normal breathing without difficulty. Psychiatric this patient is able to make decisions and demonstrates good insight into disease process. Alert and Oriented x 3. pleasant and cooperative. General Notes: Upon inspection patient's wound bed showed signs of good granulation epithelization at this point there is minimal slough noted I carefully cleaned this away with saline and gauze no sharp debridement performed today. Integumentary (Hair, Skin) Wound #1 status is Open. Original cause of wound was  Blister. The date acquired was: 05/15/2020. The wound has been in treatment 9 weeks. The wound is located on the [facility] Lower Leg. The wound measures 4.5cm length x 2cm width x 0.1cm depth; 7.069cm^2 area and 0.707cm^3 volume. There is Fat Layer (Subcutaneous Tissue) exposed. There is no tunneling or undermining noted. There is a medium amount of serosanguineous drainage noted. There is large (67-100%) red, hyper - granulation within the wound bed. There is a small (1-33%) amount of necrotic tissue within the wound bed including Adherent Slough. Wound #2 status is Open. Original cause of wound was Blister. The date acquired was: 05/15/2020. The wound has been  in treatment 9 weeks. The wound is located on the Left,Proximal,Anterior Lower Leg. The wound measures 1.1cm length x 0.9cm width x 0.1cm depth; 0.778cm^2 area and 0.078cm^3 volume. There is Fat Layer (Subcutaneous Tissue) exposed. There is no tunneling or undermining noted. There is a medium amount of serosanguineous drainage noted. The wound margin is flat and intact. There is large (67-100%) red, hyper - granulation within the wound bed. There is a small (1-33%) amount of necrotic tissue within the wound bed including Adherent Slough. Assessment Active Problems ICD-10 Venous insufficiency (chronic) (peripheral) Lymphedema, not elsewhere classified Non-pressure chronic ulcer of other part of left lower leg with fat layer exposed Procedures Wound #1 Pre-procedure diagnosis of Wound #1 is a Venous Leg Ulcer located on the Left,Distal,Anterior Lower Leg . There was a Three Layer Compression Therapy Procedure by Cornell Barman, RN. Post procedure Diagnosis Wound #1: Same as Pre-Procedure Wound #2 Pre-procedure diagnosis of Wound #2 is a Venous Leg Ulcer located on the Left,Proximal,Anterior Lower Leg . There was a Three Layer Compression Therapy Procedure by Cornell Barman, RN. Post procedure Diagnosis Wound #2: Same as Pre-Procedure Plan Follow-up Appointments: Return Appointment in 1 week. Nurse Visit as needed Bathing/ Shower/ Hygiene: Janet [name], Janet [name] ([id number]) May shower with wound dressing protected with water repellent cover or cast protector. No tub bath. Edema Control - Lymphedema / Segmental Compressive Device / Other: Optional: One layer of unna paste to top of compression wrap (to act as an anchor). - left leg Patient to wear own compression stockings. Remove compression stockings every night before going to bed and put on every morning when getting up. - right leg-see Elastic Therapy contact and measurements Patient to wear own Velcro compression garment. Remove compression  stockings every night before going to bed and put on every morning when getting up. - will be mailed to you, "JUXTELITE" Elevate legs to the level of the heart and pump ankles as often as possible Elevate leg(s) parallel to the floor when sitting. DO YOUR BEST to sleep in the bed at night. DO NOT sleep in your recliner. Long hours of sitting in a recliner leads to swelling of the legs and/or potential wounds on your backside. Medications-Please add to medication list.: Take one 500mg  Tylenol (Acetaminophen) and one 200mg  Motrin (Ibuprofen) every 6 hours for pain. Do not take ibuprofen if you are on blood thinners or have stomach ulcers. - as needed for pain if not contraindicated by other physician WOUND #1: - Lower Leg Wound Laterality: Left, Anterior, Distal Topical: Triamcinolone Acetonide Cream, 0.1%, 15 (g) tube 1 x Per Week/30 Days Discharge Instructions: Apply as directed by provider. Primary Dressing: Hydrofera Blue Ready Transfer Foam, 2.5x2.5 (in/in) 1 x Per Week/30 Days Discharge Instructions: Apply Hydrofera Blue Ready to wound bed as directed Compression Wrap: Profore Lite LF 3 Multilayer Compression Bandaging System 1 x  Per Week/30 Days Discharge Instructions: Apply 3 multi-layer wrap as prescribed. WOUND #2: - Lower Leg Wound Laterality: Left, Anterior, Proximal Topical: Triamcinolone Acetonide Cream, 0.1%, 15 (g) tube 1 x Per Week/30 Days Discharge Instructions: Apply as directed by provider. Primary Dressing: Hydrofera Blue Ready Transfer Foam, 2.5x2.5 (in/in) 1 x Per Week/30 Days Discharge Instructions: Apply Hydrofera Blue Ready to wound bed as directed Compression Wrap: Profore Lite LF 3 Multilayer Compression Bandaging System 1 x Per Week/30 Days Discharge Instructions: Apply 3 multi-layer wrap as prescribed. 1. Would recommend currently that we going continue with wound care measures as before and the patient is in agreement with plan this includes the use of the  triamcinolone followed by the [facility] which I think is doing a good job. 2. I am also can recommend that we continue with a 3 layer compression wrap. 3. I would also suggest patient continue to elevate her leg is much as possible to help with edema control. We will see patient back for reevaluation in 1 week here in the clinic. If anything worsens or changes patient will contact our office for additional recommendations. Electronic Signature(s) Signed: 10/31/2020 9:16:41 AM By: Worthy Keeler PA-C Entered By: Worthy Keeler on 10/31/2020 09:16:41 Janet [name], Janet [name]. ([id number]) -------------------------------------------------------------------------------- SuperBill Details Patient Name: Janet [name]. Date of Service: 10/31/2020 Medical Record Number: [id number] Patient Account Number: [id number] Date of Birth/Sex: 30-[name]-1950 (70 y.o. F) Treating RN: Cornell Barman Primary Care Provider: Delight Stare Other Clinician: Referring Provider: Delight Stare Treating Provider/Extender: Skipper Cliche in Treatment: 9 Diagnosis Coding ICD-10 Codes Code Description I87.2 Venous insufficiency (chronic) (peripheral) I89.0 Lymphedema, not elsewhere classified 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 of other part of left lower leg with fat layer exposed Facility Procedures CPT4 Code: 25638937 Description: (Facility Use Only) [phone] - [place] [facility] LT LEG Modifier: Quantity: 1 Physician Procedures CPT4 Code: 1157262 Description: 03559 - WC PHYS LEVEL 4 - EST PT Modifier: Quantity: 1 CPT4 Code: Description: ICD-10 Diagnosis Description I87.2 Venous insufficiency (chronic) (peripheral) I89.0 Lymphedema, not elsewhere classified 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 of other part of left lower leg with fat lay Modifier: er exposed Quantity: Electronic Signature(s) Signed: 10/31/2020 9:17:29 AM By: Worthy Keeler PA-C Entered By: Worthy Keeler on 10/31/2020 09:17:29

## 2020-11-04 NOTE — Progress Notes (Signed)
SANTINA, TRILLO ([id number]) Visit Report for 10/31/2020 Arrival Information Details Patient Name: Janet [name], Janet [name]. Date of Service: 10/31/2020 8:30 AM Medical Record Number: [id number] Patient Account Number: [id number] Date of Birth/Sex: [birth year] (70 y.o. F) Treating RN: Carlene Coria Primary Care Awad Gladd: Delight Stare Other Clinician: Referring Curstin Schmale: Delight Stare Treating Mariesha Venturella/Extender: Skipper Cliche in Treatment: 9 Visit Information History Since Last Visit All ordered tests and consults were completed: No Patient Arrived: Ambulatory Added or deleted any medications: No Arrival Time: 08:22 Any new allergies or adverse reactions: No Accompanied By: self Had a fall or experienced change in No Transfer Assistance: None activities of daily living that may affect Patient Identification Verified: Yes risk of falls: Secondary Verification Process Completed: Yes Signs or symptoms of abuse/neglect since last visito No Patient Requires Transmission-Based No Hospitalized since last visit: No Precautions: Implantable device outside of the clinic excluding No Patient Has Alerts: Yes cellular tissue based products placed in the center Patient Alerts: Patient on Blood since last visit: Thinner Has Dressing in Place as Prescribed: Yes Aspirin 35m Has Compression in Place as Prescribed: Yes AVVS ABI/TBI 7/22 ABI L- 1.15/ R- 1.08 Pain Present Now: No Electronic Signature(s) Signed: 11/04/2020 4:19:44 PM By: ECarlene CoriaRN Entered By: ECarlene Coriaon 10/31/2020 08:27:33 Erker, Anandi M. ([id number] -------------------------------------------------------------------------------- Clinic Level of Care Assessment Details Patient Name: [name] Date of Service: 10/31/2020 8:30 AM Medical Record Number: [id number] Account Number: [id number] of Birth/Sex: [birth year](70y.o. F) Treating RN: WCornell BarmanPrimary Care Sharrod Achille: MDelight StareOther  Clinician: Referring Jalyne Brodzinski: MDelight StareTreating Timothee Gali/Extender: SSkipper Clichein Treatment: 9 Clinic Level of Care Assessment Items TOOL 1 Quantity Score '[]'  - Use when EandM and Procedure is performed on INITIAL visit 0 ASSESSMENTS - Nursing Assessment / Reassessment '[]'  - General Physical Exam (combine w/ comprehensive assessment (listed just below) when performed on new 0 pt. evals) '[]'  - 0 Comprehensive Assessment (HX, ROS, Risk Assessments, Wounds Hx, etc.) ASSESSMENTS - Wound and Skin Assessment / Reassessment '[]'  - Dermatologic / Skin Assessment (not related to wound area) 0 ASSESSMENTS - Ostomy and/or Continence Assessment and Care '[]'  - Incontinence Assessment and Management 0 '[]'  - 0 Ostomy Care Assessment and Management (repouching, etc.) PROCESS - Coordination of Care '[]'  - Simple Patient / Family Education for ongoing care 0 '[]'  - 0 Complex (extensive) Patient / Family Education for ongoing care '[]'  - 0 Staff obtains CProgrammer, systems Records, Test Results / Process Orders '[]'  - 0 Staff telephones HHA, Nursing Homes / Clarify orders / etc '[]'  - 0 Routine Transfer to another Facility (non-emergent condition) '[]'  - 0 Routine Hospital Admission (non-emergent condition) '[]'  - 0 New Admissions / IBiomedical engineer/ Ordering NPWT, Apligraf, etc. '[]'  - 0 Emergency Hospital Admission (emergent condition) PROCESS - Special Needs '[]'  - Pediatric / Minor Patient Management 0 '[]'  - 0 Isolation Patient Management '[]'  - 0 Hearing / Language / Visual special needs '[]'  - 0 Assessment of Community assistance (transportation, D/C planning, etc.) '[]'  - 0 Additional assistance / Altered mentation '[]'  - 0 Support Surface(s) Assessment (bed, cushion, seat, etc.) INTERVENTIONS - Miscellaneous '[]'  - External ear exam 0 '[]'  - 0 Patient Transfer (multiple staff / HCivil Service fast streamer/ Similar devices) '[]'  - 0 Simple Staple / Suture removal (25 or less) '[]'  - 0 Complex Staple / Suture removal (26  or more) '[]'  - 0 Hypo/Hyperglycemic Management (do not check if billed separately) '[]'  - 0 Ankle / Brachial Index (ABI) - do  not check if billed separately Has the patient been seen at the hospital within the last three years: Yes Total Score: 0 Level Of Care: ____ Janet [name] ([id number]) Electronic Signature(s) Signed: 10/31/2020 5:23:05 PM By: Worthy Keeler PA-C Entered By: Worthy Keeler on 10/31/2020 08:54:02 Cease, Laikynn M. ([id number]) -------------------------------------------------------------------------------- Compression Therapy Details Patient Name: Janet [name]. Date of Service: 10/31/2020 8:30 AM Medical Record Number: [id number] Patient Account Number: [id number] Date of Birth/Sex: 10/24/1950 (70 y.o. F) Treating RN: Cornell Barman Primary Care Javae Braaten: Delight Stare Other Clinician: Referring Venetta Knee: Delight Stare Treating Cayleb Jarnigan/Extender: Skipper Cliche in Treatment: 9 Compression Therapy Performed for Wound Assessment: Wound #1 Left,Distal,Anterior Lower Leg Performed By: Clinician Cornell Barman, RN Compression Type: Three Layer Post Procedure Diagnosis Same as Pre-procedure Electronic Signature(s) Signed: 10/31/2020 5:23:05 PM By: Worthy Keeler PA-C Entered By: Worthy Keeler on 10/31/2020 08:52:49 Base, Madaline M. ([id number]) -------------------------------------------------------------------------------- Compression Therapy Details Patient Name: Janet [name]. Date of Service: 10/31/2020 8:30 AM Medical Record Number: [id number] Patient Account Number: [id number] Date of Birth/Sex: 18-Sep-1950 (70 y.o. F) Treating RN: Cornell Barman Primary Care Othello Sgroi: Delight Stare Other Clinician: Referring Breaunna Gottlieb: Delight Stare Treating Salif Tay/Extender: Skipper Cliche in Treatment: 9 Compression Therapy Performed for Wound Assessment: Wound #2 Left,Proximal,Anterior Lower Leg Performed By: Clinician Cornell Barman, RN Compression Type: Three Layer Post Procedure  Diagnosis Same as Pre-procedure Electronic Signature(s) Signed: 10/31/2020 5:23:05 PM By: Worthy Keeler PA-C Entered By: Worthy Keeler on 10/31/2020 08:52:49 Burgher, Clarabelle M. ([id number]) -------------------------------------------------------------------------------- Lower Extremity Assessment Details Patient Name: Janet [name]. Date of Service: 10/31/2020 8:30 AM Medical Record Number: [id number] Patient Account Number: [id number] Date of Birth/Sex: Jul 31, 1950 (70 y.o. F) Treating RN: Carlene Coria Primary Care Daune Divirgilio: Delight Stare Other Clinician: Referring Naksh Radi: Delight Stare Treating Afrah Burlison/Extender: Skipper Cliche in Treatment: 9 Edema Assessment Assessed: [Left: No] [Right: No] Edema: [Left: Ye] [Right: s] Calf Left: Right: Point of Measurement: 29 cm From Medial Instep 38 cm Ankle Left: Right: Point of Measurement: 10 cm From Medial Instep 24 cm Electronic Signature(s) Signed: 11/04/2020 4:19:44 PM By: Carlene Coria RN Entered By: Carlene Coria on 10/31/2020 08:38:03 Frier, Demetria M. ([id number]) -------------------------------------------------------------------------------- Multi Wound Chart Details Patient Name: Janet [name]. Date of Service: 10/31/2020 8:30 AM Medical Record Number: [id number] Patient Account Number: [id number] Date of Birth/Sex: August 14, 1950 (70 y.o. F) Treating RN: Cornell Barman Primary Care Cypher Paule: Delight Stare Other Clinician: Referring Esau Fridman: Delight Stare Treating Kahmari Herard/Extender: Skipper Cliche in Treatment: 9 Vital Signs Height(in): 66 Pulse(bpm): 71 Weight(lbs): 192 Blood Pressure(mmHg): 158/72 Body Mass Index(BMI): 31 Temperature(F): 98.6 Respiratory Rate(breaths/min): 18 Photos: [N/A:N/A] Wound Location: Left, Distal, Anterior Lower Leg Left, Proximal, Anterior Lower Leg N/A Wounding Event: Blister Blister N/A Primary Etiology: Venous Leg Ulcer Venous Leg Ulcer N/A Secondary Etiology: Lymphedema Lymphedema  N/A Comorbid History: Asthma, Hypertension Asthma, Hypertension N/A Date Acquired: 05/15/2020 05/15/2020 N/A Weeks of Treatment: 9 9 N/A Wound Status: Open Open N/A Measurements L x W x D (cm) 4.5x2x0.1 1.1x0.9x0.1 N/A Area (cm) : 7.069 0.778 N/A Volume (cm) : 0.707 0.078 N/A % Reduction in Area: -18.40% 34.00% N/A % Reduction in Volume: -18.40% 33.90% N/A Classification: Full Thickness Without Exposed Full Thickness Without Exposed N/A Support Structures Support Structures Exudate Amount: Medium Medium N/A Exudate Type: Serosanguineous Serosanguineous N/A Exudate Color: red, brown red, brown N/A Wound Margin: N/A Flat and Intact N/A Granulation Amount: Large (67-100%) Large (67-100%) N/A Granulation Quality: Red, Hyper-granulation Red, Hyper-granulation N/A Necrotic Amount: Small (1-33%) Small (1-33%) N/A  Exposed Structures: Fat Layer (Subcutaneous Tissue): Fat Layer (Subcutaneous Tissue): N/A Yes Yes Fascia: No Fascia: No Tendon: No Tendon: No Muscle: No Muscle: No Joint: No Joint: No Bone: No Bone: No Epithelialization: None None N/A Treatment Notes Electronic Signature(s) Signed: 10/31/2020 5:23:05 PM By: Worthy Keeler PA-C Entered By: Worthy Keeler on 10/31/2020 08:52:07 Knoop, Dajane M. ([id number]) -------------------------------------------------------------------------------- [place] Details Patient Name: Janet [name]. Date of Service: 10/31/2020 8:30 AM Medical Record Number: [id number] Patient Account Number: [id number] Date of Birth/Sex: 1950-09-29 (70 y.o. F) Treating RN: Cornell Barman Primary Care Oneita Allmon: Delight Stare Other Clinician: Referring Blasa Raisch: Delight Stare Treating Lidia Clavijo/Extender: Skipper Cliche in Treatment: 9 Active Inactive Wound/Skin Impairment Nursing Diagnoses: Impaired tissue integrity Knowledge deficit related to smoking impact on wound healing Knowledge deficit related to ulceration/compromised skin  integrity Goals: Patient/caregiver will verbalize understanding of skin care regimen Date Initiated: 08/29/2020 Date Inactivated: 09/20/2020 Target Resolution Date: 09/14/2020 Goal Status: Met Ulcer/skin breakdown will have a volume reduction of 30% by week 4 Date Initiated: 08/29/2020 Date Inactivated: 10/17/2020 Target Resolution Date: 09/29/2020 Goal Status: Met Ulcer/skin breakdown will have a volume reduction of 50% by week 8 Date Initiated: 08/29/2020 Target Resolution Date: 10/29/2020 Goal Status: Active Ulcer/skin breakdown will have a volume reduction of 80% by week 12 Date Initiated: 08/29/2020 Target Resolution Date: 11/29/2020 Goal Status: Active Ulcer/skin breakdown will heal within 14 weeks Date Initiated: 08/29/2020 Target Resolution Date: 12/14/2020 Goal Status: Active Interventions: Assess patient/caregiver ability to obtain necessary supplies Assess patient/caregiver ability to perform ulcer/skin care regimen upon admission and as needed Assess ulceration(s) every visit Provide education on ulcer and skin care Notes: Electronic Signature(s) Signed: 10/31/2020 5:13:51 PM By: Gretta Cool, BSN, RN, CWS, Kim RN, BSN Signed: 10/31/2020 5:23:05 PM By: Worthy Keeler PA-C Entered By: Worthy Keeler on 10/31/2020 08:51:54 Clink, Etherine M. ([id number]) -------------------------------------------------------------------------------- Pain Assessment Details Patient Name: Janet [name]. Date of Service: 10/31/2020 8:30 AM Medical Record Number: [id number] Patient Account Number: [id number] Date of Birth/Sex: 1950-11-25 (70 y.o. F) Treating RN: Carlene Coria Primary Care Raeya Merritts: Delight Stare Other Clinician: Referring Davie Sagona: Delight Stare Treating Shonteria Abeln/Extender: Skipper Cliche in Treatment: 9 Active Problems Location of Pain Severity and Description of Pain Patient Has Paino Yes Site Locations With Dressing Change: Yes Duration of the Pain. Constant / Intermittento  Constant Rate the pain. Current Pain Level: 8 Worst Pain Level: 10 Least Pain Level: 2 Tolerable Pain Level: 5 Character of Pain Describe the Pain: Aching, Burning Pain Management and Medication Current Pain Management: Medication: Yes Cold Application: No Rest: Yes Massage: No Activity: No T.E.N.S.: No Heat Application: No Leg drop or elevation: No Is the Current Pain Management Adequate: Inadequate How does your wound impact your activities of daily livingo Sleep: No Bathing: No Appetite: No Relationship With Others: No Bladder Continence: No Emotions: No Bowel Continence: No Work: No Toileting: No Drive: No Dressing: No Hobbies: No Electronic Signature(s) Signed: 11/04/2020 4:19:44 PM By: Carlene Coria RN Entered By: Carlene Coria on 10/31/2020 08:29:02 [place], Kirby. ([id number]) -------------------------------------------------------------------------------- Patient/Caregiver Education Details Patient Name: Janet [name]. Date of Service: 10/31/2020 8:30 AM Medical Record Number: [id number] Patient Account Number: [id number] Date of Birth/Gender: 06-Sep-1950 (70 y.o. F) Treating RN: Cornell Barman Primary Care Physician: Delight Stare Other Clinician: Referring Physician: Delight Stare Treating Physician/Extender: Skipper Cliche in Treatment: 9 Education Assessment Education Provided To: Patient Education Topics Provided Wound/Skin Impairment: Methods: Explain/Verbal Responses: State content correctly [employer]) Signed: 10/31/2020 5:23:05 PM By: Melburn Hake,  Hoyt PA-C Entered By: Worthy Keeler on 10/31/2020 08:54:21 Hottenstein, Sally-Ann M. ([id number]) -------------------------------------------------------------------------------- Wound Assessment Details Patient Name: JAYCEE, MCKELLIPS. Date of Service: 10/31/2020 8:30 AM Medical Record Number: [id number] Patient Account Number: [id number] Date of Birth/Sex: 1950/11/06 (70 y.o. F) Treating RN: Carlene Coria Primary Care Raahi Korber: Delight Stare Other Clinician: Referring Ninfa Giannelli: Delight Stare Treating Erryn Dickison/Extender: Skipper Cliche in Treatment: 9 Wound Status Wound Number: 1 Primary Etiology: Venous Leg Ulcer Wound Location: Left, Distal, Anterior Lower Leg Secondary Etiology: Lymphedema Wounding Event: Blister Wound Status: Open Date Acquired: 05/15/2020 Comorbid History: Asthma, Hypertension Weeks Of Treatment: 9 Clustered Wound: No Photos Wound Measurements Length: (cm) 4.5 Width: (cm) 2 Depth: (cm) 0.1 Area: (cm) 7.069 Volume: (cm) 0.707 % Reduction in Area: -18.4% % Reduction in Volume: -18.4% Epithelialization: None Tunneling: No Undermining: No Wound Description Classification: Full Thickness Without Exposed Support Structu Exudate Amount: Medium Exudate Type: Serosanguineous Exudate Color: red, brown res Foul Odor After Cleansing: No Slough/Fibrino Yes Wound Bed Granulation Amount: Large (67-100%) Exposed Structure Granulation Quality: Red, Hyper-granulation Fascia Exposed: No Necrotic Amount: Small (1-33%) Fat Layer (Subcutaneous Tissue) Exposed: Yes Necrotic Quality: Adherent Slough Tendon Exposed: No Muscle Exposed: No Joint Exposed: No Bone Exposed: No Electronic Signature(s) Signed: 11/04/2020 4:19:44 PM By: Carlene Coria RN Entered By: Carlene Coria on 10/31/2020 08:36:51 Lebeck, Analiyah M. ([id number]) -------------------------------------------------------------------------------- Wound Assessment Details Patient Name: Janet [name]. Date of Service: 10/31/2020 8:30 AM Medical Record Number: [id number] Patient Account Number: [id number] Date of Birth/Sex: February 09, 1950 (70 y.o. F) Treating RN: Carlene Coria Primary Care Baraka Klatt: Delight Stare Other Clinician: Referring Jackson Fetters: Delight Stare Treating Chalonda Schlatter/Extender: Skipper Cliche in Treatment: 9 Wound Status Wound Number: 2 Primary Etiology: Venous Leg Ulcer Wound Location: Left,  Proximal, Anterior Lower Leg Secondary Etiology: Lymphedema Wounding Event: Blister Wound Status: Open Date Acquired: 05/15/2020 Comorbid History: Asthma, Hypertension Weeks Of Treatment: 9 Clustered Wound: No Photos Wound Measurements Length: (cm) 1.1 Width: (cm) 0.9 Depth: (cm) 0.1 Area: (cm) 0.778 Volume: (cm) 0.078 % Reduction in Area: 34% % Reduction in Volume: 33.9% Epithelialization: None Tunneling: No Undermining: No Wound Description Classification: Full Thickness Without Exposed Support Structu Wound Margin: Flat and Intact Exudate Amount: Medium Exudate Type: Serosanguineous Exudate Color: red, brown res Foul Odor After Cleansing: No Slough/Fibrino Yes Wound Bed Granulation Amount: Large (67-100%) Exposed Structure Granulation Quality: Red, Hyper-granulation Fascia Exposed: No Necrotic Amount: Small (1-33%) Fat Layer (Subcutaneous Tissue) Exposed: Yes Necrotic Quality: Adherent Slough Tendon Exposed: No Muscle Exposed: No Joint Exposed: No Bone Exposed: No Electronic Signature(s) Signed: 11/04/2020 4:19:44 PM By: Carlene Coria RN Entered By: Carlene Coria on 10/31/2020 08:37:15 Cegielski, Tenia M. ([id number]) -------------------------------------------------------------------------------- Vitals Details Patient Name: Janet [name]. Date of Service: 10/31/2020 8:30 AM Medical Record Number: [id number] Patient Account Number: [id number] Date of Birth/Sex: Oct 09, 1950 (70 y.o. F) Treating RN: Carlene Coria Primary Care Lanson Randle: Delight Stare Other Clinician: Referring Deliliah Spranger: Delight Stare Treating Chanler Schreiter/Extender: Skipper Cliche in Treatment: 9 Vital Signs Time Taken: 08:27 Temperature (F): 98.6 Height (in): 66 Pulse (bpm): 78 Weight (lbs): 192 Respiratory Rate (breaths/min): 18 Body Mass Index (BMI): 31 Blood Pressure (mmHg): 158/72 Reference Range: 80 - 120 mg / dl Electronic Signature(s) Signed: 11/04/2020 4:19:44 PM By: Carlene Coria  RN Entered By: Carlene Coria on 10/31/2020 74:25:95

## 2020-11-07 ENCOUNTER — Other Ambulatory Visit: Payer: Self-pay

## 2020-11-07 ENCOUNTER — Encounter: Payer: Medicare Other | Admitting: Physician Assistant

## 2020-11-07 DIAGNOSIS — L97822 Non-pressure chronic ulcer of other part of left lower leg with fat layer exposed: Secondary | ICD-10-CM | POA: Diagnosis not present

## 2020-11-07 NOTE — Progress Notes (Signed)
TANITA, PALINKAS ([id number]) Visit Report for 11/07/2020 Arrival Information Details Patient Name: Janet [name], Janet [name]. Date of Service: 11/07/2020 8:30 AM Medical Record Number: [id number] Patient Account Number: [id number] Date of Birth/Sex: 1950-10-01 (70 y.o. F) Treating RN: Janet [name] Primary Care [place]: Janet [name] Other Clinician: Referring Janet [name]: Janet [name] Treating Janet [name]/Extender: Janet [name] in Treatment: 10 Visit Information History Since Last Visit Added or deleted any medications: No Patient Arrived: Ambulatory Had a fall or experienced change in No Arrival Time: 08:32 activities of daily living that may affect Accompanied By: self risk of falls: Transfer Assistance: None Hospitalized since last visit: No Patient Requires Transmission-Based No Has Dressing in Place as Prescribed: Yes Precautions: Has Compression in Place as Prescribed: Yes Patient Has Alerts: Yes Pain Present Now: Yes Patient Alerts: Patient on Blood Thinner Aspirin 49m AVVS ABI/TBI 7/22 ABI L- 1.15/ R- 1.08 Electronic Signature(s) Signed: 11/07/2020 11:52:03 AM By: BDonnamarie PoagEntered By: BDonnamarie Poagon 11/07/2020 08:33:35 Call, [name] ([id number] -------------------------------------------------------------------------------- Clinic Level of Care Assessment Details Patient Name: [name] Date of Service: 11/07/2020 8:30 AM Medical Record Number: [id number] Account Number: [id number] of Birth/Sex: [birth year](70y.o. F) Treating RN: BDonnamarie PoagPrimary Care Janet [name]: MDelight StareOther Clinician: Referring Janet [name]: MDelight StareTreating Janet [name]/Extender: SSkipper Clichein Treatment: 10 Clinic Level of Care Assessment Items TOOL 1 Quantity Score [] - Use when EandM and Procedure is performed on INITIAL visit 0 ASSESSMENTS - Nursing Assessment / Reassessment [] - General Physical Exam (combine w/ comprehensive assessment (listed just below) when  performed on new 0 pt. evals) [] - 0 Comprehensive Assessment (HX, ROS, Risk Assessments, Wounds Hx, etc.) ASSESSMENTS - Wound and Skin Assessment / Reassessment [] - Dermatologic / Skin Assessment (not related to wound area) 0 ASSESSMENTS - Ostomy and/or Continence Assessment and Care [] - Incontinence Assessment and Management 0 [] - 0 Ostomy Care Assessment and Management (repouching, etc.) PROCESS - Coordination of Care [] - Simple Patient / Family Education for ongoing care 0 [] - 0 Complex (extensive) Patient / Family Education for ongoing care [] - 0 Staff obtains Consents, Records, Test Results / Process Orders [] - 0 Staff telephones HHA, Nursing Homes / Clarify orders / etc [] - 0 Routine Transfer to another Facility (non-emergent condition) [] - 0 Routine Hospital Admission (non-emergent condition) [] - 0 New Admissions / IBiomedical engineer/ Ordering NPWT, Apligraf, etc. [] - 0 Emergency Hospital Admission (emergent condition) PROCESS - Special Needs [] - Pediatric / Minor Patient Management 0 [] - 0 Isolation Patient Management [] - 0 Hearing / Language / Visual special needs [] - 0 Assessment of Community assistance (transportation, D/C planning, etc.) [] - 0 Additional assistance / Altered mentation [] - 0 Support Surface(s) Assessment (bed, cushion, seat, etc.) INTERVENTIONS - Miscellaneous [] - External ear exam 0 [] - 0 Patient Transfer (multiple staff / HCivil Service fast streamer/ Similar devices) [] - 0 Simple Staple / Suture removal (25 or less) [] - 0 Complex Staple / Suture removal (26 or more) [] - 0 Hypo/Hyperglycemic Management (do not check if billed separately) [] - 0 Ankle / Brachial Index (ABI) - do not check if billed separately Has the patient been seen at the hospital within the last three years: Yes Total Score: 0 Level Of Care: ____ [name]([id number] Electronic Signature(s) Signed: 11/07/2020 11:52:03 AM By: BDonnamarie PoagEntered By: BDonnamarie Poagon 11/07/2020 08:54:25 [name], [name] ([id number] -------------------------------------------------------------------------------- Compression Therapy  Details Patient Name: Janet [name], Janet [name]. Date of Service: 11/07/2020 8:30 AM Medical Record Number: [id number] Patient Account Number: [id number] Date of Birth/Sex: [birth year] (70 y.o. F) Treating RN: Janet [name] Primary Care Provider: Delight [name] Other Clinician: Referring Provider: Delight [name] Treating Provider/Extender: Janet [name] in Treatment: 10 Compression Therapy Performed for Wound Assessment: Wound #1 Left,Distal,Anterior Lower Leg Performed By: Janet Argyle, RN Compression Type: Three Layer Post Procedure Diagnosis Same as Pre-procedure Electronic Signature(s) Signed: 11/07/2020 11:52:03 AM By: Janet [name] Entered By: Janet [name] on 11/07/2020 08:52:27 Ealy, [name] ([id number]) -------------------------------------------------------------------------------- Compression Therapy Details Patient Name: Janet [name]. Date of Service: 11/07/2020 8:30 AM Medical Record Number: [id number] Patient Account Number: [id number] Date of Birth/Sex: Mar 28, 1950 (70 y.o. F) Treating RN: Janet [name] Primary Care Provider: Delight [name] Other Clinician: Referring Provider: Delight [name] Treating Provider/Extender: Janet [name] in Treatment: 10 Compression Therapy Performed for Wound Assessment: Wound #2 Left,Proximal,Anterior Lower Leg Performed By: Janet Argyle, RN Compression Type: Three Layer Post Procedure Diagnosis Same as Pre-procedure Electronic Signature(s) Signed: 11/07/2020 11:52:03 AM By: Janet [name] Entered By: Janet [name] on 11/07/2020 08:52:28 Martinez, [name] ([id number]) -------------------------------------------------------------------------------- Encounter Discharge Information Details Patient Name: Janet [name]. Date of Service: 11/07/2020 8:30 AM Medical Record  Number: [id number] Patient Account Number: [id number] Date of Birth/Sex: [birth year] (70 y.o. F) Treating RN: Janet [name] Primary Care Provider: Delight [name] Other Clinician: Referring Provider: Delight [name] Treating Provider/Extender: Janet [name] in Treatment: 10 Encounter Discharge Information Items Discharge Condition: Stable Ambulatory Status: Ambulatory Discharge Destination: Home Transportation: Private Auto Accompanied By: self Schedule Follow-up Appointment: Yes Clinical Summary of Care: Electronic Signature(s) Signed: 11/07/2020 11:52:03 AM By: Janet [name] Entered By: Janet [name] on 11/07/2020 09:07:35 Janet [name], [name] ([id number]) -------------------------------------------------------------------------------- Lower Extremity Assessment Details Patient Name: Janet [name]. Date of Service: 11/07/2020 8:30 AM Medical Record Number: [id number] Patient Account Number: [id number] Date of Birth/Sex: 28-Jul-1950 (70 y.o. F) Treating RN: Janet [name] Primary Care Provider: Delight [name] Other Clinician: Referring Provider: Delight [name] Treating Provider/Extender: Janet [name] in Treatment: 10 Edema Assessment Assessed: [Left: Yes] [Right: No] Edema: [Left: N] [Right: o] Calf Left: Right: Point of Measurement: 29 cm From Medial Instep 37.5 cm Ankle Left: Right: Point of Measurement: 10 cm From Medial Instep 23 cm Knee To Floor Left: Right: From Medial Instep 43 cm Vascular Assessment Pulses: Dorsalis Pedis Palpable: [Left:Yes] Electronic Signature(s) Signed: 11/07/2020 11:52:03 AM By: Janet [name] Entered By: Janet [name] on 11/07/2020 08:42:49 Janet [name], [name] ([id number]) -------------------------------------------------------------------------------- Multi Wound Chart Details Patient Name: Janet [name]. Date of Service: 11/07/2020 8:30 AM Medical Record Number: [id number] Patient Account Number: [id number] Date of Birth/Sex: [birth year] (70 y.o. F) Treating RN:  Janet [name] Primary Care Provider: Delight [name] Other Clinician: Referring Provider: Delight [name] Treating Provider/Extender: Janet [name] in Treatment: 10 Vital Signs Height(in): 63 Pulse(bpm): 79 Weight(lbs): 192 Blood Pressure(mmHg): 156/79 Body Mass Index(BMI): 31 Temperature(F): 98.2 Respiratory Rate(breaths/min): 16 Photos: [N/A:N/A] Wound Location: Left, Distal, Anterior Lower Leg Left, Proximal, Anterior Lower Leg N/A Wounding Event: Blister Blister N/A Primary Etiology: Venous Leg Ulcer Venous Leg Ulcer N/A Secondary Etiology: Lymphedema Lymphedema N/A Comorbid History: Asthma, Hypertension Asthma, Hypertension N/A Date Acquired: 05/15/2020 05/15/2020 N/A Weeks of Treatment: 10 10 N/A Wound Status: Open Open N/A Measurements L x W x D (cm) 3.6x1.9x0.1 0.7x0.5x0.1 N/A Area (cm) : 5.372 0.275 N/A Volume (cm) : 0.537 0.027 N/A % Reduction in Area: 10.00% 76.70% N/A % Reduction in Volume: 10.10% 77.10% N/A Classification: Full Thickness Without  Exposed Full Thickness Without Exposed N/A Support Structures Support Structures Exudate Amount: Medium Medium N/A Exudate Type: Serosanguineous Serosanguineous N/A Exudate Color: red, brown red, brown N/A Wound Margin: N/A Flat and Intact N/A Granulation Amount: Large (67-100%) Large (67-100%) N/A Granulation Quality: Red, Hyper-granulation Red, Hyper-granulation N/A Necrotic Amount: Small (1-33%) Small (1-33%) N/A Exposed Structures: Fat Layer (Subcutaneous Tissue): Fat Layer (Subcutaneous Tissue): N/A Yes Yes Fascia: No Fascia: No Tendon: No Tendon: No Muscle: No Muscle: No Joint: No Joint: No Bone: No Bone: No Epithelialization: None None N/A Treatment Notes Electronic Signature(s) Signed: 11/07/2020 11:52:03 AM By: Janet [name] Entered By: Janet [name] on 11/07/2020 08:43:31 Bartnick, [name] ([id number]) -------------------------------------------------------------------------------- [place] Details Patient Name: Janet [name]. Date of Service: 11/07/2020 8:30 AM Medical Record Number: [id number] Patient Account Number: [id number] Date of Birth/Sex: 1950/08/13 (70 y.o. F) Treating RN: Janet [name] Primary Care Provider: Delight [name] Other Clinician: Referring Provider: Delight [name] Treating Provider/Extender: Janet [name] in Treatment: 10 Active Inactive Wound/Skin Impairment Nursing Diagnoses: Impaired tissue integrity Knowledge deficit related to smoking impact on wound healing Knowledge deficit related to ulceration/compromised skin integrity Goals: Patient/caregiver will verbalize understanding of skin care regimen Date Initiated: 08/29/2020 Date Inactivated: 09/20/2020 Target Resolution Date: 09/14/2020 Goal Status: Met Ulcer/skin breakdown will have a volume reduction of 30% by week 4 Date Initiated: 08/29/2020 Date Inactivated: 10/17/2020 Target Resolution Date: 09/29/2020 Goal Status: Met Ulcer/skin breakdown will have a volume reduction of 50% by week 8 Date Initiated: 08/29/2020 Target Resolution Date: 10/29/2020 Goal Status: Active Ulcer/skin breakdown will have a volume reduction of 80% by week 12 Date Initiated: 08/29/2020 Target Resolution Date: 11/29/2020 Goal Status: Active Ulcer/skin breakdown will heal within 14 weeks Date Initiated: 08/29/2020 Target Resolution Date: 12/14/2020 Goal Status: Active Interventions: Assess patient/caregiver ability to obtain necessary supplies Assess patient/caregiver ability to perform ulcer/skin care regimen upon admission and as needed Assess ulceration(s) every visit Provide education on ulcer and skin care Notes: Electronic Signature(s) Signed: 11/07/2020 11:52:03 AM By: Janet [name] Entered By: Janet [name] on 11/07/2020 08:43:21 Gangi, [name] ([id number]) -------------------------------------------------------------------------------- Pain Assessment Details Patient Name: Janet [name]. Date of  Service: 11/07/2020 8:30 AM Medical Record Number: [id number] Patient Account Number: [id number] Date of Birth/Sex: [birth year] (71 y.o. F) Treating RN: Janet [name] Primary Care Provider: Delight [name] Other Clinician: Referring Provider: Delight [name] Treating Provider/Extender: Janet [name] in Treatment: 10 Active Problems Location of Pain Severity and Description of Pain Patient Has Paino Yes Site Locations Pain Location: Pain in Ulcers Rate the pain. Current Pain Level: 6 Pain Management and Medication Current Pain Management: Electronic Signature(s) Signed: 11/07/2020 11:52:03 AM By: Janet [name] Entered By: Janet [name] on 11/07/2020 08:35:57 Delosreyes, [name] ([id number]) -------------------------------------------------------------------------------- Patient/Caregiver Education Details Patient Name: Janet [name]. Date of Service: 11/07/2020 8:30 AM Medical Record Number: [id number] Patient Account Number: [id number] Date of Birth/Gender: 1950-04-17 (69 y.o. F) Treating RN: Janet [name] Primary Care Physician: Janet [name] Other Clinician: Referring Physician: Delight [name] Treating Physician/Extender: Janet [name] in Treatment: 10 Education Assessment Education Provided To: Patient Education Topics Provided Wound/Skin Impairment: Electronic Signature(s) Signed: 11/07/2020 11:52:03 AM By: Janet [name] Entered By: Janet [name] on 11/07/2020 08:54:44 Janet [name], [name] ([id number]) -------------------------------------------------------------------------------- Wound Assessment Details Patient Name: Janet [name]. Date of Service: 11/07/2020 8:30 AM Medical Record Number: [id number] Patient Account Number: [id number] Date of Birth/Sex: 1950-11-26 (70 y.o. F) Treating RN: Janet [name] Primary Care Provider: Delight [name] Other Clinician: Referring Provider: Delight [name] Treating Provider/Extender: Janet [name] in Treatment:  10 Wound Status Wound Number: 1 Primary  Etiology: Venous Leg Ulcer Wound Location: Left, Distal, Anterior Lower Leg Secondary Etiology: Lymphedema Wounding Event: Blister Wound Status: Open Date Acquired: 05/15/2020 Comorbid History: Asthma, Hypertension Weeks Of Treatment: 10 Clustered Wound: No Photos Wound Measurements Length: (cm) 3.6 Width: (cm) 1.9 Depth: (cm) 0.1 Area: (cm) 5.372 Volume: (cm) 0.537 % Reduction in Area: 10% % Reduction in Volume: 10.1% Epithelialization: None Tunneling: No Undermining: No Wound Description Classification: Full Thickness Without Exposed Support Structu Exudate Amount: Medium Exudate Type: Serosanguineous Exudate Color: red, brown res Foul Odor After Cleansing: No Slough/Fibrino Yes Wound Bed Granulation Amount: Large (67-100%) Exposed Structure Granulation Quality: Red, Hyper-granulation Fascia Exposed: No Necrotic Amount: Small (1-33%) Fat Layer (Subcutaneous Tissue) Exposed: Yes Necrotic Quality: Adherent Slough Tendon Exposed: No Muscle Exposed: No Joint Exposed: No Bone Exposed: No Treatment Notes Wound #1 (Lower Leg) Wound Laterality: Left, Anterior, Distal Cleanser Peri-Wound Care Triamcinolone Acetonide Cream, 0.1%, 15 (g) tube Discharge Instruction: apply to WOUND and peri Preziosi, [name] ([id number]) Topical Primary Dressing Hydrofera Blue Ready Transfer Foam, 2.5x2.5 (in/in) Discharge Instruction: Apply Hydrofera Blue Ready to wound bed as directed Secondary Dressing Secured With Compression Wrap Profore Lite LF 3 Multilayer Compression Bandaging System Discharge Instruction: Apply 3 multi-layer wrap as prescribed. Compression Stockings Add-Ons Electronic Signature(s) Signed: 11/07/2020 11:52:03 AM By: Janet [name] Entered By: Janet [name] on 11/07/2020 08:41:10 Janet [name], [name] ([id number]) -------------------------------------------------------------------------------- Wound Assessment Details Patient Name: Janet [name]. Date of Service: 11/07/2020  8:30 AM Medical Record Number: [id number] Patient Account Number: [id number] Date of Birth/Sex: February 18, 1950 (70 y.o. F) Treating RN: Janet [name] Primary Care Dayla Gasca: Janet [name] Other Clinician: Referring Jodean Valade: Janet [name] Treating Jj Enyeart/Extender: Janet [name] in Treatment: 10 Wound Status Wound Number: 2 Primary Etiology: Venous Leg Ulcer Wound Location: Left, Proximal, Anterior Lower Leg Secondary Etiology: Lymphedema Wounding Event: Blister Wound Status: Open Date Acquired: 05/15/2020 Comorbid History: Asthma, Hypertension Weeks Of Treatment: 10 Clustered Wound: No Photos Wound Measurements Length: (cm) 0.7 Width: (cm) 0.5 Depth: (cm) 0.1 Area: (cm) 0.275 Volume: (cm) 0.027 % Reduction in Area: 76.7% % Reduction in Volume: 77.1% Epithelialization: None Tunneling: No Undermining: No Wound Description Classification: Full Thickness Without Exposed Support Structu Wound Margin: Flat and Intact Exudate Amount: Medium Exudate Type: Serosanguineous Exudate Color: red, brown res Foul Odor After Cleansing: No Slough/Fibrino Yes Wound Bed Granulation Amount: Large (67-100%) Exposed Structure Granulation Quality: Red, Hyper-granulation Fascia Exposed: No Necrotic Amount: Small (1-33%) Fat Layer (Subcutaneous Tissue) Exposed: Yes Necrotic Quality: Adherent Slough Tendon Exposed: No Muscle Exposed: No Joint Exposed: No Bone Exposed: No Treatment Notes Wound #2 (Lower Leg) Wound Laterality: Left, Anterior, Proximal Cleanser Peri-Wound Care Topical Triamcinolone Acetonide Cream, 0.1%, 15 (g) tube Janet [name], [name] ([id number]) Discharge Instruction: apply to wound and peri-Apply as directed by Chima Astorino. Primary Dressing Hydrofera Blue Ready Transfer Foam, 2.5x2.5 (in/in) Discharge Instruction: Apply Hydrofera Blue Ready to wound bed as directed Secondary Dressing Secured With Compression Wrap Profore Lite LF 3 Multilayer Compression Bandaging  System Discharge Instruction: Apply 3 multi-layer wrap as prescribed. Compression Stockings Add-Ons Electronic Signature(s) Signed: 11/07/2020 11:52:03 AM By: Janet [name] Entered By: Janet [name] on 11/07/2020 08:41:47 Jablonsky, Olanda M. ([id number]) -------------------------------------------------------------------------------- Vitals Details Patient Name: Janet [name]. Date of Service: 11/07/2020 8:30 AM Medical Record Number: [id number] Patient Account Number: [id number] Date of Birth/Sex: 1950-11-01 (70 y.o. F) Treating RN: Janet [name] Primary Care Ariely Riddell: Janet [name] Other Clinician: Referring Daltyn Degroat: Janet [name] Treating Abdon Petrosky/Extender: Janet [name] in Treatment: 10 Vital Signs  Time Taken: 08:34 Temperature (F): 98.2 Height (in): 66 Pulse (bpm): 72 Weight (lbs): 192 Respiratory Rate (breaths/min): 16 Body Mass Index (BMI): 31 Blood Pressure (mmHg): 156/79 Reference Range: 80 - 120 mg / dl Electronic Signature(s) Signed: 11/07/2020 11:52:03 AM By: Janet [name] Entered ByDonnamarie [name] on 11/07/2020 08:35:48

## 2020-11-07 NOTE — Progress Notes (Addendum)
SAMERA, MACY ([id number]) Visit Report for 11/07/2020 Chief Complaint Document Details Patient Name: Janet [name], Janet [name]. Date of Service: 11/07/2020 8:30 AM Medical Record Number: [id number] Patient Account Number: [id number] Date of Birth/Sex: 10-Mar-1950 (70 y.o. F) Treating RN: Donnamarie Poag Primary Care Provider: Delight Stare Other Clinician: Referring Provider: Delight Stare Treating Provider/Extender: Skipper Cliche in Treatment: 10 Information Obtained from: Patient Chief Complaint Left LE Ulcers Electronic Signature(s) Signed: 11/07/2020 8:50:47 AM By: Worthy Keeler PA-C Entered By: Worthy Keeler on 11/07/2020 08:50:46 [name], [name] ([id number]) -------------------------------------------------------------------------------- HPI Details Patient Name: Janet [name]. Date of Service: 11/07/2020 8:30 AM Medical Record Number: [id number] Patient Account Number: [id number] Date of Birth/Sex: June 27, 1950 (70 y.o. F) Treating RN: Donnamarie Poag Primary Care Provider: Delight Stare Other Clinician: Referring Provider: Delight Stare Treating Provider/Extender: Skipper Cliche in Treatment: 10 History of Present Illness HPI Description: 08/29/2020 upon evaluation today patient appears to be doing somewhat poorly in regard to what appeared to be venous leg ulcers on the left leg that she has been dealing with since around the beginning of May. Fortunately she does not have any signs of obvious infection unfortunately she does have quite a bit of necrotic tissue that does not really need to be debrided. She has been seeing vascular and they did wrap her for a time with what was likely an Haematologist. With that being said the [facility] boot has given her some trouble as far as feeling claustrophobic was concerned she is not sure if she is good to be able to tolerate a compression wrap currently although we will get a try she is in agreement with Aleve given this shot. Fortunately I do not see any signs  of active infection systemically which is great news and even locally she seems to be doing quite well today. Patient's Right ABI is 1.08 with a TBI of 0.79 and the left ABI is 1.15 with a TBI of 0.84 on 08/03/20. She also has a history of chronic venous insufficiency and lymphedema but otherwise no major medical problems. 09/05/2020 upon evaluation today patient unfortunately appears to be having some issues today with her right leg this is completely different from what we have been taking care of on the left. She actually has some swelling and an area of erythema and pain which is in the medial calf region. There is no identifiable abscess definitely this could just be cellulitis but with her history of DVT I think that we need to have this checked out. 09/12/2020 upon evaluation today patient appears to be doing better in regard to her wound. She has been tolerating the dressing changes without complication. Fortunately there does not appear to be any signs of active infection which is great news and overall I think that the patient is doing well in this regard. Her wounds are measuring smaller at both locations and the surface of the wound is looking better. With that being said she does have a wedding that she is supposed to be going to this weekend and states that she really needs to not have the wrap on for that. Nonetheless regular and discuss options to try to help maintain while she has this time with the wedding. 09/20/2020 upon evaluation today patient unfortunately is doing worse in regard to the pain associated with her wound at this time. Fortunately there does not appear to be any evidence of active infection systemically which is great news but in general I feel like locally she may  definitely be infected. Subsequently I do believe that we need to address this as soon as possible. 09/26/2020 upon evaluation today patient appears to be doing well with regard to her wound. Fortunately there  does not appear to be any signs of worsening in general. I think that overall the patient is making wonderful progress in my opinion. The right leg where she had an area of erythema medially also is better today which is great news. I think Levaquin has done a great job in that regard. We did see evidence still some erythema on the right leg but again this is significantly improved compared to last week. Overall the Levaquin has done a great job in this regard. 10/03/2020 upon evaluation today patient appears to be doing well all things considered with regard to her wounds. There does not appear to be any signs of infection obvious at this point which is great news. Fortunately I think that she is definitely making progress with regard to the appearance of the wound bed but nonetheless is still having quite a bit of an issue here with the pain aspect. Fortunately I think that we have some other options to consider him and use triamcinolone underneath the [facility] today. Also think looking into PuraPly/Apligraf as a treatment could be appropriate for this patient and my hope is that we will be able to get the wounds growing in a much faster pace as far as new tissue is concerned to get this closed sooner rather than later as that is the only way that we will get a get the pain completely resolved for her which is ultimately the goal we have as well obviously. 9/26; 2 wounds on the left anterior lower leg in the setting of what appears to be severe chronic venous insufficiency. We have been using Hydrofera Blue and 3 layer compression. 10/17/2020 upon evaluation today patient appears to be doing well with regard to her leg ulcer. She has been tolerating the dressing changes without complication. Fortunately there does not appear to be any signs of active infection at this time. No fevers, chills, nausea, vomiting, or diarrhea. 10/10; 2 wounds on the left lateral calf. She has been using Hydrofera  Blue. Still complains of pain. She is on her second week of that was started last week. 10/31/2020 upon evaluation today patient's wound bed actually showed signs of good granulation and epithelization at this point. Fortunately there does not appear to be any signs of active infection which is great news and overall very pleased with where we stand. I do think that the patient is making excellent progress based on what we are seeing I believe the triamcinolone along with a Hydrofera Blue is doing a great job. 11/07/2020 upon evaluation today patient appears to be doing well with regard to her wounds. This is going slowly but nonetheless I believe that the fact that she is responding to the triamcinolone and is doing better is a good sign I also believe this is a sign that were definitely dealing with more of an inflammatory process. Nonetheless I am extremely happy with where things stand today. Electronic Signature(s) Signed: 11/07/2020 9:16:39 AM By: Worthy Keeler PA-C Entered By: Worthy Keeler on 11/07/2020 09:16:39 Janet [name], [name] ([id number]) -------------------------------------------------------------------------------- Physical Exam Details Patient Name: Janet [name]. Date of Service: 11/07/2020 8:30 AM Medical Record Number: [id number] Patient Account Number: [id number] Date of Birth/Sex: 27-Sep-1950 (70 y.o. F) Treating RN: Donnamarie Poag Primary Care Provider: Delight Stare Other Clinician:  Referring Provider: Delight Stare Treating Provider/Extender: Skipper Cliche in Treatment: 43 Constitutional Well-nourished and well-hydrated in no acute distress. Respiratory normal breathing without difficulty. Psychiatric this patient is able to make decisions and demonstrates good insight into disease process. Alert and Oriented x 3. pleasant and cooperative. Notes Upon inspection patient's wound bed actually showed signs of good granulation and epithelization at this point. Fortunately  there does not appear to be any signs of active infection systemically which is great news and overall I think that this is doing quite well. Electronic Signature(s) Signed: 11/07/2020 9:16:54 AM By: Worthy Keeler PA-C Entered By: Worthy Keeler on 11/07/2020 09:16:53 Janet [name], [name] ([id number]) -------------------------------------------------------------------------------- Physician Orders Details Patient Name: Janet [name]. Date of Service: 11/07/2020 8:30 AM Medical Record Number: [id number] Patient Account Number: [id number] Date of Birth/Sex: 1950/08/14 (70 y.o. F) Treating RN: Donnamarie Poag Primary Care Provider: Delight Stare Other Clinician: Referring Provider: Delight Stare Treating Provider/Extender: Skipper Cliche in Treatment: 10 Verbal / Phone Orders: No Diagnosis Coding ICD-10 Coding Code Description I87.2 Venous insufficiency (chronic) (peripheral) I89.0 Lymphedema, not elsewhere classified 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 of other part of left lower leg with fat layer exposed Follow-up Appointments o Return Appointment in 1 week. o Nurse Visit as needed Bathing/ Shower/ Hygiene o May shower with wound dressing protected with water repellent cover or cast protector. o No tub bath. Edema Control - Lymphedema / Segmental Compressive Device / Other o Optional: One layer of unna paste to top of compression wrap (to act as an anchor). - left leg o Patient to wear own compression stockings. Remove compression stockings every night before going to bed and put on every morning when getting up. - right leg-see Elastic Therapy contact and measurements o Patient to wear own Velcro compression garment. Remove compression stockings every night before going to bed and put on every morning when getting up. - will be mailed to you, "JUXTELITE" o Elevate legs to the level of the heart and pump ankles as often as possible o Elevate leg(s) parallel to the floor  when sitting. o DO YOUR BEST to sleep in the bed at night. DO NOT sleep in your recliner. Long hours of sitting in a recliner leads to swelling of the legs and/or potential wounds on your backside. Medications-Please add to medication list. o Take one 500mg  Tylenol (Acetaminophen) and one 200mg  Motrin (Ibuprofen) every 6 hours for pain. Do not take ibuprofen if you are on blood thinners or have stomach ulcers. - as needed for pain if not contraindicated by other physician o P.O. Antibiotics - Continue the Levaquin and take as prescribed Wound Treatment Wound #1 - Lower Leg Wound Laterality: Left, Anterior, Distal Peri-Wound Care: Triamcinolone Acetonide Cream, 0.1%, 15 (g) tube 1 x Per Week/30 Days Discharge Instructions: apply to WOUND and peri Primary Dressing: Hydrofera Blue Ready Transfer Foam, 2.5x2.5 (in/in) 1 x Per Week/30 Days Discharge Instructions: Apply Hydrofera Blue Ready to wound bed as directed Compression Wrap: Profore Lite LF 3 Multilayer Compression Bandaging System 1 x Per Week/30 Days Discharge Instructions: Apply 3 multi-layer wrap as prescribed. Wound #2 - Lower Leg Wound Laterality: Left, Anterior, Proximal Topical: Triamcinolone Acetonide Cream, 0.1%, 15 (g) tube 1 x Per Week/30 Days Discharge Instructions: apply to wound and peri-Apply as directed by provider. Primary Dressing: Hydrofera Blue Ready Transfer Foam, 2.5x2.5 (in/in) 1 x Per Week/30 Days Discharge Instructions: Apply Hydrofera Blue Ready to wound bed as directed Compression Wrap: Profore Lite LF 3 Multilayer Compression  Bandaging System 1 x Per Week/30 Days Discharge Instructions: Apply 3 multi-layer wrap as prescribed. Electronic Signature(s) Janet [name], [name] ([id number]) Signed: 11/07/2020 11:52:03 AM By: Donnamarie Poag Signed: 11/07/2020 4:31:19 PM By: Worthy Keeler PA-C Entered By: Donnamarie Poag on 11/07/2020 08:54:15 Janet [name], [name]  ([id number]) -------------------------------------------------------------------------------- Problem List Details Patient Name: Janet [name]. Date of Service: 11/07/2020 8:30 AM Medical Record Number: [id number] Patient Account Number: [id number] Date of Birth/Sex: 1950-04-07 (70 y.o. F) Treating RN: Donnamarie Poag Primary Care Provider: Delight Stare Other Clinician: Referring Provider: Delight Stare Treating Provider/Extender: Skipper Cliche in Treatment: 10 Active Problems ICD-10 Encounter Code Description Active Date MDM Diagnosis I87.2 Venous insufficiency (chronic) (peripheral) 08/29/2020 No Yes I89.0 Lymphedema, not elsewhere classified 08/29/2020 No Yes 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 of other part of left lower leg with fat layer 08/29/2020 No Yes exposed Inactive Problems ICD-10 Code Description Active Date Inactive Date L03.115 Cellulitis of right lower limb 09/05/2020 09/05/2020 R22.41 Localized swelling, mass and lump, right lower limb 09/05/2020 09/05/2020 Resolved Problems Electronic Signature(s) Signed: 11/07/2020 8:50:42 AM By: Worthy Keeler PA-C Entered By: Worthy Keeler on 11/07/2020 08:50:41 Janet [name], [name] ([id number]) -------------------------------------------------------------------------------- Progress Note Details Patient Name: Janet [name]. Date of Service: 11/07/2020 8:30 AM Medical Record Number: [id number] Patient Account Number: [id number] Date of Birth/Sex: October 31, 1950 (70 y.o. F) Treating RN: Donnamarie Poag Primary Care Provider: Delight Stare Other Clinician: Referring Provider: Delight Stare Treating Provider/Extender: Skipper Cliche in Treatment: 10 Subjective Chief Complaint Information obtained from Patient Left LE Ulcers History of Present Illness (HPI) 08/29/2020 upon evaluation today patient appears to be doing somewhat poorly in regard to what appeared to be venous leg ulcers on the left leg that she has been dealing with since around  the beginning of May. Fortunately she does not have any signs of obvious infection unfortunately she does have quite a bit of necrotic tissue that does not really need to be debrided. She has been seeing vascular and they did wrap her for a time with what was likely an Haematologist. With that being said the [facility] boot has given her some trouble as far as feeling claustrophobic was concerned she is not sure if she is good to be able to tolerate a compression wrap currently although we will get a try she is in agreement with Aleve given this shot. Fortunately I do not see any signs of active infection systemically which is great news and even locally she seems to be doing quite well today. Patient's Right ABI is 1.08 with a TBI of 0.79 and the left ABI is 1.15 with a TBI of 0.84 on 08/03/20. She also has a history of chronic venous insufficiency and lymphedema but otherwise no major medical problems. 09/05/2020 upon evaluation today patient unfortunately appears to be having some issues today with her right leg this is completely different from what we have been taking care of on the left. She actually has some swelling and an area of erythema and pain which is in the medial calf region. There is no identifiable abscess definitely this could just be cellulitis but with her history of DVT I think that we need to have this checked out. 09/12/2020 upon evaluation today patient appears to be doing better in regard to her wound. She has been tolerating the dressing changes without complication. Fortunately there does not appear to be any signs of active infection which is great news and overall I think that the patient is doing well in this  regard. Her wounds are measuring smaller at both locations and the surface of the wound is looking better. With that being said she does have a wedding that she is supposed to be going to this weekend and states that she really needs to not have the wrap on for that. Nonetheless  regular and discuss options to try to help maintain while she has this time with the wedding. 09/20/2020 upon evaluation today patient unfortunately is doing worse in regard to the pain associated with her wound at this time. Fortunately there does not appear to be any evidence of active infection systemically which is great news but in general I feel like locally she may definitely be infected. Subsequently I do believe that we need to address this as soon as possible. 09/26/2020 upon evaluation today patient appears to be doing well with regard to her wound. Fortunately there does not appear to be any signs of worsening in general. I think that overall the patient is making wonderful progress in my opinion. The right leg where she had an area of erythema medially also is better today which is great news. I think Levaquin has done a great job in that regard. We did see evidence still some erythema on the right leg but again this is significantly improved compared to last week. Overall the Levaquin has done a great job in this regard. 10/03/2020 upon evaluation today patient appears to be doing well all things considered with regard to her wounds. There does not appear to be any signs of infection obvious at this point which is great news. Fortunately I think that she is definitely making progress with regard to the appearance of the wound bed but nonetheless is still having quite a bit of an issue here with the pain aspect. Fortunately I think that we have some other options to consider him and use triamcinolone underneath the [facility] today. Also think looking into PuraPly/Apligraf as a treatment could be appropriate for this patient and my hope is that we will be able to get the wounds growing in a much faster pace as far as new tissue is concerned to get this closed sooner rather than later as that is the only way that we will get a get the pain completely resolved for her which is ultimately the  goal we have as well obviously. 9/26; 2 wounds on the left anterior lower leg in the setting of what appears to be severe chronic venous insufficiency. We have been using Hydrofera Blue and 3 layer compression. 10/17/2020 upon evaluation today patient appears to be doing well with regard to her leg ulcer. She has been tolerating the dressing changes without complication. Fortunately there does not appear to be any signs of active infection at this time. No fevers, chills, nausea, vomiting, or diarrhea. 10/10; 2 wounds on the left lateral calf. She has been using Hydrofera Blue. Still complains of pain. She is on her second week of that was started last week. 10/31/2020 upon evaluation today patient's wound bed actually showed signs of good granulation and epithelization at this point. Fortunately there does not appear to be any signs of active infection which is great news and overall very pleased with where we stand. I do think that the patient is making excellent progress based on what we are seeing I believe the triamcinolone along with a Hydrofera Blue is doing a great job. 11/07/2020 upon evaluation today patient appears to be doing well with regard to her wounds. This  is going slowly but nonetheless I believe that the fact that she is responding to the triamcinolone and is doing better is a good sign I also believe this is a sign that were definitely dealing with more of an inflammatory process. Nonetheless I am extremely happy with where things stand today. Janet [name], [name] ([id number]) Objective Constitutional Well-nourished and well-hydrated in no acute distress. Vitals Time Taken: 8:34 AM, Height: 66 in, Weight: 192 lbs, BMI: 31, Temperature: 98.2 F, Pulse: 72 bpm, Respiratory Rate: 16 breaths/min, Blood Pressure: 156/79 mmHg. Respiratory normal breathing without difficulty. Psychiatric this patient is able to make decisions and demonstrates good insight into disease process. Alert and  Oriented x 3. pleasant and cooperative. General Notes: Upon inspection patient's wound bed actually showed signs of good granulation and epithelization at this point. Fortunately there does not appear to be any signs of active infection systemically which is great news and overall I think that this is doing quite well. Integumentary (Hair, Skin) Wound #1 status is Open. Original cause of wound was Blister. The date acquired was: 05/15/2020. The wound has been in treatment 10 weeks. The wound is located on the [facility] Lower Leg. The wound measures 3.6cm length x 1.9cm width x 0.1cm depth; 5.372cm^2 area and 0.537cm^3 volume. There is Fat Layer (Subcutaneous Tissue) exposed. There is no tunneling or undermining noted. There is a medium amount of serosanguineous drainage noted. There is large (67-100%) red, hyper - granulation within the wound bed. There is a small (1-33%) amount of necrotic tissue within the wound bed including Adherent Slough. Wound #2 status is Open. Original cause of wound was Blister. The date acquired was: 05/15/2020. The wound has been in treatment 10 weeks. The wound is located on the Left,Proximal,Anterior Lower Leg. The wound measures 0.7cm length x 0.5cm width x 0.1cm depth; 0.275cm^2 area and 0.027cm^3 volume. There is Fat Layer (Subcutaneous Tissue) exposed. There is no tunneling or undermining noted. There is a medium amount of serosanguineous drainage noted. The wound margin is flat and intact. There is large (67-100%) red, hyper - granulation within the wound bed. There is a small (1-33%) amount of necrotic tissue within the wound bed including Adherent Slough. Assessment Active Problems ICD-10 Venous insufficiency (chronic) (peripheral) Lymphedema, not elsewhere classified Non-pressure chronic ulcer of other part of left lower leg with fat layer exposed Procedures Wound #1 Pre-procedure diagnosis of Wound #1 is a Venous Leg Ulcer located on the  Left,Distal,Anterior Lower Leg . There was a Three Layer Compression Therapy Procedure by Donnamarie Poag, RN. Post procedure Diagnosis Wound #1: Same as Pre-Procedure Wound #2 Pre-procedure diagnosis of Wound #2 is a Venous Leg Ulcer located on the Left,Proximal,Anterior Lower Leg . There was a Three Layer Compression Therapy Procedure by Donnamarie Poag, RN. Post procedure Diagnosis Wound #2: Same as Pre-Procedure Plan Follow-up Appointments: Return Appointment in 1 week. Janet [name], Janet [name] ([id number]) Nurse Visit as needed Bathing/ Shower/ Hygiene: May shower with wound dressing protected with water repellent cover or cast protector. No tub bath. Edema Control - Lymphedema / Segmental Compressive Device / Other: Optional: One layer of unna paste to top of compression wrap (to act as an anchor). - left leg Patient to wear own compression stockings. Remove compression stockings every night before going to bed and put on every morning when getting up. - right leg-see Elastic Therapy contact and measurements Patient to wear own Velcro compression garment. Remove compression stockings every night before going to bed and put on every morning when getting  up. - will be mailed to you, "JUXTELITE" Elevate legs to the level of the heart and pump ankles as often as possible Elevate leg(s) parallel to the floor when sitting. DO YOUR BEST to sleep in the bed at night. DO NOT sleep in your recliner. Long hours of sitting in a recliner leads to swelling of the legs and/or potential wounds on your backside. Medications-Please add to medication list.: Take one 500mg  Tylenol (Acetaminophen) and one 200mg  Motrin (Ibuprofen) every 6 hours for pain. Do not take ibuprofen if you are on blood thinners or have stomach ulcers. - as needed for pain if not contraindicated by other physician P.O. Antibiotics - Continue the Levaquin and take as prescribed WOUND #1: - Lower Leg Wound Laterality: Left, Anterior,  Distal Peri-Wound Care: Triamcinolone Acetonide Cream, 0.1%, 15 (g) tube 1 x Per Week/30 Days Discharge Instructions: apply to WOUND and peri Primary Dressing: Hydrofera Blue Ready Transfer Foam, 2.5x2.5 (in/in) 1 x Per Week/30 Days Discharge Instructions: Apply Hydrofera Blue Ready to wound bed as directed Compression Wrap: Profore Lite LF 3 Multilayer Compression Bandaging System 1 x Per Week/30 Days Discharge Instructions: Apply 3 multi-layer wrap as prescribed. WOUND #2: - Lower Leg Wound Laterality: Left, Anterior, Proximal Topical: Triamcinolone Acetonide Cream, 0.1%, 15 (g) tube 1 x Per Week/30 Days Discharge Instructions: apply to wound and peri-Apply as directed by provider. Primary Dressing: Hydrofera Blue Ready Transfer Foam, 2.5x2.5 (in/in) 1 x Per Week/30 Days Discharge Instructions: Apply Hydrofera Blue Ready to wound bed as directed Compression Wrap: Profore Lite LF 3 Multilayer Compression Bandaging System 1 x Per Week/30 Days Discharge Instructions: Apply 3 multi-layer wrap as prescribed. 1. Would recommend that we going continue with the wound care measures as before and the patient is in agreement with the plan. This includes the use of the [facility] along with the 3 layer compression wrap. 2. I am also can recommend that we have the patient going to continue with the triamcinolone to the wound bed which I think is doing a great job for her as well. 3. I would also suggest patient continue to elevate her legs much as possible to help with edema control. She still states that she is having numbness in her foot although this was happening before the wrap it seems to have been worse since the wrap was put in place. Again I do not think it is causing it and I think her blood flow is appropriate but nonetheless this is still something that is of concern and we will keep an eye on things she may need to see a neurologist if it continues to be an issue. We will see patient  back for reevaluation in 1 week here in the clinic. If anything worsens or changes patient will contact our office for additional recommendations. Electronic Signature(s) Signed: 11/07/2020 9:17:54 AM By: Worthy Keeler PA-C Entered By: Worthy Keeler on 11/07/2020 09:17:54 Janet [name], [name] ([id number]) -------------------------------------------------------------------------------- SuperBill Details Patient Name: Janet [name]. Date of Service: 11/07/2020 Medical Record Number: [id number] Patient Account Number: [id number] Date of Birth/Sex: 1950-03-16 (70 y.o. F) Treating RN: Donnamarie Poag Primary Care Provider: Delight Stare Other Clinician: Referring Provider: Delight Stare Treating Provider/Extender: Skipper Cliche in Treatment: 10 Diagnosis Coding ICD-10 Codes Code Description I87.2 Venous insufficiency (chronic) (peripheral) I89.0 Lymphedema, not elsewhere classified 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 of other part of left lower leg with fat layer exposed Facility Procedures CPT4 Code: 47425956 Description: (Facility Use Only) [place] COMPRS LWR  LT LEG Modifier: Quantity: 1 Physician Procedures CPT4 Code: 7673419 Description: 37902 - WC PHYS LEVEL 4 - EST PT Modifier: Quantity: 1 CPT4 Code: Description: ICD-10 Diagnosis Description I87.2 Venous insufficiency (chronic) (peripheral) I89.0 Lymphedema, not elsewhere classified 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 of other part of left lower leg with fat lay Modifier: er exposed Quantity: Electronic Signature(s) Signed: 11/07/2020 9:18:16 AM By: Worthy Keeler PA-C Entered By: Worthy Keeler on 11/07/2020 09:18:16

## 2020-11-14 ENCOUNTER — Encounter: Payer: Medicare Other | Admitting: Physician Assistant

## 2020-11-14 ENCOUNTER — Other Ambulatory Visit: Payer: Self-pay

## 2020-11-14 DIAGNOSIS — L97822 Non-pressure chronic ulcer of other part of left lower leg with fat layer exposed: Secondary | ICD-10-CM | POA: Diagnosis not present

## 2020-11-14 NOTE — Progress Notes (Signed)
Janet, [name] ([id number]) Visit Report for 11/14/2020 Arrival Information Details Patient Name: Janet [name], Janet [name]. Date of Service: 11/14/2020 8:30 AM Medical Record Number: [id number] Patient Account Number: [id number] Date of Birth/Sex: 31-Mar-1950 (70 y.o. F) Treating RN: Donnamarie Poag Primary Care Frankie Scipio: Delight Stare Other Clinician: Referring Aiyanah Kalama: Delight Stare Treating Celena Lanius/Extender: Skipper Cliche in Treatment: 11 Visit Information History Since Last Visit Added or deleted any medications: No Patient Arrived: Ambulatory Had a fall or experienced change in No Arrival Time: 08:41 activities of daily living that may affect Accompanied By: self risk of falls: Transfer Assistance: None Hospitalized since last visit: No Patient Identification Verified: Yes Has Dressing in Place as Prescribed: Yes Secondary Verification Process Completed: Yes Has Compression in Place as Prescribed: Yes Patient Requires Transmission-Based No Pain Present Now: Yes Precautions: Patient Has Alerts: Yes Patient Alerts: Patient on Blood Thinner Aspirin 18m AVVS ABI/TBI 7/22 ABI L- 1.15/ R- 1.08 Electronic Signature(s) Signed: 11/14/2020 1:57:02 PM By: BDonnamarie PoagEntered By: BDonnamarie Poagon 11/14/2020 08:42:07 Cage, Laneya M. ([id number] -------------------------------------------------------------------------------- Clinic Level of Care Assessment Details Patient Name: Janet [name] Date of Service: 11/14/2020 8:30 AM Medical Record Number: [id number] Account Number: [id number] of Birth/Sex: 208-04-1950(70y.o. F) Treating RN: BDonnamarie PoagPrimary Care Ferrah Panagopoulos: MDelight StareOther Clinician: Referring Naziah Weckerly: MDelight StareTreating Devron Cohick/Extender: SSkipper Clichein Treatment: 11 Clinic Level of Care Assessment Items TOOL 1 Quantity Score '[]'  - Use when EandM and Procedure is performed on INITIAL visit 0 ASSESSMENTS - Nursing Assessment / Reassessment '[]'  - General  Physical Exam (combine w/ comprehensive assessment (listed just below) when performed on new 0 pt. evals) '[]'  - 0 Comprehensive Assessment (HX, ROS, Risk Assessments, Wounds Hx, etc.) ASSESSMENTS - Wound and Skin Assessment / Reassessment '[]'  - Dermatologic / Skin Assessment (not related to wound area) 0 ASSESSMENTS - Ostomy and/or Continence Assessment and Care '[]'  - Incontinence Assessment and Management 0 '[]'  - 0 Ostomy Care Assessment and Management (repouching, etc.) PROCESS - Coordination of Care '[]'  - Simple Patient / Family Education for ongoing care 0 '[]'  - 0 Complex (extensive) Patient / Family Education for ongoing care '[]'  - 0 Staff obtains CProgrammer, systems Records, Test Results / Process Orders '[]'  - 0 Staff telephones HHA, Nursing Homes / Clarify orders / etc '[]'  - 0 Routine Transfer to another Facility (non-emergent condition) '[]'  - 0 Routine Hospital Admission (non-emergent condition) '[]'  - 0 New Admissions / IBiomedical engineer/ Ordering NPWT, Apligraf, etc. '[]'  - 0 Emergency Hospital Admission (emergent condition) PROCESS - Special Needs '[]'  - Pediatric / Minor Patient Management 0 '[]'  - 0 Isolation Patient Management '[]'  - 0 Hearing / Language / Visual special needs '[]'  - 0 Assessment of Community assistance (transportation, D/C planning, etc.) '[]'  - 0 Additional assistance / Altered mentation '[]'  - 0 Support Surface(s) Assessment (bed, cushion, seat, etc.) INTERVENTIONS - Miscellaneous '[]'  - External ear exam 0 '[]'  - 0 Patient Transfer (multiple staff / HCivil Service fast streamer/ Similar devices) '[]'  - 0 Simple Staple / Suture removal (25 or less) '[]'  - 0 Complex Staple / Suture removal (26 or more) '[]'  - 0 Hypo/Hyperglycemic Management (do not check if billed separately) '[]'  - 0 Ankle / Brachial Index (ABI) - do not check if billed separately Has the patient been seen at the hospital within the last three years: Yes Total Score: 0 Level Of Care: ____ Janet [name] ([id number] Electronic Signature(s) Signed: 11/14/2020 1:57:02 PM By: BDonnamarie PoagEntered By: BDonnamarie Poagon  11/14/2020 09:15:30 Sugrue, Wyoma M. ([id number]) -------------------------------------------------------------------------------- Compression Therapy Details Patient Name: Janet [name]. Date of Service: 11/14/2020 8:30 AM Medical Record Number: [id number] Patient Account Number: [id number] Date of Birth/Sex: 1950-08-20 (70 y.o. F) Treating RN: Donnamarie Poag Primary Care Atira Borello: Delight Stare Other Clinician: Referring Lulani Bour: Delight Stare Treating Euretha Najarro/Extender: Skipper Cliche in Treatment: 11 Compression Therapy Performed for Wound Assessment: Wound #1 Left,Distal,Anterior Lower Leg Performed By: Junius Argyle, RN Compression Type: Three Layer Post Procedure Diagnosis Same as Pre-procedure Electronic Signature(s) Signed: 11/14/2020 1:57:02 PM By: Donnamarie Poag Entered By: Donnamarie Poag on 11/14/2020 09:06:52 Pitera, Arina M. ([id number]) -------------------------------------------------------------------------------- Compression Therapy Details Patient Name: Janet [name]. Date of Service: 11/14/2020 8:30 AM Medical Record Number: [id number] Patient Account Number: [id number] Date of Birth/Sex: 09/09/1950 (70 y.o. F) Treating RN: Donnamarie Poag Primary Care Verita Kuroda: Delight Stare Other Clinician: Referring Kenedee Molesky: Delight Stare Treating Chenoah Mcnally/Extender: Skipper Cliche in Treatment: 11 Compression Therapy Performed for Wound Assessment: Wound #2 Left,Proximal,Anterior Lower Leg Performed By: Junius Argyle, RN Compression Type: Three Layer Post Procedure Diagnosis Same as Pre-procedure Electronic Signature(s) Signed: 11/14/2020 1:57:02 PM By: Donnamarie Poag Entered By: Donnamarie Poag on 11/14/2020 09:06:52 Cardella, Maribelle M. ([id number]) -------------------------------------------------------------------------------- Encounter Discharge Information  Details Patient Name: Janet [name]. Date of Service: 11/14/2020 8:30 AM Medical Record Number: [id number] Patient Account Number: [id number] Date of Birth/Sex: 11/29/1950 (70 y.o. F) Treating RN: Donnamarie Poag Primary Care Yaiden Yang: Delight Stare Other Clinician: Referring Nzinga Ferran: Delight Stare Treating Swan Fairfax/Extender: Skipper Cliche in Treatment: 11 Encounter Discharge Information Items Discharge Condition: Stable Ambulatory Status: Ambulatory Discharge Destination: Home Transportation: Private Auto Accompanied By: self Schedule Follow-up Appointment: Yes Clinical Summary of Care: Electronic Signature(s) Signed: 11/14/2020 1:57:02 PM By: Donnamarie Poag Entered By: Donnamarie Poag on 11/14/2020 09:18:03 Kilian, Lachanda M. ([id number]) -------------------------------------------------------------------------------- Lower Extremity Assessment Details Patient Name: Janet [name]. Date of Service: 11/14/2020 8:30 AM Medical Record Number: [id number] Patient Account Number: [id number] Date of Birth/Sex: 1950/12/29 (70 y.o. F) Treating RN: Donnamarie Poag Primary Care Shaddai Shapley: Delight Stare Other Clinician: Referring Melita Villalona: Delight Stare Treating Martin Smeal/Extender: Skipper Cliche in Treatment: 11 Edema Assessment Assessed: [Left: No] [Right: No] [Left: Edema] [Right: :] Calf Left: Right: Point of Measurement: 29 cm From Medial Instep 38 cm Ankle Left: Right: Point of Measurement: 10 cm From Medial Instep 23 cm Knee To Floor Left: Right: From Medial Instep 43 cm Vascular Assessment Pulses: Dorsalis Pedis Palpable: [Left:Yes] Electronic Signature(s) Signed: 11/14/2020 1:57:02 PM By: Donnamarie Poag Entered By: Donnamarie Poag on 11/14/2020 08:50:17 Wiginton, Zandra M. ([id number]) -------------------------------------------------------------------------------- Multi Wound Chart Details Patient Name: Janet [name]. Date of Service: 11/14/2020 8:30 AM Medical Record Number: [id number] Patient  Account Number: [id number] Date of Birth/Sex: 28-Oct-1950 (70 y.o. F) Treating RN: Donnamarie Poag Primary Care Saga Balthazar: Delight Stare Other Clinician: Referring Athziry Millican: Delight Stare Treating Ivylynn Hoppes/Extender: Skipper Cliche in Treatment: 11 Vital Signs Height(in): 23 Pulse(bpm): 61 Weight(lbs): 192 Blood Pressure(mmHg): 135/79 Body Mass Index(BMI): 31 Temperature(F): 97.9 Respiratory Rate(breaths/min): 16 Photos: [N/A:N/A] Wound Location: Left, Distal, Anterior Lower Leg Left, Proximal, Anterior Lower Leg N/A Wounding Event: Blister Blister N/A Primary Etiology: Venous Leg Ulcer Venous Leg Ulcer N/A Secondary Etiology: Lymphedema Lymphedema N/A Comorbid History: Asthma, Hypertension Asthma, Hypertension N/A Date Acquired: 05/15/2020 05/15/2020 N/A Weeks of Treatment: 11 11 N/A Wound Status: Open Open N/A Measurements L x W x D (cm) 3.2x1.6x0.1 0.3x0.2x0.1 N/A Area (cm) : 4.021 0.047 N/A Volume (cm) : 0.402 0.005 N/A % Reduction in Area: 32.60% 96.00% N/A % Reduction in  Volume: 32.70% 95.80% N/A Classification: Full Thickness Without Exposed Full Thickness Without Exposed N/A Support Structures Support Structures Exudate Amount: Medium Medium N/A Exudate Type: Serosanguineous Serosanguineous N/A Exudate Color: red, brown red, brown N/A Wound Margin: N/A Flat and Intact N/A Granulation Amount: Large (67-100%) Large (67-100%) N/A Granulation Quality: Red, Hyper-granulation Red, Hyper-granulation N/A Necrotic Amount: Small (1-33%) Small (1-33%) N/A Exposed Structures: Fat Layer (Subcutaneous Tissue): Fat Layer (Subcutaneous Tissue): N/A Yes Yes Fascia: No Fascia: No Tendon: No Tendon: No Muscle: No Muscle: No Joint: No Joint: No Bone: No Bone: No Epithelialization: None None N/A Treatment Notes Electronic Signature(s) Signed: 11/14/2020 1:57:02 PM By: Donnamarie Poag Entered By: Donnamarie Poag on 11/14/2020 09:06:35 Fee, Chaitra M.  ([id number]) -------------------------------------------------------------------------------- [place] Details Patient Name: Janet [name]. Date of Service: 11/14/2020 8:30 AM Medical Record Number: [id number] Patient Account Number: [id number] Date of Birth/Sex: 1950-04-01 (70 y.o. F) Treating RN: Donnamarie Poag Primary Care Jason Frisbee: Delight Stare Other Clinician: Referring Azalee Weimer: Delight Stare Treating Anaija Wissink/Extender: Skipper Cliche in Treatment: 11 Active Inactive Wound/Skin Impairment Nursing Diagnoses: Impaired tissue integrity Knowledge deficit related to smoking impact on wound healing Knowledge deficit related to ulceration/compromised skin integrity Goals: Patient/caregiver will verbalize understanding of skin care regimen Date Initiated: 08/29/2020 Date Inactivated: 09/20/2020 Target Resolution Date: 09/14/2020 Goal Status: Met Ulcer/skin breakdown will have a volume reduction of 30% by week 4 Date Initiated: 08/29/2020 Date Inactivated: 10/17/2020 Target Resolution Date: 09/29/2020 Goal Status: Met Ulcer/skin breakdown will have a volume reduction of 50% by week 8 Date Initiated: 08/29/2020 Date Inactivated: 11/14/2020 Target Resolution Date: 10/29/2020 Goal Status: Met Ulcer/skin breakdown will have a volume reduction of 80% by week 12 Date Initiated: 08/29/2020 Target Resolution Date: 11/29/2020 Goal Status: Active Ulcer/skin breakdown will heal within 14 weeks Date Initiated: 08/29/2020 Target Resolution Date: 12/14/2020 Goal Status: Active Interventions: Assess patient/caregiver ability to obtain necessary supplies Assess patient/caregiver ability to perform ulcer/skin care regimen upon admission and as needed Assess ulceration(s) every visit Provide education on ulcer and skin care Notes: Electronic Signature(s) Signed: 11/14/2020 1:57:02 PM By: Donnamarie Poag Entered By: Donnamarie Poag on 11/14/2020 08:50:39 Petrella, Abbe M.  ([id number]) -------------------------------------------------------------------------------- Pain Assessment Details Patient Name: Janet [name]. Date of Service: 11/14/2020 8:30 AM Medical Record Number: [id number] Patient Account Number: [id number] Date of Birth/Sex: Sep 12, 1950 (70 y.o. F) Treating RN: Donnamarie Poag Primary Care Serafino Burciaga: Delight Stare Other Clinician: Referring Terreon Ekholm: Delight Stare Treating Aleea Hendry/Extender: Skipper Cliche in Treatment: 11 Active Problems Location of Pain Severity and Description of Pain Patient Has Paino Yes Site Locations Pain Location: Pain in Ulcers Rate the pain. Current Pain Level: 7 Pain Management and Medication Current Pain Management: Electronic Signature(s) Signed: 11/14/2020 1:57:02 PM By: Donnamarie Poag Entered By: Donnamarie Poag on 11/14/2020 08:44:06 Siemen, Helyne M. ([id number]) -------------------------------------------------------------------------------- Patient/Caregiver Education Details Patient Name: Janet [name]. Date of Service: 11/14/2020 8:30 AM Medical Record Number: [id number] Patient Account Number: [id number] Date of Birth/Gender: 1950/04/11 (70 y.o. F) Treating RN: Donnamarie Poag Primary Care Physician: Delight Stare Other Clinician: Referring Physician: Delight Stare Treating Physician/Extender: Skipper Cliche in Treatment: 11 Education Assessment Education Provided To: Patient Education Topics Provided Wound/Skin Impairment: Electronic Signature(s) Signed: 11/14/2020 1:57:02 PM By: Donnamarie Poag Entered By: Donnamarie Poag on 11/14/2020 09:15:56 Sheckler, Alysia M. ([id number]) -------------------------------------------------------------------------------- Wound Assessment Details Patient Name: Janet [name]. Date of Service: 11/14/2020 8:30 AM Medical Record Number: [id number] Patient Account Number: [id number] Date of Birth/Sex: 26-[name]-1950 (70 y.o. F) Treating RN: Donnamarie Poag Primary Care Shaleena Crusoe: Delight Stare  Other  Clinician: Referring Candela Krul: Delight Stare Treating Nijah Tejera/Extender: Skipper Cliche in Treatment: 11 Wound Status Wound Number: 1 Primary Etiology: Venous Leg Ulcer Wound Location: Left, Distal, Anterior Lower Leg Secondary Etiology: Lymphedema Wounding Event: Blister Wound Status: Open Date Acquired: 05/15/2020 Comorbid History: Asthma, Hypertension Weeks Of Treatment: 11 Clustered Wound: No Photos Wound Measurements Length: (cm) 3.2 Width: (cm) 1.6 Depth: (cm) 0.1 Area: (cm) 4.021 Volume: (cm) 0.402 % Reduction in Area: 32.6% % Reduction in Volume: 32.7% Epithelialization: None Tunneling: No Undermining: No Wound Description Classification: Full Thickness Without Exposed Support Structu Exudate Amount: Medium Exudate Type: Serosanguineous Exudate Color: red, brown res Foul Odor After Cleansing: No Slough/Fibrino Yes Wound Bed Granulation Amount: Large (67-100%) Exposed Structure Granulation Quality: Red, Hyper-granulation Fascia Exposed: No Necrotic Amount: Small (1-33%) Fat Layer (Subcutaneous Tissue) Exposed: Yes Necrotic Quality: Adherent Slough Tendon Exposed: No Muscle Exposed: No Joint Exposed: No Bone Exposed: No Treatment Notes Wound #1 (Lower Leg) Wound Laterality: Left, Anterior, Distal Cleanser Peri-Wound Care Triamcinolone Acetonide Cream, 0.1%, 15 (g) tube Discharge Instruction: apply to WOUND and peri Donoso, Imane M. ([id number]) Topical Primary Dressing Hydrofera Blue Ready Transfer Foam, 2.5x2.5 (in/in) Discharge Instruction: Apply Hydrofera Blue Ready to wound bed as directed Secondary Dressing Secured With Compression Wrap Profore Lite LF 3 Multilayer Compression Bandaging System Discharge Instruction: Apply 3 multi-layer wrap as prescribed. Compression Stockings Add-Ons Electronic Signature(s) Signed: 11/14/2020 1:57:02 PM By: Donnamarie Poag Entered By: Donnamarie Poag on 11/14/2020 08:48:57 Stettner, Rhodia M.  ([id number]) -------------------------------------------------------------------------------- Wound Assessment Details Patient Name: Janet [name]. Date of Service: 11/14/2020 8:30 AM Medical Record Number: [id number] Patient Account Number: [id number] Date of Birth/Sex: 08/10/1950 (70 y.o. F) Treating RN: Donnamarie Poag Primary Care Sharmon Cheramie: Delight Stare Other Clinician: Referring Luke Rigsbee: Delight Stare Treating Aydin Hink/Extender: Skipper Cliche in Treatment: 11 Wound Status Wound Number: 2 Primary Etiology: Venous Leg Ulcer Wound Location: Left, Proximal, Anterior Lower Leg Secondary Etiology: Lymphedema Wounding Event: Blister Wound Status: Open Date Acquired: 05/15/2020 Comorbid History: Asthma, Hypertension Weeks Of Treatment: 11 Clustered Wound: No Photos Wound Measurements Length: (cm) 0.3 Width: (cm) 0.2 Depth: (cm) 0.1 Area: (cm) 0.047 Volume: (cm) 0.005 % Reduction in Area: 96% % Reduction in Volume: 95.8% Epithelialization: None Tunneling: No Undermining: No Wound Description Classification: Full Thickness Without Exposed Support Structu Wound Margin: Flat and Intact Exudate Amount: Medium Exudate Type: Serosanguineous Exudate Color: red, brown res Foul Odor After Cleansing: No Slough/Fibrino Yes Wound Bed Granulation Amount: Large (67-100%) Exposed Structure Granulation Quality: Red, Hyper-granulation Fascia Exposed: No Necrotic Amount: Small (1-33%) Fat Layer (Subcutaneous Tissue) Exposed: Yes Necrotic Quality: Adherent Slough Tendon Exposed: No Muscle Exposed: No Joint Exposed: No Bone Exposed: No Treatment Notes Wound #2 (Lower Leg) Wound Laterality: Left, Anterior, Proximal Cleanser Peri-Wound Care Topical Triamcinolone Acetonide Cream, 0.1%, 15 (g) tube Read, Aviv M. ([id number]) Discharge Instruction: apply to wound and peri-Apply as directed by Velda Wendt. Primary Dressing Hydrofera Blue Ready Transfer Foam, 2.5x2.5 (in/in) Discharge  Instruction: Apply Hydrofera Blue Ready to wound bed as directed Secondary Dressing Secured With Compression Wrap Profore Lite LF 3 Multilayer Compression Bandaging System Discharge Instruction: Apply 3 multi-layer wrap as prescribed. Compression Stockings Add-Ons Electronic Signature(s) Signed: 11/14/2020 1:57:02 PM By: Donnamarie Poag Entered By: Donnamarie Poag on 11/14/2020 08:49:37 Kritikos, Naysha M. ([id number]) -------------------------------------------------------------------------------- Vitals Details Patient Name: Janet [name]. Date of Service: 11/14/2020 8:30 AM Medical Record Number: [id number] Patient Account Number: [id number] Date of Birth/Sex: 13-Nov-1950 (70 y.o. F) Treating RN: Donnamarie Poag Primary Care Elonzo Sopp: Delight Stare Other Clinician: Referring Santrice Muzio: Lennox Grumbles,  Linda Treating Skyelyn Scruggs/Extender: Jeri Cos Weeks in Treatment: 11 Vital Signs Time Taken: 08:42 Temperature (F): 97.9 Height (in): 66 Pulse (bpm): 76 Weight (lbs): 192 Respiratory Rate (breaths/min): 16 Body Mass Index (BMI): 31 Blood Pressure (mmHg): 135/79 Reference Range: 80 - 120 mg / dl Electronic Signature(s) Signed: 11/14/2020 1:57:02 PM By: Donnamarie Poag Entered ByDonnamarie Poag on 11/14/2020 08:43:19

## 2020-11-14 NOTE — Progress Notes (Addendum)
LAKIYAH, ARNTSON ([id number]) Visit Report for 11/14/2020 Chief Complaint Document Details Patient Name: Janet [name], Janet [name]. Date of Service: 11/14/2020 8:30 AM Medical Record Number: [id number] Patient Account Number: [id number] Date of Birth/Sex: November 28, 1950 (70 y.o. F) Treating RN: Donnamarie Poag Primary Care Provider: Delight Stare Other Clinician: Referring Provider: Delight Stare Treating Provider/Extender: Skipper Cliche in Treatment: 11 Information Obtained from: Patient Chief Complaint Left LE Ulcers Electronic Signature(s) Signed: 11/14/2020 8:52:03 AM By: Worthy Keeler PA-C Entered By: Worthy Keeler on 11/14/2020 08:52:03 Janet [name], [name] ([id number]) -------------------------------------------------------------------------------- HPI Details Patient Name: Janet [name]. Date of Service: 11/14/2020 8:30 AM Medical Record Number: [id number] Patient Account Number: [id number] Date of Birth/Sex: 1950-11-27 (70 y.o. F) Treating RN: Donnamarie Poag Primary Care Provider: Delight Stare Other Clinician: Referring Provider: Delight Stare Treating Provider/Extender: Skipper Cliche in Treatment: 11 History of Present Illness HPI Description: 08/29/2020 upon evaluation today patient appears to be doing somewhat poorly in regard to what appeared to be venous leg ulcers on the left leg that she has been dealing with since around the beginning of May. Fortunately she does not have any signs of obvious infection unfortunately she does have quite a bit of necrotic tissue that does not really need to be debrided. She has been seeing vascular and they did wrap her for a time with what was likely an Haematologist. With that being said the [facility] boot has given her some trouble as far as feeling claustrophobic was concerned she is not sure if she is good to be able to tolerate a compression wrap currently although we will get a try she is in agreement with Aleve given this shot. Fortunately I do not see any signs  of active infection systemically which is great news and even locally she seems to be doing quite well today. Patient's Right ABI is 1.08 with a TBI of 0.79 and the left ABI is 1.15 with a TBI of 0.84 on 08/03/20. She also has a history of chronic venous insufficiency and lymphedema but otherwise no major medical problems. 09/05/2020 upon evaluation today patient unfortunately appears to be having some issues today with her right leg this is completely different from what we have been taking care of on the left. She actually has some swelling and an area of erythema and pain which is in the medial calf region. There is no identifiable abscess definitely this could just be cellulitis but with her history of DVT I think that we need to have this checked out. 09/12/2020 upon evaluation today patient appears to be doing better in regard to her wound. She has been tolerating the dressing changes without complication. Fortunately there does not appear to be any signs of active infection which is great news and overall I think that the patient is doing well in this regard. Her wounds are measuring smaller at both locations and the surface of the wound is looking better. With that being said she does have a wedding that she is supposed to be going to this weekend and states that she really needs to not have the wrap on for that. Nonetheless regular and discuss options to try to help maintain while she has this time with the wedding. 09/20/2020 upon evaluation today patient unfortunately is doing worse in regard to the pain associated with her wound at this time. Fortunately there does not appear to be any evidence of active infection systemically which is great news but in general I feel like locally she may  definitely be infected. Subsequently I do believe that we need to address this as soon as possible. 09/26/2020 upon evaluation today patient appears to be doing well with regard to her wound. Fortunately there  does not appear to be any signs of worsening in general. I think that overall the patient is making wonderful progress in my opinion. The right leg where she had an area of erythema medially also is better today which is great news. I think Levaquin has done a great job in that regard. We did see evidence still some erythema on the right leg but again this is significantly improved compared to last week. Overall the Levaquin has done a great job in this regard. 10/03/2020 upon evaluation today patient appears to be doing well all things considered with regard to her wounds. There does not appear to be any signs of infection obvious at this point which is great news. Fortunately I think that she is definitely making progress with regard to the appearance of the wound bed but nonetheless is still having quite a bit of an issue here with the pain aspect. Fortunately I think that we have some other options to consider him and use triamcinolone underneath the [facility] today. Also think looking into PuraPly/Apligraf as a treatment could be appropriate for this patient and my hope is that we will be able to get the wounds growing in a much faster pace as far as new tissue is concerned to get this closed sooner rather than later as that is the only way that we will get a get the pain completely resolved for her which is ultimately the goal we have as well obviously. 9/26; 2 wounds on the left anterior lower leg in the setting of what appears to be severe chronic venous insufficiency. We have been using Hydrofera Blue and 3 layer compression. 10/17/2020 upon evaluation today patient appears to be doing well with regard to her leg ulcer. She has been tolerating the dressing changes without complication. Fortunately there does not appear to be any signs of active infection at this time. No fevers, chills, nausea, vomiting, or diarrhea. 10/10; 2 wounds on the left lateral calf. She has been using Hydrofera  Blue. Still complains of pain. She is on her second week of that was started last week. 10/31/2020 upon evaluation today patient's wound bed actually showed signs of good granulation and epithelization at this point. Fortunately there does not appear to be any signs of active infection which is great news and overall very pleased with where we stand. I do think that the patient is making excellent progress based on what we are seeing I believe the triamcinolone along with a Hydrofera Blue is doing a great job. 11/07/2020 upon evaluation today patient appears to be doing well with regard to her wounds. This is going slowly but nonetheless I believe that the fact that she is responding to the triamcinolone and is doing better is a good sign I also believe this is a sign that were definitely dealing with more of an inflammatory process. Nonetheless I am extremely happy with where things stand today. 11/14/2020 upon evaluation today patient appears to be doing well with regard to her wounds. The more superior wound actually appears to be almost completely closed. The inferior area is measuring smaller but still open she still has a fairly significant amount of pain. Electronic Signature(s) Signed: 11/14/2020 9:09:01 AM By: Worthy Keeler PA-C Entered By: Worthy Keeler on 11/14/2020 09:09:01 Janet [name],  [name] ([id number]) -------------------------------------------------------------------------------- Physical Exam Details Patient Name: MARLYCE, MCDOUGALD. Date of Service: 11/14/2020 8:30 AM Medical Record Number: [id number] Patient Account Number: [id number] Date of Birth/Sex: 06/02/1950 (70 y.o. F) Treating RN: Donnamarie Poag Primary Care Provider: Delight Stare Other Clinician: Referring Provider: Delight Stare Treating Provider/Extender: Skipper Cliche in Treatment: 76 Constitutional Well-nourished and well-hydrated in no acute distress. Respiratory normal breathing without  difficulty. Psychiatric this patient is able to make decisions and demonstrates good insight into disease process. Alert and Oriented x 3. pleasant and cooperative. Notes Upon inspection patient's wound bed actually showed signs of good granulation epithelization at this point. Fortunately there does not appear to be any signs of active infection systemically which is great news. No fevers, chills, nausea, vomiting, or diarrhea. Electronic Signature(s) Signed: 11/14/2020 9:09:26 AM By: Worthy Keeler PA-C Entered By: Worthy Keeler on 11/14/2020 09:09:26 Janet [name], Janet [name] Kitchen ([id number]) -------------------------------------------------------------------------------- Physician Orders Details Patient Name: Janet [name]. Date of Service: 11/14/2020 8:30 AM Medical Record Number: [id number] Patient Account Number: [id number] Date of Birth/Sex: 1950/10/03 (71 y.o. F) Treating RN: Donnamarie Poag Primary Care Provider: Delight Stare Other Clinician: Referring Provider: Delight Stare Treating Provider/Extender: Skipper Cliche in Treatment: 11 Verbal / Phone Orders: No Diagnosis Coding ICD-10 Coding Code Description I87.2 Venous insufficiency (chronic) (peripheral) I89.0 Lymphedema, not elsewhere classified 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 of other part of left lower leg with fat layer exposed Follow-up Appointments o Return Appointment in 1 week. o Nurse Visit as needed Bathing/ Shower/ Hygiene o May shower with wound dressing protected with water repellent cover or cast protector. o No tub bath. Edema Control - Lymphedema / Segmental Compressive Device / Other o Optional: One layer of unna paste to top of compression wrap (to act as an anchor). - left leg o Patient to wear own compression stockings. Remove compression stockings every night before going to bed and put on every morning when getting up. - right leg-see Elastic Therapy contact and measurements o Patient to wear  own Velcro compression garment. Remove compression stockings every night before going to bed and put on every morning when getting up. - will be mailed to you, "JUXTELITE" o Elevate legs to the level of the heart and pump ankles as often as possible o Elevate leg(s) parallel to the floor when sitting. o DO YOUR BEST to sleep in the bed at night. DO NOT sleep in your recliner. Long hours of sitting in a recliner leads to swelling of the legs and/or potential wounds on your backside. Additional Orders / Instructions o Follow Nutritious Diet and Increase Protein Intake Medications-Please add to medication list. o Take one 500mg  Tylenol (Acetaminophen) and one 200mg  Motrin (Ibuprofen) every 6 hours for pain. Do not take ibuprofen if you are on blood thinners or have stomach ulcers. - as needed for pain if not contraindicated by other physician Wound Treatment Wound #1 - Lower Leg Wound Laterality: Left, Anterior, Distal Peri-Wound Care: Triamcinolone Acetonide Cream, 0.1%, 15 (g) tube 1 x Per Week/30 Days Discharge Instructions: apply to WOUND and peri Primary Dressing: Hydrofera Blue Ready Transfer Foam, 2.5x2.5 (in/in) 1 x Per Week/30 Days Discharge Instructions: Apply Hydrofera Blue Ready to wound bed as directed Compression Wrap: Profore Lite LF 3 Multilayer Compression Bandaging System 1 x Per Week/30 Days Discharge Instructions: Apply 3 multi-layer wrap as prescribed. Wound #2 - Lower Leg Wound Laterality: Left, Anterior, Proximal Topical: Triamcinolone Acetonide Cream, 0.1%, 15 (g) tube 1 x Per Week/30 Days Discharge Instructions: apply  to wound and peri-Apply as directed by provider. Primary Dressing: Hydrofera Blue Ready Transfer Foam, 2.5x2.5 (in/in) 1 x Per Week/30 Days Discharge Instructions: Apply Hydrofera Blue Ready to wound bed as directed Compression Wrap: Profore Lite LF 3 Multilayer Compression Bandaging System 1 x Per Week/30 Days Discharge Instructions: Apply 3  multi-layer wrap as prescribed. JEMIMAH, CRESSY ([id number]) Electronic Signature(s) Signed: 11/14/2020 1:57:02 PM By: Donnamarie Poag Signed: 11/14/2020 3:58:49 PM By: Worthy Keeler PA-C Entered By: Donnamarie Poag on 11/14/2020 09:08:02 Janet [name], [name] ([id number]) -------------------------------------------------------------------------------- Problem List Details Patient Name: Janet [name]. Date of Service: 11/14/2020 8:30 AM Medical Record Number: [id number] Patient Account Number: [id number] Date of Birth/Sex: November 03, 1950 (70 y.o. F) Treating RN: Donnamarie Poag Primary Care Provider: Delight Stare Other Clinician: Referring Provider: Delight Stare Treating Provider/Extender: Skipper Cliche in Treatment: 11 Active Problems ICD-10 Encounter Code Description Active Date MDM Diagnosis I87.2 Venous insufficiency (chronic) (peripheral) 08/29/2020 No Yes I89.0 Lymphedema, not elsewhere classified 08/29/2020 No Yes 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 of other part of left lower leg with fat layer 08/29/2020 No Yes exposed Inactive Problems ICD-10 Code Description Active Date Inactive Date L03.115 Cellulitis of right lower limb 09/05/2020 09/05/2020 R22.41 Localized swelling, mass and lump, right lower limb 09/05/2020 09/05/2020 Resolved Problems Electronic Signature(s) Signed: 11/14/2020 8:51:57 AM By: Worthy Keeler PA-C Entered By: Worthy Keeler on 11/14/2020 08:51:57 Janet [name], [name] ([id number]) -------------------------------------------------------------------------------- Progress Note Details Patient Name: Janet [name]. Date of Service: 11/14/2020 8:30 AM Medical Record Number: [id number] Patient Account Number: [id number] Date of Birth/Sex: Jul 06, 1950 (70 y.o. F) Treating RN: Donnamarie Poag Primary Care Provider: Delight Stare Other Clinician: Referring Provider: Delight Stare Treating Provider/Extender: Skipper Cliche in Treatment: 11 Subjective Chief Complaint Information obtained  from Patient Left LE Ulcers History of Present Illness (HPI) 08/29/2020 upon evaluation today patient appears to be doing somewhat poorly in regard to what appeared to be venous leg ulcers on the left leg that she has been dealing with since around the beginning of May. Fortunately she does not have any signs of obvious infection unfortunately she does have quite a bit of necrotic tissue that does not really need to be debrided. She has been seeing vascular and they did wrap her for a time with what was likely an Haematologist. With that being said the [facility] boot has given her some trouble as far as feeling claustrophobic was concerned she is not sure if she is good to be able to tolerate a compression wrap currently although we will get a try she is in agreement with Aleve given this shot. Fortunately I do not see any signs of active infection systemically which is great news and even locally she seems to be doing quite well today. Patient's Right ABI is 1.08 with a TBI of 0.79 and the left ABI is 1.15 with a TBI of 0.84 on 08/03/20. She also has a history of chronic venous insufficiency and lymphedema but otherwise no major medical problems. 09/05/2020 upon evaluation today patient unfortunately appears to be having some issues today with her right leg this is completely different from what we have been taking care of on the left. She actually has some swelling and an area of erythema and pain which is in the medial calf region. There is no identifiable abscess definitely this could just be cellulitis but with her history of DVT I think that we need to have this checked out. 09/12/2020 upon evaluation today patient appears to be doing better in  regard to her wound. She has been tolerating the dressing changes without complication. Fortunately there does not appear to be any signs of active infection which is great news and overall I think that the patient is doing well in this regard. Her wounds are  measuring smaller at both locations and the surface of the wound is looking better. With that being said she does have a wedding that she is supposed to be going to this weekend and states that she really needs to not have the wrap on for that. Nonetheless regular and discuss options to try to help maintain while she has this time with the wedding. 09/20/2020 upon evaluation today patient unfortunately is doing worse in regard to the pain associated with her wound at this time. Fortunately there does not appear to be any evidence of active infection systemically which is great news but in general I feel like locally she may definitely be infected. Subsequently I do believe that we need to address this as soon as possible. 09/26/2020 upon evaluation today patient appears to be doing well with regard to her wound. Fortunately there does not appear to be any signs of worsening in general. I think that overall the patient is making wonderful progress in my opinion. The right leg where she had an area of erythema medially also is better today which is great news. I think Levaquin has done a great job in that regard. We did see evidence still some erythema on the right leg but again this is significantly improved compared to last week. Overall the Levaquin has done a great job in this regard. 10/03/2020 upon evaluation today patient appears to be doing well all things considered with regard to her wounds. There does not appear to be any signs of infection obvious at this point which is great news. Fortunately I think that she is definitely making progress with regard to the appearance of the wound bed but nonetheless is still having quite a bit of an issue here with the pain aspect. Fortunately I think that we have some other options to consider him and use triamcinolone underneath the [facility] today. Also think looking into PuraPly/Apligraf as a treatment could be appropriate for this patient and my hope  is that we will be able to get the wounds growing in a much faster pace as far as new tissue is concerned to get this closed sooner rather than later as that is the only way that we will get a get the pain completely resolved for her which is ultimately the goal we have as well obviously. 9/26; 2 wounds on the left anterior lower leg in the setting of what appears to be severe chronic venous insufficiency. We have been using Hydrofera Blue and 3 layer compression. 10/17/2020 upon evaluation today patient appears to be doing well with regard to her leg ulcer. She has been tolerating the dressing changes without complication. Fortunately there does not appear to be any signs of active infection at this time. No fevers, chills, nausea, vomiting, or diarrhea. 10/10; 2 wounds on the left lateral calf. She has been using Hydrofera Blue. Still complains of pain. She is on her second week of that was started last week. 10/31/2020 upon evaluation today patient's wound bed actually showed signs of good granulation and epithelization at this point. Fortunately there does not appear to be any signs of active infection which is great news and overall very pleased with where we stand. I do think that the  patient is making excellent progress based on what we are seeing I believe the triamcinolone along with a Hydrofera Blue is doing a great job. 11/07/2020 upon evaluation today patient appears to be doing well with regard to her wounds. This is going slowly but nonetheless I believe that the fact that she is responding to the triamcinolone and is doing better is a good sign I also believe this is a sign that were definitely dealing with more of an inflammatory process. Nonetheless I am extremely happy with where things stand today. 11/14/2020 upon evaluation today patient appears to be doing well with regard to her wounds. The more superior wound actually appears to be almost completely closed. The inferior area is  measuring smaller but still open she still has a fairly significant amount of pain. Janet [name], [name] ([id number]) Objective Constitutional Well-nourished and well-hydrated in no acute distress. Vitals Time Taken: 8:42 AM, Height: 66 in, Weight: 192 lbs, BMI: 31, Temperature: 97.9 F, Pulse: 76 bpm, Respiratory Rate: 16 breaths/min, Blood Pressure: 135/79 mmHg. Respiratory normal breathing without difficulty. Psychiatric this patient is able to make decisions and demonstrates good insight into disease process. Alert and Oriented x 3. pleasant and cooperative. General Notes: Upon inspection patient's wound bed actually showed signs of good granulation epithelization at this point. Fortunately there does not appear to be any signs of active infection systemically which is great news. No fevers, chills, nausea, vomiting, or diarrhea. Integumentary (Hair, Skin) Wound #1 status is Open. Original cause of wound was Blister. The date acquired was: 05/15/2020. The wound has been in treatment 11 weeks. The wound is located on the [facility] Lower Leg. The wound measures 3.2cm length x 1.6cm width x 0.1cm depth; 4.021cm^2 area and 0.402cm^3 volume. There is Fat Layer (Subcutaneous Tissue) exposed. There is no tunneling or undermining noted. There is a medium amount of serosanguineous drainage noted. There is large (67-100%) red, hyper - granulation within the wound bed. There is a small (1-33%) amount of necrotic tissue within the wound bed including Adherent Slough. Wound #2 status is Open. Original cause of wound was Blister. The date acquired was: 05/15/2020. The wound has been in treatment 11 weeks. The wound is located on the Left,Proximal,Anterior Lower Leg. The wound measures 0.3cm length x 0.2cm width x 0.1cm depth; 0.047cm^2 area and 0.005cm^3 volume. There is Fat Layer (Subcutaneous Tissue) exposed. There is no tunneling or undermining noted. There is a medium amount of serosanguineous  drainage noted. The wound margin is flat and intact. There is large (67-100%) red, hyper - granulation within the wound bed. There is a small (1-33%) amount of necrotic tissue within the wound bed including Adherent Slough. Assessment Active Problems ICD-10 Venous insufficiency (chronic) (peripheral) Lymphedema, not elsewhere classified Non-pressure chronic ulcer of other part of left lower leg with fat layer exposed Procedures Wound #1 Pre-procedure diagnosis of Wound #1 is a Venous Leg Ulcer located on the Left,Distal,Anterior Lower Leg . There was a Three Layer Compression Therapy Procedure by Donnamarie Poag, RN. Post procedure Diagnosis Wound #1: Same as Pre-Procedure Wound #2 Pre-procedure diagnosis of Wound #2 is a Venous Leg Ulcer located on the Left,Proximal,Anterior Lower Leg . There was a Three Layer Compression Therapy Procedure by Donnamarie Poag, RN. Post procedure Diagnosis Wound #2: Same as Pre-Procedure Plan Janet [name], [name] ([id number]) Follow-up Appointments: Return Appointment in 1 week. Nurse Visit as needed Bathing/ Shower/ Hygiene: May shower with wound dressing protected with water repellent cover or cast protector. No tub bath. Edema Control -  Lymphedema / Segmental Compressive Device / Other: Optional: One layer of unna paste to top of compression wrap (to act as an anchor). - left leg Patient to wear own compression stockings. Remove compression stockings every night before going to bed and put on every morning when getting up. - right leg-see Elastic Therapy contact and measurements Patient to wear own Velcro compression garment. Remove compression stockings every night before going to bed and put on every morning when getting up. - will be mailed to you, "JUXTELITE" Elevate legs to the level of the heart and pump ankles as often as possible Elevate leg(s) parallel to the floor when sitting. DO YOUR BEST to sleep in the bed at night. DO NOT sleep in your recliner.  Long hours of sitting in a recliner leads to swelling of the legs and/or potential wounds on your backside. Additional Orders / Instructions: Follow Nutritious Diet and Increase Protein Intake Medications-Please add to medication list.: Take one 500mg  Tylenol (Acetaminophen) and one 200mg  Motrin (Ibuprofen) every 6 hours for pain. Do not take ibuprofen if you are on blood thinners or have stomach ulcers. - as needed for pain if not contraindicated by other physician WOUND #1: - Lower Leg Wound Laterality: Left, Anterior, Distal Peri-Wound Care: Triamcinolone Acetonide Cream, 0.1%, 15 (g) tube 1 x Per Week/30 Days Discharge Instructions: apply to WOUND and peri Primary Dressing: Hydrofera Blue Ready Transfer Foam, 2.5x2.5 (in/in) 1 x Per Week/30 Days Discharge Instructions: Apply Hydrofera Blue Ready to wound bed as directed Compression Wrap: Profore Lite LF 3 Multilayer Compression Bandaging System 1 x Per Week/30 Days Discharge Instructions: Apply 3 multi-layer wrap as prescribed. WOUND #2: - Lower Leg Wound Laterality: Left, Anterior, Proximal Topical: Triamcinolone Acetonide Cream, 0.1%, 15 (g) tube 1 x Per Week/30 Days Discharge Instructions: apply to wound and peri-Apply as directed by provider. Primary Dressing: Hydrofera Blue Ready Transfer Foam, 2.5x2.5 (in/in) 1 x Per Week/30 Days Discharge Instructions: Apply Hydrofera Blue Ready to wound bed as directed Compression Wrap: Profore Lite LF 3 Multilayer Compression Bandaging System 1 x Per Week/30 Days Discharge Instructions: Apply 3 multi-layer wrap as prescribed. 1. Would recommend currently that we going to continue with wound care measures as before and the patient is in agreement with the plan. 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 2. I am also can recommend that we have the patient continue with the triamcinolone topically followed by the [facility]. 3. We will continue with the 3 layer compression wrap. We will see patient back for reevaluation in 1 week here in the clinic. If anything worsens or changes patient will contact our office for additional recommendations. Electronic Signature(s) Signed: 11/14/2020 9:10:00 AM By: Worthy Keeler PA-C Entered By: Worthy Keeler on 11/14/2020 09:10:00 Janet [name], [name] ([id number]) -------------------------------------------------------------------------------- SuperBill Details Patient Name: Janet [name]. Date of Service: 11/14/2020 Medical Record Number: [id number] Patient Account Number: [id number] Date of Birth/Sex: Jun 10, 1950 (70 y.o. F) Treating RN: Donnamarie Poag Primary Care Provider: Delight Stare Other Clinician: Referring Provider: Delight Stare Treating Provider/Extender: Skipper Cliche in Treatment: 11 Diagnosis Coding ICD-10 Codes Code Description I87.2 Venous insufficiency (chronic) (peripheral) I89.0 Lymphedema, not elsewhere classified 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 of other part of left lower leg with fat layer exposed Facility Procedures  CPT4 Code: 02217981 Description: (Facility Use Only) [phone] - [facility] LT LEG Modifier: Quantity: 1 Physician Procedures CPT4 Code: 8241753 Description: 01040 - WC PHYS LEVEL 4 - EST PT Modifier: Quantity: 1 CPT4 Code: Description: ICD-10 Diagnosis Description I87.2 Venous insufficiency (chronic) (peripheral) I89.0 Lymphedema, not elsewhere classified 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 of other part of left lower leg with fat lay Modifier: er exposed Quantity: Electronic Signature(s) Signed: 11/14/2020 1:57:02 PM By: Donnamarie Poag Signed: 11/14/2020 3:58:49 PM By: Worthy Keeler PA-C Previous Signature: 11/14/2020 9:10:15 AM Version By: Worthy Keeler  PA-C Entered By: Donnamarie Poag on 11/14/2020 09:15:49

## 2020-11-21 ENCOUNTER — Encounter: Payer: Medicare Other | Attending: Physician Assistant | Admitting: Physician Assistant

## 2020-11-21 ENCOUNTER — Other Ambulatory Visit: Payer: Self-pay

## 2020-11-21 DIAGNOSIS — R2241 Localized swelling, mass and lump, right lower limb: Secondary | ICD-10-CM | POA: Insufficient documentation

## 2020-11-21 DIAGNOSIS — L97822 Non-pressure chronic ulcer of other part of left lower leg with fat layer exposed: Secondary | ICD-10-CM | POA: Diagnosis present

## 2020-11-21 DIAGNOSIS — L03115 Cellulitis of right lower limb: Secondary | ICD-10-CM | POA: Diagnosis not present

## 2020-11-21 DIAGNOSIS — I890 Lymphedema, not elsewhere classified: Secondary | ICD-10-CM | POA: Diagnosis not present

## 2020-11-21 DIAGNOSIS — I872 Venous insufficiency (chronic) (peripheral): Secondary | ICD-10-CM | POA: Diagnosis not present

## 2020-11-21 NOTE — Progress Notes (Addendum)
RAFAELLA, KOLE ([id number]) Visit Report for 11/21/2020 Chief Complaint Document Details Patient Name: Janet [name], Janet [name]. Date of Service: 11/21/2020 8:30 AM Medical Record Number: [id number] Patient Account Number: [id number] Date of Birth/Sex: 02-Aug-1950 (70 y.o. F) Treating RN: Donnamarie Poag Primary Care Provider: Delight Stare Other Clinician: Referring Provider: Delight Stare Treating Provider/Extender: Skipper Cliche in Treatment: 12 Information Obtained from: Patient Chief Complaint Left LE Ulcers Electronic Signature(s) Signed: 11/21/2020 8:51:44 AM By: Worthy Keeler PA-C Entered By: Worthy Keeler on 11/21/2020 08:51:44 Dittus, Stepfanie M. ([id number]) -------------------------------------------------------------------------------- HPI Details Patient Name: Janet [name]. Date of Service: 11/21/2020 8:30 AM Medical Record Number: [id number] Patient Account Number: [id number] Date of Birth/Sex: Nov 23, 1950 (70 y.o. F) Treating RN: Donnamarie Poag Primary Care Provider: Delight Stare Other Clinician: Referring Provider: Delight Stare Treating Provider/Extender: Skipper Cliche in Treatment: 12 History of Present Illness HPI Description: 08/29/2020 upon evaluation today patient appears to be doing somewhat poorly in regard to what appeared to be venous leg ulcers on the left leg that she has been dealing with since around the beginning of May. Fortunately she does not have any signs of obvious infection unfortunately she does have quite a bit of necrotic tissue that does not really need to be debrided. She has been seeing vascular and they did wrap her for a time with what was likely an Haematologist. With that being said the [facility] boot has given her some trouble as far as feeling claustrophobic was concerned she is not sure if she is good to be able to tolerate a compression wrap currently although we will get a try she is in agreement with Aleve given this shot. Fortunately I do not see any signs of  active infection systemically which is great news and even locally she seems to be doing quite well today. Patient's Right ABI is 1.08 with a TBI of 0.79 and the left ABI is 1.15 with a TBI of 0.84 on 08/03/20. She also has a history of chronic venous insufficiency and lymphedema but otherwise no major medical problems. 09/05/2020 upon evaluation today patient unfortunately appears to be having some issues today with her right leg this is completely different from what we have been taking care of on the left. She actually has some swelling and an area of erythema and pain which is in the medial calf region. There is no identifiable abscess definitely this could just be cellulitis but with her history of DVT I think that we need to have this checked out. 09/12/2020 upon evaluation today patient appears to be doing better in regard to her wound. She has been tolerating the dressing changes without complication. Fortunately there does not appear to be any signs of active infection which is great news and overall I think that the patient is doing well in this regard. Her wounds are measuring smaller at both locations and the surface of the wound is looking better. With that being said she does have a wedding that she is supposed to be going to this weekend and states that she really needs to not have the wrap on for that. Nonetheless regular and discuss options to try to help maintain while she has this time with the wedding. 09/20/2020 upon evaluation today patient unfortunately is doing worse in regard to the pain associated with her wound at this time. Fortunately there does not appear to be any evidence of active infection systemically which is great news but in general I feel like locally she may  definitely be infected. Subsequently I do believe that we need to address this as soon as possible. 09/26/2020 upon evaluation today patient appears to be doing well with regard to her wound. Fortunately there  does not appear to be any signs of worsening in general. I think that overall the patient is making wonderful progress in my opinion. The right leg where she had an area of erythema medially also is better today which is great news. I think Levaquin has done a great job in that regard. We did see evidence still some erythema on the right leg but again this is significantly improved compared to last week. Overall the Levaquin has done a great job in this regard. 10/03/2020 upon evaluation today patient appears to be doing well all things considered with regard to her wounds. There does not appear to be any signs of infection obvious at this point which is great news. Fortunately I think that she is definitely making progress with regard to the appearance of the wound bed but nonetheless is still having quite a bit of an issue here with the pain aspect. Fortunately I think that we have some other options to consider him and use triamcinolone underneath the [facility] today. Also think looking into PuraPly/Apligraf as a treatment could be appropriate for this patient and my hope is that we will be able to get the wounds growing in a much faster pace as far as new tissue is concerned to get this closed sooner rather than later as that is the only way that we will get a get the pain completely resolved for her which is ultimately the goal we have as well obviously. 9/26; 2 wounds on the left anterior lower leg in the setting of what appears to be severe chronic venous insufficiency. We have been using Hydrofera Blue and 3 layer compression. 10/17/2020 upon evaluation today patient appears to be doing well with regard to her leg ulcer. She has been tolerating the dressing changes without complication. Fortunately there does not appear to be any signs of active infection at this time. No fevers, chills, nausea, vomiting, or diarrhea. 10/10; 2 wounds on the left lateral calf. She has been using Hydrofera  Blue. Still complains of pain. She is on her second week of that was started last week. 10/31/2020 upon evaluation today patient's wound bed actually showed signs of good granulation and epithelization at this point. Fortunately there does not appear to be any signs of active infection which is great news and overall very pleased with where we stand. I do think that the patient is making excellent progress based on what we are seeing I believe the triamcinolone along with a Hydrofera Blue is doing a great job. 11/07/2020 upon evaluation today patient appears to be doing well with regard to her wounds. This is going slowly but nonetheless I believe that the fact that she is responding to the triamcinolone and is doing better is a good sign I also believe this is a sign that were definitely dealing with more of an inflammatory process. Nonetheless I am extremely happy with where things stand today. 11/14/2020 upon evaluation today patient appears to be doing well with regard to her wounds. The more superior wound actually appears to be almost completely closed. The inferior area is measuring smaller but still open she still has a fairly significant amount of pain. 11/21/2020 upon inspection patient's wound bed actually showed signs of good granulation epithelization at this point. Fortunately there does not  appear to be any evidence of active infection systemically which is great news and overall I am actually extremely pleased with where we stand. I do believe that the patient is making good progress in fact the wound seem to be showing signs of shrinking week by week and the progress is fairly dramatic. The proximal wound does appear to likely be healed today. Electronic Signature(s) NALLELI, LARGENT ([id number]) Signed: 11/21/2020 9:10:26 AM By: Worthy Keeler PA-C Entered By: Worthy Keeler on 11/21/2020 09:10:26 Malia, Jerilyn M.  ([id number]) -------------------------------------------------------------------------------- Physical Exam Details Patient Name: Janet [name]. Date of Service: 11/21/2020 8:30 AM Medical Record Number: [id number] Patient Account Number: [id number] Date of Birth/Sex: Apr 07, 1950 (70 y.o. F) Treating RN: Donnamarie Poag Primary Care Provider: Delight Stare Other Clinician: Referring Provider: Delight Stare Treating Provider/Extender: Skipper Cliche in Treatment: 46 Constitutional Well-nourished and well-hydrated in no acute distress. Respiratory normal breathing without difficulty. Psychiatric this patient is able to make decisions and demonstrates good insight into disease process. Alert and Oriented x 3. pleasant and cooperative. Notes Patient's wound bed showed signs of good granulation epithelization and again does appear to be healed in regard to the wound proximally. I think that the distal wound is still open but again this is significantly smaller. I am very pleased and I am happy with the plan so far we can continue currently. Electronic Signature(s) Signed: 11/21/2020 9:10:47 AM By: Worthy Keeler PA-C Entered By: Worthy Keeler on 11/21/2020 09:10:47 Way, Shaila M. ([id number]) -------------------------------------------------------------------------------- Physician Orders Details Patient Name: Janet [name]. Date of Service: 11/21/2020 8:30 AM Medical Record Number: [id number] Patient Account Number: [id number] Date of Birth/Sex: Oct 14, 1950 (70 y.o. F) Treating RN: Donnamarie Poag Primary Care Provider: Delight Stare Other Clinician: Referring Provider: Delight Stare Treating Provider/Extender: Skipper Cliche in Treatment: 12 Verbal / Phone Orders: No Diagnosis Coding ICD-10 Coding Code Description I87.2 Venous insufficiency (chronic) (peripheral) I89.0 Lymphedema, not elsewhere classified 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 of other part of left lower leg with fat layer  exposed Follow-up Appointments o Return Appointment in 1 week. o Nurse Visit as needed Bathing/ Shower/ Hygiene o May shower with wound dressing protected with water repellent cover or cast protector. o No tub bath. Edema Control - Lymphedema / Segmental Compressive Device / Other o Optional: One layer of unna paste to top of compression wrap (to act as an anchor). - left leg o Patient to wear own compression stockings. Remove compression stockings every night before going to bed and put on every morning when getting up. - right leg-see Elastic Therapy contact and measurements o Patient to wear own Velcro compression garment. Remove compression stockings every night before going to bed and put on every morning when getting up. - will be mailed to you, "JUXTELITE" o Elevate legs to the level of the heart and pump ankles as often as possible o Elevate leg(s) parallel to the floor when sitting. o DO YOUR BEST to sleep in the bed at night. DO NOT sleep in your recliner. Long hours of sitting in a recliner leads to swelling of the legs and/or potential wounds on your backside. Medications-Please add to medication list. o Take one 500mg  Tylenol (Acetaminophen) and one 200mg  Motrin (Ibuprofen) every 6 hours for pain. Do not take ibuprofen if you are on blood thinners or have stomach ulcers. - as needed for pain if not contraindicated by other physician Wound Treatment Wound #1 - Lower Leg Wound Laterality: Left, Anterior, Distal Peri-Wound Care: Triamcinolone Acetonide  Cream, 0.1%, 15 (g) tube 1 x Per Week/30 Days Discharge Instructions: apply to WOUND and peri Primary Dressing: Hydrofera Blue Ready Transfer Foam, 2.5x2.5 (in/in) 1 x Per Week/30 Days Discharge Instructions: Apply Hydrofera Blue Ready to wound bed as directed Compression Wrap: Profore Lite LF 3 Multilayer Compression Bandaging System 1 x Per Week/30 Days Discharge Instructions: Apply 3 multi-layer wrap as  prescribed. Wound #2 - Lower Leg Wound Laterality: Left, Anterior, Proximal Topical: Triamcinolone Acetonide Cream, 0.1%, 15 (g) tube 1 x Per Week/30 Days Discharge Instructions: apply to wound and peri-Apply as directed by provider. Primary Dressing: Hydrofera Blue Ready Transfer Foam, 2.5x2.5 (in/in) 1 x Per Week/30 Days Discharge Instructions: Apply Hydrofera Blue Ready to wound bed as directed Compression Wrap: Profore Lite LF 3 Multilayer Compression Bandaging System 1 x Per Week/30 Days Discharge Instructions: Apply 3 multi-layer wrap as prescribed. Electronic Signature(s) Signed: 11/21/2020 9:53:33 AM By: Berneta Sages, Celica M. ([id number]) Signed: 11/21/2020 4:18:05 PM By: Worthy Keeler PA-C Entered By: Donnamarie Poag on 11/21/2020 08:58:18 Axley, Ashey M. ([id number]) -------------------------------------------------------------------------------- Problem List Details Patient Name: Janet [name]. Date of Service: 11/21/2020 8:30 AM Medical Record Number: [id number] Patient Account Number: [id number] Date of Birth/Sex: [birth year] (70 y.o. F) Treating RN: Donnamarie Poag Primary Care Provider: Delight Stare Other Clinician: Referring Provider: Delight Stare Treating Provider/Extender: Skipper Cliche in Treatment: 12 Active Problems ICD-10 Encounter Code Description Active Date MDM Diagnosis I87.2 Venous insufficiency (chronic) (peripheral) 08/29/2020 No Yes I89.0 Lymphedema, not elsewhere classified 08/29/2020 No Yes 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 of other part of left lower leg with fat layer 08/29/2020 No Yes exposed Inactive Problems ICD-10 Code Description Active Date Inactive Date L03.115 Cellulitis of right lower limb 09/05/2020 09/05/2020 R22.41 Localized swelling, mass and lump, right lower limb 09/05/2020 09/05/2020 Resolved Problems Electronic Signature(s) Signed: 11/21/2020 8:51:30 AM By: Worthy Keeler PA-C Entered By: Worthy Keeler on 11/21/2020 08:51:30 Nawrot,  Maple M. ([id number]) -------------------------------------------------------------------------------- Progress Note Details Patient Name: Janet [name]. Date of Service: 11/21/2020 8:30 AM Medical Record Number: [id number] Patient Account Number: [id number] Date of Birth/Sex: 08/24/1950 (70 y.o. F) Treating RN: Donnamarie Poag Primary Care Provider: Delight Stare Other Clinician: Referring Provider: Delight Stare Treating Provider/Extender: Skipper Cliche in Treatment: 12 Subjective Chief Complaint Information obtained from Patient Left LE Ulcers History of Present Illness (HPI) 08/29/2020 upon evaluation today patient appears to be doing somewhat poorly in regard to what appeared to be venous leg ulcers on the left leg that she has been dealing with since around the beginning of May. Fortunately she does not have any signs of obvious infection unfortunately she does have quite a bit of necrotic tissue that does not really need to be debrided. She has been seeing vascular and they did wrap her for a time with what was likely an Haematologist. With that being said the [facility] boot has given her some trouble as far as feeling claustrophobic was concerned she is not sure if she is good to be able to tolerate a compression wrap currently although we will get a try she is in agreement with Aleve given this shot. Fortunately I do not see any signs of active infection systemically which is great news and even locally she seems to be doing quite well today. Patient's Right ABI is 1.08 with a TBI of 0.79 and the left ABI is 1.15 with a TBI of 0.84 on 08/03/20. She also has a history of chronic venous insufficiency and lymphedema but otherwise no major  medical problems. 09/05/2020 upon evaluation today patient unfortunately appears to be having some issues today with her right leg this is completely different from what we have been taking care of on the left. She actually has some swelling and an area of erythema and  pain which is in the medial calf region. There is no identifiable abscess definitely this could just be cellulitis but with her history of DVT I think that we need to have this checked out. 09/12/2020 upon evaluation today patient appears to be doing better in regard to her wound. She has been tolerating the dressing changes without complication. Fortunately there does not appear to be any signs of active infection which is great news and overall I think that the patient is doing well in this regard. Her wounds are measuring smaller at both locations and the surface of the wound is looking better. With that being said she does have a wedding that she is supposed to be going to this weekend and states that she really needs to not have the wrap on for that. Nonetheless regular and discuss options to try to help maintain while she has this time with the wedding. 09/20/2020 upon evaluation today patient unfortunately is doing worse in regard to the pain associated with her wound at this time. Fortunately there does not appear to be any evidence of active infection systemically which is great news but in general I feel like locally she may definitely be infected. Subsequently I do believe that we need to address this as soon as possible. 09/26/2020 upon evaluation today patient appears to be doing well with regard to her wound. Fortunately there does not appear to be any signs of worsening in general. I think that overall the patient is making wonderful progress in my opinion. The right leg where she had an area of erythema medially also is better today which is great news. I think Levaquin has done a great job in that regard. We did see evidence still some erythema on the right leg but again this is significantly improved compared to last week. Overall the Levaquin has done a great job in this regard. 10/03/2020 upon evaluation today patient appears to be doing well all things considered with regard to her  wounds. There does not appear to be any signs of infection obvious at this point which is great news. Fortunately I think that she is definitely making progress with regard to the appearance of the wound bed but nonetheless is still having quite a bit of an issue here with the pain aspect. Fortunately I think that we have some other options to consider him and use triamcinolone underneath the [facility] today. Also think looking into PuraPly/Apligraf as a treatment could be appropriate for this patient and my hope is that we will be able to get the wounds growing in a much faster pace as far as new tissue is concerned to get this closed sooner rather than later as that is the only way that we will get a get the pain completely resolved for her which is ultimately the goal we have as well obviously. 9/26; 2 wounds on the left anterior lower leg in the setting of what appears to be severe chronic venous insufficiency. We have been using Hydrofera Blue and 3 layer compression. 10/17/2020 upon evaluation today patient appears to be doing well with regard to her leg ulcer. She has been tolerating the dressing changes without complication. Fortunately there does not appear to be any  signs of active infection at this time. No fevers, chills, nausea, vomiting, or diarrhea. 10/10; 2 wounds on the left lateral calf. She has been using Hydrofera Blue. Still complains of pain. She is on her second week of that was started last week. 10/31/2020 upon evaluation today patient's wound bed actually showed signs of good granulation and epithelization at this point. Fortunately there does not appear to be any signs of active infection which is great news and overall very pleased with where we stand. I do think that the patient is making excellent progress based on what we are seeing I believe the triamcinolone along with a Hydrofera Blue is doing a great job. 11/07/2020 upon evaluation today patient appears to be  doing well with regard to her wounds. This is going slowly but nonetheless I believe that the fact that she is responding to the triamcinolone and is doing better is a good sign I also believe this is a sign that were definitely dealing with more of an inflammatory process. Nonetheless I am extremely happy with where things stand today. 11/14/2020 upon evaluation today patient appears to be doing well with regard to her wounds. The more superior wound actually appears to be almost completely closed. The inferior area is measuring smaller but still open she still has a fairly significant amount of pain. 11/21/2020 upon inspection patient's wound bed actually showed signs of good granulation epithelization at this point. Fortunately there does not appear to be any evidence of active infection systemically which is great news and overall I am actually extremely pleased with where we stand. I do believe that the patient is making good progress in fact the wound seem to be showing signs of shrinking week by week and the progress is Oldenkamp, Morgane M. ([id number]) fairly dramatic. The proximal wound does appear to likely be healed today. Objective Constitutional Well-nourished and well-hydrated in no acute distress. Vitals Time Taken: 8:42 AM, Height: 66 in, Weight: 192 lbs, BMI: 31, Temperature: 98.2 F, Pulse: 84 bpm, Respiratory Rate: 16 breaths/min, Blood Pressure: 157/76 mmHg. Respiratory normal breathing without difficulty. Psychiatric this patient is able to make decisions and demonstrates good insight into disease process. Alert and Oriented x 3. pleasant and cooperative. General Notes: Patient's wound bed showed signs of good granulation epithelization and again does appear to be healed in regard to the wound proximally. I think that the distal wound is still open but again this is significantly smaller. I am very pleased and I am happy with the plan so far we can continue  currently. Integumentary (Hair, Skin) Wound #1 status is Open. Original cause of wound was Blister. The date acquired was: 05/15/2020. The wound has been in treatment 12 weeks. The wound is located on the [facility] Lower Leg. The wound measures 3.2cm length x 1.2cm width x 0.1cm depth; 3.016cm^2 area and 0.302cm^3 volume. There is Fat Layer (Subcutaneous Tissue) exposed. There is no tunneling or undermining noted. There is a medium amount of serosanguineous drainage noted. There is large (67-100%) red, hyper - granulation within the wound bed. There is a small (1-33%) amount of necrotic tissue within the wound bed including Adherent Slough. Wound #2 status is Healed - Epithelialized. Original cause of wound was Blister. The date acquired was: 05/15/2020. The wound has been in treatment 12 weeks. The wound is located on the Left,Proximal,Anterior Lower Leg. The wound measures 0cm length x 0cm width x 0cm depth; 0cm^2 area and 0cm^3 volume. There is no tunneling or undermining noted. There  is a none present amount of drainage noted. The wound margin is flat and intact. There is no granulation within the wound bed. There is no necrotic tissue within the wound bed. Assessment Active Problems ICD-10 Venous insufficiency (chronic) (peripheral) Lymphedema, not elsewhere classified Non-pressure chronic ulcer of other part of left lower leg with fat layer exposed Procedures Wound #1 Pre-procedure diagnosis of Wound #1 is a Venous Leg Ulcer located on the Left,Distal,Anterior Lower Leg . There was a Three Layer Compression Therapy Procedure by Donnamarie Poag, RN. Post procedure Diagnosis Wound #1: Same as Pre-Procedure Plan Follow-up Appointments: KEONTA, MONCEAUX ([id number]) Return Appointment in 1 week. Nurse Visit as needed Bathing/ Shower/ Hygiene: May shower with wound dressing protected with water repellent cover or cast protector. No tub bath. Edema Control - Lymphedema / Segmental  Compressive Device / Other: Optional: One layer of unna paste to top of compression wrap (to act as an anchor). - left leg Patient to wear own compression stockings. Remove compression stockings every night before going to bed and put on every morning when getting up. - right leg-see Elastic Therapy contact and measurements Patient to wear own Velcro compression garment. Remove compression stockings every night before going to bed and put on every morning when getting up. - will be mailed to you, "JUXTELITE" Elevate legs to the level of the heart and pump ankles as often as possible Elevate leg(s) parallel to the floor when sitting. DO YOUR BEST to sleep in the bed at night. DO NOT sleep in your recliner. Long hours of sitting in a recliner leads to swelling of the legs and/or potential wounds on your backside. Medications-Please add to medication list.: Take one 500mg  Tylenol (Acetaminophen) and one 200mg  Motrin (Ibuprofen) every 6 hours for pain. Do not take ibuprofen if you are on blood thinners or have stomach ulcers. - as needed for pain if not contraindicated by other physician WOUND #1: - Lower Leg Wound Laterality: Left, Anterior, Distal Peri-Wound Care: Triamcinolone Acetonide Cream, 0.1%, 15 (g) tube 1 x Per Week/30 Days Discharge Instructions: apply to WOUND and peri Primary Dressing: Hydrofera Blue Ready Transfer Foam, 2.5x2.5 (in/in) 1 x Per Week/30 Days Discharge Instructions: Apply Hydrofera Blue Ready to wound bed as directed Compression Wrap: Profore Lite LF 3 Multilayer Compression Bandaging System 1 x Per Week/30 Days Discharge Instructions: Apply 3 multi-layer wrap as prescribed. WOUND #2: - Lower Leg Wound Laterality: Left, Anterior, Proximal Topical: Triamcinolone Acetonide Cream, 0.1%, 15 (g) tube 1 x Per Week/30 Days Discharge Instructions: apply to wound and peri-Apply as directed by provider. Primary Dressing: Hydrofera Blue Ready Transfer Foam, 2.5x2.5 (in/in) 1 x  Per Week/30 Days Discharge Instructions: Apply Hydrofera Blue Ready to wound bed as directed Compression Wrap: Profore Lite LF 3 Multilayer Compression Bandaging System 1 x Per Week/30 Days Discharge Instructions: Apply 3 multi-layer wrap as prescribed. 1. Would recommend currently that we going to continue with the wound care measures as before and the patient is in agreement with plan this includes the use of the [facility] dressing were using some of the triamcinolone underneath. 2. I am also can recommend we continue with the 3 layer compression wrap which I do feel like is doing a good job for her as well. We will see patient back for reevaluation in 1 week here in the clinic. If anything worsens or changes patient will contact our office for additional recommendations. Electronic Signature(s) Signed: 11/21/2020 9:11:09 AM By: Worthy Keeler PA-C Entered By: Melburn Hake,  Jericka Kadar on 11/21/2020 09:11:09 Irene, [place] ([id number]) -------------------------------------------------------------------------------- SuperBill Details Patient Name: DEZI, BRAUNER. Date of Service: 11/21/2020 Medical Record Number: [id number] Patient Account Number: [id number] Date of Birth/Sex: May 24, 1950 (70 y.o. F) Treating RN: Donnamarie Poag Primary Care Provider: Delight Stare Other Clinician: Referring Provider: Delight Stare Treating Provider/Extender: Skipper Cliche in Treatment: 12 Diagnosis Coding ICD-10 Codes Code Description I87.2 Venous insufficiency (chronic) (peripheral) I89.0 Lymphedema, not elsewhere classified 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 of other part of left lower leg with fat layer exposed Facility Procedures CPT4 Code: 37096438 Description: (Facility Use Only) [phone] - [facility] L[facility] LT LEG Modifier: Quantity: 1 Physician Procedures CPT4 Code: 7543606 Description: 77034 - WC PHYS LEVEL 4 - EST PT Modifier: Quantity: 1 CPT4 Code: Description: ICD-10 Diagnosis  Description I87.2 Venous insufficiency (chronic) (peripheral) I89.0 Lymphedema, not elsewhere classified 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 of other part of left lower leg with fat lay Modifier: er exposed Quantity: Electronic Signature(s) Signed: 11/21/2020 9:11:24 AM By: Worthy Keeler PA-C Entered By: Worthy Keeler on 11/21/2020 09:11:24

## 2020-11-21 NOTE — Progress Notes (Signed)
CLATIE, KESSEN ([id number]) Visit Report for 11/21/2020 Arrival Information Details Patient Name: Janet [name], Janet [name]. Date of Service: 11/21/2020 8:30 AM Medical Record Number: [id number] Patient Account Number: [id number] Date of Birth/Sex: September 30, 1950 (70 y.o. F) Treating RN: Donnamarie Poag Primary Care Wrenn Willcox: Delight Stare Other Clinician: Referring Taksh Hjort: Delight Stare Treating Timothea Bodenheimer/Extender: Skipper Cliche in Treatment: 12 Visit Information History Since Last Visit Added or deleted any medications: No Patient Arrived: Ambulatory Had a fall or experienced change in No Arrival Time: 08:38 activities of daily living that may affect Accompanied By: self risk of falls: Transfer Assistance: None Hospitalized since last visit: No Patient Identification Verified: Yes Has Dressing in Place as Prescribed: Yes Secondary Verification Process Completed: Yes Has Compression in Place as Prescribed: Yes Patient Requires Transmission-Based No Pain Present Now: Yes Precautions: Patient Has Alerts: Yes Patient Alerts: Patient on Blood Thinner Aspirin 21m AVVS ABI/TBI 7/22 ABI L- 1.15/ R- 1.08 Electronic Signature(s) Signed: 11/21/2020 9:53:33 AM By: BDonnamarie PoagEntered By: BDonnamarie Poagon 11/21/2020 08:40:24 [place] ([id number] -------------------------------------------------------------------------------- Clinic Level of Care Assessment Details Patient Name: Janet [name] Date of Service: 11/21/2020 8:30 AM Medical Record Number: [id number] Account Number: [id number] of Birth/Sex: [birth year](70y.o. F) Treating RN: BDonnamarie PoagPrimary Care Lalita Ebel: MDelight StareOther Clinician: Referring Lillianna Sabel: MDelight StareTreating Liviah Cake/Extender: SSkipper Clichein Treatment: 12 Clinic Level of Care Assessment Items TOOL 1 Quantity Score '[]'  - Use when EandM and Procedure is performed on INITIAL visit 0 ASSESSMENTS - Nursing Assessment / Reassessment '[]'  - General  Physical Exam (combine w/ comprehensive assessment (listed just below) when performed on new 0 pt. evals) '[]'  - 0 Comprehensive Assessment (HX, ROS, Risk Assessments, Wounds Hx, etc.) ASSESSMENTS - Wound and Skin Assessment / Reassessment '[]'  - Dermatologic / Skin Assessment (not related to wound area) 0 ASSESSMENTS - Ostomy and/or Continence Assessment and Care '[]'  - Incontinence Assessment and Management 0 '[]'  - 0 Ostomy Care Assessment and Management (repouching, etc.) PROCESS - Coordination of Care '[]'  - Simple Patient / Family Education for ongoing care 0 '[]'  - 0 Complex (extensive) Patient / Family Education for ongoing care '[]'  - 0 Staff obtains CProgrammer, systems Records, Test Results / Process Orders '[]'  - 0 Staff telephones HHA, Nursing Homes / Clarify orders / etc '[]'  - 0 Routine Transfer to another Facility (non-emergent condition) '[]'  - 0 Routine Hospital Admission (non-emergent condition) '[]'  - 0 New Admissions / IBiomedical engineer/ Ordering NPWT, Apligraf, etc. '[]'  - 0 Emergency Hospital Admission (emergent condition) PROCESS - Special Needs '[]'  - Pediatric / Minor Patient Management 0 '[]'  - 0 Isolation Patient Management '[]'  - 0 Hearing / Language / Visual special needs '[]'  - 0 Assessment of Community assistance (transportation, D/C planning, etc.) '[]'  - 0 Additional assistance / Altered mentation '[]'  - 0 Support Surface(s) Assessment (bed, cushion, seat, etc.) INTERVENTIONS - Miscellaneous '[]'  - External ear exam 0 '[]'  - 0 Patient Transfer (multiple staff / HCivil Service fast streamer/ Similar devices) '[]'  - 0 Simple Staple / Suture removal (25 or less) '[]'  - 0 Complex Staple / Suture removal (26 or more) '[]'  - 0 Hypo/Hyperglycemic Management (do not check if billed separately) '[]'  - 0 Ankle / Brachial Index (ABI) - do not check if billed separately Has the patient been seen at the hospital within the last three years: Yes Total Score: 0 Level Of Care: ____ Janet [name] ([id number] Electronic Signature(s) Signed: 11/21/2020 9:53:33 AM By: BDonnamarie PoagEntered By: BDonnamarie Poagon  11/21/2020 08:59:12 Nunziata, Zahriah M. ([id number]) -------------------------------------------------------------------------------- Compression Therapy Details Patient Name: Janet [name]. Date of Service: 11/21/2020 8:30 AM Medical Record Number: [id number] Patient Account Number: [id number] Date of Birth/Sex: September 30, 1950 (70 y.o. F) Treating RN: Donnamarie Poag Primary Care Krithi Bray: Delight Stare Other Clinician: Referring Siarah Deleo: Delight Stare Treating Edvardo Honse/Extender: Skipper Cliche in Treatment: 12 Compression Therapy Performed for Wound Assessment: Wound #1 Left,Distal,Anterior Lower Leg Performed By: Junius Argyle, RN Compression Type: Three Layer Post Procedure Diagnosis Same as Pre-procedure Electronic Signature(s) Signed: 11/21/2020 9:53:33 AM By: Donnamarie Poag Entered By: Donnamarie Poag on 11/21/2020 08:57:02 [place], Geraldine M. ([id number]) -------------------------------------------------------------------------------- Encounter Discharge Information Details Patient Name: Janet [name]. Date of Service: 11/21/2020 8:30 AM Medical Record Number: [id number] Patient Account Number: [id number] Date of Birth/Sex: 1950-11-29 (70 y.o. F) Treating RN: Donnamarie Poag Primary Care Earsel Shouse: Delight Stare Other Clinician: Referring Taquita Demby: Delight Stare Treating Alfonsa Vaile/Extender: Skipper Cliche in Treatment: 12 Encounter Discharge Information Items Discharge Condition: Stable Ambulatory Status: Ambulatory Discharge Destination: Home Transportation: Private Auto Accompanied By: self Schedule Follow-up Appointment: Yes Clinical Summary of Care: Electronic Signature(s) Signed: 11/21/2020 9:53:33 AM By: Donnamarie Poag Entered By: Donnamarie Poag on 11/21/2020 09:06:58 Olliff, Lakelyn M. ([id number]) -------------------------------------------------------------------------------- Lower  Extremity Assessment Details Patient Name: Janet [name]. Date of Service: 11/21/2020 8:30 AM Medical Record Number: [id number] Patient Account Number: [id number] Date of Birth/Sex: [birth year] (70 y.o. F) Treating RN: Donnamarie Poag Primary Care Zalman Hull: Delight Stare Other Clinician: Referring Irma Delancey: Delight Stare Treating Contessa Preuss/Extender: Skipper Cliche in Treatment: 12 Edema Assessment Assessed: [Left: No] [Right: No] [Left: Edema] [Right: :] Calf Left: Right: Point of Measurement: 29 cm From Medial Instep 39 cm Ankle Left: Right: Point of Measurement: 10 cm From Medial Instep 24 cm Knee To Floor Left: Right: From Medial Instep 43 cm Vascular Assessment Pulses: Dorsalis Pedis Palpable: [Left:Yes] Electronic Signature(s) Signed: 11/21/2020 9:53:33 AM By: Donnamarie Poag Entered ByDonnamarie Poag on 11/21/2020 08:49:22 Cancel, Ethelwyn M. ([id number]) -------------------------------------------------------------------------------- Multi Wound Chart Details Patient Name: Janet [name]. Date of Service: 11/21/2020 8:30 AM Medical Record Number: [id number] Patient Account Number: [id number] Date of Birth/Sex: Jul 21, 1950 (70 y.o. F) Treating RN: Donnamarie Poag Primary Care Yessica Putnam: Delight Stare Other Clinician: Referring Coty Student: Delight Stare Treating Megin Consalvo/Extender: Skipper Cliche in Treatment: 12 Vital Signs Height(in): 66 Pulse(bpm): 54 Weight(lbs): 192 Blood Pressure(mmHg): 157/76 Body Mass Index(BMI): 31 Temperature(F): 98.2 Respiratory Rate(breaths/min): 16 Photos: [N/A:N/A] Wound Location: Left, Distal, Anterior Lower Leg Left, Proximal, Anterior Lower Leg N/A Wounding Event: Blister Blister N/A Primary Etiology: Venous Leg Ulcer Venous Leg Ulcer N/A Secondary Etiology: Lymphedema Lymphedema N/A Comorbid History: Asthma, Hypertension Asthma, Hypertension N/A Date Acquired: 05/15/2020 05/15/2020 N/A Weeks of Treatment: 12 12 N/A Wound Status: Open Open  N/A Measurements L x W x D (cm) 3.2x1.2x0.1 0.1x0.1x0.1 N/A Area (cm) : 3.016 0.008 N/A Volume (cm) : 0.302 0.001 N/A % Reduction in Area: 49.50% 99.30% N/A % Reduction in Volume: 49.40% 99.20% N/A Classification: Full Thickness Without Exposed Full Thickness Without Exposed N/A Support Structures Support Structures Exudate Amount: Medium Medium N/A Exudate Type: Serosanguineous Serosanguineous N/A Exudate Color: red, brown red, brown N/A Wound Margin: N/A Flat and Intact N/A Granulation Amount: Large (67-100%) None Present (0%) N/A Granulation Quality: Red, Hyper-granulation N/A N/A Necrotic Amount: Small (1-33%) Large (67-100%) N/A Necrotic Tissue: Adherent Slough Eschar N/A Exposed Structures: Fat Layer (Subcutaneous Tissue): Fat Layer (Subcutaneous Tissue): N/A Yes Yes Fascia: No Fascia: No Tendon: No Tendon: No Muscle: No Muscle: No Joint: No Joint: No Bone: No  Bone: No Epithelialization: None None N/A Treatment Notes Electronic Signature(s) Signed: 11/21/2020 9:53:33 AM By: Donnamarie Poag Entered By: Donnamarie Poag on 11/21/2020 08:49:59 Dendinger, Autumnrose M. ([id number]) -------------------------------------------------------------------------------- [place] Details Patient Name: Janet [name]. Date of Service: 11/21/2020 8:30 AM Medical Record Number: [id number] Patient Account Number: [id number] Date of Birth/Sex: November 05, 1950 (70 y.o. F) Treating RN: Donnamarie Poag Primary Care Jaston Havens: Delight Stare Other Clinician: Referring Aryahi Denzler: Delight Stare Treating Hanz Winterhalter/Extender: Skipper Cliche in Treatment: 12 Active Inactive Wound/Skin Impairment Nursing Diagnoses: Impaired tissue integrity Knowledge deficit related to smoking impact on wound healing Knowledge deficit related to ulceration/compromised skin integrity Goals: Patient/caregiver will verbalize understanding of skin care regimen Date Initiated: 08/29/2020 Date Inactivated: 09/20/2020 Target  Resolution Date: 09/14/2020 Goal Status: Met Ulcer/skin breakdown will have a volume reduction of 30% by week 4 Date Initiated: 08/29/2020 Date Inactivated: 10/17/2020 Target Resolution Date: 09/29/2020 Goal Status: Met Ulcer/skin breakdown will have a volume reduction of 50% by week 8 Date Initiated: 08/29/2020 Date Inactivated: 11/14/2020 Target Resolution Date: 10/29/2020 Goal Status: Met Ulcer/skin breakdown will have a volume reduction of 80% by week 12 Date Initiated: 08/29/2020 Target Resolution Date: 11/29/2020 Goal Status: Active Ulcer/skin breakdown will heal within 14 weeks Date Initiated: 08/29/2020 Target Resolution Date: 12/14/2020 Goal Status: Active Interventions: Assess patient/caregiver ability to obtain necessary supplies Assess patient/caregiver ability to perform ulcer/skin care regimen upon admission and as needed Assess ulceration(s) every visit Provide education on ulcer and skin care Notes: Electronic Signature(s) Signed: 11/21/2020 9:53:33 AM By: Donnamarie Poag Entered By: Donnamarie Poag on 11/21/2020 08:49:51 Merced, Dowell. ([id number]) -------------------------------------------------------------------------------- Pain Assessment Details Patient Name: Janet [name]. Date of Service: 11/21/2020 8:30 AM Medical Record Number: [id number] Patient Account Number: [id number] Date of Birth/Sex: Mar 11, 1950 (70 y.o. F) Treating RN: Donnamarie Poag Primary Care Tressy Kunzman: Delight Stare Other Clinician: Referring Devetta Hagenow: Delight Stare Treating Shaylie Eklund/Extender: Skipper Cliche in Treatment: 12 Active Problems Location of Pain Severity and Description of Pain Patient Has Paino Yes Site Locations Pain Location: Pain in Ulcers Rate the pain. Current Pain Level: 6 Pain Management and Medication Current Pain Management: Electronic Signature(s) Signed: 11/21/2020 9:53:33 AM By: Donnamarie Poag Entered By: Donnamarie Poag on 11/21/2020 08:43:12 Levinson, Analycia M.  ([id number]) -------------------------------------------------------------------------------- Patient/Caregiver Education Details Patient Name: Janet [name]. Date of Service: 11/21/2020 8:30 AM Medical Record Number: [id number] Patient Account Number: [id number] Date of Birth/Gender: 1950-11-28 (70 y.o. F) Treating RN: Donnamarie Poag Primary Care Physician: Delight Stare Other Clinician: Referring Physician: Delight Stare Treating Physician/Extender: Skipper Cliche in Treatment: 12 Education Assessment Education Provided To: Patient Education Topics Provided Venous: Wound/Skin Impairment: Electronic Signature(s) Signed: 11/21/2020 9:53:33 AM By: Donnamarie Poag Entered By: Donnamarie Poag on 11/21/2020 08:59:33 Stroope, Narcissus M. ([id number]) -------------------------------------------------------------------------------- Wound Assessment Details Patient Name: Janet [name]. Date of Service: 11/21/2020 8:30 AM Medical Record Number: [id number] Patient Account Number: [id number] Date of Birth/Sex: [birth year] (70 y.o. F) Treating RN: Donnamarie Poag Primary Care Kyren Knick: Delight Stare Other Clinician: Referring Bertrum Helmstetter: Delight Stare Treating Kaydince Towles/Extender: Skipper Cliche in Treatment: 12 Wound Status Wound Number: 1 Primary Etiology: Venous Leg Ulcer Wound Location: Left, Distal, Anterior Lower Leg Secondary Etiology: Lymphedema Wounding Event: Blister Wound Status: Open Date Acquired: 05/15/2020 Comorbid History: Asthma, Hypertension Weeks Of Treatment: 12 Clustered Wound: No Photos Wound Measurements Length: (cm) 3.2 Width: (cm) 1.2 Depth: (cm) 0.1 Area: (cm) 3.016 Volume: (cm) 0.302 % Reduction in Area: 49.5% % Reduction in Volume: 49.4% Epithelialization: None Tunneling: No Undermining: No Wound Description Classification: Full Thickness Without Exposed  Support Structu Exudate Amount: Medium Exudate Type: Serosanguineous Exudate Color: red, brown res Foul Odor After  Cleansing: No Slough/Fibrino Yes Wound Bed Granulation Amount: Large (67-100%) Exposed Structure Granulation Quality: Red, Hyper-granulation Fascia Exposed: No Necrotic Amount: Small (1-33%) Fat Layer (Subcutaneous Tissue) Exposed: Yes Necrotic Quality: Adherent Slough Tendon Exposed: No Muscle Exposed: No Joint Exposed: No Bone Exposed: No Treatment Notes Wound #1 (Lower Leg) Wound Laterality: Left, Anterior, Distal Cleanser Peri-Wound Care Triamcinolone Acetonide Cream, 0.1%, 15 (g) tube Discharge Instruction: apply to WOUND and peri Liese, Tambria M. ([id number]) Topical Primary Dressing Hydrofera Blue Ready Transfer Foam, 2.5x2.5 (in/in) Discharge Instruction: Apply Hydrofera Blue Ready to wound bed as directed Secondary Dressing Secured With Compression Wrap Profore Lite LF 3 Multilayer Compression Bandaging System Discharge Instruction: Apply 3 multi-layer wrap as prescribed. Compression Stockings Add-Ons Electronic Signature(s) Signed: 11/21/2020 9:53:33 AM By: Donnamarie Poag Entered By: Donnamarie Poag on 11/21/2020 08:48:02 Fini, Jannah M. ([id number]) -------------------------------------------------------------------------------- Wound Assessment Details Patient Name: Janet [name]. Date of Service: 11/21/2020 8:30 AM Medical Record Number: [id number] Patient Account Number: [id number] Date of Birth/Sex: 09/03/1950 (70 y.o. F) Treating RN: Donnamarie Poag Primary Care Temeca Somma: Delight Stare Other Clinician: Referring Quame Spratlin: Delight Stare Treating Aracelis Ulrey/Extender: Skipper Cliche in Treatment: 12 Wound Status Wound Number: 2 Primary Etiology: Venous Leg Ulcer Wound Location: Left, Proximal, Anterior Lower Leg Secondary Etiology: Lymphedema Wounding Event: Blister Wound Status: Healed - Epithelialized Date Acquired: 05/15/2020 Comorbid History: Asthma, Hypertension Weeks Of Treatment: 12 Clustered Wound: No Photos Wound Measurements Length: (cm) Width: (cm) Depth:  (cm) Area: (cm) Volume: (cm) 0 % Reduction in Area: 100% 0 % Reduction in Volume: 100% 0 Epithelialization: Large (67-100%) 0 Tunneling: No 0 Undermining: No Wound Description Classification: Full Thickness Without Exposed Support Structure Wound Margin: Flat and Intact Exudate Amount: None Present s Foul Odor After Cleansing: No Slough/Fibrino No Wound Bed Granulation Amount: None Present (0%) Exposed Structure Necrotic Amount: None Present (0%) Fascia Exposed: No Fat Layer (Subcutaneous Tissue) Exposed: No Tendon Exposed: No Muscle Exposed: No Joint Exposed: No Bone Exposed: No Electronic Signature(s) Signed: 11/21/2020 9:53:33 AM By: Donnamarie Poag Entered By: Donnamarie Poag on 11/21/2020 08:58:56 Honea, Amberlee M. ([id number]) -------------------------------------------------------------------------------- Vitals Details Patient Name: Janet [name]. Date of Service: 11/21/2020 8:30 AM Medical Record Number: [id number] Patient Account Number: [id number] Date of Birth/Sex: March 30, 1950 (70 y.o. F) Treating RN: Donnamarie Poag Primary Care Necie Wilcoxson: Delight Stare Other Clinician: Referring Nanci Lakatos: Delight Stare Treating Reese Senk/Extender: Skipper Cliche in Treatment: 12 Vital Signs Time Taken: 08:42 Temperature (F): 98.2 Height (in): 66 Pulse (bpm): 84 Weight (lbs): 192 Respiratory Rate (breaths/min): 16 Body Mass Index (BMI): 31 Blood Pressure (mmHg): 157/76 Reference Range: 80 - 120 mg / dl Electronic Signature(s) Signed: 11/21/2020 9:53:33 AM By: Donnamarie Poag Entered ByDonnamarie Poag on 11/21/2020 08:42:46

## 2020-11-28 ENCOUNTER — Other Ambulatory Visit: Payer: Self-pay

## 2020-11-28 ENCOUNTER — Encounter: Payer: Medicare Other | Admitting: Physician Assistant

## 2020-11-28 DIAGNOSIS — L97822 Non-pressure chronic ulcer of other part of left lower leg with fat layer exposed: Secondary | ICD-10-CM | POA: Diagnosis not present

## 2020-11-28 NOTE — Progress Notes (Addendum)
CATHLIN, BUCHAN ([id number]) Visit Report for 11/28/2020 Chief Complaint Document Details Patient Name: Janet [name], Janet [name]. Date of Service: 11/28/2020 9:30 AM Medical Record Number: [id number] Patient Account Number: [id number] Date of Birth/Sex: Apr 13, 1950 (70 y.o. F) Treating RN: Donnamarie Poag Primary Care Provider: Delight Stare Other Clinician: Referring Provider: Delight Stare Treating Provider/Extender: Skipper Cliche in Treatment: 13 Information Obtained from: Patient Chief Complaint Left LE Ulcers Electronic Signature(s) Signed: 11/28/2020 9:49:04 AM By: Worthy Keeler PA-C Entered By: Worthy Keeler on 11/28/2020 09:49:03 Janet [name], [name] ([id number]) -------------------------------------------------------------------------------- Debridement Details Patient Name: Janet [name]. Date of Service: 11/28/2020 9:30 AM Medical Record Number: [id number] Patient Account Number: [id number] Date of Birth/Sex: 30-Oct-1950 (70 y.o. F) Treating RN: Donnamarie Poag Primary Care Provider: Delight Stare Other Clinician: Referring Provider: Delight Stare Treating Provider/Extender: Skipper Cliche in Treatment: 13 Debridement Performed for Wound #1 Left,Distal,Anterior Lower Leg Assessment: Performed By: Physician Tommie Sams., PA-C Debridement Type: Debridement Severity of Tissue Pre Debridement: Fat layer exposed Level of Consciousness (Pre- Awake and Alert procedure): Pre-procedure Verification/Time Out Yes - 10:18 Taken: Start Time: 10:18 Pain Control: Lidocaine Total Area Debrided (L x W): 2.4 (cm) x 1.2 (cm) = 2.88 (cm) Tissue and other material Viable, Non-Viable, Slough, Subcutaneous, Slough debrided: Level: Skin/Subcutaneous Tissue Debridement Description: Excisional Instrument: Curette Bleeding: Minimum Hemostasis Achieved: Pressure Response to Treatment: Procedure was tolerated well Level of Consciousness (Post- Awake and Alert procedure): Post Debridement Measurements  of Total Wound Length: (cm) 2.4 Width: (cm) 1.2 Depth: (cm) 0.1 Volume: (cm) 0.226 Character of Wound/Ulcer Post Debridement: Improved Severity of Tissue Post Debridement: Fat layer exposed Post Procedure Diagnosis Same as Pre-procedure Electronic Signature(s) Signed: 11/28/2020 4:13:54 PM By: Donnamarie Poag Signed: 11/28/2020 5:26:28 PM By: Worthy Keeler PA-C Entered By: Donnamarie Poag on 11/28/2020 10:19:56 Janet [name], [name] ([id number]) -------------------------------------------------------------------------------- HPI Details Patient Name: Janet [name]. Date of Service: 11/28/2020 9:30 AM Medical Record Number: [id number] Patient Account Number: [id number] Date of Birth/Sex: 03-May-1950 (70 y.o. F) Treating RN: Donnamarie Poag Primary Care Provider: Delight Stare Other Clinician: Referring Provider: Delight Stare Treating Provider/Extender: Skipper Cliche in Treatment: 13 History of Present Illness HPI Description: 08/29/2020 upon evaluation today patient appears to be doing somewhat poorly in regard to what appeared to be venous leg ulcers on the left leg that she has been dealing with since around the beginning of May. Fortunately she does not have any signs of obvious infection unfortunately she does have quite a bit of necrotic tissue that does not really need to be debrided. She has been seeing vascular and they did wrap her for a time with what was likely an Haematologist. With that being said the [facility] boot has given her some trouble as far as feeling claustrophobic was concerned she is not sure if she is good to be able to tolerate a compression wrap currently although we will get a try she is in agreement with Aleve given this shot. Fortunately I do not see any signs of active infection systemically which is great news and even locally she seems to be doing quite well today. Patient's Right ABI is 1.08 with a TBI of 0.79 and the left ABI is 1.15 with a TBI of 0.84 on 08/03/20. She also  has a history of chronic venous insufficiency and lymphedema but otherwise no major medical problems. 09/05/2020 upon evaluation today patient unfortunately appears to be having some issues today with her right leg this is completely different from what we have been  taking care of on the left. She actually has some swelling and an area of erythema and pain which is in the medial calf region. There is no identifiable abscess definitely this could just be cellulitis but with her history of DVT I think that we need to have this checked out. 09/12/2020 upon evaluation today patient appears to be doing better in regard to her wound. She has been tolerating the dressing changes without complication. Fortunately there does not appear to be any signs of active infection which is great news and overall I think that the patient is doing well in this regard. Her wounds are measuring smaller at both locations and the surface of the wound is looking better. With that being said she does have a wedding that she is supposed to be going to this weekend and states that she really needs to not have the wrap on for that. Nonetheless regular and discuss options to try to help maintain while she has this time with the wedding. 09/20/2020 upon evaluation today patient unfortunately is doing worse in regard to the pain associated with her wound at this time. Fortunately there does not appear to be any evidence of active infection systemically which is great news but in general I feel like locally she may definitely be infected. Subsequently I do believe that we need to address this as soon as possible. 09/26/2020 upon evaluation today patient appears to be doing well with regard to her wound. Fortunately there does not appear to be any signs of worsening in general. I think that overall the patient is making wonderful progress in my opinion. The right leg where she had an area of erythema medially also is better today which is  great news. I think Levaquin has done a great job in that regard. We did see evidence still some erythema on the right leg but again this is significantly improved compared to last week. Overall the Levaquin has done a great job in this regard. 10/03/2020 upon evaluation today patient appears to be doing well all things considered with regard to her wounds. There does not appear to be any signs of infection obvious at this point which is great news. Fortunately I think that she is definitely making progress with regard to the appearance of the wound bed but nonetheless is still having quite a bit of an issue here with the pain aspect. Fortunately I think that we have some other options to consider him and use triamcinolone underneath the [facility] today. Also think looking into PuraPly/Apligraf as a treatment could be appropriate for this patient and my hope is that we will be able to get the wounds growing in a much faster pace as far as new tissue is concerned to get this closed sooner rather than later as that is the only way that we will get a get the pain completely resolved for her which is ultimately the goal we have as well obviously. 9/26; 2 wounds on the left anterior lower leg in the setting of what appears to be severe chronic venous insufficiency. We have been using Hydrofera Blue and 3 layer compression. 10/17/2020 upon evaluation today patient appears to be doing well with regard to her leg ulcer. She has been tolerating the dressing changes without complication. Fortunately there does not appear to be any signs of active infection at this time. No fevers, chills, nausea, vomiting, or diarrhea. 10/10; 2 wounds on the left lateral calf. She has been using Hydrofera Blue. Still  complains of pain. She is on her second week of that was started last week. 10/31/2020 upon evaluation today patient's wound bed actually showed signs of good granulation and epithelization at this point.  Fortunately there does not appear to be any signs of active infection which is great news and overall very pleased with where we stand. I do think that the patient is making excellent progress based on what we are seeing I believe the triamcinolone along with a Hydrofera Blue is doing a great job. 11/07/2020 upon evaluation today patient appears to be doing well with regard to her wounds. This is going slowly but nonetheless I believe that the fact that she is responding to the triamcinolone and is doing better is a good sign I also believe this is a sign that were definitely dealing with more of an inflammatory process. Nonetheless I am extremely happy with where things stand today. 11/14/2020 upon evaluation today patient appears to be doing well with regard to her wounds. The more superior wound actually appears to be almost completely closed. The inferior area is measuring smaller but still open she still has a fairly significant amount of pain. 11/21/2020 upon inspection patient's wound bed actually showed signs of good granulation epithelization at this point. Fortunately there does not appear to be any evidence of active infection systemically which is great news and overall I am actually extremely pleased with where we stand. I do believe that the patient is making good progress in fact the wound seem to be showing signs of shrinking week by week and the progress is fairly dramatic. The proximal wound does appear to likely be healed today. 11/28/2020 upon inspection patient's wound bed actually showed signs of good granulation epithelization at this point. Fortunately I do not see any evidence of infection currently which is great news and overall I think that she is making excellent progress. JULIANAH, MARCIEL ([id number]) Electronic Signature(s) Signed: 11/28/2020 5:18:47 PM By: Worthy Keeler PA-C Entered By: Worthy Keeler on 11/28/2020 17:18:47 Janet [name], [name]  ([id number]) -------------------------------------------------------------------------------- Physical Exam Details Patient Name: Janet [name]. Date of Service: 11/28/2020 9:30 AM Medical Record Number: [id number] Patient Account Number: [id number] Date of Birth/Sex: 1950/04/04 (70 y.o. F) Treating RN: Donnamarie Poag Primary Care Provider: Delight Stare Other Clinician: Referring Provider: Delight Stare Treating Provider/Extender: Skipper Cliche in Treatment: 83 Constitutional Well-nourished and well-hydrated in no acute distress. Respiratory normal breathing without difficulty. Psychiatric this patient is able to make decisions and demonstrates good insight into disease process. Alert and Oriented x 3. pleasant and cooperative. Notes Patient's wound bed showed signs of good granulation epithelization at this point. Overall I think that she is actually managing quite nicely with regard to her wounds. I did have to perform a little bit of debridement to clear away some of the necrotic debris she tolerated that today without complication postdebridement the wound bed appears to be doing much better this was very light as I do believe this is more of a pyoderma type situation side avoid any aggressive sharp debridements. Electronic Signature(s) Signed: 11/28/2020 5:19:19 PM By: Worthy Keeler PA-C Entered By: Worthy Keeler on 11/28/2020 17:19:19 Janet [name], [name] ([id number]) -------------------------------------------------------------------------------- Physician Orders Details Patient Name: Janet [name]. Date of Service: 11/28/2020 9:30 AM Medical Record Number: [id number] Patient Account Number: [id number] Date of Birth/Sex: [birth year] (70 y.o. F) Treating RN: Donnamarie Poag Primary Care Provider: Delight Stare Other Clinician: Referring Provider: Delight Stare Treating Provider/Extender: Skipper Cliche in Treatment:  13 Verbal / Phone Orders: No Diagnosis Coding ICD-10 Coding Code  Description I87.2 Venous insufficiency (chronic) (peripheral) I89.0 Lymphedema, not elsewhere classified 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 of other part of left lower leg with fat layer exposed Follow-up Appointments o Return Appointment in 1 week. o Nurse Visit as needed Bathing/ Shower/ Hygiene o May shower with wound dressing protected with water repellent cover or cast protector. o No tub bath. Edema Control - Lymphedema / Segmental Compressive Device / Other o Optional: One layer of unna paste to top of compression wrap (to act as an anchor). - left leg o Patient to wear own compression stockings. Remove compression stockings every night before going to bed and put on every morning when getting up. - right leg-see [employer] contact and measurements Use on RIGHT leg o Patient to wear own Velcro compression garment. Remove compression stockings every night before going to bed and put on every morning when getting up. - will be mailed to you, "[facility]"-will let you know when to bring to appt o Elevate legs to the level of the heart and pump ankles as often as possible o Elevate leg(s) parallel to the floor when sitting. o DO YOUR BEST to sleep in the bed at night. DO NOT sleep in your recliner. Long hours of sitting in a recliner leads to swelling of the legs and/or potential wounds on your backside. Medications-Please add to medication list. o Take one 500mg  Tylenol (Acetaminophen) and one 200mg  Motrin (Ibuprofen) every 6 hours for pain. Do not take ibuprofen if you are on blood thinners or have stomach ulcers. - as needed for pain if not contraindicated by other physician Wound Treatment Wound #1 - Lower Leg Wound Laterality: Left, Anterior, Distal Peri-Wound Care: Triamcinolone Acetonide Cream, 0.1%, 15 (g) tube 1 x Per Week/30 Days Discharge Instructions: apply to WOUND and peri Primary Dressing: Hydrofera Blue Ready Transfer Foam, 2.5x2.5 (in/in) 1 x  Per Week/30 Days Discharge Instructions: Apply Hydrofera Blue Ready to wound bed as directed Compression Wrap: Profore Lite LF 3 Multilayer Compression Bandaging System 1 x Per Week/30 Days Discharge Instructions: Apply 3 multi-layer wrap as prescribed. Electronic Signature(s) Signed: 11/28/2020 4:13:54 PM By: Donnamarie Poag Signed: 11/28/2020 5:26:28 PM By: Worthy Keeler PA-C Entered By: Donnamarie Poag on 11/28/2020 10:21:43 Janet [name], [name] ([id number]) -------------------------------------------------------------------------------- Problem List Details Patient Name: Janet [name]. Date of Service: 11/28/2020 9:30 AM Medical Record Number: [id number] Patient Account Number: [id number] Date of Birth/Sex: 07-Jul-1950 (70 y.o. F) Treating RN: Donnamarie Poag Primary Care Provider: Delight Stare Other Clinician: Referring Provider: Delight Stare Treating Provider/Extender: Skipper Cliche in Treatment: 13 Active Problems ICD-10 Encounter Code Description Active Date MDM Diagnosis I87.2 Venous insufficiency (chronic) (peripheral) 08/29/2020 No Yes I89.0 Lymphedema, not elsewhere classified 08/29/2020 No Yes 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 of other part of left lower leg with fat layer 08/29/2020 No Yes exposed Inactive Problems ICD-10 Code Description Active Date Inactive Date L03.115 Cellulitis of right lower limb 09/05/2020 09/05/2020 R22.41 Localized swelling, mass and lump, right lower limb 09/05/2020 09/05/2020 Resolved Problems Electronic Signature(s) Signed: 11/28/2020 9:48:52 AM By: Worthy Keeler PA-C Entered By: Worthy Keeler on 11/28/2020 09:48:52 Janet [name], [name] ([id number]) -------------------------------------------------------------------------------- Progress Note Details Patient Name: Janet [name]. Date of Service: 11/28/2020 9:30 AM Medical Record Number: [id number] Patient Account Number: [id number] Date of Birth/Sex: 06/28/1950 (70 y.o. F) Treating RN: Donnamarie Poag Primary  Care Provider: Delight Stare Other Clinician: Referring Provider: Delight Stare Treating Provider/Extender: Skipper Cliche in Treatment: [phone]  Subjective Chief Complaint Information obtained from Patient Left LE Ulcers History of Present Illness (HPI) 08/29/2020 upon evaluation today patient appears to be doing somewhat poorly in regard to what appeared to be venous leg ulcers on the left leg that she has been dealing with since around the beginning of May. Fortunately she does not have any signs of obvious infection unfortunately she does have quite a bit of necrotic tissue that does not really need to be debrided. She has been seeing vascular and they did wrap her for a time with what was likely an Haematologist. With that being said the [facility] boot has given her some trouble as far as feeling claustrophobic was concerned she is not sure if she is good to be able to tolerate a compression wrap currently although we will get a try she is in agreement with Aleve given this shot. Fortunately I do not see any signs of active infection systemically which is great news and even locally she seems to be doing quite well today. Patient's Right ABI is 1.08 with a TBI of 0.79 and the left ABI is 1.15 with a TBI of 0.84 on 08/03/20. She also has a history of chronic venous insufficiency and lymphedema but otherwise no major medical problems. 09/05/2020 upon evaluation today patient unfortunately appears to be having some issues today with her right leg this is completely different from what we have been taking care of on the left. She actually has some swelling and an area of erythema and pain which is in the medial calf region. There is no identifiable abscess definitely this could just be cellulitis but with her history of DVT I think that we need to have this checked out. 09/12/2020 upon evaluation today patient appears to be doing better in regard to her wound. She has been tolerating the dressing  changes without complication. Fortunately there does not appear to be any signs of active infection which is great news and overall I think that the patient is doing well in this regard. Her wounds are measuring smaller at both locations and the surface of the wound is looking better. With that being said she does have a wedding that she is supposed to be going to this weekend and states that she really needs to not have the wrap on for that. Nonetheless regular and discuss options to try to help maintain while she has this time with the wedding. 09/20/2020 upon evaluation today patient unfortunately is doing worse in regard to the pain associated with her wound at this time. Fortunately there does not appear to be any evidence of active infection systemically which is great news but in general I feel like locally she may definitely be infected. Subsequently I do believe that we need to address this as soon as possible. 09/26/2020 upon evaluation today patient appears to be doing well with regard to her wound. Fortunately there does not appear to be any signs of worsening in general. I think that overall the patient is making wonderful progress in my opinion. The right leg where she had an area of erythema medially also is better today which is great news. I think Levaquin has done a great job in that regard. We did see evidence still some erythema on the right leg but again this is significantly improved compared to last week. Overall the Levaquin has done a great job in this regard. 10/03/2020 upon evaluation today patient appears to be doing well all things considered with regard to  her wounds. There does not appear to be any signs of infection obvious at this point which is great news. Fortunately I think that she is definitely making progress with regard to the appearance of the wound bed but nonetheless is still having quite a bit of an issue here with the pain aspect. Fortunately I think that we  have some other options to consider him and use triamcinolone underneath the [facility] today. Also think looking into PuraPly/Apligraf as a treatment could be appropriate for this patient and my hope is that we will be able to get the wounds growing in a much faster pace as far as new tissue is concerned to get this closed sooner rather than later as that is the only way that we will get a get the pain completely resolved for her which is ultimately the goal we have as well obviously. 9/26; 2 wounds on the left anterior lower leg in the setting of what appears to be severe chronic venous insufficiency. We have been using Hydrofera Blue and 3 layer compression. 10/17/2020 upon evaluation today patient appears to be doing well with regard to her leg ulcer. She has been tolerating the dressing changes without complication. Fortunately there does not appear to be any signs of active infection at this time. No fevers, chills, nausea, vomiting, or diarrhea. 10/10; 2 wounds on the left lateral calf. She has been using Hydrofera Blue. Still complains of pain. She is on her second week of that was started last week. 10/31/2020 upon evaluation today patient's wound bed actually showed signs of good granulation and epithelization at this point. Fortunately there does not appear to be any signs of active infection which is great news and overall very pleased with where we stand. I do think that the patient is making excellent progress based on what we are seeing I believe the triamcinolone along with a Hydrofera Blue is doing a great job. 11/07/2020 upon evaluation today patient appears to be doing well with regard to her wounds. This is going slowly but nonetheless I believe that the fact that she is responding to the triamcinolone and is doing better is a good sign I also believe this is a sign that were definitely dealing with more of an inflammatory process. Nonetheless I am extremely happy with where  things stand today. 11/14/2020 upon evaluation today patient appears to be doing well with regard to her wounds. The more superior wound actually appears to be almost completely closed. The inferior area is measuring smaller but still open she still has a fairly significant amount of pain. 11/21/2020 upon inspection patient's wound bed actually showed signs of good granulation epithelization at this point. Fortunately there does not appear to be any evidence of active infection systemically which is great news and overall I am actually extremely pleased with where we stand. I do believe that the patient is making good progress in fact the wound seem to be showing signs of shrinking week by week and the progress is Janet [name], [name] ([id number]) fairly dramatic. The proximal wound does appear to likely be healed today. 11/28/2020 upon inspection patient's wound bed actually showed signs of good granulation epithelization at this point. Fortunately I do not see any evidence of infection currently which is great news and overall I think that she is making excellent progress. Objective Constitutional Well-nourished and well-hydrated in no acute distress. Vitals Time Taken: 9:45 AM, Height: 66 in, Weight: 192 lbs, BMI: 31, Temperature: 98.2 F, Pulse: 79 bpm,  Respiratory Rate: 16 breaths/min, Blood Pressure: 158/76 mmHg. Respiratory normal breathing without difficulty. Psychiatric this patient is able to make decisions and demonstrates good insight into disease process. Alert and Oriented x 3. pleasant and cooperative. General Notes: Patient's wound bed showed signs of good granulation epithelization at this point. Overall I think that she is actually managing quite nicely with regard to her wounds. 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 Integumentary (Hair, Skin) Wound #1 status is Open. Original cause of wound was Blister. The date acquired was: 05/15/2020. The wound has been in treatment 13 weeks. The wound is located on the [facility] Lower Leg. The wound measures 2.4cm length x 1.2cm width x 0.1cm depth; 2.262cm^2 area and 0.226cm^3 volume. There is Fat Layer (Subcutaneous Tissue) exposed. There is no tunneling or undermining noted. There is a medium amount of serosanguineous drainage noted. There is large (67-100%) red, hyper - granulation within the wound bed. There is a small (1-33%) amount of necrotic tissue within the wound bed including Adherent Slough. Assessment Active Problems ICD-10 Venous insufficiency (chronic) (peripheral) Lymphedema, not elsewhere classified Non-pressure chronic ulcer of other part of left lower leg with fat layer exposed Procedures Wound #1 Pre-procedure diagnosis of Wound #1 is a Venous Leg Ulcer located on the Left,Distal,Anterior Lower Leg .Severity of Tissue Pre Debridement is: Fat layer exposed. There was a Excisional Skin/Subcutaneous Tissue Debridement with a total area of 2.88 sq cm performed by Tommie Sams., PA-C. With the following instrument(s): Curette to remove Viable and Non-Viable tissue/material. Material removed includes Subcutaneous Tissue and Slough and after achieving pain control using Lidocaine. A time out was conducted at 10:18, prior to the start of the procedure. A Minimum amount of bleeding was controlled with Pressure. The procedure was tolerated well. Post Debridement Measurements: 2.4cm length x 1.2cm width x 0.1cm depth; 0.226cm^3 volume. Character of Wound/Ulcer Post Debridement is improved. Severity of Tissue Post Debridement is: Fat layer exposed. Post procedure Diagnosis Wound #1: Same as Pre-Procedure Pre-procedure diagnosis of  Wound #1 is a Venous Leg Ulcer located on the Left,Distal,Anterior Lower Leg . There was a Three Layer Compression Therapy Procedure by Donnamarie Poag, RN. Post procedure Diagnosis Wound #1: Same as Pre-Procedure Janet [name], [name] ([id number]) Plan Follow-up Appointments: Return Appointment in 1 week. Nurse Visit as needed Bathing/ Shower/ Hygiene: May shower with wound dressing protected with water repellent cover or cast protector. No tub bath. Edema Control - Lymphedema / Segmental Compressive Device / Other: Optional: One layer of unna paste to top of compression wrap (to act as an anchor). - left leg Patient to wear own compression stockings. Remove compression stockings every night before going to bed and put on every morning when getting up. - right leg-see Elastic Therapy contact and measurements Use on RIGHT leg Patient to wear own Velcro compression garment. Remove compression stockings every night before going to bed and put on every morning when getting up. - will be mailed to you, "[facility]"-will let you know when to bring to appt Elevate legs to the level of the heart and pump ankles as often as possible Elevate leg(s) parallel to the floor when sitting. DO YOUR BEST to sleep in the bed at night. DO  NOT sleep in your recliner. Long hours of sitting in a recliner leads to swelling of the legs and/or potential wounds on your backside. Medications-Please add to medication list.: Take one 500mg  Tylenol (Acetaminophen) and one 200mg  Motrin (Ibuprofen) every 6 hours for pain. Do not take ibuprofen if you are on blood thinners or have stomach ulcers. - as needed for pain if not contraindicated by other physician WOUND #1: - Lower Leg Wound Laterality: Left, Anterior, Distal Peri-Wound Care: Triamcinolone Acetonide Cream, 0.1%, 15 (g) tube 1 x Per Week/30 Days Discharge Instructions: apply to WOUND and peri Primary Dressing: Hydrofera Blue Ready Transfer Foam, 2.5x2.5 (in/in) 1 x Per  Week/30 Days Discharge Instructions: Apply Hydrofera Blue Ready to wound bed as directed Compression Wrap: Profore Lite LF 3 Multilayer Compression Bandaging System 1 x Per Week/30 Days Discharge Instructions: Apply 3 multi-layer wrap as prescribed. 1. Would recommend currently that we going to continue with the wound care measures as before and the patient is in agreement with that plan. This includes the use of the triamcinolone which I think is doing a good job. 2. I am also can recommend that we continue with the [facility] to cover. 3. We will continue with a 3 layer compression wrap. 4. I did recommend she should be using a compression stocking on his right leg as well the more that she does the better off she will be. We will see patient back for reevaluation in 1 week here in the clinic. If anything worsens or changes patient will contact our office for additional recommendations. Electronic Signature(s) Signed: 11/28/2020 5:19:54 PM By: Worthy Keeler PA-C Entered By: Worthy Keeler on 11/28/2020 17:19:54 Janet [name], [name] ([id number]) -------------------------------------------------------------------------------- SuperBill Details Patient Name: Janet [name]. Date of Service: 11/28/2020 Medical Record Number: [id number] Patient Account Number: [id number] Date of Birth/Sex: 04/16/1950 (70 y.o. F) Treating RN: Donnamarie Poag Primary Care Provider: Delight Stare Other Clinician: Referring Provider: Delight Stare Treating Provider/Extender: Skipper Cliche in Treatment: 13 Diagnosis Coding ICD-10 Codes Code Description I87.2 Venous insufficiency (chronic) (peripheral) I89.0 Lymphedema, not elsewhere classified 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 of other part of left lower leg with fat layer exposed Facility Procedures CPT4 Code: 37628315 Description: [place] TISSUE 20 SQ CM/< Modifier: Quantity: 1 CPT4 Code: Description: ICD-10 Diagnosis Description L97.822  Non-pressure chronic ulcer of other part of left lower leg with fat layer Modifier: exposed Quantity: Physician Procedures CPT4 Code: 1761607 Description: 37106 - WC PHYS SUBQ TISS 20 SQ CM Modifier: Quantity: 1 CPT4 Code: Description: ICD-10 Diagnosis Description 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 of other part of left lower leg with fat layer Modifier: exposed Quantity: Electronic Signature(s) Signed: 11/28/2020 5:25:54 PM By: Worthy Keeler PA-C Previous Signature: 11/28/2020 4:13:54 PM Version By: Donnamarie Poag Entered By: Worthy Keeler on 11/28/2020 17:25:54

## 2020-11-28 NOTE — Progress Notes (Signed)
Janet [name], Janet [name] ([id number]) Visit Report for 11/28/2020 Arrival Information Details Patient Name: Janet [name], Janet [name]. Date of Service: 11/28/2020 9:30 AM Medical Record Number: [id number] Patient Account Number: [id number] Date of Birth/Sex: 05/11/1950 (70 y.o. F) Treating RN: Janet [name] Primary Care Janet [name]: Janet [name] Other Clinician: Referring Janet [name]: Janet [name] Treating Janet [name]/Extender: Janet [name] in Treatment: 13 Visit Information History Since Last Visit Added or deleted any medications: No Patient Arrived: Ambulatory Had a fall or experienced change in No Arrival Time: 09:39 activities of daily living that may affect Accompanied By: self risk of falls: Transfer Assistance: None Hospitalized since last visit: No Patient Requires Transmission-Based No Has Dressing in Place as Prescribed: Yes Precautions: Has Compression in Place as Prescribed: Yes Patient Has Alerts: Yes Pain Present Now: Yes Patient Alerts: Patient on Blood Thinner Aspirin 59m AVVS ABI/TBI 7/22 ABI L- 1.15/ R- 1.08 Electronic Signature(s) Signed: 11/28/2020 4:13:54 PM By: BDonnamarie PoagEntered By: BDonnamarie Poagon 11/28/2020 09:45:[address] [place] ([id number] -------------------------------------------------------------------------------- Clinic Level of Care Assessment Details Patient Name: Janet [name] Date of Service: 11/28/2020 9:30 AM Medical Record Number: [id number] Account Number: [id number] of Birth/Sex: [birth year](70y.o. F) Treating RN: BDonnamarie PoagPrimary Care Janet [name]: MDelight StareOther Clinician: Referring Kenzlei Runions: MDelight StareTreating Jhamari Markowicz/Extender: SSkipper Clichein Treatment: 13 Clinic Level of Care Assessment Items TOOL 1 Quantity Score '[]'  - Use when EandM and Procedure is performed on INITIAL visit 0 ASSESSMENTS - Nursing Assessment / Reassessment '[]'  - General Physical Exam (combine w/ comprehensive assessment (listed just below) when  performed on new 0 pt. evals) '[]'  - 0 Comprehensive Assessment (HX, ROS, Risk Assessments, Wounds Hx, etc.) ASSESSMENTS - Wound and Skin Assessment / Reassessment '[]'  - Dermatologic / Skin Assessment (not related to wound area) 0 ASSESSMENTS - Ostomy and/or Continence Assessment and Care '[]'  - Incontinence Assessment and Management 0 '[]'  - 0 Ostomy Care Assessment and Management (repouching, etc.) PROCESS - Coordination of Care '[]'  - Simple Patient / Family Education for ongoing care 0 '[]'  - 0 Complex (extensive) Patient / Family Education for ongoing care '[]'  - 0 Staff obtains CProgrammer, systems Records, Test Results / Process Orders '[]'  - 0 Staff telephones HHA, Nursing Homes / Clarify orders / etc '[]'  - 0 Routine Transfer to another Facility (non-emergent condition) '[]'  - 0 Routine Hospital Admission (non-emergent condition) '[]'  - 0 New Admissions / IBiomedical engineer/ Ordering NPWT, Apligraf, etc. '[]'  - 0 Emergency Hospital Admission (emergent condition) PROCESS - Special Needs '[]'  - Pediatric / Minor Patient Management 0 '[]'  - 0 Isolation Patient Management '[]'  - 0 Hearing / Language / Visual special needs '[]'  - 0 Assessment of Community assistance (transportation, D/C planning, etc.) '[]'  - 0 Additional assistance / Altered mentation '[]'  - 0 Support Surface(s) Assessment (bed, cushion, seat, etc.) INTERVENTIONS - Miscellaneous '[]'  - External ear exam 0 '[]'  - 0 Patient Transfer (multiple staff / HCivil Service fast streamer/ Similar devices) '[]'  - 0 Simple Staple / Suture removal (25 or less) '[]'  - 0 Complex Staple / Suture removal (26 or more) '[]'  - 0 Hypo/Hyperglycemic Management (do not check if billed separately) '[]'  - 0 Ankle / Brachial Index (ABI) - do not check if billed separately Has the patient been seen at the hospital within the last three years: Yes Total Score: 0 Level Of Care: ____ Janet [name]([id number] Electronic Signature(s) Signed: 11/28/2020 4:13:54 PM By: BDonnamarie PoagEntered By: BDonnamarie Poagon 11/28/2020 10:18:53 [name], [name] ([id number] -------------------------------------------------------------------------------- Compression Therapy  Details Patient Name: Janet [name]. Date of Service: 11/28/2020 9:30 AM Medical Record Number: [id number] Patient Account Number: [id number] Date of Birth/Sex: Apr 10, 1950 (70 y.o. F) Treating RN: Janet [name] Primary Care Janet [name]: Janet [name] Other Clinician: Referring Kemyra August: Janet [name] Treating Janet [name]/Extender: Janet [name] in Treatment: 13 Compression Therapy Performed for Wound Assessment: Wound #1 Left,Distal,Anterior Lower Leg Performed By: Janet Argyle, RN Compression Type: Three Layer Post Procedure Diagnosis Same as Pre-procedure Electronic Signature(s) Signed: 11/28/2020 4:13:54 PM By: Janet [name] Entered By: Janet [name] on 11/28/2020 10:17:49 Righter, Storie M. ([id number]) -------------------------------------------------------------------------------- Encounter Discharge Information Details Patient Name: Janet [name]. Date of Service: 11/28/2020 9:30 AM Medical Record Number: [id number] Patient Account Number: [id number] Date of Birth/Sex: [birth year] (70 y.o. F) Treating RN: Janet [name] Primary Care Mycal Conde: Janet [name] Other Clinician: Referring Jacarius Handel: Janet [name] Treating Deunta Beneke/Extender: Janet [name] in Treatment: 13 Encounter Discharge Information Items Post Procedure Vitals Discharge Condition: Stable Temperature (F): 98.2 Ambulatory Status: Ambulatory Pulse (bpm): 79 Discharge Destination: Home Respiratory Rate (breaths/min): 16 Transportation: Private Auto Blood Pressure (mmHg): 158/76 Accompanied By: self Schedule Follow-up Appointment: Yes Clinical Summary of Care: Electronic Signature(s) Signed: 11/28/2020 4:13:54 PM By: Janet [name] Entered By: Janet [name] on 11/28/2020 10:29:01 [name], [name]  ([id number]) -------------------------------------------------------------------------------- Lower Extremity Assessment Details Patient Name: Janet [name]. Date of Service: 11/28/2020 9:30 AM Medical Record Number: [id number] Patient Account Number: [id number] Date of Birth/Sex: [birth year] (70 y.o. F) Treating RN: Janet [name] Primary Care Zyree Traynham: Janet [name] Other Clinician: Referring Mao Lockner: Janet [name] Treating Kenniya Westrich/Extender: Janet [name] in Treatment: 13 Edema Assessment Assessed: [Left: No] Patrice Paradise: No] [Left: Edema] [Right: :] Calf Left: Right: Point of Measurement: 29 cm From Medial Instep 38.5 cm Ankle Left: Right: Point of Measurement: 10 cm From Medial Instep 24 cm Knee To Floor Left: Right: From Medial Instep 43 cm Vascular Assessment Pulses: Dorsalis Pedis Palpable: [Left:Yes] Electronic Signature(s) Signed: 11/28/2020 4:13:54 PM By: Janet [name] Entered By: Janet [name] on 11/28/2020 09:51:35 Venier, Mehlani M. ([id number]) -------------------------------------------------------------------------------- Multi Wound Chart Details Patient Name: Janet [name]. Date of Service: 11/28/2020 9:30 AM Medical Record Number: [id number] Patient Account Number: [id number] Date of Birth/Sex: July 09, 1950 (70 y.o. F) Treating RN: Janet [name] Primary Care Fransisco Messmer: Janet [name] Other Clinician: Referring Khi Mcmillen: Janet [name] Treating Dayn Barich/Extender: Janet [name] in Treatment: 13 Vital Signs Height(in): 66 Pulse(bpm): 61 Weight(lbs): 192 Blood Pressure(mmHg): 158/76 Body Mass Index(BMI): 31 Temperature(F): 98.2 Respiratory Rate(breaths/min): 16 Photos: [N/A:N/A] Wound Location: Left, Distal, Anterior Lower Leg N/A N/A Wounding Event: Blister N/A N/A Primary Etiology: Venous Leg Ulcer N/A N/A Secondary Etiology: Lymphedema N/A N/A Comorbid History: Asthma, Hypertension N/A N/A Date Acquired: 05/15/2020 N/A N/A Weeks of Treatment: 13 N/A N/A Wound  Status: Open N/A N/A Measurements L x W x D (cm) 2.4x1.2x0.1 N/A N/A Area (cm) : 2.262 N/A N/A Volume (cm) : 0.226 N/A N/A % Reduction in Area: 62.10% N/A N/A % Reduction in Volume: 62.10% N/A N/A Classification: Full Thickness Without Exposed N/A N/A Support Structures Exudate Amount: Medium N/A N/A Exudate Type: Serosanguineous N/A N/A Exudate Color: red, brown N/A N/A Granulation Amount: Large (67-100%) N/A N/A Granulation Quality: Red, Hyper-granulation N/A N/A Necrotic Amount: Small (1-33%) N/A N/A Exposed Structures: Fat Layer (Subcutaneous Tissue): N/A N/A Yes Fascia: No Tendon: No Muscle: No Joint: No Bone: No Epithelialization: None N/A N/A Treatment Notes Electronic Signature(s) Signed: 11/28/2020 4:13:54 PM By: Janet [name] Entered By: Janet [name] on 11/28/2020 10:17:07 Broman, Elonda M. ([id number]) -------------------------------------------------------------------------------- Multi-Disciplinary Care Plan Details Patient Name:  Arcia, Callen M. Date of Service: 11/28/2020 9:30 AM Medical Record Number: [id number] Patient Account Number: [id number] Date of Birth/Sex: [birth year] (70 y.o. F) Treating RN: Janet [name] Primary Care Hellena Pridgen: Janet [name] Other Clinician: Referring Merced Brougham: Janet [name] Treating Karla Pavone/Extender: Janet [name] in Treatment: 13 Active Inactive Wound/Skin Impairment Nursing Diagnoses: Impaired tissue integrity Knowledge deficit related to smoking impact on wound healing Knowledge deficit related to ulceration/compromised skin integrity Goals: Patient/caregiver will verbalize understanding of skin care regimen Date Initiated: 08/29/2020 Date Inactivated: 09/20/2020 Target Resolution Date: 09/14/2020 Goal Status: Met Ulcer/skin breakdown will have a volume reduction of 30% by week 4 Date Initiated: 08/29/2020 Date Inactivated: 10/17/2020 Target Resolution Date: 09/29/2020 Goal Status: Met Ulcer/skin breakdown will have a volume  reduction of 50% by week 8 Date Initiated: 08/29/2020 Date Inactivated: 11/14/2020 Target Resolution Date: 10/29/2020 Goal Status: Met Ulcer/skin breakdown will have a volume reduction of 80% by week 12 Date Initiated: 08/29/2020 Target Resolution Date: 11/29/2020 Goal Status: Active Ulcer/skin breakdown will heal within 14 weeks Date Initiated: 08/29/2020 Target Resolution Date: 12/14/2020 Goal Status: Active Interventions: Assess patient/caregiver ability to obtain necessary supplies Assess patient/caregiver ability to perform ulcer/skin care regimen upon admission and as needed Assess ulceration(s) every visit Provide education on ulcer and skin care Notes: Electronic Signature(s) Signed: 11/28/2020 4:13:54 PM By: Janet [name] Entered By: Janet [name] on 11/28/2020 09:51:49 Grandmaison, Kadian M. ([id number]) -------------------------------------------------------------------------------- Pain Assessment Details Patient Name: Janet [name]. Date of Service: 11/28/2020 9:30 AM Medical Record Number: [id number] Patient Account Number: [id number] Date of Birth/Sex: 1950-09-12 (70 y.o. F) Treating RN: Janet [name] Primary Care Stonewall Doss: Janet [name] Other Clinician: Referring Jayin Derousse: Janet [name] Treating Bennie Chirico/Extender: Janet [name] in Treatment: 13 Active Problems Location of Pain Severity and Description of Pain Patient Has Paino Yes Site Locations Pain Location: Generalized Pain, Pain in Ulcers Rate the pain. Current Pain Level: 7 Pain Management and Medication Current Pain Management: Electronic Signature(s) Signed: 11/28/2020 4:13:54 PM By: Janet [name] Entered By: Janet [name] on 11/28/2020 09:46:45 Karn, Marleni M. ([id number]) -------------------------------------------------------------------------------- Patient/Caregiver Education Details Patient Name: Janet [name]. Date of Service: 11/28/2020 9:30 AM Medical Record Number: [id number] Patient Account Number:  [id number] Date of Birth/Gender: 1950/01/19 (70 y.o. F) Treating RN: Janet [name] Primary Care Physician: Janet [name] Other Clinician: Referring Physician: Delight [name] Treating Physician/Extender: Janet [name] in Treatment: 13 Education Assessment Education Provided To: Patient Education Topics Provided Wound Debridement: Wound/Skin Impairment: Electronic Signature(s) Signed: 11/28/2020 4:13:54 PM By: Janet [name] Entered By: Janet [name] on 11/28/2020 10:20:55 [place]head, Nisreen M. ([id number]) -------------------------------------------------------------------------------- Wound Assessment Details Patient Name: Janet [name]. Date of Service: 11/28/2020 9:30 AM Medical Record Number: [id number] Patient Account Number: [id number] Date of Birth/Sex: [birth year] (70 y.o. F) Treating RN: Janet [name] Primary Care Kennley Schwandt: Janet [name] Other Clinician: Referring Lopaka Karge: Janet [name] Treating Kylea Berrong/Extender: Janet [name] in Treatment: 13 Wound Status Wound Number: 1 Primary Etiology: Venous Leg Ulcer Wound Location: Left, Distal, Anterior Lower Leg Secondary Etiology: Lymphedema Wounding Event: Blister Wound Status: Open Date Acquired: 05/15/2020 Comorbid History: Asthma, Hypertension Weeks Of Treatment: 13 Clustered Wound: No Photos Wound Measurements Length: (cm) 2.4 Width: (cm) 1.2 Depth: (cm) 0.1 Area: (cm) 2.262 Volume: (cm) 0.226 % Reduction in Area: 62.1% % Reduction in Volume: 62.1% Epithelialization: None Tunneling: No Undermining: No Wound Description Classification: Full Thickness Without Exposed Support Structu Exudate Amount: Medium Exudate Type: Serosanguineous Exudate Color: red, brown res Foul Odor After Cleansing: No Slough/Fibrino Yes Wound Bed Granulation Amount: Large (67-100%) Exposed Structure Granulation Quality: Red, Hyper-granulation  Fascia Exposed: No Necrotic Amount: Small (1-33%) Fat Layer (Subcutaneous Tissue) Exposed:  Yes Necrotic Quality: Adherent Slough Tendon Exposed: No Muscle Exposed: No Joint Exposed: No Bone Exposed: No Treatment Notes Wound #1 (Lower Leg) Wound Laterality: Left, Anterior, Distal Cleanser Peri-Wound Care Triamcinolone Acetonide Cream, 0.1%, 15 (g) tube Discharge Instruction: apply to WOUND and peri Kuenzel, Karys M. ([id number]) Topical Primary Dressing Hydrofera Blue Ready Transfer Foam, 2.5x2.5 (in/in) Discharge Instruction: Apply Hydrofera Blue Ready to wound bed as directed Secondary Dressing Secured With Compression Wrap Profore Lite LF 3 Multilayer Compression Bandaging System Discharge Instruction: Apply 3 multi-layer wrap as prescribed. Compression Stockings Add-Ons Electronic Signature(s) Signed: 11/28/2020 4:13:54 PM By: Janet [name] Entered By: Janet [name] on 11/28/2020 09:50:50 Crossman, Ysela M. ([id number]) -------------------------------------------------------------------------------- Vitals Details Patient Name: Janet [name]. Date of Service: 11/28/2020 9:30 AM Medical Record Number: [id number] Patient Account Number: [id number] Date of Birth/Sex: May 02, 1950 (70 y.o. F) Treating RN: Janet [name] Primary Care Alane Hanssen: Janet [name] Other Clinician: Referring Shanta Dorvil: Janet [name] Treating Ledger Heindl/Extender: Janet [name] in Treatment: 13 Vital Signs Time Taken: 09:45 Temperature (F): 98.2 Height (in): 66 Pulse (bpm): 79 Weight (lbs): 192 Respiratory Rate (breaths/min): 16 Body Mass Index (BMI): 31 Blood Pressure (mmHg): 158/76 Reference Range: 80 - 120 mg / dl Electronic Signature(s) Signed: 11/28/2020 4:13:54 PM By: Janet [name] Entered ByDonnamarie [name] on 11/28/2020 09:46:29

## 2020-11-29 ENCOUNTER — Ambulatory Visit (INDEPENDENT_AMBULATORY_CARE_PROVIDER_SITE_OTHER): Payer: Medicare Other | Admitting: Vascular Surgery

## 2020-12-05 ENCOUNTER — Other Ambulatory Visit: Payer: Self-pay

## 2020-12-05 ENCOUNTER — Encounter: Payer: Medicare Other | Admitting: Physician Assistant

## 2020-12-05 DIAGNOSIS — L97822 Non-pressure chronic ulcer of other part of left lower leg with fat layer exposed: Secondary | ICD-10-CM | POA: Diagnosis not present

## 2020-12-05 NOTE — Progress Notes (Addendum)
BRIYONNA, OMARA ([id number]) Visit Report for 12/05/2020 Chief Complaint Document Details Patient Name: Janet [name], Janet [name]. Date of Service: 12/05/2020 8:30 AM Medical Record Number: [id number] Patient Account Number: [id number] Date of Birth/Sex: [birth year] (70 y.o. F) Treating RN: Donnamarie Poag Primary Care Provider: Delight Stare Other Clinician: Referring Provider: Delight Stare Treating Provider/Extender: Skipper Cliche in Treatment: 14 Information Obtained from: Patient Chief Complaint Left LE Ulcers Electronic Signature(s) Signed: 12/05/2020 8:51:31 AM By: Worthy Keeler PA-C Entered By: Worthy Keeler on 12/05/2020 08:51:31 [place], [place]. ([id number]) -------------------------------------------------------------------------------- HPI Details Patient Name: Janet [name]. Date of Service: 12/05/2020 8:30 AM Medical Record Number: [id number] Patient Account Number: [id number] Date of Birth/Sex: 1950-10-27 (70 y.o. F) Treating RN: Donnamarie Poag Primary Care Provider: Delight Stare Other Clinician: Referring Provider: Delight Stare Treating Provider/Extender: Skipper Cliche in Treatment: 14 History of Present Illness HPI Description: 08/29/2020 upon evaluation today patient appears to be doing somewhat poorly in regard to what appeared to be venous leg ulcers on the left leg that she has been dealing with since around the beginning of May. Fortunately she does not have any signs of obvious infection unfortunately she does have quite a bit of necrotic tissue that does not really need to be debrided. She has been seeing vascular and they did wrap her for a time with what was likely an Haematologist. With that being said the [facility] boot has given her some trouble as far as feeling claustrophobic was concerned she is not sure if she is good to be able to tolerate a compression wrap currently although we will get a try she is in agreement with Aleve given this shot. Fortunately I do not see any signs  of active infection systemically which is great news and even locally she seems to be doing quite well today. Patient's Right ABI is 1.08 with a TBI of 0.79 and the left ABI is 1.15 with a TBI of 0.84 on 08/03/20. She also has a history of chronic venous insufficiency and lymphedema but otherwise no major medical problems. 09/05/2020 upon evaluation today patient unfortunately appears to be having some issues today with her right leg this is completely different from what we have been taking care of on the left. She actually has some swelling and an area of erythema and pain which is in the medial calf region. There is no identifiable abscess definitely this could just be cellulitis but with her history of DVT I think that we need to have this checked out. 09/12/2020 upon evaluation today patient appears to be doing better in regard to her wound. She has been tolerating the dressing changes without complication. Fortunately there does not appear to be any signs of active infection which is great news and overall I think that the patient is doing well in this regard. Her wounds are measuring smaller at both locations and the surface of the wound is looking better. With that being said she does have a wedding that she is supposed to be going to this weekend and states that she really needs to not have the wrap on for that. Nonetheless regular and discuss options to try to help maintain while she has this time with the wedding. 09/20/2020 upon evaluation today patient unfortunately is doing worse in regard to the pain associated with her wound at this time. Fortunately there does not appear to be any evidence of active infection systemically which is great news but in general I feel like locally she may  definitely be infected. Subsequently I do believe that we need to address this as soon as possible. 09/26/2020 upon evaluation today patient appears to be doing well with regard to her wound. Fortunately there  does not appear to be any signs of worsening in general. I think that overall the patient is making wonderful progress in my opinion. The right leg where she had an area of erythema medially also is better today which is great news. I think Levaquin has done a great job in that regard. We did see evidence still some erythema on the right leg but again this is significantly improved compared to last week. Overall the Levaquin has done a great job in this regard. 10/03/2020 upon evaluation today patient appears to be doing well all things considered with regard to her wounds. There does not appear to be any signs of infection obvious at this point which is great news. Fortunately I think that she is definitely making progress with regard to the appearance of the wound bed but nonetheless is still having quite a bit of an issue here with the pain aspect. Fortunately I think that we have some other options to consider him and use triamcinolone underneath the [facility] today. Also think looking into PuraPly/Apligraf as a treatment could be appropriate for this patient and my hope is that we will be able to get the wounds growing in a much faster pace as far as new tissue is concerned to get this closed sooner rather than later as that is the only way that we will get a get the pain completely resolved for her which is ultimately the goal we have as well obviously. 9/26; 2 wounds on the left anterior lower leg in the setting of what appears to be severe chronic venous insufficiency. We have been using Hydrofera Blue and 3 layer compression. 10/17/2020 upon evaluation today patient appears to be doing well with regard to her leg ulcer. She has been tolerating the dressing changes without complication. Fortunately there does not appear to be any signs of active infection at this time. No fevers, chills, nausea, vomiting, or diarrhea. 10/10; 2 wounds on the left lateral calf. She has been using Hydrofera  Blue. Still complains of pain. She is on her second week of that was started last week. 10/31/2020 upon evaluation today patient's wound bed actually showed signs of good granulation and epithelization at this point. Fortunately there does not appear to be any signs of active infection which is great news and overall very pleased with where we stand. I do think that the patient is making excellent progress based on what we are seeing I believe the triamcinolone along with a Hydrofera Blue is doing a great job. 11/07/2020 upon evaluation today patient appears to be doing well with regard to her wounds. This is going slowly but nonetheless I believe that the fact that she is responding to the triamcinolone and is doing better is a good sign I also believe this is a sign that were definitely dealing with more of an inflammatory process. Nonetheless I am extremely happy with where things stand today. 11/14/2020 upon evaluation today patient appears to be doing well with regard to her wounds. The more superior wound actually appears to be almost completely closed. The inferior area is measuring smaller but still open she still has a fairly significant amount of pain. 11/21/2020 upon inspection patient's wound bed actually showed signs of good granulation epithelization at this point. Fortunately there does not  appear to be any evidence of active infection systemically which is great news and overall I am actually extremely pleased with where we stand. I do believe that the patient is making good progress in fact the wound seem to be showing signs of shrinking week by week and the progress is fairly dramatic. The proximal wound does appear to likely be healed today. 11/28/2020 upon inspection patient's wound bed actually showed signs of good granulation epithelization at this point. Fortunately I do not see any evidence of infection currently which is great news and overall I think that she is making  excellent progress. ANALUCIA, HUSH ([id number]) 12/05/2020 upon evaluation today patient appears to be doing well with regard to her leg ulcer. I am definitely showing signs of improvement here which is great news. I do not see any evidence of infection and I think we are headed in the right direction. With that being said I think the patient could be approved and evaluated for Apligraf if approved and we could see about what we could do in regard to getting this applied even for next week. Again some of this will depend on timing being that it is a holiday may have to be the following week. Either way I do think this could be beneficial in trying to help get this healed much more effectively and quickly. The patient is in agreement with giving this a trial. Electronic Signature(s) Signed: 12/05/2020 10:09:48 AM By: Worthy Keeler PA-C Entered By: Worthy Keeler on 12/05/2020 10:09:46 Fera, Tihanna M. ([id number]) -------------------------------------------------------------------------------- Physical Exam Details Patient Name: Janet [name]. Date of Service: 12/05/2020 8:30 AM Medical Record Number: [id number] Patient Account Number: [id number] Date of Birth/Sex: 24-Sep-1950 (70 y.o. F) Treating RN: Donnamarie Poag Primary Care Provider: Delight Stare Other Clinician: Referring Provider: Delight Stare Treating Provider/Extender: Skipper Cliche in Treatment: 13 Constitutional Well-nourished and well-hydrated in no acute distress. Respiratory normal breathing without difficulty. Psychiatric this patient is able to make decisions and demonstrates good insight into disease process. Alert and Oriented x 3. pleasant and cooperative. Notes Upon inspection patient's wound bed showed signs of some good granulation epithelization although this is still very slow. I do think that we could use the Apligraf to try to speed up the healing process here. Obviously the faster we get this closed better  especially considering his given her such discomfort and pain. Electronic Signature(s) Signed: 12/05/2020 10:10:10 AM By: Worthy Keeler PA-C Entered By: Worthy Keeler on 12/05/2020 10:10:09 [place]att[place], Marili MMarland Kitchen ([id number]) -------------------------------------------------------------------------------- Physician Orders Details Patient Name: Janet [name]. Date of Service: 12/05/2020 8:30 AM Medical Record Number: [id number] Patient Account Number: [id number] Date of Birth/Sex: 1950/02/16 (70 y.o. F) Treating RN: Donnamarie Poag Primary Care Provider: Delight Stare Other Clinician: Referring Provider: Delight Stare Treating Provider/Extender: Skipper Cliche in Treatment: [phone] Verbal / Phone Orders: No Diagnosis Coding ICD-10 Coding Code Description I87.2 Venous insufficiency (chronic) (peripheral) I89.0 Lymphedema, not elsewhere classified 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 of other part of left lower leg with fat layer exposed Follow-up Appointments o Return Appointment in 1 week. o Nurse Visit as needed Bathing/ Shower/ Hygiene o May shower with wound dressing protected with water repellent cover or cast protector. o No tub bath. Edema Control - Lymphedema / Segmental Compressive Device / Other o Optional: One layer of unna paste to top of compression wrap (to act as an anchor). - left leg o Patient to wear own compression stockings. Remove compression stockings every night before going  to bed and put on every morning when getting up. - right leg-see [employer] contact and measurements Use on RIGHT leg o Patient to wear own Velcro compression garment. Remove compression stockings every night before going to bed and put on every morning when getting up. - will be mailed to you, "[facility]"-will let you know when to bring to appt o Elevate legs to the level of the heart and pump ankles as often as possible o Elevate leg(s) parallel to the floor when sitting. o DO  YOUR BEST to sleep in the bed at night. DO NOT sleep in your recliner. Long hours of sitting in a recliner leads to swelling of the legs and/or potential wounds on your backside. Medications-Please add to medication list. o Take one 500mg  Tylenol (Acetaminophen) and one 200mg  Motrin (Ibuprofen) every 6 hours for pain. Do not take ibuprofen if you are on blood thinners or have stomach ulcers. - as needed for pain if not contraindicated by other physician Wound Treatment Wound #1 - Lower Leg Wound Laterality: Left, Anterior, Distal Peri-Wound Care: Triamcinolone Acetonide Cream, 0.1%, 15 (g) tube 1 x Per Week/30 Days Discharge Instructions: apply to WOUND and peri Primary Dressing: Hydrofera Blue Ready Transfer Foam, 2.5x2.5 (in/in) 1 x Per Week/30 Days Discharge Instructions: Apply Hydrofera Blue Ready to wound bed as directed Compression Wrap: Profore Lite LF 3 Multilayer Compression Bandaging System 1 x Per Week/30 Days Discharge Instructions: Apply 3 multi-layer wrap as prescribed. Electronic Signature(s) Signed: 12/05/2020 10:03:00 AM By: Donnamarie Poag Signed: 12/05/2020 5:26:28 PM By: Worthy Keeler PA-C Entered By: Donnamarie Poag on 12/05/2020 08:56:51 [place], Laycee M. ([id number]) -------------------------------------------------------------------------------- Problem List Details Patient Name: Janet [name]. Date of Service: 12/05/2020 8:30 AM Medical Record Number: [id number] Patient Account Number: [id number] Date of Birth/Sex: 1950-10-09 (70 y.o. F) Treating RN: Donnamarie Poag Primary Care Provider: Delight Stare Other Clinician: Referring Provider: Delight Stare Treating Provider/Extender: Skipper Cliche in Treatment: 14 Active Problems ICD-10 Encounter Code Description Active Date MDM Diagnosis I87.2 Venous insufficiency (chronic) (peripheral) 08/29/2020 No Yes I89.0 Lymphedema, not elsewhere classified 08/29/2020 No Yes 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 of other part of left  lower leg with fat layer 08/29/2020 No Yes exposed Inactive Problems ICD-10 Code Description Active Date Inactive Date L03.115 Cellulitis of right lower limb 09/05/2020 09/05/2020 R22.41 Localized swelling, mass and lump, right lower limb 09/05/2020 09/05/2020 Resolved Problems Electronic Signature(s) Signed: 12/05/2020 8:51:25 AM By: Worthy Keeler PA-C Entered By: Worthy Keeler on 12/05/2020 08:51:25 [place], Ricardo M. ([id number]) -------------------------------------------------------------------------------- Progress Note Details Patient Name: Janet [name]. Date of Service: 12/05/2020 8:30 AM Medical Record Number: [id number] Patient Account Number: [id number] Date of Birth/Sex: [birth year] (70 y.o. F) Treating RN: Donnamarie Poag Primary Care Provider: Delight Stare Other Clinician: Referring Provider: Delight Stare Treating Provider/Extender: Skipper Cliche in Treatment: 14 Subjective Chief Complaint Information obtained from Patient Left LE Ulcers History of Present Illness (HPI) 08/29/2020 upon evaluation today patient appears to be doing somewhat poorly in regard to what appeared to be venous leg ulcers on the left leg that she has been dealing with since around the beginning of May. Fortunately she does not have any signs of obvious infection unfortunately she does have quite a bit of necrotic tissue that does not really need to be debrided. She has been seeing vascular and they did wrap her for a time with what was likely an Haematologist. With that being said the [facility] boot has given her some trouble as far as feeling  claustrophobic was concerned she is not sure if she is good to be able to tolerate a compression wrap currently although we will get a try she is in agreement with Aleve given this shot. Fortunately I do not see any signs of active infection systemically which is great news and even locally she seems to be doing quite well today. Patient's Right ABI is 1.08 with a TBI of  0.79 and the left ABI is 1.15 with a TBI of 0.84 on 08/03/20. She also has a history of chronic venous insufficiency and lymphedema but otherwise no major medical problems. 09/05/2020 upon evaluation today patient unfortunately appears to be having some issues today with her right leg this is completely different from what we have been taking care of on the left. She actually has some swelling and an area of erythema and pain which is in the medial calf region. There is no identifiable abscess definitely this could just be cellulitis but with her history of DVT I think that we need to have this checked out. 09/12/2020 upon evaluation today patient appears to be doing better in regard to her wound. She has been tolerating the dressing changes without complication. Fortunately there does not appear to be any signs of active infection which is great news and overall I think that the patient is doing well in this regard. Her wounds are measuring smaller at both locations and the surface of the wound is looking better. With that being said she does have a wedding that she is supposed to be going to this weekend and states that she really needs to not have the wrap on for that. Nonetheless regular and discuss options to try to help maintain while she has this time with the wedding. 09/20/2020 upon evaluation today patient unfortunately is doing worse in regard to the pain associated with her wound at this time. Fortunately there does not appear to be any evidence of active infection systemically which is great news but in general I feel like locally she may definitely be infected. Subsequently I do believe that we need to address this as soon as possible. 09/26/2020 upon evaluation today patient appears to be doing well with regard to her wound. Fortunately there does not appear to be any signs of worsening in general. I think that overall the patient is making wonderful progress in my opinion. The right leg where  she had an area of erythema medially also is better today which is great news. I think Levaquin has done a great job in that regard. We did see evidence still some erythema on the right leg but again this is significantly improved compared to last week. Overall the Levaquin has done a great job in this regard. 10/03/2020 upon evaluation today patient appears to be doing well all things considered with regard to her wounds. There does not appear to be any signs of infection obvious at this point which is great news. Fortunately I think that she is definitely making progress with regard to the appearance of the wound bed but nonetheless is still having quite a bit of an issue here with the pain aspect. Fortunately I think that we have some other options to consider him and use triamcinolone underneath the [facility] today. Also think looking into PuraPly/Apligraf as a treatment could be appropriate for this patient and my hope is that we will be able to get the wounds growing in a much faster pace as far as new tissue is concerned to  get this closed sooner rather than later as that is the only way that we will get a get the pain completely resolved for her which is ultimately the goal we have as well obviously. 9/26; 2 wounds on the left anterior lower leg in the setting of what appears to be severe chronic venous insufficiency. We have been using Hydrofera Blue and 3 layer compression. 10/17/2020 upon evaluation today patient appears to be doing well with regard to her leg ulcer. She has been tolerating the dressing changes without complication. Fortunately there does not appear to be any signs of active infection at this time. No fevers, chills, nausea, vomiting, or diarrhea. 10/10; 2 wounds on the left lateral calf. She has been using Hydrofera Blue. Still complains of pain. She is on her second week of that was started last week. 10/31/2020 upon evaluation today patient's wound bed actually  showed signs of good granulation and epithelization at this point. Fortunately there does not appear to be any signs of active infection which is great news and overall very pleased with where we stand. I do think that the patient is making excellent progress based on what we are seeing I believe the triamcinolone along with a Hydrofera Blue is doing a great job. 11/07/2020 upon evaluation today patient appears to be doing well with regard to her wounds. This is going slowly but nonetheless I believe that the fact that she is responding to the triamcinolone and is doing better is a good sign I also believe this is a sign that were definitely dealing with more of an inflammatory process. Nonetheless I am extremely happy with where things stand today. 11/14/2020 upon evaluation today patient appears to be doing well with regard to her wounds. The more superior wound actually appears to be almost completely closed. The inferior area is measuring smaller but still open she still has a fairly significant amount of pain. 11/21/2020 upon inspection patient's wound bed actually showed signs of good granulation epithelization at this point. Fortunately there does not appear to be any evidence of active infection systemically which is great news and overall I am actually extremely pleased with where we stand. I do believe that the patient is making good progress in fact the wound seem to be showing signs of shrinking week by week and the progress is Ferber, Jazzman M. ([id number]) fairly dramatic. The proximal wound does appear to likely be healed today. 11/28/2020 upon inspection patient's wound bed actually showed signs of good granulation epithelization at this point. Fortunately I do not see any evidence of infection currently which is great news and overall I think that she is making excellent progress. 12/05/2020 upon evaluation today patient appears to be doing well with regard to her leg ulcer. I am  definitely showing signs of improvement here which is great news. I do not see any evidence of infection and I think we are headed in the right direction. With that being said I think the patient could be approved and evaluated for Apligraf if approved and we could see about what we could do in regard to getting this applied even for next week. Again some of this will depend on timing being that it is a holiday may have to be the following week. Either way I do think this could be beneficial in trying to help get this healed much more effectively and quickly. The patient is in agreement with giving this a trial. Objective Constitutional Well-nourished and well-hydrated in no acute distress.  Vitals Time Taken: 8:40 AM, Height: 66 in, Weight: 192 lbs, BMI: 31, Temperature: 98.5 F, Pulse: 75 bpm, Respiratory Rate: 16 breaths/min, Blood Pressure: 145/84 mmHg. Respiratory normal breathing without difficulty. Psychiatric this patient is able to make decisions and demonstrates good insight into disease process. Alert and Oriented x 3. pleasant and cooperative. General Notes: Upon inspection patient's wound bed showed signs of some good granulation epithelization although this is still very slow. I do think that we could use the Apligraf to try to speed up the healing process here. Obviously the faster we get this closed better especially considering his given her such discomfort and pain. Integumentary (Hair, Skin) Wound #1 status is Open. Original cause of wound was Blister. The date acquired was: 05/15/2020. The wound has been in treatment 14 weeks. The wound is located on the [facility] Lower Leg. The wound measures 2.4cm length x 1cm width x 0.1cm depth; 1.885cm^2 area and 0.188cm^3 volume. There is Fat Layer (Subcutaneous Tissue) exposed. There is no tunneling or undermining noted. There is a medium amount of serosanguineous drainage noted. There is large (67-100%) red, hyper -  granulation within the wound bed. There is a small (1-33%) amount of necrotic tissue within the wound bed including Adherent Slough. Assessment Active Problems ICD-10 Venous insufficiency (chronic) (peripheral) Lymphedema, not elsewhere classified Non-pressure chronic ulcer of other part of left lower leg with fat layer exposed Procedures Wound #1 Pre-procedure diagnosis of Wound #1 is a Venous Leg Ulcer located on the Left,Distal,Anterior Lower Leg . There was a Three Layer Compression Therapy Procedure by Donnamarie Poag, RN. Post procedure Diagnosis Wound #1: Same as Pre-Procedure Gulas, Layce M. ([id number]) Plan Follow-up Appointments: Return Appointment in 1 week. Nurse Visit as needed Bathing/ Shower/ Hygiene: May shower with wound dressing protected with water repellent cover or cast protector. No tub bath. Edema Control - Lymphedema / Segmental Compressive Device / Other: Optional: One layer of unna paste to top of compression wrap (to act as an anchor). - left leg Patient to wear own compression stockings. Remove compression stockings every night before going to bed and put on every morning when getting up. - right leg-see Elastic Therapy contact and measurements Use on RIGHT leg Patient to wear own Velcro compression garment. Remove compression stockings every night before going to bed and put on every morning when getting up. - will be mailed to you, "[facility]"-will let you know when to bring to appt Elevate legs to the level of the heart and pump ankles as often as possible Elevate leg(s) parallel to the floor when sitting. DO YOUR BEST to sleep in the bed at night. DO NOT sleep in your recliner. Long hours of sitting in a recliner leads to swelling of the legs and/or potential wounds on your backside. Medications-Please add to medication list.: Take one 500mg  Tylenol (Acetaminophen) and one 200mg  Motrin (Ibuprofen) every 6 hours for pain. Do not take ibuprofen if you are on  blood thinners or have stomach ulcers. - as needed for pain if not contraindicated by other physician WOUND #1: - Lower Leg Wound Laterality: Left, Anterior, Distal Peri-Wound Care: Triamcinolone Acetonide Cream, 0.1%, 15 (g) tube 1 x Per Week/30 Days Discharge Instructions: apply to WOUND and peri Primary Dressing: Hydrofera Blue Ready Transfer Foam, 2.5x2.5 (in/in) 1 x Per Week/30 Days Discharge Instructions: Apply Hydrofera Blue Ready to wound bed as directed Compression Wrap: Profore Lite LF 3 Multilayer Compression Bandaging System 1 x Per Week/30 Days Discharge Instructions: Apply 3 multi-layer wrap as  prescribed. 1. Would recommend that we go ahead and see about approval and the Apligraf and the patient is in agreement with that plan. 2. Also can I go ahead and recommend for the time being that we continue with the use of the [employer] along with a 3 layer compression wrap which is doing a good job. We will see patient back for reevaluation in 1 week here in the clinic. If anything worsens or changes patient will contact our office for additional recommendations. Electronic Signature(s) Signed: 12/05/2020 10:10:33 AM By: Worthy Keeler PA-C Entered By: Worthy Keeler on 12/05/2020 10:10:32 Mull, Remmie M. ([id number]) -------------------------------------------------------------------------------- SuperBill Details Patient Name: Janet [name]. Date of Service: 12/05/2020 Medical Record Number: [id number] Patient Account Number: [id number] Date of Birth/Sex: 1950-03-14 (70 y.o. F) Treating RN: Donnamarie Poag Primary Care Provider: Delight Stare Other Clinician: Referring Provider: Delight Stare Treating Provider/Extender: Skipper Cliche in Treatment: 14 Diagnosis Coding ICD-10 Codes Code Description I87.2 Venous insufficiency (chronic) (peripheral) I89.0 Lymphedema, not elsewhere classified 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 of other part of left lower leg with fat layer  exposed Facility Procedures CPT4 Code: 83419622 Description: (Facility Use Only) [phone] - [place] LWR LT LEG Modifier: Quantity: 1 Physician Procedures CPT4 Code: 1194174 Description: 08144 - WC PHYS LEVEL 3 - EST PT Modifier: Quantity: 1 CPT4 Code: Description: ICD-10 Diagnosis Description I87.2 Venous insufficiency (chronic) (peripheral) I89.0 Lymphedema, not elsewhere classified 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

## 2020-12-05 NOTE — Progress Notes (Signed)
SHANISHA, LECH ([id number]) Visit Report for 12/05/2020 Arrival Information Details Patient Name: Janet [name], Janet [name]. Date of Service: 12/05/2020 8:30 AM Medical Record Number: [id number] Patient Account Number: [id number] Date of Birth/Sex: 06-02-1950 (70 y.o. F) Treating RN: Donnamarie Poag Primary Care Tsuruko Murtha: Delight Stare Other Clinician: Referring Finleigh Cheong: Delight Stare Treating Annaliza Zia/Extender: Skipper Cliche in Treatment: 14 Visit Information History Since Last Visit Added or deleted any medications: No Patient Arrived: Ambulatory Had a fall or experienced change in No Arrival Time: 08:39 activities of daily living that may affect Accompanied By: self risk of falls: Transfer Assistance: None Hospitalized since last visit: No Patient Identification Verified: Yes Has Dressing in Place as Prescribed: Yes Secondary Verification Process Completed: Yes Has Compression in Place as Prescribed: Yes Patient Requires Transmission-Based No Pain Present Now: Yes Precautions: Patient Has Alerts: Yes Patient Alerts: Patient on Blood Thinner Aspirin 39m AVVS ABI/TBI 7/22 ABI L- 1.15/ R- 1.08 Electronic Signature(s) Signed: 12/05/2020 10:03:00 AM By: BDonnamarie PoagEntered By: BDonnamarie Poagon 12/05/2020 08:40:49 [name], [name] ([id number] -------------------------------------------------------------------------------- Clinic Level of Care Assessment Details Patient Name: Janet [name] Date of Service: 12/05/2020 8:30 AM Medical Record Number: [id number] Account Number: [id number] of Birth/Sex: 2August 31, 1952(70 y.o. F) Treating RN: BDonnamarie PoagPrimary Care Ruchel Brandenburger: MDelight StareOther Clinician: Referring Branden Shallenberger: MDelight StareTreating Mavric Cortright/Extender: SSkipper Clichein Treatment: 14 Clinic Level of Care Assessment Items TOOL 1 Quantity Score '[]'  - Use when EandM and Procedure is performed on INITIAL visit 0 ASSESSMENTS - Nursing Assessment / Reassessment '[]'  -  General Physical Exam (combine w/ comprehensive assessment (listed just below) when performed on new 0 pt. evals) '[]'  - 0 Comprehensive Assessment (HX, ROS, Risk Assessments, Wounds Hx, etc.) ASSESSMENTS - Wound and Skin Assessment / Reassessment '[]'  - Dermatologic / Skin Assessment (not related to wound area) 0 ASSESSMENTS - Ostomy and/or Continence Assessment and Care '[]'  - Incontinence Assessment and Management 0 '[]'  - 0 Ostomy Care Assessment and Management (repouching, etc.) PROCESS - Coordination of Care '[]'  - Simple Patient / Family Education for ongoing care 0 '[]'  - 0 Complex (extensive) Patient / Family Education for ongoing care '[]'  - 0 Staff obtains CProgrammer, systems Records, Test Results / Process Orders '[]'  - 0 Staff telephones HHA, Nursing Homes / Clarify orders / etc '[]'  - 0 Routine Transfer to another Facility (non-emergent condition) '[]'  - 0 Routine Hospital Admission (non-emergent condition) '[]'  - 0 New Admissions / IBiomedical engineer/ Ordering NPWT, Apligraf, etc. '[]'  - 0 Emergency Hospital Admission (emergent condition) PROCESS - Special Needs '[]'  - Pediatric / Minor Patient Management 0 '[]'  - 0 Isolation Patient Management '[]'  - 0 Hearing / Language / Visual special needs '[]'  - 0 Assessment of Community assistance (transportation, D/C planning, etc.) '[]'  - 0 Additional assistance / Altered mentation '[]'  - 0 Support Surface(s) Assessment (bed, cushion, seat, etc.) INTERVENTIONS - Miscellaneous '[]'  - External ear exam 0 '[]'  - 0 Patient Transfer (multiple staff / HCivil Service fast streamer/ Similar devices) '[]'  - 0 Simple Staple / Suture removal (25 or less) '[]'  - 0 Complex Staple / Suture removal (26 or more) '[]'  - 0 Hypo/Hyperglycemic Management (do not check if billed separately) '[]'  - 0 Ankle / Brachial Index (ABI) - do not check if billed separately Has the patient been seen at the hospital within the last three years: Yes Total Score: 0 Level Of Care: ____ Janet [name] ([id number] Electronic Signature(s) Signed: 12/05/2020 10:03:00 AM By: BDonnamarie PoagEntered By: BDonnamarie Poagon  12/05/2020 08:57:20 [name], [name] ([id number]) -------------------------------------------------------------------------------- Compression Therapy Details Patient Name: Janet [name], Janet [name]. Date of Service: 12/05/2020 8:30 AM Medical Record Number: [id number] Patient Account Number: [id number] Date of Birth/Sex: October 15, 1950 (70 y.o. F) Treating RN: Donnamarie Poag Primary Care Syvilla Martin: Delight Stare Other Clinician: Referring Treshaun Carrico: Delight Stare Treating Colleen Donahoe/Extender: Skipper Cliche in Treatment: 14 Compression Therapy Performed for Wound Assessment: Wound #1 Left,Distal,Anterior Lower Leg Performed By: Junius Argyle, RN Compression Type: Three Layer Post Procedure Diagnosis Same as Pre-procedure Electronic Signature(s) Signed: 12/05/2020 10:03:00 AM By: Donnamarie Poag Entered By: Donnamarie Poag on 12/05/2020 08:55:55 [name], [name] ([id number]) -------------------------------------------------------------------------------- Encounter Discharge Information Details Patient Name: Janet [name]. Date of Service: 12/05/2020 8:30 AM Medical Record Number: [id number] Patient Account Number: [id number] Date of Birth/Sex: 02-06-1950 (70 y.o. F) Treating RN: Donnamarie Poag Primary Care Winferd Wease: Delight Stare Other Clinician: Referring Zain Lankford: Delight Stare Treating Rita Prom/Extender: Skipper Cliche in Treatment: 14 Encounter Discharge Information Items Discharge Condition: Stable Ambulatory Status: Ambulatory Discharge Destination: Home Transportation: Private Auto Accompanied By: self Schedule Follow-up Appointment: Yes Clinical Summary of Care: Electronic Signature(s) Signed: 12/05/2020 10:03:00 AM By: Donnamarie Poag Entered By: Donnamarie Poag on 12/05/2020 09:07:21 [name], [name]  ([id number]) -------------------------------------------------------------------------------- Lower Extremity Assessment Details Patient Name: Janet [name]. Date of Service: 12/05/2020 8:30 AM Medical Record Number: [id number] Patient Account Number: [id number] Date of Birth/Sex: 09-Jul-1950 (70 y.o. F) Treating RN: Donnamarie Poag Primary Care Auden Wettstein: Delight Stare Other Clinician: Referring Baltasar Twilley: Delight Stare Treating Jaydn Fincher/Extender: Skipper Cliche in Treatment: 14 Edema Assessment Assessed: [Left: Yes] [Right: No] Edema: [Left: N] [Right: o] Calf Left: Right: Point of Measurement: 29 cm From Medial Instep 38.5 cm Ankle Left: Right: Point of Measurement: 10 cm From Medial Instep 24 cm Knee To Floor Left: Right: From Medial Instep 43 cm Vascular Assessment Pulses: Dorsalis Pedis Palpable: [Left:Yes] Electronic Signature(s) Signed: 12/05/2020 10:03:00 AM By: Donnamarie Poag Entered By: Donnamarie Poag on 12/05/2020 08:47:34 Kisling, Kema M. ([id number]) -------------------------------------------------------------------------------- Multi Wound Chart Details Patient Name: Janet [name]. Date of Service: 12/05/2020 8:30 AM Medical Record Number: [id number] Patient Account Number: [id number] Date of Birth/Sex: 1950/02/22 (70 y.o. F) Treating RN: Donnamarie Poag Primary Care Zarion Oliff: Delight Stare Other Clinician: Referring Mainor Hellmann: Delight Stare Treating Zanna Hawn/Extender: Skipper Cliche in Treatment: 14 Vital Signs Height(in): 66 Pulse(bpm): 75 Weight(lbs): 192 Blood Pressure(mmHg): 145/84 Body Mass Index(BMI): 31 Temperature(F): 98.5 Respiratory Rate(breaths/min): 16 Photos: [N/A:N/A] Wound Location: Left, Distal, Anterior Lower Leg N/A N/A Wounding Event: Blister N/A N/A Primary Etiology: Venous Leg Ulcer N/A N/A Secondary Etiology: Lymphedema N/A N/A Comorbid History: Asthma, Hypertension N/A N/A Date Acquired: 05/15/2020 N/A N/A Weeks of Treatment: 14 N/A N/A Wound  Status: Open N/A N/A Measurements L x W x D (cm) 2.4x1x0.1 N/A N/A Area (cm) : 1.885 N/A N/A Volume (cm) : 0.188 N/A N/A % Reduction in Area: 68.40% N/A N/A % Reduction in Volume: 68.50% N/A N/A Classification: Full Thickness Without Exposed N/A N/A Support Structures Exudate Amount: Medium N/A N/A Exudate Type: Serosanguineous N/A N/A Exudate Color: red, brown N/A N/A Granulation Amount: Large (67-100%) N/A N/A Granulation Quality: Red, Hyper-granulation N/A N/A Necrotic Amount: Small (1-33%) N/A N/A Exposed Structures: Fat Layer (Subcutaneous Tissue): N/A N/A Yes Fascia: No Tendon: No Muscle: No Joint: No Bone: No Epithelialization: None N/A N/A Treatment Notes Electronic Signature(s) Signed: 12/05/2020 10:03:00 AM By: Donnamarie Poag Entered By: Donnamarie Poag on 12/05/2020 08:54:56 Stubbe, Ulanda M. ([id number]) -------------------------------------------------------------------------------- Multi-Disciplinary Care Plan Details Patient Name: Janet [name]. Date of Service: 12/05/2020  8:30 AM Medical Record Number: [id number] Patient Account Number: [id number] Date of Birth/Sex: [birth year] (70 y.o. F) Treating RN: Donnamarie Poag Primary Care Newell Wafer: Delight Stare Other Clinician: Referring Darlisa Spruiell: Delight Stare Treating Tasheba Henson/Extender: Skipper Cliche in Treatment: 14 Active Inactive Wound/Skin Impairment Nursing Diagnoses: Impaired tissue integrity Knowledge deficit related to smoking impact on wound healing Knowledge deficit related to ulceration/compromised skin integrity Goals: Patient/caregiver will verbalize understanding of skin care regimen Date Initiated: 08/29/2020 Date Inactivated: 09/20/2020 Target Resolution Date: 09/14/2020 Goal Status: Met Ulcer/skin breakdown will have a volume reduction of 30% by week 4 Date Initiated: 08/29/2020 Date Inactivated: 10/17/2020 Target Resolution Date: 09/29/2020 Goal Status: Met Ulcer/skin breakdown will have a volume  reduction of 50% by week 8 Date Initiated: 08/29/2020 Date Inactivated: 11/14/2020 Target Resolution Date: 10/29/2020 Goal Status: Met Ulcer/skin breakdown will have a volume reduction of 80% by week 12 Date Initiated: 08/29/2020 Target Resolution Date: 11/29/2020 Goal Status: Active Ulcer/skin breakdown will heal within 14 weeks Date Initiated: 08/29/2020 Target Resolution Date: 12/14/2020 Goal Status: Active Interventions: Assess patient/caregiver ability to obtain necessary supplies Assess patient/caregiver ability to perform ulcer/skin care regimen upon admission and as needed Assess ulceration(s) every visit Provide education on ulcer and skin care Notes: Electronic Signature(s) Signed: 12/05/2020 10:03:00 AM By: Donnamarie Poag Entered By: Donnamarie Poag on 12/05/2020 08:47:46 Vanderveen, Sanora M. ([id number]) -------------------------------------------------------------------------------- Pain Assessment Details Patient Name: Janet [name]. Date of Service: 12/05/2020 8:30 AM Medical Record Number: [id number] Patient Account Number: [id number] Date of Birth/Sex: 22-Oct-1950 (70 y.o. F) Treating RN: Donnamarie Poag Primary Care Melik Blancett: Delight Stare Other Clinician: Referring Gearldean Lomanto: Delight Stare Treating Earley Grobe/Extender: Skipper Cliche in Treatment: 14 Active Problems Location of Pain Severity and Description of Pain Patient Has Paino Yes Site Locations Pain Location: Pain in Ulcers Rate the pain. Current Pain Level: 5 Pain Management and Medication Current Pain Management: Electronic Signature(s) Signed: 12/05/2020 10:03:00 AM By: Donnamarie Poag Entered By: Donnamarie Poag on 12/05/2020 08:44:59 Pellman, Amilia M. ([id number]) -------------------------------------------------------------------------------- Patient/Caregiver Education Details Patient Name: Janet [name]. Date of Service: 12/05/2020 8:30 AM Medical Record Number: [id number] Patient Account Number: [id number] Date of  Birth/Gender: Nov 29, 1950 (70 y.o. F) Treating RN: Donnamarie Poag Primary Care Physician: Delight Stare Other Clinician: Referring Physician: Delight Stare Treating Physician/Extender: Skipper Cliche in Treatment: 14 Education Assessment Education Provided To: Patient Education Topics Provided Wound/Skin Impairment: Electronic Signature(s) Signed: 12/05/2020 10:03:00 AM By: Donnamarie Poag Entered By: Donnamarie Poag on 12/05/2020 09:00:55 Macrae, Aaliya M. ([id number]) -------------------------------------------------------------------------------- Wound Assessment Details Patient Name: Janet [name]. Date of Service: 12/05/2020 8:30 AM Medical Record Number: [id number] Patient Account Number: [id number] Date of Birth/Sex: [birth year] (70 y.o. F) Treating RN: Donnamarie Poag Primary Care Cheetara Hoge: Delight Stare Other Clinician: Referring Amandeep Nesmith: Delight Stare Treating Dashonna Chagnon/Extender: Skipper Cliche in Treatment: 14 Wound Status Wound Number: 1 Primary Etiology: Venous Leg Ulcer Wound Location: Left, Distal, Anterior Lower Leg Secondary Etiology: Lymphedema Wounding Event: Blister Wound Status: Open Date Acquired: 05/15/2020 Comorbid History: Asthma, Hypertension Weeks Of Treatment: 14 Clustered Wound: No Photos Wound Measurements Length: (cm) 2.4 Width: (cm) 1 Depth: (cm) 0.1 Area: (cm) 1.885 Volume: (cm) 0.188 % Reduction in Area: 68.4% % Reduction in Volume: 68.5% Epithelialization: None Tunneling: No Undermining: No Wound Description Classification: Full Thickness Without Exposed Support Structu Exudate Amount: Medium Exudate Type: Serosanguineous Exudate Color: red, brown res Foul Odor After Cleansing: No Slough/Fibrino Yes Wound Bed Granulation Amount: Large (67-100%) Exposed Structure Granulation Quality: Red, Hyper-granulation Fascia Exposed: No Necrotic Amount: Small (1-33%) Fat Layer (Subcutaneous Tissue)  Exposed: Yes Necrotic Quality: Adherent Slough Tendon  Exposed: No Muscle Exposed: No Joint Exposed: No Bone Exposed: No Treatment Notes Wound #1 (Lower Leg) Wound Laterality: Left, Anterior, Distal Cleanser Peri-Wound Care Triamcinolone Acetonide Cream, 0.1%, 15 (g) tube Discharge Instruction: apply to WOUND and peri Colonna, Yesha M. ([id number]) Topical Primary Dressing Hydrofera Blue Ready Transfer Foam, 2.5x2.5 (in/in) Discharge Instruction: Apply Hydrofera Blue Ready to wound bed as directed Secondary Dressing Secured With Compression Wrap Profore Lite LF 3 Multilayer Compression [place] Discharge Instruction: Apply 3 multi-layer wrap as prescribed. Compression Stockings Add-Ons Electronic Signature(s) Signed: 12/05/2020 10:03:00 AM By: Donnamarie Poag Entered By: Donnamarie Poag on 12/05/2020 08:46:35 Biber, Hlee M. ([id number]) -------------------------------------------------------------------------------- Vitals Details Patient Name: Janet [name]. Date of Service: 12/05/2020 8:30 AM Medical Record Number: [id number] Patient Account Number: [id number] Date of Birth/Sex: 11/10/1950 (69 y.o. F) Treating RN: Donnamarie Poag Primary Care Rachard Isidro: Delight Stare Other Clinician: Referring Tyner Codner: Delight Stare Treating Pauline Pegues/Extender: Skipper Cliche in Treatment: 14 Vital Signs Time Taken: 08:40 Temperature (F): 98.5 Height (in): 66 Pulse (bpm): 75 Weight (lbs): 192 Respiratory Rate (breaths/min): 16 Body Mass Index (BMI): 31 Blood Pressure (mmHg): 145/84 Reference Range: 80 - 120 mg / dl Electronic Signature(s) Signed: 12/05/2020 10:03:00 AM By: Donnamarie Poag Entered ByDonnamarie Poag on 12/05/2020 08:41:41

## 2020-12-12 ENCOUNTER — Encounter: Payer: Medicare Other | Admitting: Physician Assistant

## 2020-12-13 ENCOUNTER — Ambulatory Visit: Payer: Medicare Other | Admitting: Physician Assistant

## 2020-12-15 ENCOUNTER — Other Ambulatory Visit: Payer: Self-pay

## 2020-12-15 ENCOUNTER — Encounter: Payer: Medicare Other | Attending: Physician Assistant | Admitting: Physician Assistant

## 2020-12-15 DIAGNOSIS — E1122 Type 2 diabetes mellitus with diabetic chronic kidney disease: Secondary | ICD-10-CM | POA: Insufficient documentation

## 2020-12-15 DIAGNOSIS — E11622 Type 2 diabetes mellitus with other skin ulcer: Secondary | ICD-10-CM | POA: Insufficient documentation

## 2020-12-15 DIAGNOSIS — I872 Venous insufficiency (chronic) (peripheral): Secondary | ICD-10-CM | POA: Diagnosis not present

## 2020-12-15 DIAGNOSIS — I890 Lymphedema, not elsewhere classified: Secondary | ICD-10-CM | POA: Diagnosis not present

## 2020-12-15 DIAGNOSIS — I129 Hypertensive chronic kidney disease with stage 1 through stage 4 chronic kidney disease, or unspecified chronic kidney disease: Secondary | ICD-10-CM | POA: Diagnosis not present

## 2020-12-15 DIAGNOSIS — L97822 Non-pressure chronic ulcer of other part of left lower leg with fat layer exposed: Secondary | ICD-10-CM | POA: Insufficient documentation

## 2020-12-15 DIAGNOSIS — N189 Chronic kidney disease, unspecified: Secondary | ICD-10-CM | POA: Diagnosis not present

## 2020-12-15 NOTE — Progress Notes (Signed)
TEVA, BRONKEMA ([id number]) Visit Report for 12/15/2020 Arrival Information Details Patient Name: Janet [name], Janet [name]. Date of Service: 12/15/2020 10:30 AM Medical Record Number: [id number] Patient Account Number: [id number] Date of Birth/Sex: 1950/07/23 (70 y.o. F) Treating RN: Donnamarie Poag Primary Care Chasey Dull: Delight Stare Other Clinician: Referring Cortlandt Capuano: Delight Stare Treating Devory Mckinzie/Extender: Skipper Cliche in Treatment: 15 Visit Information History Since Last Visit Added or deleted any medications: No Patient Arrived: Ambulatory Had a fall or experienced change in No Arrival Time: 10:41 activities of daily living that may affect Accompanied By: self risk of falls: Transfer Assistance: None Hospitalized since last visit: No Patient Identification Verified: Yes Has Dressing in Place as Prescribed: Yes Secondary Verification Process Completed: Yes Has Compression in Place as Prescribed: Yes Patient Requires Transmission-Based No Pain Present Now: Yes Precautions: Patient Has Alerts: Yes Patient Alerts: Patient on Blood Thinner Aspirin 36m AVVS ABI/TBI 7/22 ABI L- 1.15/ R- 1.08 Electronic Signature(s) Signed: 12/15/2020 3:46:30 PM By: BDonnamarie PoagEntered By: BDonnamarie Poagon 12/15/2020 10:46:35 [place] ([id number] -------------------------------------------------------------------------------- Clinic Level of Care Assessment Details Patient Name: Janet [name] Date of Service: 12/15/2020 10:30 AM Medical Record Number: [id number] Account Number: [id number] of Birth/Sex: 2Dec 31, 1952(70y.o. F) Treating RN: BDonnamarie PoagPrimary Care Amjad Fikes: MDelight StareOther Clinician: Referring Eugene Isadore: MDelight StareTreating Aycen Porreca/Extender: SSkipper Clichein Treatment: 15 Clinic Level of Care Assessment Items TOOL 1 Quantity Score '[]'  - Use when EandM and Procedure is performed on INITIAL visit 0 ASSESSMENTS - Nursing Assessment / Reassessment '[]'  - General  Physical Exam (combine w/ comprehensive assessment (listed just below) when performed on new 0 pt. evals) '[]'  - 0 Comprehensive Assessment (HX, ROS, Risk Assessments, Wounds Hx, etc.) ASSESSMENTS - Wound and Skin Assessment / Reassessment '[]'  - Dermatologic / Skin Assessment (not related to wound area) 0 ASSESSMENTS - Ostomy and/or Continence Assessment and Care '[]'  - Incontinence Assessment and Management 0 '[]'  - 0 Ostomy Care Assessment and Management (repouching, etc.) PROCESS - Coordination of Care '[]'  - Simple Patient / Family Education for ongoing care 0 '[]'  - 0 Complex (extensive) Patient / Family Education for ongoing care '[]'  - 0 Staff obtains CProgrammer, systems Records, Test Results / Process Orders '[]'  - 0 Staff telephones HHA, Nursing Homes / Clarify orders / etc '[]'  - 0 Routine Transfer to another Facility (non-emergent condition) '[]'  - 0 Routine Hospital Admission (non-emergent condition) '[]'  - 0 New Admissions / IBiomedical engineer/ Ordering NPWT, Apligraf, etc. '[]'  - 0 Emergency Hospital Admission (emergent condition) PROCESS - Special Needs '[]'  - Pediatric / Minor Patient Management 0 '[]'  - 0 Isolation Patient Management '[]'  - 0 Hearing / Language / Visual special needs '[]'  - 0 Assessment of Community assistance (transportation, D/C planning, etc.) '[]'  - 0 Additional assistance / Altered mentation '[]'  - 0 Support Surface(s) Assessment (bed, cushion, seat, etc.) INTERVENTIONS - Miscellaneous '[]'  - External ear exam 0 '[]'  - 0 Patient Transfer (multiple staff / HCivil Service fast streamer/ Similar devices) '[]'  - 0 Simple Staple / Suture removal (25 or less) '[]'  - 0 Complex Staple / Suture removal (26 or more) '[]'  - 0 Hypo/Hyperglycemic Management (do not check if billed separately) '[]'  - 0 Ankle / Brachial Index (ABI) - do not check if billed separately Has the patient been seen at the hospital within the last three years: Yes Total Score: 0 Level Of Care: ____ Janet [name] ([id number] Electronic Signature(s) Signed: 12/15/2020 3:46:30 PM By: BDonnamarie PoagEntered By: BDonnamarie Poagon  12/15/2020 11:08:07 Snell, Shandricka M. ([id number]) -------------------------------------------------------------------------------- Compression Therapy Details Patient Name: Janet [name]. Date of Service: 12/15/2020 10:30 AM Medical Record Number: [id number] Patient Account Number: [id number] Date of Birth/Sex: 1950-12-02 (70 y.o. F) Treating RN: Donnamarie Poag Primary Care Hermenia Fritcher: Delight Stare Other Clinician: Referring Telly Broberg: Delight Stare Treating Darbi Chandran/Extender: Skipper Cliche in Treatment: 15 Compression Therapy Performed for Wound Assessment: Wound #1 Left,Distal,Anterior Lower Leg Performed By: Junius Argyle, RN Compression Type: Three Layer Post Procedure Diagnosis Same as Pre-procedure Electronic Signature(s) Signed: 12/15/2020 3:46:30 PM By: Donnamarie Poag Entered By: Donnamarie Poag on 12/15/2020 10:58:56 Zawistowski, Analissa M. ([id number]) -------------------------------------------------------------------------------- Encounter Discharge Information Details Patient Name: Janet [name]. Date of Service: 12/15/2020 10:30 AM Medical Record Number: [id number] Patient Account Number: [id number] Date of Birth/Sex: [name] 21, 1950 (70 y.o. F) Treating RN: Donnamarie Poag Primary Care Youssouf Shipley: Delight Stare Other Clinician: Referring Kawthar Ennen: Delight Stare Treating Luie Laneve/Extender: Skipper Cliche in Treatment: 15 Encounter Discharge Information Items Post Procedure Vitals Discharge Condition: Stable Temperature (F): 98.3 Ambulatory Status: Ambulatory Pulse (bpm): 82 Discharge Destination: Home Respiratory Rate (breaths/min): 16 Transportation: Private Auto Blood Pressure (mmHg): 147/73 Accompanied By: self Schedule Follow-up Appointment: Yes Clinical Summary of Care: Electronic Signature(s) Signed: 12/15/2020 3:46:30 PM By: Donnamarie Poag Entered By: Donnamarie Poag on  12/15/2020 11:17:11 Wise, Beautiful M. ([id number]) -------------------------------------------------------------------------------- Lower Extremity Assessment Details Patient Name: Janet [name]. Date of Service: 12/15/2020 10:30 AM Medical Record Number: [id number] Patient Account Number: [id number] Date of Birth/Sex: 02/05/1950 (70 y.o. F) Treating RN: Donnamarie Poag Primary Care Rayanna Matusik: Delight Stare Other Clinician: Referring Sabri Teal: Delight Stare Treating Mykael Batz/Extender: Skipper Cliche in Treatment: 15 Edema Assessment Assessed: [Left: Yes] Patrice Paradise: No] [Left: Edema] [Right: :] Calf Left: Right: Point of Measurement: 29 cm From Medial Instep 38 cm Ankle Left: Right: Point of Measurement: 10 cm From Medial Instep 23.5 cm Knee To Floor Left: Right: From Medial Instep 43 cm Vascular Assessment Pulses: Dorsalis Pedis Palpable: [Left:Yes] Electronic Signature(s) Signed: 12/15/2020 3:46:30 PM By: Donnamarie Poag Entered By: Donnamarie Poag on 12/15/2020 10:55:28 Manley, Jeffie M. ([id number]) -------------------------------------------------------------------------------- Multi Wound Chart Details Patient Name: Janet [name]. Date of Service: 12/15/2020 10:30 AM Medical Record Number: [id number] Patient Account Number: [id number] Date of Birth/Sex: 11/12/1950 (70 y.o. F) Treating RN: Donnamarie Poag Primary Care Oakley Kossman: Delight Stare Other Clinician: Referring Emelyn Roen: Delight Stare Treating Joycelin Radloff/Extender: Skipper Cliche in Treatment: 15 Vital Signs Height(in): 66 Pulse(bpm): 43 Weight(lbs): 192 Blood Pressure(mmHg): 147/73 Body Mass Index(BMI): 31 Temperature(F): 98.3 Respiratory Rate(breaths/min): 16 Photos: [N/A:N/A] Wound Location: Left, Distal, Anterior Lower Leg N/A N/A Wounding Event: Blister N/A N/A Primary Etiology: Venous Leg Ulcer N/A N/A Secondary Etiology: Lymphedema N/A N/A Comorbid History: Asthma, Hypertension N/A N/A Date Acquired: 05/15/2020 N/A N/A Weeks  of Treatment: 15 N/A N/A Wound Status: Open N/A N/A Measurements L x W x D (cm) 1.8x0.8x0.1 N/A N/A Area (cm) : 1.131 N/A N/A Volume (cm) : 0.113 N/A N/A % Reduction in Area: 81.10% N/A N/A % Reduction in Volume: 81.10% N/A N/A Classification: Full Thickness Without Exposed N/A N/A Support Structures Exudate Amount: Medium N/A N/A Exudate Type: Serosanguineous N/A N/A Exudate Color: red, brown N/A N/A Granulation Amount: Large (67-100%) N/A N/A Granulation Quality: Red, Hyper-granulation N/A N/A Necrotic Amount: Small (1-33%) N/A N/A Exposed Structures: Fat Layer (Subcutaneous Tissue): N/A N/A Yes Fascia: No Tendon: No Muscle: No Joint: No Bone: No Epithelialization: None N/A N/A Treatment Notes Electronic Signature(s) Signed: 12/15/2020 3:46:30 PM By: Donnamarie Poag Entered ByDonnamarie Poag on 12/15/2020 10:58:15 [place], [place]  Jerilynn Mages ([id number]) -------------------------------------------------------------------------------- [place] Details Patient Name: VADA, SWIFT. Date of Service: 12/15/2020 10:30 AM Medical Record Number: [id number] Patient Account Number: [id number] Date of Birth/Sex: [birth year] (70 y.o. F) Treating RN: Donnamarie Poag Primary Care Jannie Doyle: Delight Stare Other Clinician: Referring Dejay Kronk: Delight Stare Treating Jaxxen Voong/Extender: Skipper Cliche in Treatment: 15 Active Inactive Wound/Skin Impairment Nursing Diagnoses: Impaired tissue integrity Knowledge deficit related to smoking impact on wound healing Knowledge deficit related to ulceration/compromised skin integrity Goals: Patient/caregiver will verbalize understanding of skin care regimen Date Initiated: 08/29/2020 Date Inactivated: 09/20/2020 Target Resolution Date: 09/14/2020 Goal Status: Met Ulcer/skin breakdown will have a volume reduction of 30% by week 4 Date Initiated: 08/29/2020 Date Inactivated: 10/17/2020 Target Resolution Date: 09/29/2020 Goal Status: Met Ulcer/skin  breakdown will have a volume reduction of 50% by week 8 Date Initiated: 08/29/2020 Date Inactivated: 11/14/2020 Target Resolution Date: 10/29/2020 Goal Status: Met Ulcer/skin breakdown will have a volume reduction of 80% by week 12 Date Initiated: 08/29/2020 Target Resolution Date: 11/29/2020 Goal Status: Active Ulcer/skin breakdown will heal within 14 weeks Date Initiated: 08/29/2020 Target Resolution Date: 12/14/2020 Goal Status: Active Interventions: Assess patient/caregiver ability to obtain necessary supplies Assess patient/caregiver ability to perform ulcer/skin care regimen upon admission and as needed Assess ulceration(s) every visit Provide education on ulcer and skin care Notes: Electronic Signature(s) Signed: 12/15/2020 3:46:30 PM By: Donnamarie Poag Entered By: Donnamarie Poag on 12/15/2020 10:56:33 Schremp, Omega M. ([id number]) -------------------------------------------------------------------------------- Pain Assessment Details Patient Name: Janet [name]. Date of Service: 12/15/2020 10:30 AM Medical Record Number: [id number] Patient Account Number: [id number] Date of Birth/Sex: May 10, 1950 (70 y.o. F) Treating RN: Donnamarie Poag Primary Care Leira Regino: Delight Stare Other Clinician: Referring Latarsha Zani: Delight Stare Treating Dewey Viens/Extender: Skipper Cliche in Treatment: 15 Active Problems Location of Pain Severity and Description of Pain Patient Has Paino Yes Site Locations Pain Location: Pain in Ulcers Rate the pain. Current Pain Level: 7 Pain Management and Medication Current Pain Management: Electronic Signature(s) Signed: 12/15/2020 3:46:30 PM By: Donnamarie Poag Entered By: Donnamarie Poag on 12/15/2020 10:52:36 Penkala, Lilyauna M. ([id number]) -------------------------------------------------------------------------------- Patient/Caregiver Education Details Patient Name: Janet [name]. Date of Service: 12/15/2020 10:30 AM Medical Record Number: [id number] Patient Account  Number: [id number] Date of Birth/Gender: [birth year] (70 y.o. F) Treating RN: Donnamarie Poag Primary Care Physician: Delight Stare Other Clinician: Referring Physician: Delight Stare Treating Physician/Extender: Skipper Cliche in Treatment: 15 Education Assessment Education Provided To: Patient Education Topics Provided Wound/Skin Impairment: Electronic Signature(s) Signed: 12/15/2020 3:46:30 PM By: Donnamarie Poag Entered By: Donnamarie Poag on 12/15/2020 11:08:35 Prete, Gwynne M. ([id number]) -------------------------------------------------------------------------------- Wound Assessment Details Patient Name: Janet [name]. Date of Service: 12/15/2020 10:30 AM Medical Record Number: [id number] Patient Account Number: [id number] Date of Birth/Sex: 1950-08-06 (70 y.o. F) Treating RN: Donnamarie Poag Primary Care Perl Kerney: Delight Stare Other Clinician: Referring Firmin Belisle: Delight Stare Treating Rashee Marschall/Extender: Skipper Cliche in Treatment: 15 Wound Status Wound Number: 1 Primary Etiology: Venous Leg Ulcer Wound Location: Left, Distal, Anterior Lower Leg Secondary Etiology: Lymphedema Wounding Event: Blister Wound Status: Open Date Acquired: 05/15/2020 Comorbid History: Asthma, Hypertension Weeks Of Treatment: 15 Clustered Wound: No Photos Wound Measurements Length: (cm) 1.8 Width: (cm) 0.8 Depth: (cm) 0.1 Area: (cm) 1.131 Volume: (cm) 0.113 % Reduction in Area: 81.1% % Reduction in Volume: 81.1% Epithelialization: None Tunneling: No Undermining: No Wound Description Classification: Full Thickness Without Exposed Support Structu Exudate Amount: Medium Exudate Type: Serosanguineous Exudate Color: red, brown res Foul Odor After Cleansing: No Slough/Fibrino Yes Wound Bed Granulation Amount: Large (67-100%) Exposed  Structure Granulation Quality: Red, Hyper-granulation Fascia Exposed: No Necrotic Amount: Small (1-33%) Fat Layer (Subcutaneous Tissue) Exposed: Yes Necrotic Quality:  Adherent Slough Tendon Exposed: No Muscle Exposed: No Joint Exposed: No Bone Exposed: No Treatment Notes Wound #1 (Lower Leg) Wound Laterality: Left, Anterior, Distal Cleanser Peri-Wound Care Topical Primary Dressing Mangini, Fotini M. ([id number]) Secondary Dressing Secured With Compression Wrap Profore Lite LF 3 Multilayer Compression Bandaging System Discharge Instruction: Apply 3 multi-layer wrap as prescribed. Compression Stockings Add-Ons Electronic Signature(s) Signed: 12/15/2020 3:46:30 PM By: Donnamarie Poag Entered By: Donnamarie Poag on 12/15/2020 10:54:25 Wilsey, Avonda M. ([id number]) -------------------------------------------------------------------------------- Vitals Details Patient Name: Janet [name]. Date of Service: 12/15/2020 10:30 AM Medical Record Number: [id number] Patient Account Number: [id number] Date of Birth/Sex: [birth year] (70 y.o. F) Treating RN: Donnamarie Poag Primary Care Deondrea Markos: Delight Stare Other Clinician: Referring Camdon Saetern: Delight Stare Treating Mikhayla Phillis/Extender: Skipper Cliche in Treatment: 15 Vital Signs Time Taken: 10:45 Temperature (F): 98.3 Height (in): 66 Pulse (bpm): 82 Weight (lbs): 192 Respiratory Rate (breaths/min): 16 Body Mass Index (BMI): 31 Blood Pressure (mmHg): 147/73 Reference Range: 80 - 120 mg / dl Electronic Signature(s) Signed: 12/15/2020 3:46:30 PM By: Donnamarie Poag Entered ByDonnamarie Poag on 12/15/2020 10:52:27

## 2020-12-16 NOTE — Progress Notes (Signed)
Janet [name] ([id number]) Visit Report for 12/15/2020 Chief Complaint Document Details Patient Name: Janet [name], Janet [name]. Date of Service: 12/15/2020 10:30 AM Medical Record Number: [id number] Patient Account Number: [id number] Date of Birth/Sex: [birth year] (70 y.o. F) Treating RN: Donnamarie Poag Primary Care Provider: Delight Stare Other Clinician: Referring Provider: Delight Stare Treating Provider/Extender: Skipper Cliche in Treatment: 15 Information Obtained from: Patient Chief Complaint Left LE Ulcers Electronic Signature(s) Signed: 12/15/2020 11:12:30 AM By: Worthy Keeler PA-C Entered By: Worthy Keeler on 12/15/2020 11:12:29 Mcduffee, Elon M. ([id number]) -------------------------------------------------------------------------------- Cellular or Tissue Based Product Details Patient Name: Janet [name]. Date of Service: 12/15/2020 10:30 AM Medical Record Number: [id number] Patient Account Number: [id number] Date of Birth/Sex: [birth year] (70 y.o. F) Treating RN: Donnamarie Poag Primary Care Provider: Delight Stare Other Clinician: Referring Provider: Delight Stare Treating Provider/Extender: Skipper Cliche in Treatment: 15 Cellular or Tissue Based Product Type Wound #1 Left,Distal,Anterior Lower Leg Applied to: Performed By: Physician Tommie Sams., PA-C Cellular or Tissue Based Product Apligraf Type: Level of Consciousness (Pre- Awake and Alert procedure): Pre-procedure Verification/Time Out Yes - 10:59 Taken: Location: trunk / arms / legs Wound Size (sq cm): 1.44 Product Size (sq cm): 44 Waste Size (sq cm): 33 Waste Reason: wound size Amount of Product Applied (sq cm): 11 Instrument Used: Forceps, Scissors Lot #: [id number] Expiration Date: 12/27/2020 Fenestrated: Yes Instrument: Blade Reconstituted: Yes Solution Type: NaCL Solution Amount: 10 mL Lot #: [id number] Solution Expiration Date: 06/16/2022 Secured: Yes Secured With: Steri-Strips Dressing Applied: Yes Primary  Dressing: mepitel Response to Treatment: Procedure was tolerated well Level of Consciousness (Post- Awake and Alert procedure): Post Procedure Diagnosis Same as Pre-procedure Electronic Signature(s) Signed: 12/15/2020 3:46:30 PM By: Donnamarie Poag Entered By: Donnamarie Poag on 12/15/2020 11:05:52 [name], [name] ([id number]) -------------------------------------------------------------------------------- HPI Details Patient Name: Janet [name]. Date of Service: 12/15/2020 10:30 AM Medical Record Number: [id number] Patient Account Number: [id number] Date of Birth/Sex: Aug 06, 1950 (70 y.o. F) Treating RN: Donnamarie Poag Primary Care Provider: Delight Stare Other Clinician: Referring Provider: Delight Stare Treating Provider/Extender: Skipper Cliche in Treatment: 15 History of Present Illness HPI Description: 08/29/2020 upon evaluation today patient appears to be doing somewhat poorly in regard to what appeared to be venous leg ulcers on the left leg that she has been dealing with since around the beginning of May. Fortunately she does not have any signs of obvious infection unfortunately she does have quite a bit of necrotic tissue that does not really need to be debrided. She has been seeing vascular and they did wrap her for a time with what was likely an Haematologist. With that being said the [facility] boot has given her some trouble as far as feeling claustrophobic was concerned she is not sure if she is good to be able to tolerate a compression wrap currently although we will get a try she is in agreement with Aleve given this shot. Fortunately I do not see any signs of active infection systemically which is great news and even locally she seems to be doing quite well today. Patient's Right ABI is 1.08 with a TBI of 0.79 and the left ABI is 1.15 with a TBI of 0.84 on 08/03/20. She also has a history of chronic venous insufficiency and lymphedema but otherwise no major medical problems. 09/05/2020 upon  evaluation today patient unfortunately appears to be having some issues today with her right leg this is completely different from what we have been taking care of on the left. She  actually has some swelling and an area of erythema and pain which is in the medial calf region. There is no identifiable abscess definitely this could just be cellulitis but with her history of DVT I think that we need to have this checked out. 09/12/2020 upon evaluation today patient appears to be doing better in regard to her wound. She has been tolerating the dressing changes without complication. Fortunately there does not appear to be any signs of active infection which is great news and overall I think that the patient is doing well in this regard. Her wounds are measuring smaller at both locations and the surface of the wound is looking better. With that being said she does have a wedding that she is supposed to be going to this weekend and states that she really needs to not have the wrap on for that. Nonetheless regular and discuss options to try to help maintain while she has this time with the wedding. 09/20/2020 upon evaluation today patient unfortunately is doing worse in regard to the pain associated with her wound at this time. Fortunately there does not appear to be any evidence of active infection systemically which is great news but in general I feel like locally she may definitely be infected. Subsequently I do believe that we need to address this as soon as possible. 09/26/2020 upon evaluation today patient appears to be doing well with regard to her wound. Fortunately there does not appear to be any signs of worsening in general. I think that overall the patient is making wonderful progress in my opinion. The right leg where she had an area of erythema medially also is better today which is great news. I think Levaquin has done a great job in that regard. We did see evidence still some erythema on the right  leg but again this is significantly improved compared to last week. Overall the Levaquin has done a great job in this regard. 10/03/2020 upon evaluation today patient appears to be doing well all things considered with regard to her wounds. There does not appear to be any signs of infection obvious at this point which is great news. Fortunately I think that she is definitely making progress with regard to the appearance of the wound bed but nonetheless is still having quite a bit of an issue here with the pain aspect. Fortunately I think that we have some other options to consider him and use triamcinolone underneath the [facility] today. Also think looking into PuraPly/Apligraf as a treatment could be appropriate for this patient and my hope is that we will be able to get the wounds growing in a much faster pace as far as new tissue is concerned to get this closed sooner rather than later as that is the only way that we will get a get the pain completely resolved for her which is ultimately the goal we have as well obviously. 9/26; 2 wounds on the left anterior lower leg in the setting of what appears to be severe chronic venous insufficiency. We have been using Hydrofera Blue and 3 layer compression. 10/17/2020 upon evaluation today patient appears to be doing well with regard to her leg ulcer. She has been tolerating the dressing changes without complication. Fortunately there does not appear to be any signs of active infection at this time. No fevers, chills, nausea, vomiting, or diarrhea. 10/10; 2 wounds on the left lateral calf. She has been using Hydrofera Blue. Still complains of pain. She is on her  second week of that was started last week. 10/31/2020 upon evaluation today patient's wound bed actually showed signs of good granulation and epithelization at this point. Fortunately there does not appear to be any signs of active infection which is great news and overall very pleased with  where we stand. I do think that the patient is making excellent progress based on what we are seeing I believe the triamcinolone along with a Hydrofera Blue is doing a great job. 11/07/2020 upon evaluation today patient appears to be doing well with regard to her wounds. This is going slowly but nonetheless I believe that the fact that she is responding to the triamcinolone and is doing better is a good sign I also believe this is a sign that were definitely dealing with more of an inflammatory process. Nonetheless I am extremely happy with where things stand today. 11/14/2020 upon evaluation today patient appears to be doing well with regard to her wounds. The more superior wound actually appears to be almost completely closed. The inferior area is measuring smaller but still open she still has a fairly significant amount of pain. 11/21/2020 upon inspection patient's wound bed actually showed signs of good granulation epithelization at this point. Fortunately there does not appear to be any evidence of active infection systemically which is great news and overall I am actually extremely pleased with where we stand. I do believe that the patient is making good progress in fact the wound seem to be showing signs of shrinking week by week and the progress is fairly dramatic. The proximal wound does appear to likely be healed today. 11/28/2020 upon inspection patient's wound bed actually showed signs of good granulation epithelization at this point. Fortunately I do not see any evidence of infection currently which is great news and overall I think that she is making excellent progress. CHINELO, BENN ([id number]) 12/05/2020 upon evaluation today patient appears to be doing well with regard to her leg ulcer. I am definitely showing signs of improvement here which is great news. I do not see any evidence of infection and I think we are headed in the right direction. With that being said I think the patient  could be approved and evaluated for Apligraf if approved and we could see about what we could do in regard to getting this applied even for next week. Again some of this will depend on timing being that it is a holiday may have to be the following week. Either way I do think this could be beneficial in trying to help get this healed much more effectively and quickly. The patient is in agreement with giving this a trial. 12/15/2020 upon evaluation today patient appears to be doing decently well in regard to her wound. I am seeing some signs of improvement. I am hopeful that the Apligraf will help this distillate much more effectively and quickly. She is making progress but is been a very slow progress up to this point. Electronic Signature(s) Signed: 12/15/2020 4:56:13 PM By: Worthy Keeler PA-C Entered By: Worthy Keeler on 12/15/2020 16:56:13 [name], [name] ([id number]) -------------------------------------------------------------------------------- Physical Exam Details Patient Name: Janet [name]. Date of Service: 12/15/2020 10:30 AM Medical Record Number: [id number] Patient Account Number: [id number] Date of Birth/Sex: Feb 06, 1950 (70 y.o. F) Treating RN: Donnamarie Poag Primary Care Provider: Delight Stare Other Clinician: Referring Provider: Delight Stare Treating Provider/Extender: Skipper Cliche in Treatment: 39 Constitutional Well-nourished and well-hydrated in no acute distress. Respiratory normal breathing without difficulty. Psychiatric this  patient is able to make decisions and demonstrates good insight into disease process. Alert and Oriented x 3. pleasant and cooperative. Notes Upon inspection patient's wound again did have some slough noted on the surface of the wound which I used saline and gauze to clear away this hurt her fairly significantly she tells me again I was trying to be as careful as possible. Nonetheless we had to get this off in order to apply the Apligraf. I then  went forward with a little bit of numbing medication over this area to help calm down the pain and then following that white that off before applying the Apligraf which I secured with Steri-Strips and Mepitel. Electronic Signature(s) Signed: 12/15/2020 4:56:50 PM By: Worthy Keeler PA-C Entered By: Worthy Keeler on 12/15/2020 16:56:50 [name], [name] ([id number]) -------------------------------------------------------------------------------- Physician Orders Details Patient Name: Janet [name]. Date of Service: 12/15/2020 10:30 AM Medical Record Number: [id number] Patient Account Number: [id number] Date of Birth/Sex: [birth year] (70 y.o. F) Treating RN: Donnamarie Poag Primary Care Provider: Delight Stare Other Clinician: Referring Provider: Delight Stare Treating Provider/Extender: Skipper Cliche in Treatment: 15 Verbal / Phone Orders: No Diagnosis Coding Follow-up Appointments o Return Appointment in 1 week. o Nurse Visit as needed Bathing/ Shower/ Hygiene o May shower with wound dressing protected with water repellent cover or cast protector. o No tub bath. Anesthetic (Use 'Patient Medications' Section for Anesthetic Order Entry) o Lidocaine applied to wound bed Cellular or Tissue Based Products o Cellular or Tissue Based Product applied to wound bed; including contact layer, fixation with steri-strips, dry gauze and cover dressing. (DO NOT REMOVE). - Apigraf #1 Edema Control - Lymphedema / Segmental Compressive Device / Other o Optional: One layer of unna paste to top of compression wrap (to act as an anchor). - left leg o Patient to wear own compression stockings. Remove compression stockings every night before going to bed and put on every morning when getting up. - right leg-see [employer] contact and measurements Use on RIGHT leg o Patient to wear own Velcro compression garment. Remove compression stockings every night before going to bed and put on every  morning when getting up. - will be mailed to you, "[facility]"-will let you know when to bring to appt o Elevate legs to the level of the heart and pump ankles as often as possible o Elevate leg(s) parallel to the floor when sitting. o DO YOUR BEST to sleep in the bed at night. DO NOT sleep in your recliner. Long hours of sitting in a recliner leads to swelling of the legs and/or potential wounds on your backside. Additional Orders / Instructions o Follow Nutritious Diet and Increase Protein Intake Medications-Please add to medication list. o Take one 500mg  Tylenol (Acetaminophen) and one 200mg  Motrin (Ibuprofen) every 6 hours for pain. Do not take ibuprofen if you are on blood thinners or have stomach ulcers. - as needed for pain if not contraindicated by other physician Wound Treatment Wound #1 - Lower Leg Wound Laterality: Left, Anterior, Distal Compression Wrap: Profore Lite LF 3 Multilayer Compression Bandaging System 1 x Per Week/30 Days Discharge Instructions: Apply 3 multi-layer wrap as prescribed. Electronic Signature(s) Signed: 12/15/2020 3:46:30 PM By: Donnamarie Poag Signed: 12/15/2020 5:34:12 PM By: Worthy Keeler PA-C Entered By: Donnamarie Poag on 12/15/2020 11:07:56 [name], [name] ([id number]) -------------------------------------------------------------------------------- Problem List Details Patient Name: Janet [name]. Date of Service: 12/15/2020 10:30 AM Medical Record Number: [id number] Patient Account Number: [id number] Date of Birth/Sex: July 03, 1950 (70 y.o.  F) Treating RN: Donnamarie Poag Primary Care Provider: Delight Stare Other Clinician: Referring Provider: Delight Stare Treating Provider/Extender: Skipper Cliche in Treatment: 15 Active Problems ICD-10 Encounter Code Description Active Date MDM Diagnosis I87.2 Venous insufficiency (chronic) (peripheral) 08/29/2020 No Yes I89.0 Lymphedema, not elsewhere classified 08/29/2020 No Yes L97.822 Non-pressure chronic  ulcer of other part of left lower leg with fat layer 08/29/2020 No Yes exposed Inactive Problems ICD-10 Code Description Active Date Inactive Date L03.115 Cellulitis of right lower limb 09/05/2020 09/05/2020 R22.41 Localized swelling, mass and lump, right lower limb 09/05/2020 09/05/2020 Resolved Problems Electronic Signature(s) Signed: 12/15/2020 11:12:20 AM By: Worthy Keeler PA-C Entered By: Worthy Keeler on 12/15/2020 11:12:20 [name], [name] ([id number]) -------------------------------------------------------------------------------- Progress Note Details Patient Name: Janet [name]. Date of Service: 12/15/2020 10:30 AM Medical Record Number: [id number] Patient Account Number: [id number] Date of Birth/Sex: [birth year] (70 y.o. F) Treating RN: Donnamarie Poag Primary Care Provider: Delight Stare Other Clinician: Referring Provider: Delight Stare Treating Provider/Extender: Skipper Cliche in Treatment: 15 Subjective Chief Complaint Information obtained from Patient Left LE Ulcers History of Present Illness (HPI) 08/29/2020 upon evaluation today patient appears to be doing somewhat poorly in regard to what appeared to be venous leg ulcers on the left leg that she has been dealing with since around the beginning of May. Fortunately she does not have any signs of obvious infection unfortunately she does have quite a bit of necrotic tissue that does not really need to be debrided. She has been seeing vascular and they did wrap her for a time with what was likely an Haematologist. With that being said the [facility] boot has given her some trouble as far as feeling claustrophobic was concerned she is not sure if she is good to be able to tolerate a compression wrap currently although we will get a try she is in agreement with Aleve given this shot. Fortunately I do not see any signs of active infection systemically which is great news and even locally she seems to be doing quite well today. Patient's Right  ABI is 1.08 with a TBI of 0.79 and the left ABI is 1.15 with a TBI of 0.84 on 08/03/20. She also has a history of chronic venous insufficiency and lymphedema but otherwise no major medical problems. 09/05/2020 upon evaluation today patient unfortunately appears to be having some issues today with her right leg this is completely different from what we have been taking care of on the left. She actually has some swelling and an area of erythema and pain which is in the medial calf region. There is no identifiable abscess definitely this could just be cellulitis but with her history of DVT I think that we need to have this checked out. 09/12/2020 upon evaluation today patient appears to be doing better in regard to her wound. She has been tolerating the dressing changes without complication. Fortunately there does not appear to be any signs of active infection which is great news and overall I think that the patient is doing well in this regard. Her wounds are measuring smaller at both locations and the surface of the wound is looking better. With that being said she does have a wedding that she is supposed to be going to this weekend and states that she really needs to not have the wrap on for that. Nonetheless regular and discuss options to try to help maintain while she has this time with the wedding. 09/20/2020 upon evaluation today patient unfortunately is doing  worse in regard to the pain associated with her wound at this time. Fortunately there does not appear to be any evidence of active infection systemically which is great news but in general I feel like locally she may definitely be infected. Subsequently I do believe that we need to address this as soon as possible. 09/26/2020 upon evaluation today patient appears to be doing well with regard to her wound. Fortunately there does not appear to be any signs of worsening in general. I think that overall the patient is making wonderful progress in my  opinion. The right leg where she had an area of erythema medially also is better today which is great news. I think Levaquin has done a great job in that regard. We did see evidence still some erythema on the right leg but again this is significantly improved compared to last week. Overall the Levaquin has done a great job in this regard. 10/03/2020 upon evaluation today patient appears to be doing well all things considered with regard to her wounds. There does not appear to be any signs of infection obvious at this point which is great news. Fortunately I think that she is definitely making progress with regard to the appearance of the wound bed but nonetheless is still having quite a bit of an issue here with the pain aspect. Fortunately I think that we have some other options to consider him and use triamcinolone underneath the [facility] today. Also think looking into PuraPly/Apligraf as a treatment could be appropriate for this patient and my hope is that we will be able to get the wounds growing in a much faster pace as far as new tissue is concerned to get this closed sooner rather than later as that is the only way that we will get a get the pain completely resolved for her which is ultimately the goal we have as well obviously. 9/26; 2 wounds on the left anterior lower leg in the setting of what appears to be severe chronic venous insufficiency. We have been using Hydrofera Blue and 3 layer compression. 10/17/2020 upon evaluation today patient appears to be doing well with regard to her leg ulcer. She has been tolerating the dressing changes without complication. Fortunately there does not appear to be any signs of active infection at this time. No fevers, chills, nausea, vomiting, or diarrhea. 10/10; 2 wounds on the left lateral calf. She has been using Hydrofera Blue. Still complains of pain. She is on her second week of that was started last week. 10/31/2020 upon evaluation today  patient's wound bed actually showed signs of good granulation and epithelization at this point. Fortunately there does not appear to be any signs of active infection which is great news and overall very pleased with where we stand. I do think that the patient is making excellent progress based on what we are seeing I believe the triamcinolone along with a Hydrofera Blue is doing a great job. 11/07/2020 upon evaluation today patient appears to be doing well with regard to her wounds. This is going slowly but nonetheless I believe that the fact that she is responding to the triamcinolone and is doing better is a good sign I also believe this is a sign that were definitely dealing with more of an inflammatory process. Nonetheless I am extremely happy with where things stand today. 11/14/2020 upon evaluation today patient appears to be doing well with regard to her wounds. The more superior wound actually appears to be almost completely  closed. The inferior area is measuring smaller but still open she still has a fairly significant amount of pain. 11/21/2020 upon inspection patient's wound bed actually showed signs of good granulation epithelization at this point. Fortunately there does not appear to be any evidence of active infection systemically which is great news and overall I am actually extremely pleased with where we stand. I do believe that the patient is making good progress in fact the wound seem to be showing signs of shrinking week by week and the progress is [name], [name] ([id number]) fairly dramatic. The proximal wound does appear to likely be healed today. 11/28/2020 upon inspection patient's wound bed actually showed signs of good granulation epithelization at this point. Fortunately I do not see any evidence of infection currently which is great news and overall I think that she is making excellent progress. 12/05/2020 upon evaluation today patient appears to be doing well with regard to  her leg ulcer. I am definitely showing signs of improvement here which is great news. I do not see any evidence of infection and I think we are headed in the right direction. With that being said I think the patient could be approved and evaluated for Apligraf if approved and we could see about what we could do in regard to getting this applied even for next week. Again some of this will depend on timing being that it is a holiday may have to be the following week. Either way I do think this could be beneficial in trying to help get this healed much more effectively and quickly. The patient is in agreement with giving this a trial. 12/15/2020 upon evaluation today patient appears to be doing decently well in regard to her wound. I am seeing some signs of improvement. I am hopeful that the Apligraf will help this distillate much more effectively and quickly. She is making progress but is been a very slow progress up to this point. Objective Constitutional Well-nourished and well-hydrated in no acute distress. Vitals Time Taken: 10:45 AM, Height: 66 in, Weight: 192 lbs, BMI: 31, Temperature: 98.3 F, Pulse: 82 bpm, Respiratory Rate: 16 breaths/min, Blood Pressure: 147/73 mmHg. Respiratory normal breathing without difficulty. Psychiatric this patient is able to make decisions and demonstrates good insight into disease process. Alert and Oriented x 3. pleasant and cooperative. General Notes: Upon inspection patient's wound again did have some slough noted on the surface of the wound which I used saline and gauze to clear away this hurt her fairly significantly she tells me again I was trying to be as careful as possible. Nonetheless we had to get this off in order to apply the Apligraf. I then went forward with a little bit of numbing medication over this area to help calm down the pain and then following that white that off before applying the Apligraf which I secured with Steri-Strips and  Mepitel. Integumentary (Hair, Skin) Wound #1 status is Open. Original cause of wound was Blister. The date acquired was: 05/15/2020. The wound has been in treatment 15 weeks. The wound is located on the [facility] Lower Leg. The wound measures 1.8cm length x 0.8cm width x 0.1cm depth; 1.131cm^2 area and 0.113cm^3 volume. There is Fat Layer (Subcutaneous Tissue) exposed. There is no tunneling or undermining noted. There is a medium amount of serosanguineous drainage noted. There is large (67-100%) red, hyper - granulation within the wound bed. There is a small (1-33%) amount of necrotic tissue within the wound bed including Adherent Slough. Assessment  Active Problems ICD-10 Venous insufficiency (chronic) (peripheral) Lymphedema, not elsewhere classified Non-pressure chronic ulcer of other part of left lower leg with fat layer exposed Procedures Wound #1 Pre-procedure diagnosis of Wound #1 is a Venous Leg Ulcer located on the Left,Distal,Anterior Lower Leg. A skin graft procedure using a bioengineered skin substitute/cellular or tissue based product was performed by Tommie Sams., PA-C with the following instrument(s): Forceps and Scissors. Apligraf was applied and secured with Steri-Strips. 11 sq cm of product was utilized and 33 sq cm was wasted due to wound size. Post Application, mepitel was applied. A Time Out was conducted at 10:59, prior to the start of the procedure. The procedure was tolerated well. [name], Janet [name] ([id number]) Post procedure Diagnosis Wound #1: Same as Pre-Procedure . Pre-procedure diagnosis of Wound #1 is a Venous Leg Ulcer located on the Left,Distal,Anterior Lower Leg . There was a Three Layer Compression Therapy Procedure by Donnamarie Poag, RN. Post procedure Diagnosis Wound #1: Same as Pre-Procedure Plan Follow-up Appointments: Return Appointment in 1 week. Nurse Visit as needed Bathing/ Shower/ Hygiene: May shower with wound dressing protected with  water repellent cover or cast protector. No tub bath. Anesthetic (Use 'Patient Medications' Section for Anesthetic Order Entry): Lidocaine applied to wound bed Cellular or Tissue Based Products: Cellular or Tissue Based Product applied to wound bed; including contact layer, fixation with steri-strips, dry gauze and cover dressing. (DO NOT REMOVE). - Apigraf #1 Edema Control - Lymphedema / Segmental Compressive Device / Other: Optional: One layer of unna paste to top of compression wrap (to act as an anchor). - left leg Patient to wear own compression stockings. Remove compression stockings every night before going to bed and put on every morning when getting up. - right leg-see Elastic Therapy contact and measurements Use on RIGHT leg Patient to wear own Velcro compression garment. Remove compression stockings every night before going to bed and put on every morning when getting up. - will be mailed to you, "[facility]"-will let you know when to bring to appt Elevate legs to the level of the heart and pump ankles as often as possible Elevate leg(s) parallel to the floor when sitting. DO YOUR BEST to sleep in the bed at night. DO NOT sleep in your recliner. Long hours of sitting in a recliner leads to swelling of the legs and/or potential wounds on your backside. Additional Orders / Instructions: Follow Nutritious Diet and Increase Protein Intake Medications-Please add to medication list.: Take one 500mg  Tylenol (Acetaminophen) and one 200mg  Motrin (Ibuprofen) every 6 hours for pain. Do not take ibuprofen if you are on blood thinners or have stomach ulcers. - as needed for pain if not contraindicated by other physician WOUND #1: - Lower Leg Wound Laterality: Left, Anterior, Distal Compression Wrap: Profore Lite LF 3 Multilayer Compression Bandaging System 1 x Per Week/30 Days Discharge Instructions: Apply 3 multi-layer wrap as prescribed. 1. I would recommend that we reevaluated the patient  next week to see where things stand. She is in agreement with that plan. 2. We will plan for Apligraf #2 next week assuming this has not healed significantly between now and then to the point that it does not need it. We will see patient back for reevaluation in 1 week here in the clinic. If anything worsens or changes patient will contact our office for additional recommendations. Electronic Signature(s) Signed: 12/15/2020 4:57:21 PM By: Worthy Keeler PA-C Entered By: Worthy Keeler on 12/15/2020 16:57:21 [name], Zykeriah M. ([id number]) -------------------------------------------------------------------------------- SuperBill  Details Patient Name: Janet [name], Janet [name]. Date of Service: 12/15/2020 Medical Record Number: [id number] Patient Account Number: [id number] Date of Birth/Sex: 07-31-1950 (70 y.o. F) Treating RN: Donnamarie Poag Primary Care Provider: Delight Stare Other Clinician: Referring Provider: Delight Stare Treating Provider/Extender: Skipper Cliche in Treatment: 15 Diagnosis Coding ICD-10 Codes Code Description I87.2 Venous insufficiency (chronic) (peripheral) I89.0 Lymphedema, not elsewhere classified 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 of other part of left lower leg with fat layer exposed Facility Procedures CPT4 Code: 02542706 Description: [id number] (Facility Use Only) Apligraf 44 SQ CM Modifier: Quantity: Cascade Code: [id number] Description: 60737 - SKIN SUB GRAFT TRNK/ARM/LEG Modifier: Quantity: 1 CPT4 Code: Description: ICD-10 Diagnosis Description 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 of other part of left lower leg with fat layer Modifier: exposed Quantity: Physician Procedures CPT4 Code: [id number] Description: 85462 - WC PHYS SKIN SUB GRAFT TRNK/ARM/LEG Modifier: Quantity: 1 CPT4 Code: Description: ICD-10 Diagnosis Description 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 of other part of left lower leg with fat layer e Modifier: xposed Quantity: Electronic Signature(s) Signed:  12/15/2020 4:57:43 PM By: Worthy Keeler PA-C Previous Signature: 12/15/2020 3:46:30 PM Version By: Donnamarie Poag Entered By: Worthy Keeler on 12/15/2020 16:57:43

## 2020-12-23 ENCOUNTER — Encounter: Payer: Medicare Other | Admitting: Physician Assistant

## 2020-12-23 ENCOUNTER — Other Ambulatory Visit: Payer: Self-pay

## 2020-12-23 DIAGNOSIS — E11622 Type 2 diabetes mellitus with other skin ulcer: Secondary | ICD-10-CM | POA: Diagnosis not present

## 2020-12-23 NOTE — Progress Notes (Addendum)
CHEYNA, RETANA ([id number]) Visit Report for 12/23/2020 Chief Complaint Document Details Patient Name: Janet [name], Janet [name]. Date of Service: 12/23/2020 8:30 AM Medical Record Number: [id number] Patient Account Number: [id number] Date of Birth/Sex: 10-[name]-1951 (70 y.o. F) Treating RN: Donnamarie Poag Primary Care Provider: Delight Stare Other Clinician: Referring Provider: Delight Stare Treating Provider/Extender: Skipper Cliche in Treatment: 16 Information Obtained from: Patient Chief Complaint Left LE Ulcers Electronic Signature(s) Signed: 12/23/2020 8:45:54 AM By: Worthy Keeler PA-C Entered By: Worthy Keeler on 12/23/2020 08:45:54 Janet [name], [name] ([id number]) -------------------------------------------------------------------------------- Cellular or Tissue Based Product Details Patient Name: Janet [name]. Date of Service: 12/23/2020 8:30 AM Medical Record Number: [id number] Patient Account Number: [id number] Date of Birth/Sex: 06-12-1950 (70 y.o. F) Treating RN: Donnamarie Poag Primary Care Provider: Delight Stare Other Clinician: Referring Provider: Delight Stare Treating Provider/Extender: Skipper Cliche in Treatment: 16 Cellular or Tissue Based Product Type Wound #1 Left,Distal,Anterior Lower Leg Applied to: Performed By: Physician Tommie Sams., PA-C Cellular or Tissue Based Product Apligraf Type: Level of Consciousness (Pre- Awake and Alert procedure): Pre-procedure Verification/Time Out Yes - 08:36 Taken: Location: trunk / arms / legs Wound Size (sq cm): 1.19 Product Size (sq cm): 44 Waste Size (sq cm): 33 Waste Reason: wound size Amount of Product Applied (sq cm): 11 Instrument Used: Forceps, Scissors Lot #: [id number] Expiration Date: 01/04/2021 Fenestrated: Yes Instrument: Blade Reconstituted: Yes Solution Type: NaCL Solution Amount: 5 ml Lot #: [id number] Solution Expiration Date: 06/16/2022 Secured: Yes Secured With: Steri-Strips Dressing Applied: Yes Primary  Dressing: mepitel Response to Treatment: Procedure was tolerated well Level of Consciousness (Post- Awake and Alert procedure): Post Procedure Diagnosis Same as Pre-procedure Electronic Signature(s) Signed: 12/23/2020 9:00:41 AM By: Donnamarie Poag Entered By: Donnamarie Poag on 12/23/2020 08:40:46 Janet [name], [name] ([id number]) -------------------------------------------------------------------------------- HPI Details Patient Name: Janet [name]. Date of Service: 12/23/2020 8:30 AM Medical Record Number: [id number] Patient Account Number: [id number] Date of Birth/Sex: [birth year] (70 y.o. F) Treating RN: Donnamarie Poag Primary Care Provider: Delight Stare Other Clinician: Referring Provider: Delight Stare Treating Provider/Extender: Skipper Cliche in Treatment: 16 History of Present Illness HPI Description: 08/29/2020 upon evaluation today patient appears to be doing somewhat poorly in regard to what appeared to be venous leg ulcers on the left leg that she has been dealing with since around the beginning of May. Fortunately she does not have any signs of obvious infection unfortunately she does have quite a bit of necrotic tissue that does not really need to be debrided. She has been seeing vascular and they did wrap her for a time with what was likely an Haematologist. With that being said the [facility] boot has given her some trouble as far as feeling claustrophobic was concerned she is not sure if she is good to be able to tolerate a compression wrap currently although we will get a try she is in agreement with Aleve given this shot. Fortunately I do not see any signs of active infection systemically which is great news and even locally she seems to be doing quite well today. Patient's Right ABI is 1.08 with a TBI of 0.79 and the left ABI is 1.15 with a TBI of 0.84 on 08/03/20. She also has a history of chronic venous insufficiency and lymphedema but otherwise no major medical problems. 09/05/2020 upon  evaluation today patient unfortunately appears to be having some issues today with her right leg this is completely different from what we have been taking care of on the left. She  actually has some swelling and an area of erythema and pain which is in the medial calf region. There is no identifiable abscess definitely this could just be cellulitis but with her history of DVT I think that we need to have this checked out. 09/12/2020 upon evaluation today patient appears to be doing better in regard to her wound. She has been tolerating the dressing changes without complication. Fortunately there does not appear to be any signs of active infection which is great news and overall I think that the patient is doing well in this regard. Her wounds are measuring smaller at both locations and the surface of the wound is looking better. With that being said she does have a wedding that she is supposed to be going to this weekend and states that she really needs to not have the wrap on for that. Nonetheless regular and discuss options to try to help maintain while she has this time with the wedding. 09/20/2020 upon evaluation today patient unfortunately is doing worse in regard to the pain associated with her wound at this time. Fortunately there does not appear to be any evidence of active infection systemically which is great news but in general I feel like locally she may definitely be infected. Subsequently I do believe that we need to address this as soon as possible. 09/26/2020 upon evaluation today patient appears to be doing well with regard to her wound. Fortunately there does not appear to be any signs of worsening in general. I think that overall the patient is making wonderful progress in my opinion. The right leg where she had an area of erythema medially also is better today which is great news. I think Levaquin has done a great job in that regard. We did see evidence still some erythema on the right  leg but again this is significantly improved compared to last week. Overall the Levaquin has done a great job in this regard. 10/03/2020 upon evaluation today patient appears to be doing well all things considered with regard to her wounds. There does not appear to be any signs of infection obvious at this point which is great news. Fortunately I think that she is definitely making progress with regard to the appearance of the wound bed but nonetheless is still having quite a bit of an issue here with the pain aspect. Fortunately I think that we have some other options to consider him and use triamcinolone underneath the [facility] today. Also think looking into PuraPly/Apligraf as a treatment could be appropriate for this patient and my hope is that we will be able to get the wounds growing in a much faster pace as far as new tissue is concerned to get this closed sooner rather than later as that is the only way that we will get a get the pain completely resolved for her which is ultimately the goal we have as well obviously. 9/26; 2 wounds on the left anterior lower leg in the setting of what appears to be severe chronic venous insufficiency. We have been using Hydrofera Blue and 3 layer compression. 10/17/2020 upon evaluation today patient appears to be doing well with regard to her leg ulcer. She has been tolerating the dressing changes without complication. Fortunately there does not appear to be any signs of active infection at this time. No fevers, chills, nausea, vomiting, or diarrhea. 10/10; 2 wounds on the left lateral calf. She has been using Hydrofera Blue. Still complains of pain. She is on her  second week of that was started last week. 10/31/2020 upon evaluation today patient's wound bed actually showed signs of good granulation and epithelization at this point. Fortunately there does not appear to be any signs of active infection which is great news and overall very pleased with  where we stand. I do think that the patient is making excellent progress based on what we are seeing I believe the triamcinolone along with a Hydrofera Blue is doing a great job. 11/07/2020 upon evaluation today patient appears to be doing well with regard to her wounds. This is going slowly but nonetheless I believe that the fact that she is responding to the triamcinolone and is doing better is a good sign I also believe this is a sign that were definitely dealing with more of an inflammatory process. Nonetheless I am extremely happy with where things stand today. 11/14/2020 upon evaluation today patient appears to be doing well with regard to her wounds. The more superior wound actually appears to be almost completely closed. The inferior area is measuring smaller but still open she still has a fairly significant amount of pain. 11/21/2020 upon inspection patient's wound bed actually showed signs of good granulation epithelization at this point. Fortunately there does not appear to be any evidence of active infection systemically which is great news and overall I am actually extremely pleased with where we stand. I do believe that the patient is making good progress in fact the wound seem to be showing signs of shrinking week by week and the progress is fairly dramatic. The proximal wound does appear to likely be healed today. 11/28/2020 upon inspection patient's wound bed actually showed signs of good granulation epithelization at this point. Fortunately I do not see any evidence of infection currently which is great news and overall I think that she is making excellent progress. Janet [name], Janet [name] ([id number]) 12/05/2020 upon evaluation today patient appears to be doing well with regard to her leg ulcer. I am definitely showing signs of improvement here which is great news. I do not see any evidence of infection and I think we are headed in the right direction. With that being said I think the patient  could be approved and evaluated for Apligraf if approved and we could see about what we could do in regard to getting this applied even for next week. Again some of this will depend on timing being that it is a holiday may have to be the following week. Either way I do think this could be beneficial in trying to help get this healed much more effectively and quickly. The patient is in agreement with giving this a trial. 12/15/2020 upon evaluation today patient appears to be doing decently well in regard to her wound. I am seeing some signs of improvement. I am hopeful that the Apligraf will help this distillate much more effectively and quickly. She is making progress but is been a very slow progress up to this point. 12/23/2020 upon evaluation today patient actually appears to be doing excellent in regard to her wound on the leg this is very close to complete resolution I think the Apligraf is doing an awesome job for her to be honest. This was her first application today is the second. Electronic Signature(s) Signed: 12/23/2020 5:56:43 PM By: Worthy Keeler PA-C Entered By: Worthy Keeler on 12/23/2020 17:56:43 Janet [name], [name] ([id number]) -------------------------------------------------------------------------------- Physical Exam Details Patient Name: Janet [name]. Date of Service: 12/23/2020 8:30 AM Medical Record Number: [id number]  Patient Account Number: [id number] Date of Birth/Sex: 02/24/1950 (70 y.o. F) Treating RN: Donnamarie Poag Primary Care Provider: Delight Stare Other Clinician: Referring Provider: Delight Stare Treating Provider/Extender: Skipper Cliche in Treatment: 80 Constitutional Well-nourished and well-hydrated in no acute distress. Respiratory normal breathing without difficulty. Psychiatric this patient is able to make decisions and demonstrates good insight into disease process. Alert and Oriented x 3. pleasant and cooperative. Notes Upon inspection patient's wound  bed did not require any sharp debridement which is great news and overall I think that she is really doing quite well based on what I see currently. No fevers, chills, nausea, vomiting, or diarrhea. I therefore did actually go ahead and reapply the second application of Apligraf today and she was secured with Steri-Strips and Mepitel. Electronic Signature(s) Signed: 12/23/2020 5:57:11 PM By: Worthy Keeler PA-C Entered By: Worthy Keeler on 12/23/2020 17:57:11 Janet [name], [name] ([id number]) -------------------------------------------------------------------------------- Physician Orders Details Patient Name: Janet [name]. Date of Service: 12/23/2020 8:30 AM Medical Record Number: [id number] Patient Account Number: [id number] Date of Birth/Sex: 04-29-1950 (70 y.o. F) Treating RN: Donnamarie Poag Primary Care Provider: Delight Stare Other Clinician: Referring Provider: Delight Stare Treating Provider/Extender: Skipper Cliche in Treatment: [phone] Verbal / Phone Orders: No Diagnosis Coding ICD-10 Coding Code Description I87.2 Venous insufficiency (chronic) (peripheral) I89.0 Lymphedema, not elsewhere classified 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 of other part of left lower leg with fat layer exposed Follow-up Appointments o Return Appointment in 1 week. o Nurse Visit as needed Bathing/ Shower/ Hygiene o May shower with wound dressing protected with water repellent cover or cast protector. o No tub bath. Anesthetic (Use 'Patient Medications' Section for Anesthetic Order Entry) o Lidocaine applied to wound bed Cellular or Tissue Based Products o Cellular or Tissue Based Product applied to wound bed; including contact layer, fixation with steri-strips, dry gauze and cover dressing. (DO NOT REMOVE). - Apigraf #2 Edema Control - Lymphedema / Segmental Compressive Device / Other o Optional: One layer of unna paste to top of compression wrap (to act as an anchor). - left leg o Patient  to wear own compression stockings. Remove compression stockings every night before going to bed and put on every morning when getting up. - right leg-see Elastic Therapy contact and measurements Use on RIGHT leg o Patient to wear own Velcro compression garment. Remove compression stockings every night before going to bed and put on every morning when getting up. - "JUXTELITE"-will let you know when to bring to appt o Elevate legs to the level of the heart and pump ankles as often as possible o Elevate leg(s) parallel to the floor when sitting. o DO YOUR BEST to sleep in the bed at night. DO NOT sleep in your recliner. Long hours of sitting in a recliner leads to swelling of the legs and/or potential wounds on your backside. Additional Orders / Instructions o Follow Nutritious Diet and Increase Protein Intake Medications-Please add to medication list. o Take one 500mg  Tylenol (Acetaminophen) and one 200mg  Motrin (Ibuprofen) every 6 hours for pain. Do not take ibuprofen if you are on blood thinners or have stomach ulcers. - as needed for pain if not contraindicated by other physician Wound Treatment Wound #1 - Lower Leg Wound Laterality: Left, Anterior, Distal Cleanser: Soap and Water 1 x Per Week/30 Days Discharge Instructions: Gently cleanse wound with antibacterial soap, rinse and pat dry prior to dressing wounds Secondary Dressing: ABD Pad 5x9 (in/in) 1 x Per Week/30 Days Discharge Instructions: Cover with ABD  pad Compression Wrap: Profore Lite LF 3 Multilayer Compression Bandaging System 1 x Per Week/30 Days Discharge Instructions: Apply 3 multi-layer wrap as prescribed. Electronic Signature(s) Signed: 12/23/2020 9:00:41 AM By: Berneta Sages, Alexandre M. ([id number]) Signed: 12/23/2020 6:06:35 PM By: Worthy Keeler PA-C Entered By: Donnamarie Poag on 12/23/2020 08:41:50 Janet [name], [name]  ([id number]) -------------------------------------------------------------------------------- Problem List Details Patient Name: Janet [name]. Date of Service: 12/23/2020 8:30 AM Medical Record Number: [id number] Patient Account Number: [id number] Date of Birth/Sex: [birth year] (70 y.o. F) Treating RN: Donnamarie Poag Primary Care Provider: Delight Stare Other Clinician: Referring Provider: Delight Stare Treating Provider/Extender: Skipper Cliche in Treatment: 16 Active Problems ICD-10 Encounter Code Description Active Date MDM Diagnosis I87.2 Venous insufficiency (chronic) (peripheral) 08/29/2020 No Yes I89.0 Lymphedema, not elsewhere classified 08/29/2020 No Yes 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 of other part of left lower leg with fat layer 08/29/2020 No Yes exposed Inactive Problems ICD-10 Code Description Active Date Inactive Date L03.115 Cellulitis of right lower limb 09/05/2020 09/05/2020 R22.41 Localized swelling, mass and lump, right lower limb 09/05/2020 09/05/2020 Resolved Problems Electronic Signature(s) Signed: 12/23/2020 8:19:02 AM By: Worthy Keeler PA-C Entered By: Worthy Keeler on 12/23/2020 08:19:01 Janet [name], [name] ([id number]) -------------------------------------------------------------------------------- Progress Note Details Patient Name: Janet [name]. Date of Service: 12/23/2020 8:30 AM Medical Record Number: [id number] Patient Account Number: [id number] Date of Birth/Sex: December 23, 1950 (70 y.o. F) Treating RN: Donnamarie Poag Primary Care Provider: Delight Stare Other Clinician: Referring Provider: Delight Stare Treating Provider/Extender: Skipper Cliche in Treatment: 16 Subjective Chief Complaint Information obtained from Patient Left LE Ulcers History of Present Illness (HPI) 08/29/2020 upon evaluation today patient appears to be doing somewhat poorly in regard to what appeared to be venous leg ulcers on the left leg that she has been dealing with since around the  beginning of May. Fortunately she does not have any signs of obvious infection unfortunately she does have quite a bit of necrotic tissue that does not really need to be debrided. She has been seeing vascular and they did wrap her for a time with what was likely an Haematologist. With that being said the [facility] boot has given her some trouble as far as feeling claustrophobic was concerned she is not sure if she is good to be able to tolerate a compression wrap currently although we will get a try she is in agreement with Aleve given this shot. Fortunately I do not see any signs of active infection systemically which is great news and even locally she seems to be doing quite well today. Patient's Right ABI is 1.08 with a TBI of 0.79 and the left ABI is 1.15 with a TBI of 0.84 on 08/03/20. She also has a history of chronic venous insufficiency and lymphedema but otherwise no major medical problems. 09/05/2020 upon evaluation today patient unfortunately appears to be having some issues today with her right leg this is completely different from what we have been taking care of on the left. She actually has some swelling and an area of erythema and pain which is in the medial calf region. There is no identifiable abscess definitely this could just be cellulitis but with her history of DVT I think that we need to have this checked out. 09/12/2020 upon evaluation today patient appears to be doing better in regard to her wound. She has been tolerating the dressing changes without complication. Fortunately there does not appear to be any signs of active infection which is great news and overall I  think that the patient is doing well in this regard. Her wounds are measuring smaller at both locations and the surface of the wound is looking better. With that being said she does have a wedding that she is supposed to be going to this weekend and states that she really needs to not have the wrap on for that. Nonetheless  regular and discuss options to try to help maintain while she has this time with the wedding. 09/20/2020 upon evaluation today patient unfortunately is doing worse in regard to the pain associated with her wound at this time. Fortunately there does not appear to be any evidence of active infection systemically which is great news but in general I feel like locally she may definitely be infected. Subsequently I do believe that we need to address this as soon as possible. 09/26/2020 upon evaluation today patient appears to be doing well with regard to her wound. Fortunately there does not appear to be any signs of worsening in general. I think that overall the patient is making wonderful progress in my opinion. The right leg where she had an area of erythema medially also is better today which is great news. I think Levaquin has done a great job in that regard. We did see evidence still some erythema on the right leg but again this is significantly improved compared to last week. Overall the Levaquin has done a great job in this regard. 10/03/2020 upon evaluation today patient appears to be doing well all things considered with regard to her wounds. There does not appear to be any signs of infection obvious at this point which is great news. Fortunately I think that she is definitely making progress with regard to the appearance of the wound bed but nonetheless is still having quite a bit of an issue here with the pain aspect. Fortunately I think that we have some other options to consider him and use triamcinolone underneath the [facility] today. Also think looking into PuraPly/Apligraf as a treatment could be appropriate for this patient and my hope is that we will be able to get the wounds growing in a much faster pace as far as new tissue is concerned to get this closed sooner rather than later as that is the only way that we will get a get the pain completely resolved for her which is ultimately the  goal we have as well obviously. 9/26; 2 wounds on the left anterior lower leg in the setting of what appears to be severe chronic venous insufficiency. We have been using Hydrofera Blue and 3 layer compression. 10/17/2020 upon evaluation today patient appears to be doing well with regard to her leg ulcer. She has been tolerating the dressing changes without complication. Fortunately there does not appear to be any signs of active infection at this time. No fevers, chills, nausea, vomiting, or diarrhea. 10/10; 2 wounds on the left lateral calf. She has been using Hydrofera Blue. Still complains of pain. She is on her second week of that was started last week. 10/31/2020 upon evaluation today patient's wound bed actually showed signs of good granulation and epithelization at this point. Fortunately there does not appear to be any signs of active infection which is great news and overall very pleased with where we stand. I do think that the patient is making excellent progress based on what we are seeing I believe the triamcinolone along with a Hydrofera Blue is doing a great job. 11/07/2020 upon evaluation today patient appears to  be doing well with regard to her wounds. This is going slowly but nonetheless I believe that the fact that she is responding to the triamcinolone and is doing better is a good sign I also believe this is a sign that were definitely dealing with more of an inflammatory process. Nonetheless I am extremely happy with where things stand today. 11/14/2020 upon evaluation today patient appears to be doing well with regard to her wounds. The more superior wound actually appears to be almost completely closed. The inferior area is measuring smaller but still open she still has a fairly significant amount of pain. 11/21/2020 upon inspection patient's wound bed actually showed signs of good granulation epithelization at this point. Fortunately there does not appear to be any evidence of  active infection systemically which is great news and overall I am actually extremely pleased with where we stand. I do believe that the patient is making good progress in fact the wound seem to be showing signs of shrinking week by week and the progress is Janet [name], [name] ([id number]) fairly dramatic. The proximal wound does appear to likely be healed today. 11/28/2020 upon inspection patient's wound bed actually showed signs of good granulation epithelization at this point. Fortunately I do not see any evidence of infection currently which is great news and overall I think that she is making excellent progress. 12/05/2020 upon evaluation today patient appears to be doing well with regard to her leg ulcer. I am definitely showing signs of improvement here which is great news. I do not see any evidence of infection and I think we are headed in the right direction. With that being said I think the patient could be approved and evaluated for Apligraf if approved and we could see about what we could do in regard to getting this applied even for next week. Again some of this will depend on timing being that it is a holiday may have to be the following week. Either way I do think this could be beneficial in trying to help get this healed much more effectively and quickly. The patient is in agreement with giving this a trial. 12/15/2020 upon evaluation today patient appears to be doing decently well in regard to her wound. I am seeing some signs of improvement. I am hopeful that the Apligraf will help this distillate much more effectively and quickly. She is making progress but is been a very slow progress up to this point. 12/23/2020 upon evaluation today patient actually appears to be doing excellent in regard to her wound on the leg this is very close to complete resolution I think the Apligraf is doing an awesome job for her to be honest. This was her first application today is the  second. Objective Constitutional Well-nourished and well-hydrated in no acute distress. Vitals Time Taken: 8:20 AM, Height: 66 in, Weight: 192 lbs, BMI: 31, Temperature: 98.6 F, Pulse: 81 bpm, Respiratory Rate: 16 breaths/min, Blood Pressure: 157/70 mmHg. Respiratory normal breathing without difficulty. Psychiatric this patient is able to make decisions and demonstrates good insight into disease process. Alert and Oriented x 3. pleasant and cooperative. General Notes: Upon inspection patient's wound bed did not require any sharp debridement which is great news and overall I think that she is really doing quite well based on what I see currently. No fevers, chills, nausea, vomiting, or diarrhea. I therefore did actually go ahead and reapply the second application of Apligraf today and she was secured with Steri-Strips and Mepitel. Integumentary (Hair,  Skin) Wound #1 status is Open. Original cause of wound was Blister. The date acquired was: 05/15/2020. The wound has been in treatment 16 weeks. The wound is located on the [facility] Lower Leg. The wound measures 1.7cm length x 0.7cm width x 0.1cm depth; 0.935cm^2 area and 0.093cm^3 volume. There is Fat Layer (Subcutaneous Tissue) exposed. There is no tunneling or undermining noted. There is a medium amount of serosanguineous drainage noted. There is large (67-100%) red, hyper - granulation within the wound bed. There is a small (1-33%) amount of necrotic tissue within the wound bed including Adherent Slough. Assessment Active Problems ICD-10 Venous insufficiency (chronic) (peripheral) Lymphedema, not elsewhere classified Non-pressure chronic ulcer of other part of left lower leg with fat layer exposed Procedures Wound #1 Pre-procedure diagnosis of Wound #1 is a Venous Leg Ulcer located on the Left,Distal,Anterior Lower Leg. A skin graft procedure using a bioengineered skin substitute/cellular or tissue based product was performed  by Tommie Sams., PA-C with the following instrument(s): Forceps Janet [name], [name] ([id number]) and Scissors. Apligraf was applied and secured with Steri-Strips. 11 sq cm of product was utilized and 33 sq cm was wasted due to wound size. Post Application, mepitel was applied. A Time Out was conducted at 08:36, prior to the start of the procedure. The procedure was tolerated well. Post procedure Diagnosis Wound #1: Same as Pre-Procedure . Pre-procedure diagnosis of Wound #1 is a Venous Leg Ulcer located on the Left,Distal,Anterior Lower Leg . There was a Three Layer Compression Therapy Procedure by Donnamarie Poag, RN. Post procedure Diagnosis Wound #1: Same as Pre-Procedure Plan Follow-up Appointments: Return Appointment in 1 week. Nurse Visit as needed Bathing/ Shower/ Hygiene: May shower with wound dressing protected with water repellent cover or cast protector. No tub bath. Anesthetic (Use 'Patient Medications' Section for Anesthetic Order Entry): Lidocaine applied to wound bed Cellular or Tissue Based Products: Cellular or Tissue Based Product applied to wound bed; including contact layer, fixation with steri-strips, dry gauze and cover dressing. (DO NOT REMOVE). - Apigraf #2 Edema Control - Lymphedema / Segmental Compressive Device / Other: Optional: One layer of unna paste to top of compression wrap (to act as an anchor). - left leg Patient to wear own compression stockings. Remove compression stockings every night before going to bed and put on every morning when getting up. - right leg-see Elastic Therapy contact and measurements Use on RIGHT leg Patient to wear own Velcro compression garment. Remove compression stockings every night before going to bed and put on every morning when getting up. - "JUXTELITE"-will let you know when to bring to appt Elevate legs to the level of the heart and pump ankles as often as possible Elevate leg(s) parallel to the floor when sitting. DO YOUR BEST  to sleep in the bed at night. DO NOT sleep in your recliner. Long hours of sitting in a recliner leads to swelling of the legs and/or potential wounds on your backside. Additional Orders / Instructions: Follow Nutritious Diet and Increase Protein Intake Medications-Please add to medication list.: Take one 500mg  Tylenol (Acetaminophen) and one 200mg  Motrin (Ibuprofen) every 6 hours for pain. Do not take ibuprofen if you are on blood thinners or have stomach ulcers. - as needed for pain if not contraindicated by other physician WOUND #1: - Lower Leg Wound Laterality: Left, Anterior, Distal Cleanser: Soap and Water 1 x Per Week/30 Days Discharge Instructions: Gently cleanse wound with antibacterial soap, rinse and pat dry prior to dressing wounds Secondary Dressing: ABD Pad  5x9 (in/in) 1 x Per Week/30 Days Discharge Instructions: Cover with ABD pad Compression Wrap: Profore Lite LF 3 Multilayer Compression Bandaging System 1 x Per Week/30 Days Discharge Instructions: Apply 3 multi-layer wrap as prescribed. 1. I am good recommend currently we continue with the Apligraf as that is doing a great job that was replaced today. 2. We will also continue with the 3 layer compression wrap which is also doing a great job. We will see patient back for reevaluation in 1 week here in the clinic. If anything worsens or changes patient will contact our office for additional recommendations. Electronic Signature(s) Signed: 12/23/2020 5:58:46 PM By: Worthy Keeler PA-C Entered By: Worthy Keeler on 12/23/2020 17:58:46 Janet [name], [name] ([id number]) -------------------------------------------------------------------------------- SuperBill Details Patient Name: Janet [name]. Date of Service: 12/23/2020 Medical Record Number: [id number] Patient Account Number: [id number] Date of Birth/Sex: 1950-02-20 (70 y.o. F) Treating RN: Donnamarie Poag Primary Care Provider: Delight Stare Other Clinician: Referring Provider:  Delight Stare Treating Provider/Extender: Skipper Cliche in Treatment: 16 Diagnosis Coding ICD-10 Codes Code Description I87.2 Venous insufficiency (chronic) (peripheral) 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 of other part of left lower leg with fat layer exposed I89.0 Lymphedema, not elsewhere classified Facility Procedures CPT4 Code: 79390300 Description: [phone] (Facility Use Only) Apligraf 44 SQ CM Modifier: Quantity: [place] Code: [id number] Description: 35456 - SKIN SUB GRAFT TRNK/ARM/LEG Modifier: Quantity: 1 CPT4 Code: Description: ICD-10 Diagnosis Description 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 of other part of left lower leg with fat layer Modifier: exposed Quantity: Physician Procedures CPT4 Code: [id number] Description: 73428 - WC PHYS SKIN SUB GRAFT TRNK/ARM/LEG Modifier: Quantity: 1 CPT4 Code: Description: ICD-10 Diagnosis Description 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 of other part of left lower leg with fat layer e Modifier: xposed Quantity: Electronic Signature(s) Signed: 12/23/2020 5:59:16 PM By: Worthy Keeler PA-C Previous Signature: 12/23/2020 9:00:41 AM Version By: Donnamarie Poag Entered By: Worthy Keeler on 12/23/2020 17:59:16

## 2020-12-23 NOTE — Progress Notes (Addendum)
JAZZELLE, [name] ([id number]) Visit Report for 12/23/2020 Arrival Information Details Patient Name: Janet [name], Janet [name]. Date of Service: 12/23/2020 8:30 AM Medical Record Number: [id number] Patient Account Number: [id number] Date of Birth/Sex: [birth year] (70 y.o. F) Treating RN: Donnamarie Poag Primary Care Jakyla Reza: Delight Stare Other Clinician: Referring [name]: Delight Stare Treating Kendall Justo/Extender: Skipper Cliche in Treatment: 16 Visit Information History Since Last Visit Added or deleted any medications: No Patient Arrived: Ambulatory Had a fall or experienced change in No Arrival Time: 08:16 activities of daily living that may affect Accompanied By: self risk of falls: Transfer Assistance: None Hospitalized since last visit: No Patient Identification Verified: Yes Has Dressing in Place as Prescribed: Yes Secondary Verification Process Completed: Yes Has Compression in Place as Prescribed: Yes Patient Requires Transmission-Based No Pain Present Now: Yes Precautions: Patient Has Alerts: Yes Patient Alerts: Patient on Blood Thinner Aspirin 66m AVVS ABI/TBI 7/22 ABI L- 1.15/ R- 1.08 Electronic Signature(s) Signed: 12/23/2020 9:00:41 AM By: BDonnamarie PoagEntered By: BDonnamarie Poagon 12/23/2020 08:21:14 [place] ([id number] -------------------------------------------------------------------------------- Complex / Palliative Patient Assessment Details Patient Name: Janet Pier Genesi M. Date of Service: 12/23/2020 8:30 AM Medical Record Number: [id number] Account Number: [id number] of Birth/Sex: 2December 25, 1952(70y.o. F) Treating RN: BDonnamarie PoagPrimary Care Jermaine Neuharth: MDelight StareOther Clinician: Referring [name]: MDelight StareTreating Rocsi Hazelbaker/Extender: SSkipper Clichein Treatment: 16 Palliative Management Criteria Complex Wound Management Criteria Patient has remarkable or complex co-morbidities requiring medications or treatments that extend wound healing times.  Examples: o Diabetes mellitus with chronic renal failure or end stage renal disease requiring dialysis o Advanced or poorly controlled rheumatoid arthritis o Diabetes mellitus and end stage chronic obstructive pulmonary disease o Active cancer with current chemo- or radiation therapy refused some debriding in order to promote wound healing during treatment process Care Approach Wound Care Plan: Complex Wound Management Electronic Signature(s) Signed: 12/23/2020 9:06:58 AM By: BDonnamarie PoagSigned: 12/23/2020 6:06:35 PM By: SWorthy KeelerPA-C Entered By: BDonnamarie Poagon 12/23/2020 09:06:58 Sasso, [name] ([id number] -------------------------------------------------------------------------------- Compression Therapy Details Patient Name: Janet [name] Date of Service: 12/23/2020 8:30 AM Medical Record Number: [id number] Account Number: [id number] of Birth/Sex: [birth year](70y.o. F) Treating RN: BDonnamarie PoagPrimary Care Doreena Maulden: MDelight StareOther Clinician: Referring [name]: MDelight StareTreating [name]/Extender: SSkipper Clichein Treatment: 16 Compression Therapy Performed for Wound Assessment: Wound #1 Left,Distal,Anterior Lower Leg Performed By: CJunius Argyle RN Compression Type: Three Layer Post Procedure Diagnosis Same as Pre-procedure Electronic Signature(s) Signed: 12/23/2020 9:00:41 AM By: BDonnamarie PoagEntered By: BDonnamarie Poagon 12/23/2020 08:33:32 BGeneva [name] ([id number] -------------------------------------------------------------------------------- Encounter Discharge Information Details Patient Name: Janet [name] Date of Service: 12/23/2020 8:30 AM Medical Record Number: [id number] Account Number: [id number] of Birth/Sex: [birth year](70y.o. F) Treating RN: BDonnamarie PoagPrimary Care Edmund Rick: MDelight StareOther Clinician: Referring [name]: MDelight StareTreating Debara Kamphuis/Extender: SSkipper Clichein Treatment:  16 Encounter Discharge Information Items Post Procedure Vitals Discharge Condition: Stable Temperature (F): 98.6 Ambulatory Status: Ambulatory Pulse (bpm): 81 Discharge Destination: Home Respiratory Rate (breaths/min): 16 Transportation: Private Auto Blood Pressure (mmHg): 157/81 Accompanied By: self Schedule Follow-up Appointment: Yes Clinical Summary of Care: Electronic Signature(s) Signed: 12/23/2020 9:00:41 AM By: BDonnamarie PoagEntered By: BDonnamarie Poagon 12/23/2020 08:43:16 [name], [name] ([id number] -------------------------------------------------------------------------------- Lower Extremity Assessment Details Patient Name: Janet [name] Date of Service: 12/23/2020 8:30 AM Medical Record Number: [id number] Account Number: [id number] of Birth/Sex: 2May 16, 1952(70y.o. F) Treating RN:  Donnamarie Poag Primary Care Robet Crutchfield: Delight Stare Other Clinician: Referring Tannon Peerson: Delight Stare Treating Robben Jagiello/Extender: Skipper Cliche in Treatment: 16 Edema Assessment Assessed: [Left: No] [Right: No] [Left: Edema] [Right: :] Calf Left: Right: Point of Measurement: 29 cm From Medial Instep 38 cm Ankle Left: Right: Point of Measurement: 10 cm From Medial Instep 23 cm Knee To Floor Left: Right: From Medial Instep 43 cm Vascular Assessment Pulses: Dorsalis Pedis Palpable: [Left:Yes] Electronic Signature(s) Signed: 12/23/2020 9:00:41 AM By: Donnamarie Poag Entered By: Donnamarie Poag on 12/23/2020 08:29:13 [name], [name] ([id number]) -------------------------------------------------------------------------------- Multi Wound Chart Details Patient Name: Janet [name]. Date of Service: 12/23/2020 8:30 AM Medical Record Number: [id number] Patient Account Number: [id number] Date of Birth/Sex: November 15, 1950 (70 y.o. F) Treating RN: Donnamarie Poag Primary Care Toluwani Ruder: Delight Stare Other Clinician: Referring Aneka Fagerstrom: Delight Stare Treating Lizvet Chunn/Extender: Skipper Cliche in  Treatment: 16 Vital Signs Height(in): 21 Pulse(bpm): 15 Weight(lbs): 192 Blood Pressure(mmHg): 157/70 Body Mass Index(BMI): 31 Temperature(F): 98.6 Respiratory Rate(breaths/min): 16 Photos: [N/A:N/A] Wound Location: Left, Distal, Anterior Lower Leg N/A N/A Wounding Event: Blister N/A N/A Primary Etiology: Venous Leg Ulcer N/A N/A Secondary Etiology: Lymphedema N/A N/A Comorbid History: Asthma, Hypertension N/A N/A Date Acquired: 05/15/2020 N/A N/A Weeks of Treatment: 16 N/A N/A Wound Status: Open N/A N/A Measurements L x W x D (cm) 1.7x0.7x0.1 N/A N/A Area (cm) : 0.935 N/A N/A Volume (cm) : 0.093 N/A N/A % Reduction in Area: 84.30% N/A N/A % Reduction in Volume: 84.40% N/A N/A Classification: Full Thickness Without Exposed N/A N/A Support Structures Exudate Amount: Medium N/A N/A Exudate Type: Serosanguineous N/A N/A Exudate Color: red, brown N/A N/A Granulation Amount: Large (67-100%) N/A N/A Granulation Quality: Red, Hyper-granulation N/A N/A Necrotic Amount: Small (1-33%) N/A N/A Exposed Structures: Fat Layer (Subcutaneous Tissue): N/A N/A Yes Fascia: No Tendon: No Muscle: No Joint: No Bone: No Epithelialization: None N/A N/A Treatment Notes Electronic Signature(s) Signed: 12/23/2020 9:00:41 AM By: Donnamarie Poag Entered By: Donnamarie Poag on 12/23/2020 08:33:17 Sidman, [name] ([id number]) -------------------------------------------------------------------------------- Multi-Disciplinary Care Plan Details Patient Name: Janet [name]. Date of Service: 12/23/2020 8:30 AM Medical Record Number: [id number] Patient Account Number: [id number] Date of Birth/Sex: [birth year] (70 y.o. F) Treating RN: Donnamarie Poag Primary Care Wendie Diskin: Delight Stare Other Clinician: Referring Jhonatan Lomeli: Delight Stare Treating Delmont Prosch/Extender: Skipper Cliche in Treatment: 16 Active Inactive Wound/Skin Impairment Nursing Diagnoses: Impaired tissue integrity Knowledge deficit related to  smoking impact on wound healing Knowledge deficit related to ulceration/compromised skin integrity Goals: Patient/caregiver will verbalize understanding of skin care regimen Date Initiated: 08/29/2020 Date Inactivated: 09/20/2020 Target Resolution Date: 09/14/2020 Goal Status: Met Ulcer/skin breakdown will have a volume reduction of 30% by week 4 Date Initiated: 08/29/2020 Date Inactivated: 10/17/2020 Target Resolution Date: 09/29/2020 Goal Status: Met Ulcer/skin breakdown will have a volume reduction of 50% by week 8 Date Initiated: 08/29/2020 Date Inactivated: 11/14/2020 Target Resolution Date: 10/29/2020 Goal Status: Met Ulcer/skin breakdown will have a volume reduction of 80% by week 12 Date Initiated: 08/29/2020 Target Resolution Date: 11/29/2020 Goal Status: Active Ulcer/skin breakdown will heal within 14 weeks Date Initiated: 08/29/2020 Target Resolution Date: 12/14/2020 Goal Status: Active Interventions: Assess patient/caregiver ability to obtain necessary supplies Assess patient/caregiver ability to perform ulcer/skin care regimen upon admission and as needed Assess ulceration(s) every visit Provide education on ulcer and skin care Notes: Electronic Signature(s) Signed: 12/23/2020 9:00:41 AM By: Donnamarie Poag Entered By: Donnamarie Poag on 12/23/2020 08:29:30 Ducat, [name] ([id number]) -------------------------------------------------------------------------------- Pain Assessment Details Patient Name: Janet [name]. Date of Service:  12/23/2020 8:30 AM Medical Record Number: [id number] Patient Account Number: [id number] Date of Birth/Sex: 01/03/1951 (70 y.o. F) Treating RN: Donnamarie Poag Primary Care Shaianne Nucci: Delight Stare Other Clinician: Referring Earlyne Feeser: Delight Stare Treating Milynn Quirion/Extender: Skipper Cliche in Treatment: 16 Active Problems Location of Pain Severity and Description of Pain Patient Has Paino Yes Site Locations Pain Location: Generalized Pain, Pain in  Ulcers Rate the pain. Current Pain Level: 5 Pain Management and Medication Current Pain Management: Electronic Signature(s) Signed: 12/23/2020 9:00:41 AM By: Donnamarie Poag Entered By: Donnamarie Poag on 12/23/2020 08:21:51 Janet [name], [name] ([id number]) -------------------------------------------------------------------------------- Patient/Caregiver Education Details Patient Name: Janet [name]. Date of Service: 12/23/2020 8:30 AM Medical Record Number: [id number] Patient Account Number: [id number] Date of Birth/Gender: [birth year] (70 y.o. F) Treating RN: Donnamarie Poag Primary Care Physician: Delight Stare Other Clinician: Referring Physician: Delight Stare Treating Physician/Extender: Skipper Cliche in Treatment: 16 Education Assessment Education Provided To: Patient Education Topics Provided Wound/Skin Impairment: Electronic Signature(s) Signed: 12/23/2020 9:00:41 AM By: Donnamarie Poag Entered By: Donnamarie Poag on 12/23/2020 08:42:18 Janet [name], [name] ([id number]) -------------------------------------------------------------------------------- Wound Assessment Details Patient Name: Janet [name]. Date of Service: 12/23/2020 8:30 AM Medical Record Number: [id number] Patient Account Number: [id number] Date of Birth/Sex: [birth year] (70 y.o. F) Treating RN: Donnamarie Poag Primary Care Rylie Knierim: Delight Stare Other Clinician: Referring Margaretta Chittum: Delight Stare Treating Stevon Gough/Extender: Skipper Cliche in Treatment: 16 Wound Status Wound Number: 1 Primary Etiology: Venous Leg Ulcer Wound Location: Left, Distal, Anterior Lower Leg Secondary Etiology: Lymphedema Wounding Event: Blister Wound Status: Open Date Acquired: 05/15/2020 Comorbid History: Asthma, Hypertension Weeks Of Treatment: 16 Clustered Wound: No Photos Wound Measurements Length: (cm) 1.7 Width: (cm) 0.7 Depth: (cm) 0.1 Area: (cm) 0.935 Volume: (cm) 0.093 % Reduction in Area: 84.3% % Reduction in Volume:  84.4% Epithelialization: None Tunneling: No Undermining: No Wound Description Classification: Full Thickness Without Exposed Support Structures Exudate Amount: Medium Exudate Type: Serosanguineous Exudate Color: red, brown Foul Odor After Cleansing: No Slough/Fibrino Yes Wound Bed Granulation Amount: Large (67-100%) Exposed Structure Granulation Quality: Red, Hyper-granulation Fascia Exposed: No Necrotic Amount: Small (1-33%) Fat Layer (Subcutaneous Tissue) Exposed: Yes Necrotic Quality: Adherent Slough Tendon Exposed: No Muscle Exposed: No Joint Exposed: No Bone Exposed: No Treatment Notes Wound #1 (Lower Leg) Wound Laterality: Left, Anterior, Distal Cleanser Soap and Water Discharge Instruction: Gently cleanse wound with antibacterial soap, rinse and pat dry prior to dressing wounds Peri-Wound Care Fugate, [name] ([id number]) Topical Primary Dressing Secondary Dressing ABD Pad 5x9 (in/in) Discharge Instruction: Cover with ABD pad Secured With Compression Wrap Profore Lite LF 3 Multilayer Compression Bandaging System Discharge Instruction: Apply 3 multi-layer wrap as prescribed. Compression Stockings Add-Ons Electronic Signature(s) Signed: 12/23/2020 9:00:41 AM By: Donnamarie Poag Entered By: Donnamarie Poag on 12/23/2020 08:28:11 Hanzlik, [name] ([id number]) -------------------------------------------------------------------------------- Vitals Details Patient Name: Janet [name]. Date of Service: 12/23/2020 8:30 AM Medical Record Number: [id number] Patient Account Number: [id number] Date of Birth/Sex: September 10, 1950 (70 y.o. F) Treating RN: Donnamarie Poag Primary Care Mylynn Dinh: Delight Stare Other Clinician: Referring Shealyn Sean: Delight Stare Treating Anastasya Jewell/Extender: Skipper Cliche in Treatment: 16 Vital Signs Time Taken: 08:20 Temperature (F): 98.6 Height (in): 66 Pulse (bpm): 81 Weight (lbs): 192 Respiratory Rate (breaths/min): 16 Body Mass Index (BMI): 31 Blood  Pressure (mmHg): 157/70 Reference Range: 80 - 120 mg / dl Electronic Signature(s) Signed: 12/23/2020 9:00:41 AM By: Donnamarie Poag Entered ByDonnamarie Poag on 12/23/2020 40:76:80

## 2020-12-30 ENCOUNTER — Encounter: Payer: Medicare Other | Admitting: Physician Assistant

## 2020-12-30 ENCOUNTER — Other Ambulatory Visit: Payer: Self-pay

## 2020-12-30 DIAGNOSIS — E11622 Type 2 diabetes mellitus with other skin ulcer: Secondary | ICD-10-CM | POA: Diagnosis not present

## 2020-12-30 NOTE — Progress Notes (Addendum)
EBONY, YORIO ([id number]) Visit Report for 12/30/2020 Chief Complaint Document Details Patient Name: Janet [name], Janet [name]. Date of Service: 12/30/2020 8:15 AM Medical Record Number: [id number] Patient Account Number: [id number] Date of Birth/Sex: 06/21/1950 (70 y.o. F) Treating RN: Levora Dredge Primary Care Provider: Delight Stare Other Clinician: Referring Provider: Delight Stare Treating Provider/Extender: Skipper Cliche in Treatment: 17 Information Obtained from: Patient Chief Complaint Left LE Ulcers Electronic Signature(s) Signed: 12/30/2020 8:30:28 AM By: Worthy Keeler PA-C Entered By: Worthy Keeler on 12/30/2020 08:30:28 Beg, Judith M. ([id number]) -------------------------------------------------------------------------------- HPI Details Patient Name: Janet [name]. Date of Service: 12/30/2020 8:15 AM Medical Record Number: [id number] Patient Account Number: [id number] Date of Birth/Sex: 1950-10-14 (70 y.o. F) Treating RN: Levora Dredge Primary Care Provider: Delight Stare Other Clinician: Referring Provider: Delight Stare Treating Provider/Extender: Skipper Cliche in Treatment: 17 History of Present Illness HPI Description: 08/29/2020 upon evaluation today patient appears to be doing somewhat poorly in regard to what appeared to be venous leg ulcers on the left leg that she has been dealing with since around the beginning of May. Fortunately she does not have any signs of obvious infection unfortunately she does have quite a bit of necrotic tissue that does not really need to be debrided. She has been seeing vascular and they did wrap her for a time with what was likely an Haematologist. With that being said the [facility] boot has given her some trouble as far as feeling claustrophobic was concerned she is not sure if she is good to be able to tolerate a compression wrap currently although we will get a try she is in agreement with Aleve given this shot. Fortunately I do not see  any signs of active infection systemically which is great news and even locally she seems to be doing quite well today. Patient's Right ABI is 1.08 with a TBI of 0.79 and the left ABI is 1.15 with a TBI of 0.84 on 08/03/20. She also has a history of chronic venous insufficiency and lymphedema but otherwise no major medical problems. 09/05/2020 upon evaluation today patient unfortunately appears to be having some issues today with her right leg this is completely different from what we have been taking care of on the left. She actually has some swelling and an area of erythema and pain which is in the medial calf region. There is no identifiable abscess definitely this could just be cellulitis but with her history of DVT I think that we need to have this checked out. 09/12/2020 upon evaluation today patient appears to be doing better in regard to her wound. She has been tolerating the dressing changes without complication. Fortunately there does not appear to be any signs of active infection which is great news and overall I think that the patient is doing well in this regard. Her wounds are measuring smaller at both locations and the surface of the wound is looking better. With that being said she does have a wedding that she is supposed to be going to this weekend and states that she really needs to not have the wrap on for that. Nonetheless regular and discuss options to try to help maintain while she has this time with the wedding. 09/20/2020 upon evaluation today patient unfortunately is doing worse in regard to the pain associated with her wound at this time. Fortunately there does not appear to be any evidence of active infection systemically which is great news but in general I feel like locally she may  definitely be infected. Subsequently I do believe that we need to address this as soon as possible. 09/26/2020 upon evaluation today patient appears to be doing well with regard to her wound.  Fortunately there does not appear to be any signs of worsening in general. I think that overall the patient is making wonderful progress in my opinion. The right leg where she had an area of erythema medially also is better today which is great news. I think Levaquin has done a great job in that regard. We did see evidence still some erythema on the right leg but again this is significantly improved compared to last week. Overall the Levaquin has done a great job in this regard. 10/03/2020 upon evaluation today patient appears to be doing well all things considered with regard to her wounds. There does not appear to be any signs of infection obvious at this point which is great news. Fortunately I think that she is definitely making progress with regard to the appearance of the wound bed but nonetheless is still having quite a bit of an issue here with the pain aspect. Fortunately I think that we have some other options to consider him and use triamcinolone underneath the [facility] today. Also think looking into PuraPly/Apligraf as a treatment could be appropriate for this patient and my hope is that we will be able to get the wounds growing in a much faster pace as far as new tissue is concerned to get this closed sooner rather than later as that is the only way that we will get a get the pain completely resolved for her which is ultimately the goal we have as well obviously. 9/26; 2 wounds on the left anterior lower leg in the setting of what appears to be severe chronic venous insufficiency. We have been using Hydrofera Blue and 3 layer compression. 10/17/2020 upon evaluation today patient appears to be doing well with regard to her leg ulcer. She has been tolerating the dressing changes without complication. Fortunately there does not appear to be any signs of active infection at this time. No fevers, chills, nausea, vomiting, or diarrhea. 10/10; 2 wounds on the left lateral calf. She has been  using Hydrofera Blue. Still complains of pain. She is on her second week of that was started last week. 10/31/2020 upon evaluation today patient's wound bed actually showed signs of good granulation and epithelization at this point. Fortunately there does not appear to be any signs of active infection which is great news and overall very pleased with where we stand. I do think that the patient is making excellent progress based on what we are seeing I believe the triamcinolone along with a Hydrofera Blue is doing a great job. 11/07/2020 upon evaluation today patient appears to be doing well with regard to her wounds. This is going slowly but nonetheless I believe that the fact that she is responding to the triamcinolone and is doing better is a good sign I also believe this is a sign that were definitely dealing with more of an inflammatory process. Nonetheless I am extremely happy with where things stand today. 11/14/2020 upon evaluation today patient appears to be doing well with regard to her wounds. The more superior wound actually appears to be almost completely closed. The inferior area is measuring smaller but still open she still has a fairly significant amount of pain. 11/21/2020 upon inspection patient's wound bed actually showed signs of good granulation epithelization at this point. Fortunately there does not  appear to be any evidence of active infection systemically which is great news and overall I am actually extremely pleased with where we stand. I do believe that the patient is making good progress in fact the wound seem to be showing signs of shrinking week by week and the progress is fairly dramatic. The proximal wound does appear to likely be healed today. 11/28/2020 upon inspection patient's wound bed actually showed signs of good granulation epithelization at this point. Fortunately I do not see any evidence of infection currently which is great news and overall I think that she  is making excellent progress. GEORGIANNE, GRITZ ([id number]) 12/05/2020 upon evaluation today patient appears to be doing well with regard to her leg ulcer. I am definitely showing signs of improvement here which is great news. I do not see any evidence of infection and I think we are headed in the right direction. With that being said I think the patient could be approved and evaluated for Apligraf if approved and we could see about what we could do in regard to getting this applied even for next week. Again some of this will depend on timing being that it is a holiday may have to be the following week. Either way I do think this could be beneficial in trying to help get this healed much more effectively and quickly. The patient is in agreement with giving this a trial. 12/15/2020 upon evaluation today patient appears to be doing decently well in regard to her wound. I am seeing some signs of improvement. I am hopeful that the Apligraf will help this distillate much more effectively and quickly. She is making progress but is been a very slow progress up to this point. 12/23/2020 upon evaluation today patient actually appears to be doing excellent in regard to her wound on the leg this is very close to complete resolution I think the Apligraf is doing an awesome job for her to be honest. This was her first application today is the second. 12/30/2020 upon evaluation today patient actually appears to be doing awesome in regard to her wound. At this point Apligraf has done an amazing job in my opinion. In fact she is pretty much completely healed there is a small area that still open currently and to be honest I think that this is a minimal location. Fortunately I do not see any signs of active infection locally nor systemically at this point. Electronic Signature(s) Signed: 12/30/2020 9:09:28 AM By: Worthy Keeler PA-C Entered By: Worthy Keeler on 12/30/2020 09:09:28 Mantell, Monna Fam  ([id number]) -------------------------------------------------------------------------------- Physical Exam Details Patient Name: Janet [name]. Date of Service: 12/30/2020 8:15 AM Medical Record Number: [id number] Patient Account Number: [id number] Date of Birth/Sex: [birth year] (70 y.o. F) Treating RN: Levora Dredge Primary Care Provider: Delight Stare Other Clinician: Referring Provider: Delight Stare Treating Provider/Extender: Skipper Cliche in Treatment: 37 Constitutional Well-nourished and well-hydrated in no acute distress. Respiratory normal breathing without difficulty. Psychiatric this patient is able to make decisions and demonstrates good insight into disease process. Alert and Oriented x 3. pleasant and cooperative. Notes Patient's wound bed actually showed signs of good granulation and epithelization at this point. Overall I am extremely happy with where we stand and I think the patient is making all some progress. There is just a very tiny spot still remaining open this is not necessary for Apligraf today. Electronic Signature(s) Signed: 12/30/2020 9:09:55 AM By: Worthy Keeler PA-C Entered By: Worthy Keeler on 12/30/2020  09:09:55 GEORGA, STYS ([id number]) -------------------------------------------------------------------------------- Physician Orders Details Patient Name: Janet [name]. Date of Service: 12/30/2020 8:15 AM Medical Record Number: [id number] Patient Account Number: [id number] Date of Birth/Sex: 1950/03/16 (70 y.o. F) Treating RN: Levora Dredge Primary Care Provider: Delight Stare Other Clinician: Referring Provider: Delight Stare Treating Provider/Extender: Skipper Cliche in Treatment: 17 Verbal / Phone Orders: No Diagnosis Coding ICD-10 Coding Code Description I87.2 Venous insufficiency (chronic) (peripheral) I89.0 Lymphedema, not elsewhere classified 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 of other part of left lower leg with fat layer  exposed Follow-up Appointments o Return Appointment in 1 week. o Nurse Visit as needed Bathing/ Shower/ Hygiene o May shower with wound dressing protected with water repellent cover or cast protector. o No tub bath. Anesthetic (Use 'Patient Medications' Section for Anesthetic Order Entry) o Lidocaine applied to wound bed Edema Control - Lymphedema / Segmental Compressive Device / Other o Optional: One layer of unna paste to top of compression wrap (to act as an anchor). - left leg o Patient to wear own compression stockings. Remove compression stockings every night before going to bed and put on every morning when getting up. - right leg-see El[employer] contact and measurements Use on RIGHT leg o Patient to wear own Velcro compression garment. Remove compression stockings every night before going to bed and put on every morning when getting up. - "JUXTELITE"-will let you know when to bring to appt o Elevate legs to the level of the heart and pump ankles as often as possible o Elevate leg(s) parallel to the floor when sitting. o DO YOUR BEST to sleep in the bed at night. DO NOT sleep in your recliner. Long hours of sitting in a recliner leads to swelling of the legs and/or potential wounds on your backside. Additional Orders / Instructions o Follow Nutritious Diet and Increase Protein Intake Medications-Please add to medication list. o Take one 500mg  Tylenol (Acetaminophen) and one 200mg  Motrin (Ibuprofen) every 6 hours for pain. Do not take ibuprofen if you are on blood thinners or have stomach ulcers. - as needed for pain if not contraindicated by other physician Wound Treatment Wound #1 - Lower Leg Wound Laterality: Left, Anterior, Distal Cleanser: Soap and Water 1 x Per Week/30 Days Discharge Instructions: Gently cleanse wound with antibacterial soap, rinse and pat dry prior to dressing wounds Primary Dressing: Curad Oil Emulsion Dressing 3x3 (in/in) 1 x  Per Week/30 Days Secondary Dressing: ABD Pad 5x9 (in/in) 1 x Per Week/30 Days Discharge Instructions: Cover with ABD pad Compression Wrap: Profore Lite LF 3 Multilayer Compression Bandaging System 1 x Per Week/30 Days Discharge Instructions: Apply 3 multi-layer wrap as prescribed. Electronic Signature(s) Signed: 12/30/2020 5:41:04 PM By: Worthy Keeler PA-C Signed: 01/04/2021 12:56:33 PM By: Birdie Riddle, Arlissa M. ([id number]) Entered By: Levora Dredge on 12/30/2020 09:18:20 Belair, Shyia M. ([id number]) -------------------------------------------------------------------------------- Problem List Details Patient Name: Janet [name]. Date of Service: 12/30/2020 8:15 AM Medical Record Number: [id number] Patient Account Number: [id number] Date of Birth/Sex: July 01, 1950 (70 y.o. F) Treating RN: Levora Dredge Primary Care Provider: Delight Stare Other Clinician: Referring Provider: Delight Stare Treating Provider/Extender: Skipper Cliche in Treatment: 17 Active Problems ICD-10 Encounter Code Description Active Date MDM Diagnosis I87.2 Venous insufficiency (chronic) (peripheral) 08/29/2020 No Yes I89.0 Lymphedema, not elsewhere classified 08/29/2020 No Yes 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 of other part of left lower leg with fat layer 08/29/2020 No Yes exposed Inactive Problems ICD-10 Code Description Active Date Inactive Date L03.115 Cellulitis of right lower limb  09/05/2020 09/05/2020 R22.41 Localized swelling, mass and lump, right lower limb 09/05/2020 09/05/2020 Resolved Problems Electronic Signature(s) Signed: 12/30/2020 8:30:25 AM By: Worthy Keeler PA-C Entered By: Worthy Keeler on 12/30/2020 08:30:25 Septer, Bayley M. ([id number]) -------------------------------------------------------------------------------- Progress Note Details Patient Name: Janet [name]. Date of Service: 12/30/2020 8:15 AM Medical Record Number: [id number] Patient Account Number: [id number] Date  of Birth/Sex: 04/11/1950 (70 y.o. F) Treating RN: Levora Dredge Primary Care Provider: Delight Stare Other Clinician: Referring Provider: Delight Stare Treating Provider/Extender: Skipper Cliche in Treatment: 17 Subjective Chief Complaint Information obtained from Patient Left LE Ulcers History of Present Illness (HPI) 08/29/2020 upon evaluation today patient appears to be doing somewhat poorly in regard to what appeared to be venous leg ulcers on the left leg that she has been dealing with since around the beginning of May. Fortunately she does not have any signs of obvious infection unfortunately she does have quite a bit of necrotic tissue that does not really need to be debrided. She has been seeing vascular and they did wrap her for a time with what was likely an Haematologist. With that being said the [facility] boot has given her some trouble as far as feeling claustrophobic was concerned she is not sure if she is good to be able to tolerate a compression wrap currently although we will get a try she is in agreement with Aleve given this shot. Fortunately I do not see any signs of active infection systemically which is great news and even locally she seems to be doing quite well today. Patient's Right ABI is 1.08 with a TBI of 0.79 and the left ABI is 1.15 with a TBI of 0.84 on 08/03/20. She also has a history of chronic venous insufficiency and lymphedema but otherwise no major medical problems. 09/05/2020 upon evaluation today patient unfortunately appears to be having some issues today with her right leg this is completely different from what we have been taking care of on the left. She actually has some swelling and an area of erythema and pain which is in the medial calf region. There is no identifiable abscess definitely this could just be cellulitis but with her history of DVT I think that we need to have this checked out. 09/12/2020 upon evaluation today patient appears to be doing better  in regard to her wound. She has been tolerating the dressing changes without complication. Fortunately there does not appear to be any signs of active infection which is great news and overall I think that the patient is doing well in this regard. Her wounds are measuring smaller at both locations and the surface of the wound is looking better. With that being said she does have a wedding that she is supposed to be going to this weekend and states that she really needs to not have the wrap on for that. Nonetheless regular and discuss options to try to help maintain while she has this time with the wedding. 09/20/2020 upon evaluation today patient unfortunately is doing worse in regard to the pain associated with her wound at this time. Fortunately there does not appear to be any evidence of active infection systemically which is great news but in general I feel like locally she may definitely be infected. Subsequently I do believe that we need to address this as soon as possible. 09/26/2020 upon evaluation today patient appears to be doing well with regard to her wound. Fortunately there does not appear to be any signs of worsening in  general. I think that overall the patient is making wonderful progress in my opinion. The right leg where she had an area of erythema medially also is better today which is great news. I think Levaquin has done a great job in that regard. We did see evidence still some erythema on the right leg but again this is significantly improved compared to last week. Overall the Levaquin has done a great job in this regard. 10/03/2020 upon evaluation today patient appears to be doing well all things considered with regard to her wounds. There does not appear to be any signs of infection obvious at this point which is great news. Fortunately I think that she is definitely making progress with regard to the appearance of the wound bed but nonetheless is still having quite a bit of an  issue here with the pain aspect. Fortunately I think that we have some other options to consider him and use triamcinolone underneath the [facility] today. Also think looking into PuraPly/Apligraf as a treatment could be appropriate for this patient and my hope is that we will be able to get the wounds growing in a much faster pace as far as new tissue is concerned to get this closed sooner rather than later as that is the only way that we will get a get the pain completely resolved for her which is ultimately the goal we have as well obviously. 9/26; 2 wounds on the left anterior lower leg in the setting of what appears to be severe chronic venous insufficiency. We have been using Hydrofera Blue and 3 layer compression. 10/17/2020 upon evaluation today patient appears to be doing well with regard to her leg ulcer. She has been tolerating the dressing changes without complication. Fortunately there does not appear to be any signs of active infection at this time. No fevers, chills, nausea, vomiting, or diarrhea. 10/10; 2 wounds on the left lateral calf. She has been using Hydrofera Blue. Still complains of pain. She is on her second week of that was started last week. 10/31/2020 upon evaluation today patient's wound bed actually showed signs of good granulation and epithelization at this point. Fortunately there does not appear to be any signs of active infection which is great news and overall very pleased with where we stand. I do think that the patient is making excellent progress based on what we are seeing I believe the triamcinolone along with a Hydrofera Blue is doing a great job. 11/07/2020 upon evaluation today patient appears to be doing well with regard to her wounds. This is going slowly but nonetheless I believe that the fact that she is responding to the triamcinolone and is doing better is a good sign I also believe this is a sign that were definitely dealing with more of an  inflammatory process. Nonetheless I am extremely happy with where things stand today. 11/14/2020 upon evaluation today patient appears to be doing well with regard to her wounds. The more superior wound actually appears to be almost completely closed. The inferior area is measuring smaller but still open she still has a fairly significant amount of pain. 11/21/2020 upon inspection patient's wound bed actually showed signs of good granulation epithelization at this point. Fortunately there does not appear to be any evidence of active infection systemically which is great news and overall I am actually extremely pleased with where we stand. I do believe that the patient is making good progress in fact the wound seem to be showing signs of  shrinking week by week and the progress is Blandon, Marlynn M. ([id number]) fairly dramatic. The proximal wound does appear to likely be healed today. 11/28/2020 upon inspection patient's wound bed actually showed signs of good granulation epithelization at this point. Fortunately I do not see any evidence of infection currently which is great news and overall I think that she is making excellent progress. 12/05/2020 upon evaluation today patient appears to be doing well with regard to her leg ulcer. I am definitely showing signs of improvement here which is great news. I do not see any evidence of infection and I think we are headed in the right direction. With that being said I think the patient could be approved and evaluated for Apligraf if approved and we could see about what we could do in regard to getting this applied even for next week. Again some of this will depend on timing being that it is a holiday may have to be the following week. Either way I do think this could be beneficial in trying to help get this healed much more effectively and quickly. The patient is in agreement with giving this a trial. 12/15/2020 upon evaluation today patient appears to be doing  decently well in regard to her wound. I am seeing some signs of improvement. I am hopeful that the Apligraf will help this distillate much more effectively and quickly. She is making progress but is been a very slow progress up to this point. 12/23/2020 upon evaluation today patient actually appears to be doing excellent in regard to her wound on the leg this is very close to complete resolution I think the Apligraf is doing an awesome job for her to be honest. This was her first application today is the second. 12/30/2020 upon evaluation today patient actually appears to be doing awesome in regard to her wound. At this point Apligraf has done an amazing job in my opinion. In fact she is pretty much completely healed there is a small area that still open currently and to be honest I think that this is a minimal location. Fortunately I do not see any signs of active infection locally nor systemically at this point. Objective Constitutional Well-nourished and well-hydrated in no acute distress. Vitals Time Taken: 8:26 AM, Height: 66 in, Weight: 192 lbs, BMI: 31, Temperature: 98.3 F, Pulse: 84 bpm, Respiratory Rate: 18 breaths/min, Blood Pressure: 149/79 mmHg. Respiratory normal breathing without difficulty. Psychiatric this patient is able to make decisions and demonstrates good insight into disease process. Alert and Oriented x 3. pleasant and cooperative. General Notes: Patient's wound bed actually showed signs of good granulation and epithelization at this point. Overall I am extremely happy with where we stand and I think the patient is making all some progress. There is just a very tiny spot still remaining open this is not necessary for Apligraf today. Integumentary (Hair, Skin) Wound #1 status is Open. Original cause of wound was Blister. The date acquired was: 05/15/2020. The wound has been in treatment 17 weeks. The wound is located on the [facility] Lower Leg. The wound  measures 0.2cm length x 10.2cm width x 0.1cm depth; 1.602cm^2 area and 0.16cm^3 volume. There is Fat Layer (Subcutaneous Tissue) exposed. There is no tunneling or undermining noted. There is a medium amount of serosanguineous drainage noted. There is large (67-100%) red, hyper - granulation within the wound bed. There is a small (1-33%) amount of necrotic tissue within the wound bed. Assessment Active Problems ICD-10 Venous insufficiency (chronic) (peripheral) Lymphedema,  not elsewhere classified Non-pressure chronic ulcer of other part of left lower leg with fat layer exposed Plan Obeso, Dodie M. ([id number]) 1. Would recommend that we use just a little bit of oil emulsion over the area in question. The patient is in agreement with that plan. 2. I am also can recommend that we continue with the compression wrap. I Minna recommend an ABD pad and then a 3 layer compression wrap. 3. I would also recommend she continue to elevate her leg. I am hopeful she will be ready for discharge next week. We will see patient back for reevaluation in 1 week here in the clinic. If anything worsens or changes patient will contact our office for additional recommendations. Electronic Signature(s) Signed: 12/30/2020 9:11:06 AM By: Worthy Keeler PA-C Entered By: Worthy Keeler on 12/30/2020 09:11:06 Rayl, Krystine M. ([id number]) -------------------------------------------------------------------------------- SuperBill Details Patient Name: Janet [name]. Date of Service: 12/30/2020 Medical Record Number: [id number] Patient Account Number: [id number] Date of Birth/Sex: 04-18-1950 (70 y.o. F) Treating RN: Levora Dredge Primary Care Provider: Delight Stare Other Clinician: Referring Provider: Delight Stare Treating Provider/Extender: Skipper Cliche in Treatment: 17 Diagnosis Coding ICD-10 Codes Code Description I87.2 Venous insufficiency (chronic) (peripheral) I89.0 Lymphedema, not elsewhere  classified 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 of other part of left lower leg with fat layer exposed Facility Procedures CPT4 Code: 55208022 Description: (Facility Use Only) [phone] - [facility] LWR LT LEG Modifier: Quantity: 1 Physician Procedures CPT4 Code: 4975300 Description: 51102 - WC PHYS LEVEL 3 - EST PT Modifier: Quantity: 1 CPT4 Code: Description: ICD-10 Diagnosis Description I87.2 Venous insufficiency (chronic) (peripheral) I89.0 Lymphedema, not elsewhere classified 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 of other part of left lower leg with fat lay Modifier: er exposed Quantity: Electronic Signature(s) Signed: 12/30/2020 5:41:04 PM By: Worthy Keeler PA-C Signed: 01/04/2021 12:56:33 PM By: Levora Dredge Previous Signature: 12/30/2020 9:11:20 AM Version By: Worthy Keeler PA-C Entered By: Levora Dredge on 12/30/2020 09:19:06

## 2021-01-04 NOTE — Progress Notes (Signed)
ZARIE, KOSIBA ([id number]) Visit Report for 12/30/2020 Arrival Information Details Patient Name: Janet [name], Janet [name]. Date of Service: 12/30/2020 8:15 AM Medical Record Number: [id number] Patient Account Number: [id number] Date of Birth/Sex: 02-15-1950 (70 y.o. F) Treating RN: Levora Dredge Primary Care Adyson Vanburen: Delight Stare Other Clinician: Referring Mallorey Odonell: Delight Stare Treating Keano Guggenheim/Extender: Skipper Cliche in Treatment: 12 Visit Information History Since Last Visit Added or deleted any medications: No Patient Arrived: Ambulatory Any new allergies or adverse reactions: No Arrival Time: 08:24 Had a fall or experienced change in No Accompanied By: self activities of daily living that may affect Transfer Assistance: None risk of falls: Patient Identification Verified: Yes Hospitalized since last visit: No Secondary Verification Process Completed: Yes Has Dressing in Place as Prescribed: Yes Patient Requires Transmission-Based No Has Compression in Place as Prescribed: Yes Precautions: Pain Present Now: Yes Patient Has Alerts: Yes Patient Alerts: Patient on Blood Thinner Aspirin 95m AVVS ABI/TBI 7/22 ABI L- 1.15/ R- 1.08 Electronic Signature(s) Signed: 01/04/2021 12:56:33 PM By: GLevora DredgeEntered By: GLevora Dredgeon 12/30/2020 08:26:38 Dibenedetto, Toia M. ([id number] -------------------------------------------------------------------------------- Clinic Level of Care Assessment Details Patient Name: Janet [name] Date of Service: 12/30/2020 8:15 AM Medical Record Number: [id number] Account Number: [id number] of Birth/Sex: 208-Nov-1952(70y.o. F) Treating RN: GLevora DredgePrimary Care Remell Giaimo: MDelight StareOther Clinician: Referring Mayda Shippee: MDelight StareTreating Kathrine Rieves/Extender: SSkipper Clichein Treatment: 17 Clinic Level of Care Assessment Items TOOL 4 Quantity Score X - Use when only an EandM is performed on FOLLOW-UP visit 1  0 ASSESSMENTS - Nursing Assessment / Reassessment '[]'  - Reassessment of Co-morbidities (includes updates in patient status) 0 '[]'  - 0 Reassessment of Adherence to Treatment Plan ASSESSMENTS - Wound and Skin Assessment / Reassessment X - Simple Wound Assessment / Reassessment - one wound 1 5 '[]'  - 0 Complex Wound Assessment / Reassessment - multiple wounds '[]'  - 0 Dermatologic / Skin Assessment (not related to wound area) ASSESSMENTS - Focused Assessment '[]'  - Circumferential Edema Measurements - multi extremities 0 '[]'  - 0 Nutritional Assessment / Counseling / Intervention '[]'  - 0 Lower Extremity Assessment (monofilament, tuning fork, pulses) '[]'  - 0 Peripheral Arterial Disease Assessment (using hand held doppler) ASSESSMENTS - Ostomy and/or Continence Assessment and Care '[]'  - Incontinence Assessment and Management 0 '[]'  - 0 Ostomy Care Assessment and Management (repouching, etc.) PROCESS - Coordination of Care X - Simple Patient / Family Education for ongoing care 1 15 '[]'  - 0 Complex (extensive) Patient / Family Education for ongoing care '[]'  - 0 Staff obtains CProgrammer, systems Records, Test Results / Process Orders '[]'  - 0 Staff telephones HHA, Nursing Homes / Clarify orders / etc '[]'  - 0 Routine Transfer to another Facility (non-emergent condition) '[]'  - 0 Routine Hospital Admission (non-emergent condition) '[]'  - 0 New Admissions / IBiomedical engineer/ Ordering NPWT, Apligraf, etc. '[]'  - 0 Emergency Hospital Admission (emergent condition) X- 1 10 Simple Discharge Coordination '[]'  - 0 Complex (extensive) Discharge Coordination PROCESS - Special Needs '[]'  - Pediatric / Minor Patient Management 0 '[]'  - 0 Isolation Patient Management '[]'  - 0 Hearing / Language / Visual special needs '[]'  - 0 Assessment of Community assistance (transportation, D/C planning, etc.) '[]'  - 0 Additional assistance / Altered mentation '[]'  - 0 Support Surface(s) Assessment (bed, cushion, seat,  etc.) INTERVENTIONS - Wound Cleansing / Measurement Bra[place]ford, Dave M. ([id number] X- 1 5 Simple Wound Cleansing - one wound '[]'  - 0 Complex Wound Cleansing - multiple wounds X- 1 5 Wound  Imaging (photographs - any number of wounds) '[]'  - 0 Wound Tracing (instead of photographs) X- 1 5 Simple Wound Measurement - one wound '[]'  - 0 Complex Wound Measurement - multiple wounds INTERVENTIONS - Wound Dressings X - Small Wound Dressing one or multiple wounds 1 10 '[]'  - 0 Medium Wound Dressing one or multiple wounds '[]'  - 0 Large Wound Dressing one or multiple wounds '[]'  - 0 Application of Medications - topical '[]'  - 0 Application of Medications - injection INTERVENTIONS - Miscellaneous '[]'  - External ear exam 0 '[]'  - 0 Specimen Collection (cultures, biopsies, blood, body fluids, etc.) '[]'  - 0 Specimen(s) / Culture(s) sent or taken to Lab for analysis '[]'  - 0 Patient Transfer (multiple staff / Civil Service fast streamer / Similar devices) '[]'  - 0 Simple Staple / Suture removal (25 or less) '[]'  - 0 Complex Staple / Suture removal (26 or more) '[]'  - 0 Hypo / Hyperglycemic Management (close monitor of Blood Glucose) '[]'  - 0 Ankle / Brachial Index (ABI) - do not check if billed separately X- 1 5 Vital Signs Has the patient been seen at the hospital within the last three years: Yes Total Score: 60 Level Of Care: New/Established - Level 2 Electronic Signature(s) Signed: 01/04/2021 12:56:33 PM By: Levora Dredge Entered By: Levora Dredge on 12/30/2020 09:18:47 Murton, Lailana M. ([id number]) -------------------------------------------------------------------------------- Compression Therapy Details Patient Name: Janet [name]. Date of Service: 12/30/2020 8:15 AM Medical Record Number: [id number] Patient Account Number: [id number] Date of Birth/Sex: May 12, 1950 (70 y.o. F) Treating RN: Levora Dredge Primary Care Drayden Lukas: Delight Stare Other Clinician: Referring Consuelo Thayne: Delight Stare Treating  Giannamarie Paulus/Extender: Skipper Cliche in Treatment: 17 Compression Therapy Performed for Wound Assessment: Wound #1 Left,Distal,Anterior Lower Leg Performed By: Clinician Levora Dredge, RN Compression Type: Three Layer Post Procedure Diagnosis Same as Pre-procedure Electronic Signature(s) Signed: 01/04/2021 12:56:33 PM By: Levora Dredge Entered By: Levora Dredge on 12/30/2020 09:13:46 Robison, Tyrianna M. ([id number]) -------------------------------------------------------------------------------- Encounter Discharge Information Details Patient Name: Janet [name]. Date of Service: 12/30/2020 8:15 AM Medical Record Number: [id number] Patient Account Number: [id number] Date of Birth/Sex: 1950-05-16 (70 y.o. F) Treating RN: Levora Dredge Primary Care Kenita Bines: Delight Stare Other Clinician: Referring Carmine Carrozza: Delight Stare Treating Alasha Mcguinness/Extender: Skipper Cliche in Treatment: 17 Encounter Discharge Information Items Discharge Condition: Stable Ambulatory Status: Ambulatory Discharge Destination: Home Transportation: Private Auto Accompanied By: Gabriel Rung Schedule Follow-up Appointment: Yes Clinical Summary of Care: Electronic Signature(s) Signed: 01/04/2021 12:56:33 PM By: Levora Dredge Entered By: Levora Dredge on 12/30/2020 09:20:16 Trost, Letina M. ([id number]) -------------------------------------------------------------------------------- Lower Extremity Assessment Details Patient Name: Janet [name]. Date of Service: 12/30/2020 8:15 AM Medical Record Number: [id number] Patient Account Number: [id number] Date of Birth/Sex: 07/15/1950 (70 y.o. F) Treating RN: Levora Dredge Primary Care Duane Trias: Delight Stare Other Clinician: Referring Rilley Stash: Delight Stare Treating Latasha Buczkowski/Extender: Skipper Cliche in Treatment: 17 Edema Assessment Assessed: [Left: No] [Right: No] Edema: [Left: N] [Right: o] Calf Left: Right: Point of Measurement: 29 cm From Medial Instep 37.5  cm Ankle Left: Right: Point of Measurement: 10 cm From Medial Instep 23 cm Vascular Assessment Pulses: Dorsalis Pedis Palpable: [Left:Yes] Electronic Signature(s) Signed: 01/04/2021 12:56:33 PM By: Levora Dredge Entered By: Levora Dredge on 12/30/2020 08:41:20 Nagengast, Desiree M. ([id number]) -------------------------------------------------------------------------------- Multi Wound Chart Details Patient Name: Janet [name]. Date of Service: 12/30/2020 8:15 AM Medical Record Number: [id number] Patient Account Number: [id number] Date of Birth/Sex: 14-Dec-1950 (70 y.o. F) Treating RN: Levora Dredge Primary Care Newel Oien: Delight Stare Other Clinician: Referring Oveta Idris: Delight Stare Treating Nayab Aten/Extender: Jeri Cos  Weeks in Treatment: 17 Vital Signs Height(in): 66 Pulse(bpm): 84 Weight(lbs): 192 Blood Pressure(mmHg): 149/79 Body Mass Index(BMI): 31 Temperature(F): 98.3 Respiratory Rate(breaths/min): 18 Photos: [N/A:N/A] Wound Location: Left, Distal, Anterior Lower Leg N/A N/A Wounding Event: Blister N/A N/A Primary Etiology: Venous Leg Ulcer N/A N/A Secondary Etiology: Lymphedema N/A N/A Comorbid History: Asthma, Hypertension N/A N/A Date Acquired: 05/15/2020 N/A N/A Weeks of Treatment: 17 N/A N/A Wound Status: Open N/A N/A Measurements L x W x D (cm) 0.2x10.2x0.1 N/A N/A Area (cm) : 1.602 N/A N/A Volume (cm) : 0.16 N/A N/A % Reduction in Area: 73.20% N/A N/A % Reduction in Volume: 73.20% N/A N/A Classification: Full Thickness Without Exposed N/A N/A Support Structures Exudate Amount: Medium N/A N/A Exudate Type: Serosanguineous N/A N/A Exudate Color: red, brown N/A N/A Granulation Amount: Large (67-100%) N/A N/A Granulation Quality: Red, Hyper-granulation N/A N/A Necrotic Amount: Small (1-33%) N/A N/A Exposed Structures: Fat Layer (Subcutaneous Tissue): N/A N/A Yes Fascia: No Tendon: No Muscle: No Joint: No Bone: No Epithelialization: Medium (34-66%)  N/A N/A Treatment Notes Electronic Signature(s) Signed: 01/04/2021 12:56:33 PM By: Levora Dredge Entered By: Levora Dredge on 12/30/2020 09:13:21 Thackeray, Taegen M. ([id number]) -------------------------------------------------------------------------------- [place] Details Patient Name: Janet [name]. Date of Service: 12/30/2020 8:15 AM Medical Record Number: [id number] Patient Account Number: [id number] Date of Birth/Sex: January 19, 1950 (70 y.o. F) Treating RN: Levora Dredge Primary Care Evanne Matsunaga: Delight Stare Other Clinician: Referring Kiera Hussey: Delight Stare Treating Labrisha Wuellner/Extender: Skipper Cliche in Treatment: 17 Active Inactive Wound/Skin Impairment Nursing Diagnoses: Impaired tissue integrity Knowledge deficit related to smoking impact on wound healing Knowledge deficit related to ulceration/compromised skin integrity Goals: Patient/caregiver will verbalize understanding of skin care regimen Date Initiated: 08/29/2020 Date Inactivated: 09/20/2020 Target Resolution Date: 09/14/2020 Goal Status: Met Ulcer/skin breakdown will have a volume reduction of 30% by week 4 Date Initiated: 08/29/2020 Date Inactivated: 10/17/2020 Target Resolution Date: 09/29/2020 Goal Status: Met Ulcer/skin breakdown will have a volume reduction of 50% by week 8 Date Initiated: 08/29/2020 Date Inactivated: 11/14/2020 Target Resolution Date: 10/29/2020 Goal Status: Met Ulcer/skin breakdown will have a volume reduction of 80% by week 12 Date Initiated: 08/29/2020 Target Resolution Date: 11/29/2020 Goal Status: Active Ulcer/skin breakdown will heal within 14 weeks Date Initiated: 08/29/2020 Target Resolution Date: 12/14/2020 Goal Status: Active Interventions: Assess patient/caregiver ability to obtain necessary supplies Assess patient/caregiver ability to perform ulcer/skin care regimen upon admission and as needed Assess ulceration(s) every visit Provide education on ulcer and  skin care Notes: Electronic Signature(s) Signed: 01/04/2021 12:56:33 PM By: Levora Dredge Entered By: Levora Dredge on 12/30/2020 09:13:10 Kuenzel, Eliya M. ([id number]) -------------------------------------------------------------------------------- Pain Assessment Details Patient Name: Janet [name]. Date of Service: 12/30/2020 8:15 AM Medical Record Number: [id number] Patient Account Number: [id number] Date of Birth/Sex: [birth year] (70 y.o. F) Treating RN: Levora Dredge Primary Care Carlye Panameno: Delight Stare Other Clinician: Referring Leandre Wien: Delight Stare Treating Yanelle Sousa/Extender: Skipper Cliche in Treatment: 17 Active Problems Location of Pain Severity and Description of Pain Patient Has Paino Yes Site Locations Rate the pain. Current Pain Level: 5 Pain Management and Medication Current Pain Management: Notes pt states pain not that bad Electronic Signature(s) Signed: 01/04/2021 12:56:33 PM By: Levora Dredge Entered By: Levora Dredge on 12/30/2020 08:28:48 Stegenga, Ivalee M. ([id number]) -------------------------------------------------------------------------------- Patient/Caregiver Education Details Patient Name: Janet [name]. Date of Service: 12/30/2020 8:15 AM Medical Record Number: [id number] Patient Account Number: [id number] Date of Birth/Gender: [birth year] (70 y.o. F) Treating RN: Levora Dredge Primary Care Physician: Delight Stare Other Clinician: Referring Physician: Delight Stare Treating  Physician/Extender: Skipper Cliche in Treatment: 17 Education Assessment Education Provided To: Patient Education Topics Provided Wound/Skin Impairment: Handouts: Caring for Your Ulcer Methods: Explain/Verbal Responses: State content correctly Electronic Signature(s) Signed: 01/04/2021 12:56:33 PM By: Levora Dredge Entered By: Levora Dredge on 12/30/2020 09:19:22 [place], Nakeda M.  ([id number]) -------------------------------------------------------------------------------- Wound Assessment Details Patient Name: Janet [name]. Date of Service: 12/30/2020 8:15 AM Medical Record Number: [id number] Patient Account Number: [id number] Date of Birth/Sex: 1950/07/30 (70 y.o. F) Treating RN: Levora Dredge Primary Care Phaedra Colgate: Delight Stare Other Clinician: Referring Devi Hopman: Delight Stare Treating Esmay Amspacher/Extender: Skipper Cliche in Treatment: 17 Wound Status Wound Number: 1 Primary Etiology: Venous Leg Ulcer Wound Location: Left, Distal, Anterior Lower Leg Secondary Etiology: Lymphedema Wounding Event: Blister Wound Status: Open Date Acquired: 05/15/2020 Comorbid History: Asthma, Hypertension Weeks Of Treatment: 17 Clustered Wound: No Photos Wound Measurements Length: (cm) 0.2 Width: (cm) 10.2 Depth: (cm) 0.1 Area: (cm) 1.602 Volume: (cm) 0.16 % Reduction in Area: 73.2% % Reduction in Volume: 73.2% Epithelialization: Medium (34-66%) Tunneling: No Undermining: No Wound Description Classification: Full Thickness Without Exposed Support Structu Exudate Amount: Medium Exudate Type: Serosanguineous Exudate Color: red, brown res Foul Odor After Cleansing: No Slough/Fibrino Yes Wound Bed Granulation Amount: Large (67-100%) Exposed Structure Granulation Quality: Red, Hyper-granulation Fascia Exposed: No Necrotic Amount: Small (1-33%) Fat Layer (Subcutaneous Tissue) Exposed: Yes Tendon Exposed: No Muscle Exposed: No Joint Exposed: No Bone Exposed: No Treatment Notes Wound #1 (Lower Leg) Wound Laterality: Left, Anterior, Distal Cleanser Soap and Water Discharge Instruction: Gently cleanse wound with antibacterial soap, rinse and pat dry prior to dressing wounds Peri-Wound Care Moore, Lemya M. ([id number]) Topical Primary Dressing Curad Oil Emulsion Dressing 3x3 (in/in) Secondary Dressing ABD Pad 5x9 (in/in) Discharge Instruction: Cover with ABD  pad Secured With Compression Wrap Profore Lite LF 3 Multilayer Compression Bandaging System Discharge Instruction: Apply 3 multi-layer wrap as prescribed. Compression Stockings Add-Ons Electronic Signature(s) Signed: 01/04/2021 12:56:33 PM By: Levora Dredge Entered By: Levora Dredge on 12/30/2020 09:01:08 Haser, Marializ M. ([id number]) -------------------------------------------------------------------------------- Vitals Details Patient Name: Janet [name]. Date of Service: 12/30/2020 8:15 AM Medical Record Number: [id number] Patient Account Number: [id number] Date of Birth/Sex: 04-14-1950 (70 y.o. F) Treating RN: Levora Dredge Primary Care Razan Siler: Delight Stare Other Clinician: Referring Marquin Patino: Delight Stare Treating Kamiyah Kindel/Extender: Skipper Cliche in Treatment: 17 Vital Signs Time Taken: 08:26 Temperature (F): 98.3 Height (in): 66 Pulse (bpm): 84 Weight (lbs): 192 Respiratory Rate (breaths/min): 18 Body Mass Index (BMI): 31 Blood Pressure (mmHg): 149/79 Reference Range: 80 - 120 mg / dl Electronic Signature(s) Signed: 01/04/2021 12:56:33 PM By: Levora Dredge Entered By: Levora Dredge on 12/30/2020 16:60:60

## 2021-01-06 ENCOUNTER — Encounter: Payer: Medicare Other | Admitting: Physician Assistant

## 2021-01-06 ENCOUNTER — Other Ambulatory Visit: Payer: Self-pay

## 2021-01-06 DIAGNOSIS — E11622 Type 2 diabetes mellitus with other skin ulcer: Secondary | ICD-10-CM | POA: Diagnosis not present

## 2021-01-06 NOTE — Progress Notes (Signed)
Janet [name], Janet [name] ([id number]) Visit Report for 01/06/2021 Arrival Information Details Patient Name: Janet [name], Janet [name]. Date of Service: 01/06/2021 8:15 AM Medical Record Number: [id number] Patient Account Number: [id number] Date of Birth/Sex: 08/06/1950 (70 y.o. F) Treating RN: Levora Dredge Primary Care Dekota Shenk: Delight Stare Other Clinician: Referring Patsie Mccardle: Delight Stare Treating Graelyn Bihl/Extender: Skipper Cliche in Treatment: 37 Visit Information History Since Last Visit Added or deleted any medications: No Patient Arrived: Ambulatory Any new allergies or adverse reactions: No Arrival Time: 08:15 Had a fall or experienced change in No Accompanied By: self activities of daily living that may affect Transfer Assistance: None risk of falls: Patient Identification Verified: Yes Hospitalized since last visit: No Secondary Verification Process Completed: Yes Has Dressing in Place as Prescribed: Yes Patient Requires Transmission-Based No Has Compression in Place as Prescribed: Yes Precautions: Pain Present Now: Yes Patient Has Alerts: Yes Patient Alerts: Patient on Blood Thinner Aspirin 81mg  AVVS ABI/TBI 7/22 ABI L- 1.15/ R- 1.08 Electronic Signature(s) Signed: 01/06/2021 12:18:41 PM By: Levora Dredge Entered By: Levora Dredge on 01/06/2021 08:18:33 Pogosyan, Edythe M. ([id number]) -------------------------------------------------------------------------------- Clinic Level of Care Assessment Details Patient Name: Janet [name]. Date of Service: 01/06/2021 8:15 AM Medical Record Number: [id number] Patient Account Number: [id number] Date of Birth/Sex: Sep 06, 1950 (70 y.o. F) Treating RN: Levora Dredge Primary Care Jibri Schriefer: Delight Stare Other Clinician: Referring Nykira Reddix: Delight Stare Treating Aubriegh Minch/Extender: Skipper Cliche in Treatment: 18 Clinic Level of Care Assessment Items TOOL 4 Quantity Score X - Use when only an EandM is performed on FOLLOW-UP visit 1  0 ASSESSMENTS - Nursing Assessment / Reassessment []  - Reassessment of Co-morbidities (includes updates in patient status) 0 []  - 0 Reassessment of Adherence to Treatment Plan ASSESSMENTS - Wound and Skin Assessment / Reassessment X - Simple Wound Assessment / Reassessment - one wound 1 5 []  - 0 Complex Wound Assessment / Reassessment - multiple wounds []  - 0 Dermatologic / Skin Assessment (not related to wound area) ASSESSMENTS - Focused Assessment []  - Circumferential Edema Measurements - multi extremities 0 []  - 0 Nutritional Assessment / Counseling / Intervention []  - 0 Lower Extremity Assessment (monofilament, tuning fork, pulses) []  - 0 Peripheral Arterial Disease Assessment (using hand held doppler) ASSESSMENTS - Ostomy and/or Continence Assessment and Care []  - Incontinence Assessment and Management 0 []  - 0 Ostomy Care Assessment and Management (repouching, etc.) PROCESS - Coordination of Care X - Simple Patient / Family Education for ongoing care 1 15 []  - 0 Complex (extensive) Patient / Family Education for ongoing care []  - 0 Staff obtains Programmer, systems, Records, Test Results / Process Orders []  - 0 Staff telephones HHA, Nursing Homes / Clarify orders / etc []  - 0 Routine Transfer to another Facility (non-emergent condition) []  - 0 Routine Hospital Admission (non-emergent condition) []  - 0 New Admissions / Biomedical engineer / Ordering NPWT, Apligraf, etc. []  - 0 Emergency Hospital Admission (emergent condition) X- 1 10 Simple Discharge Coordination []  - 0 Complex (extensive) Discharge Coordination PROCESS - Special Needs []  - Pediatric / Minor Patient Management 0 []  - 0 Isolation Patient Management []  - 0 Hearing / Language / Visual special needs []  - 0 Assessment of Community assistance (transportation, D/C planning, etc.) []  - 0 Additional assistance / Altered mentation []  - 0 Support Surface(s) Assessment (bed, cushion, seat,  etc.) INTERVENTIONS - Wound Cleansing / Measurement Van, Aryona M. ([id number]) X- 1 5 Simple Wound Cleansing - one wound []  - 0 Complex Wound Cleansing - multiple wounds X- 1 5 Wound  Imaging (photographs - any number of wounds) []  - 0 Wound Tracing (instead of photographs) []  - 0 Simple Wound Measurement - one wound []  - 0 Complex Wound Measurement - multiple wounds INTERVENTIONS - Wound Dressings X - Small Wound Dressing one or multiple wounds 1 10 []  - 0 Medium Wound Dressing one or multiple wounds []  - 0 Large Wound Dressing one or multiple wounds []  - 0 Application of Medications - topical []  - 0 Application of Medications - injection INTERVENTIONS - Miscellaneous []  - External ear exam 0 []  - 0 Specimen Collection (cultures, biopsies, blood, body fluids, etc.) []  - 0 Specimen(s) / Culture(s) sent or taken to Lab for analysis []  - 0 Patient Transfer (multiple staff / Civil Service fast streamer / Similar devices) []  - 0 Simple Staple / Suture removal (25 or less) []  - 0 Complex Staple / Suture removal (26 or more) []  - 0 Hypo / Hyperglycemic Management (close monitor of Blood Glucose) []  - 0 Ankle / Brachial Index (ABI) - do not check if billed separately X- 1 5 Vital Signs Has the patient been seen at the hospital within the last three years: Yes Total Score: 55 Level Of Care: New/Established - Level 2 Electronic Signature(s) Signed: 01/06/2021 12:18:41 PM By: Levora Dredge Entered By: Levora Dredge on 01/06/2021 08:53:29 Suhre, Logen M. ([id number]) -------------------------------------------------------------------------------- Encounter Discharge Information Details Patient Name: Janet [name]. Date of Service: 01/06/2021 8:15 AM Medical Record Number: [id number] Patient Account Number: [id number] Date of Birth/Sex: [birth year] (70 y.o. F) Treating RN: Levora Dredge Primary Care Evangela Heffler: Delight Stare Other Clinician: Referring Lajuane Leatham: Delight Stare Treating  Everline Mahaffy/Extender: Skipper Cliche in Treatment: 18 Encounter Discharge Information Items Discharge Condition: Stable Ambulatory Status: Ambulatory Discharge Destination: Home Transportation: Private Auto Accompanied By: self Schedule Follow-up Appointment: No Clinical Summary of Care: Notes wound healed, discharge from wound center Electronic Signature(s) Signed: 01/06/2021 12:18:41 PM By: Levora Dredge Entered By: Levora Dredge on 01/06/2021 08:56:17 Sherbert, Tonea M. ([id number]) -------------------------------------------------------------------------------- Lower Extremity Assessment Details Patient Name: Janet [name]. Date of Service: 01/06/2021 8:15 AM Medical Record Number: [id number] Patient Account Number: [id number] Date of Birth/Sex: [birth year] (70 y.o. F) Treating RN: Levora Dredge Primary Care Chancellor Vanderloop: Delight Stare Other Clinician: Referring Ivylynn Hoppes: Delight Stare Treating Arkie Tagliaferro/Extender: Skipper Cliche in Treatment: 18 Edema Assessment Assessed: [Left: No] [Right: No] Edema: [Left: N] [Right: o] Calf Left: Right: Point of Measurement: 29 cm From Medial Instep 37 cm Ankle Left: Right: Point of Measurement: 10 cm From Medial Instep 23.2 cm Vascular Assessment Pulses: Dorsalis Pedis Palpable: [Left:Yes] Electronic Signature(s) Signed: 01/06/2021 12:18:41 PM By: Levora Dredge Entered By: Levora Dredge on 01/06/2021 08:30:20 Kallal, Josilyn M. ([id number]) -------------------------------------------------------------------------------- Multi Wound Chart Details Patient Name: Janet [name]. Date of Service: 01/06/2021 8:15 AM Medical Record Number: [id number] Patient Account Number: [id number] Date of Birth/Sex: 1950-03-27 (70 y.o. F) Treating RN: Levora Dredge Primary Care Jacara Benito: Delight Stare Other Clinician: Referring Kaelen Brennan: Delight Stare Treating Ramah Langhans/Extender: Skipper Cliche in Treatment: 18 Vital Signs Height(in):  66 Pulse(bpm): 88 Weight(lbs): 192 Blood Pressure(mmHg): 122/64 Body Mass Index(BMI): 31 Temperature(F): 98.1 Respiratory Rate(breaths/min): 18 Photos: [N/A:N/A] Wound Location: Left, Distal, Anterior Lower Leg N/A N/A Wounding Event: Blister N/A N/A Primary Etiology: Venous Leg Ulcer N/A N/A Secondary Etiology: Lymphedema N/A N/A Comorbid History: Asthma, Hypertension N/A N/A Date Acquired: 05/15/2020 N/A N/A Weeks of Treatment: 18 N/A N/A Wound Status: Open N/A N/A Measurements L x W x D (cm) 0.1x0.1x0.1 N/A N/A Area (cm) : 0.008 N/A N/A Volume (cm) :  0.001 N/A N/A % Reduction in Area: 99.90% N/A N/A % Reduction in Volume: 99.80% N/A N/A Classification: Full Thickness Without Exposed N/A N/A Support Structures Exudate Amount: Small N/A N/A Exudate Type: Serosanguineous N/A N/A Exudate Color: red, brown N/A N/A Granulation Amount: Large (67-100%) N/A N/A Granulation Quality: Red, Hyper-granulation N/A N/A Necrotic Amount: Small (1-33%) N/A N/A Necrotic Tissue: Eschar N/A N/A Exposed Structures: Fat Layer (Subcutaneous Tissue): N/A N/A Yes Fascia: No Tendon: No Muscle: No Joint: No Bone: No Epithelialization: Large (67-100%) N/A N/A Assessment Notes: dried drainage noted on wound N/A N/A Treatment Notes Electronic Signature(s) Signed: 01/06/2021 12:18:41 PM By: Levora Dredge Entered By: Levora Dredge on 01/06/2021 08:50:14 Trant, Phyliss M. ([id number]) -------------------------------------------------------------------------------- Multi-Disciplinary Care Plan Details Patient Name: Janet [name]. Date of Service: 01/06/2021 8:15 AM Medical Record Number: [id number] Patient Account Number: [id number] Date of Birth/Sex: [birth year] (70 y.o. F) Treating RN: Levora Dredge Primary Care Infantof Villagomez: Delight Stare Other Clinician: Referring Lizvette Lightsey: Delight Stare Treating Brandin Dilday/Extender: Skipper Cliche in Treatment: 18 Active Inactive Electronic  Signature(s) Signed: 01/06/2021 9:54:16 AM By: Levora Dredge Entered By: Levora Dredge on 01/06/2021 09:54:16 Karle, Ilham M. ([id number]) -------------------------------------------------------------------------------- Pain Assessment Details Patient Name: Janet [name]. Date of Service: 01/06/2021 8:15 AM Medical Record Number: [id number] Patient Account Number: [id number] Date of Birth/Sex: Aug 10, 1950 (70 y.o. F) Treating RN: Levora Dredge Primary Care Kadra Kohan: Delight Stare Other Clinician: Referring Seyed Heffley: Delight Stare Treating Catalaya Garr/Extender: Skipper Cliche in Treatment: 18 Active Problems Location of Pain Severity and Description of Pain Patient Has Paino Yes Site Locations Rate the pain. Current Pain Level: 3 Pain Management and Medication Current Pain Management: Electronic Signature(s) Signed: 01/06/2021 12:18:41 PM By: Levora Dredge Entered By: Levora Dredge on 01/06/2021 08:20:13 Prazak, Keziyah M. ([id number]) -------------------------------------------------------------------------------- Patient/Caregiver Education Details Patient Name: Janet [name]. Date of Service: 01/06/2021 8:15 AM Medical Record Number: [id number] Patient Account Number: [id number] Date of Birth/Gender: [birth year] (70 y.o. F) Treating RN: Levora Dredge Primary Care Physician: Delight Stare Other Clinician: Referring Physician: Delight Stare Treating Physician/Extender: Skipper Cliche in Treatment: 18 Education Assessment Education Provided To: Patient Education Topics Provided Wound/Skin Impairment: Handouts: Caring for Your Ulcer Methods: Explain/Verbal Responses: State content correctly Electronic Signature(s) Signed: 01/06/2021 12:18:41 PM By: Levora Dredge Entered By: Levora Dredge on 01/06/2021 08:54:53 Lua, Almeter M. ([id number]) -------------------------------------------------------------------------------- Wound Assessment Details Patient Name: Janet [name]. Date of Service: 01/06/2021 8:15 AM Medical Record Number: [id number] Patient Account Number: [id number] Date of Birth/Sex: [birth year] (70 y.o. F) Treating RN: Levora Dredge Primary Care Jerusalen Mateja: Delight Stare Other Clinician: Referring Chadric Kimberley: Delight Stare Treating Kevina Piloto/Extender: Skipper Cliche in Treatment: 18 Wound Status Wound Number: 1 Primary Etiology: Venous Leg Ulcer Wound Location: Left, Distal, Anterior Lower Leg Secondary Etiology: Lymphedema Wounding Event: Blister Wound Status: Healed - Epithelialized Date Acquired: 05/15/2020 Comorbid History: Asthma, Hypertension Weeks Of Treatment: 18 Clustered Wound: No Photos Wound Measurements Length: (cm) Width: (cm) Depth: (cm) Area: (cm) Volume: (cm) 0 % Reduction in Area: 100% 0 % Reduction in Volume: 100% 0 Epithelialization: Large (67-100%) 0 Tunneling: No 0 Undermining: No Wound Description Classification: Full Thickness Without Exposed Support Structu Exudate Amount: Small Exudate Type: Serosanguineous Exudate Color: red, brown res Foul Odor After Cleansing: No Slough/Fibrino Yes Wound Bed Granulation Amount: Large (67-100%) Exposed Structure Granulation Quality: Red, Hyper-granulation Fascia Exposed: No Necrotic Amount: Small (1-33%) Fat Layer (Subcutaneous Tissue) Exposed: Yes Necrotic Quality: Eschar Tendon Exposed: No Muscle Exposed: No Joint Exposed: No Bone Exposed: No Assessment Notes dried drainage noted on wound  Treatment Notes Wound #1 (Lower Leg) Wound Laterality: Left, Anterior, Distal Cleanser Peri-Wound Care ALLEX, MADIA ([id number]) Topical Primary Dressing Secondary Dressing Secured With Compression Wrap Compression Stockings Add-Ons Electronic Signature(s) Signed: 01/06/2021 12:18:41 PM By: Levora Dredge Entered By: Levora Dredge on 01/06/2021 08:50:46 Faiella, Karman M.  ([id number]) -------------------------------------------------------------------------------- Vitals Details Patient Name: Janet [name]. Date of Service: 01/06/2021 8:15 AM Medical Record Number: [id number] Patient Account Number: [id number] Date of Birth/Sex: 1950/09/10 (70 y.o. F) Treating RN: Levora Dredge Primary Care Kelson Queenan: Delight Stare Other Clinician: Referring Raelin Pixler: Delight Stare Treating Marguerite Jarboe/Extender: Skipper Cliche in Treatment: 18 Vital Signs Time Taken: 08:19 Temperature (F): 98.1 Height (in): 66 Pulse (bpm): 88 Weight (lbs): 192 Respiratory Rate (breaths/min): 18 Body Mass Index (BMI): 31 Blood Pressure (mmHg): 122/64 Reference Range: 80 - 120 mg / dl Electronic Signature(s) Signed: 01/06/2021 12:18:41 PM By: Levora Dredge Entered By: Levora Dredge on 01/06/2021 08:20:03

## 2021-01-06 NOTE — Progress Notes (Addendum)
Janet [name], Janet [name] ([id number]) Visit Report for 01/06/2021 Chief Complaint Document Details Patient Name: Janet [name], Janet [name]. Date of Service: 01/06/2021 8:15 AM Medical Record Number: [id number] Patient Account Number: [id number] Date of Birth/Sex: Mar 31, 1950 (70 y.o. F) Treating RN: Cornell Barman Primary Care Provider: Delight Stare Other Clinician: Referring Provider: Delight Stare Treating Provider/Extender: Skipper Cliche in Treatment: 18 Information Obtained from: Patient Chief Complaint Left LE Ulcers Electronic Signature(s) Signed: 01/06/2021 8:19:47 AM By: Worthy Keeler PA-C Entered By: Worthy Keeler on 01/06/2021 08:19:47 Janet [name], [name] ([id number]) -------------------------------------------------------------------------------- HPI Details Patient Name: Janet [name]. Date of Service: 01/06/2021 8:15 AM Medical Record Number: [id number] Patient Account Number: [id number] Date of Birth/Sex: 06-17-1950 (70 y.o. F) Treating RN: Cornell Barman Primary Care Provider: Delight Stare Other Clinician: Referring Provider: Delight Stare Treating Provider/Extender: Skipper Cliche in Treatment: 18 History of Present Illness HPI Description: 08/29/2020 upon evaluation today patient appears to be doing somewhat poorly in regard to what appeared to be venous leg ulcers on the left leg that she has been dealing with since around the beginning of May. Fortunately she does not have any signs of obvious infection unfortunately she does have quite a bit of necrotic tissue that does not really need to be debrided. She has been seeing vascular and they did wrap her for a time with what was likely an Haematologist. With that being said the [facility] boot has given her some trouble as far as feeling claustrophobic was concerned she is not sure if she is good to be able to tolerate a compression wrap currently although we will get a try she is in agreement with Aleve given this shot. Fortunately I do not see any signs  of active infection systemically which is great news and even locally she seems to be doing quite well today. Patient's Right ABI is 1.08 with a TBI of 0.79 and the left ABI is 1.15 with a TBI of 0.84 on 08/03/20. She also has a history of chronic venous insufficiency and lymphedema but otherwise no major medical problems. 09/05/2020 upon evaluation today patient unfortunately appears to be having some issues today with her right leg this is completely different from what we have been taking care of on the left. She actually has some swelling and an area of erythema and pain which is in the medial calf region. There is no identifiable abscess definitely this could just be cellulitis but with her history of DVT I think that we need to have this checked out. 09/12/2020 upon evaluation today patient appears to be doing better in regard to her wound. She has been tolerating the dressing changes without complication. Fortunately there does not appear to be any signs of active infection which is great news and overall I think that the patient is doing well in this regard. Her wounds are measuring smaller at both locations and the surface of the wound is looking better. With that being said she does have a wedding that she is supposed to be going to this weekend and states that she really needs to not have the wrap on for that. Nonetheless regular and discuss options to try to help maintain while she has this time with the wedding. 09/20/2020 upon evaluation today patient unfortunately is doing worse in regard to the pain associated with her wound at this time. Fortunately there does not appear to be any evidence of active infection systemically which is great news but in general I feel like locally she may  definitely be infected. Subsequently I do believe that we need to address this as soon as possible. 09/26/2020 upon evaluation today patient appears to be doing well with regard to her wound. Fortunately there  does not appear to be any signs of worsening in general. I think that overall the patient is making wonderful progress in my opinion. The right leg where she had an area of erythema medially also is better today which is great news. I think Levaquin has done a great job in that regard. We did see evidence still some erythema on the right leg but again this is significantly improved compared to last week. Overall the Levaquin has done a great job in this regard. 10/03/2020 upon evaluation today patient appears to be doing well all things considered with regard to her wounds. There does not appear to be any signs of infection obvious at this point which is great news. Fortunately I think that she is definitely making progress with regard to the appearance of the wound bed but nonetheless is still having quite a bit of an issue here with the pain aspect. Fortunately I think that we have some other options to consider him and use triamcinolone underneath the [facility] today. Also think looking into PuraPly/Apligraf as a treatment could be appropriate for this patient and my hope is that we will be able to get the wounds growing in a much faster pace as far as new tissue is concerned to get this closed sooner rather than later as that is the only way that we will get a get the pain completely resolved for her which is ultimately the goal we have as well obviously. 9/26; 2 wounds on the left anterior lower leg in the setting of what appears to be severe chronic venous insufficiency. We have been using Hydrofera Blue and 3 layer compression. 10/17/2020 upon evaluation today patient appears to be doing well with regard to her leg ulcer. She has been tolerating the dressing changes without complication. Fortunately there does not appear to be any signs of active infection at this time. No fevers, chills, nausea, vomiting, or diarrhea. 10/10; 2 wounds on the left lateral calf. She has been using Hydrofera  Blue. Still complains of pain. She is on her second week of that was started last week. 10/31/2020 upon evaluation today patient's wound bed actually showed signs of good granulation and epithelization at this point. Fortunately there does not appear to be any signs of active infection which is great news and overall very pleased with where we stand. I do think that the patient is making excellent progress based on what we are seeing I believe the triamcinolone along with a Hydrofera Blue is doing a great job. 11/07/2020 upon evaluation today patient appears to be doing well with regard to her wounds. This is going slowly but nonetheless I believe that the fact that she is responding to the triamcinolone and is doing better is a good sign I also believe this is a sign that were definitely dealing with more of an inflammatory process. Nonetheless I am extremely happy with where things stand today. 11/14/2020 upon evaluation today patient appears to be doing well with regard to her wounds. The more superior wound actually appears to be almost completely closed. The inferior area is measuring smaller but still open she still has a fairly significant amount of pain. 11/21/2020 upon inspection patient's wound bed actually showed signs of good granulation epithelization at this point. Fortunately there does not  appear to be any evidence of active infection systemically which is great news and overall I am actually extremely pleased with where we stand. I do believe that the patient is making good progress in fact the wound seem to be showing signs of shrinking week by week and the progress is fairly dramatic. The proximal wound does appear to likely be healed today. 11/28/2020 upon inspection patient's wound bed actually showed signs of good granulation epithelization at this point. Fortunately I do not see any evidence of infection currently which is great news and overall I think that she is making  excellent progress. SHYTERIA, LEWIS ([id number]) 12/05/2020 upon evaluation today patient appears to be doing well with regard to her leg ulcer. I am definitely showing signs of improvement here which is great news. I do not see any evidence of infection and I think we are headed in the right direction. With that being said I think the patient could be approved and evaluated for Apligraf if approved and we could see about what we could do in regard to getting this applied even for next week. Again some of this will depend on timing being that it is a holiday may have to be the following week. Either way I do think this could be beneficial in trying to help get this healed much more effectively and quickly. The patient is in agreement with giving this a trial. 12/15/2020 upon evaluation today patient appears to be doing decently well in regard to her wound. I am seeing some signs of improvement. I am hopeful that the Apligraf will help this distillate much more effectively and quickly. She is making progress but is been a very slow progress up to this point. 12/23/2020 upon evaluation today patient actually appears to be doing excellent in regard to her wound on the leg this is very close to complete resolution I think the Apligraf is doing an awesome job for her to be honest. This was her first application today is the second. 12/30/2020 upon evaluation today patient actually appears to be doing awesome in regard to her wound. At this point Apligraf has done an amazing job in my opinion. In fact she is pretty much completely healed there is a small area that still open currently and to be honest I think that this is a minimal location. Fortunately I do not see any signs of active infection locally nor systemically at this point. 01/06/2021 upon evaluation today patient appears to be doing well with regard to her wound in fact this appears to be completely healed which is great news. Fortunately I do not  see any signs of active infection at this time. No fevers, chills, nausea, vomiting, or diarrhea. Electronic Signature(s) Signed: 01/06/2021 9:01:43 AM By: Worthy Keeler PA-C Entered By: Worthy Keeler on 01/06/2021 09:01:43 Janet [name], [name] ([id number]) -------------------------------------------------------------------------------- Physical Exam Details Patient Name: Janet [name]. Date of Service: 01/06/2021 8:15 AM Medical Record Number: [id number] Patient Account Number: [id number] Date of Birth/Sex: Sep 28, 1950 (70 y.o. F) Treating RN: Cornell Barman Primary Care Provider: Delight Stare Other Clinician: Referring Provider: Delight Stare Treating Provider/Extender: Skipper Cliche in Treatment: 67 Constitutional Well-nourished and well-hydrated in no acute distress. Respiratory normal breathing without difficulty. Psychiatric this patient is able to make decisions and demonstrates good insight into disease process. Alert and Oriented x 3. pleasant and cooperative. Notes Upon inspection patient's wound bed showed signs of good epithelization at this point. Fortunately I think that she is making all  some progress and overall this appears to be healed which is great she does have her juxta light compression wrap and get that on her today as well. Electronic Signature(s) Signed: 01/06/2021 9:02:05 AM By: Worthy Keeler PA-C Entered By: Worthy Keeler on 01/06/2021 09:02:05 Janet [name], [name] ([id number]) -------------------------------------------------------------------------------- Physician Orders Details Patient Name: Janet [name]. Date of Service: 01/06/2021 8:15 AM Medical Record Number: [id number] Patient Account Number: [id number] Date of Birth/Sex: 02-Oct-1950 (70 y.o. F) Treating RN: Levora Dredge Primary Care Provider: Delight Stare Other Clinician: Referring Provider: Delight Stare Treating Provider/Extender: Skipper Cliche in Treatment: 18 Verbal / Phone Orders:  No Diagnosis Coding ICD-10 Coding Code Description I87.2 Venous insufficiency (chronic) (peripheral) I89.0 Lymphedema, not elsewhere classified 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 of other part of left lower leg with fat layer exposed Discharge From [facility] o Discharge from [facility] Complete Follow-up Appointments o Nurse Visit as needed Edema Control - Lymphedema / Segmental Compressive Device / Other o Patient to wear own Velcro compression garment. Remove compression stockings every night before going to bed and put on every morning when getting up. - "JUXTALITE"-wear during the day and can remove at nighttime. Apply lotion o Elevate legs to the level of the heart and pump ankles as often as possible o Elevate leg(s) parallel to the floor when sitting. o DO YOUR BEST to sleep in the bed at night. DO NOT sleep in your recliner. Long hours of sitting in a recliner leads to swelling of the legs and/or potential wounds on your backside. Additional Orders / Instructions o Follow Nutritious Diet and Increase Protein Intake Medications-Please add to medication list. o Take one 500mg  Tylenol (Acetaminophen) and one 200mg  Motrin (Ibuprofen) every 6 hours for pain. Do not take ibuprofen if you are on blood thinners or have stomach ulcers. - as needed for pain if not contraindicated by other physician Electronic Signature(s) Signed: 01/06/2021 12:10:22 PM By: Worthy Keeler PA-C Signed: 01/06/2021 12:18:41 PM By: Levora Dredge Entered By: Levora Dredge on 01/06/2021 08:52:46 Janet [name], [name] ([id number]) -------------------------------------------------------------------------------- Problem List Details Patient Name: Janet [name]. Date of Service: 01/06/2021 8:15 AM Medical Record Number: [id number] Patient Account Number: [id number] Date of Birth/Sex: 05-05-1950 (70 y.o. F) Treating RN: Cornell Barman Primary Care Provider: Delight Stare Other  Clinician: Referring Provider: Delight Stare Treating Provider/Extender: Skipper Cliche in Treatment: 18 Active Problems ICD-10 Encounter Code Description Active Date MDM Diagnosis I87.2 Venous insufficiency (chronic) (peripheral) 08/29/2020 No Yes I89.0 Lymphedema, not elsewhere classified 08/29/2020 No Yes 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 of other part of left lower leg with fat layer 08/29/2020 No Yes exposed Inactive Problems ICD-10 Code Description Active Date Inactive Date L03.115 Cellulitis of right lower limb 09/05/2020 09/05/2020 R22.41 Localized swelling, mass and lump, right lower limb 09/05/2020 09/05/2020 Resolved Problems Electronic Signature(s) Signed: 01/06/2021 8:19:40 AM By: Worthy Keeler PA-C Entered By: Worthy Keeler on 01/06/2021 08:19:40 Janet [name], [name] ([id number]) -------------------------------------------------------------------------------- Progress Note Details Patient Name: Janet [name]. Date of Service: 01/06/2021 8:15 AM Medical Record Number: [id number] Patient Account Number: [id number] Date of Birth/Sex: [birth year] (70 y.o. F) Treating RN: Cornell Barman Primary Care Provider: Delight Stare Other Clinician: Referring Provider: Delight Stare Treating Provider/Extender: Skipper Cliche in Treatment: 18 Subjective Chief Complaint Information obtained from Patient Left LE Ulcers History of Present Illness (HPI) 08/29/2020 upon evaluation today patient appears to be doing somewhat poorly in regard to what appeared to be venous leg ulcers on the left leg that  she has been dealing with since around the beginning of May. Fortunately she does not have any signs of obvious infection unfortunately she does have quite a bit of necrotic tissue that does not really need to be debrided. She has been seeing vascular and they did wrap her for a time with what was likely an Haematologist. With that being said the [facility] boot has given her some trouble as far as feeling  claustrophobic was concerned she is not sure if she is good to be able to tolerate a compression wrap currently although we will get a try she is in agreement with Aleve given this shot. Fortunately I do not see any signs of active infection systemically which is great news and even locally she seems to be doing quite well today. Patient's Right ABI is 1.08 with a TBI of 0.79 and the left ABI is 1.15 with a TBI of 0.84 on 08/03/20. She also has a history of chronic venous insufficiency and lymphedema but otherwise no major medical problems. 09/05/2020 upon evaluation today patient unfortunately appears to be having some issues today with her right leg this is completely different from what we have been taking care of on the left. She actually has some swelling and an area of erythema and pain which is in the medial calf region. There is no identifiable abscess definitely this could just be cellulitis but with her history of DVT I think that we need to have this checked out. 09/12/2020 upon evaluation today patient appears to be doing better in regard to her wound. She has been tolerating the dressing changes without complication. Fortunately there does not appear to be any signs of active infection which is great news and overall I think that the patient is doing well in this regard. Her wounds are measuring smaller at both locations and the surface of the wound is looking better. With that being said she does have a wedding that she is supposed to be going to this weekend and states that she really needs to not have the wrap on for that. Nonetheless regular and discuss options to try to help maintain while she has this time with the wedding. 09/20/2020 upon evaluation today patient unfortunately is doing worse in regard to the pain associated with her wound at this time. Fortunately there does not appear to be any evidence of active infection systemically which is great news but in general I feel like  locally she may definitely be infected. Subsequently I do believe that we need to address this as soon as possible. 09/26/2020 upon evaluation today patient appears to be doing well with regard to her wound. Fortunately there does not appear to be any signs of worsening in general. I think that overall the patient is making wonderful progress in my opinion. The right leg where she had an area of erythema medially also is better today which is great news. I think Levaquin has done a great job in that regard. We did see evidence still some erythema on the right leg but again this is significantly improved compared to last week. Overall the Levaquin has done a great job in this regard. 10/03/2020 upon evaluation today patient appears to be doing well all things considered with regard to her wounds. There does not appear to be any signs of infection obvious at this point which is great news. Fortunately I think that she is definitely making progress with regard to the appearance of the wound bed but nonetheless  is still having quite a bit of an issue here with the pain aspect. Fortunately I think that we have some other options to consider him and use triamcinolone underneath the [facility] today. Also think looking into PuraPly/Apligraf as a treatment could be appropriate for this patient and my hope is that we will be able to get the wounds growing in a much faster pace as far as new tissue is concerned to get this closed sooner rather than later as that is the only way that we will get a get the pain completely resolved for her which is ultimately the goal we have as well obviously. 9/26; 2 wounds on the left anterior lower leg in the setting of what appears to be severe chronic venous insufficiency. We have been using Hydrofera Blue and 3 layer compression. 10/17/2020 upon evaluation today patient appears to be doing well with regard to her leg ulcer. She has been tolerating the dressing  changes without complication. Fortunately there does not appear to be any signs of active infection at this time. No fevers, chills, nausea, vomiting, or diarrhea. 10/10; 2 wounds on the left lateral calf. She has been using Hydrofera Blue. Still complains of pain. She is on her second week of that was started last week. 10/31/2020 upon evaluation today patient's wound bed actually showed signs of good granulation and epithelization at this point. Fortunately there does not appear to be any signs of active infection which is great news and overall very pleased with where we stand. I do think that the patient is making excellent progress based on what we are seeing I believe the triamcinolone along with a Hydrofera Blue is doing a great job. 11/07/2020 upon evaluation today patient appears to be doing well with regard to her wounds. This is going slowly but nonetheless I believe that the fact that she is responding to the triamcinolone and is doing better is a good sign I also believe this is a sign that were definitely dealing with more of an inflammatory process. Nonetheless I am extremely happy with where things stand today. 11/14/2020 upon evaluation today patient appears to be doing well with regard to her wounds. The more superior wound actually appears to be almost completely closed. The inferior area is measuring smaller but still open she still has a fairly significant amount of pain. 11/21/2020 upon inspection patient's wound bed actually showed signs of good granulation epithelization at this point. Fortunately there does not appear to be any evidence of active infection systemically which is great news and overall I am actually extremely pleased with where we stand. I do believe that the patient is making good progress in fact the wound seem to be showing signs of shrinking week by week and the progress is Janet [name], [name] ([id number]) fairly dramatic. The proximal wound does appear to likely  be healed today. 11/28/2020 upon inspection patient's wound bed actually showed signs of good granulation epithelization at this point. Fortunately I do not see any evidence of infection currently which is great news and overall I think that she is making excellent progress. 12/05/2020 upon evaluation today patient appears to be doing well with regard to her leg ulcer. I am definitely showing signs of improvement here which is great news. I do not see any evidence of infection and I think we are headed in the right direction. With that being said I think the patient could be approved and evaluated for Apligraf if approved and we could see about  what we could do in regard to getting this applied even for next week. Again some of this will depend on timing being that it is a holiday may have to be the following week. Either way I do think this could be beneficial in trying to help get this healed much more effectively and quickly. The patient is in agreement with giving this a trial. 12/15/2020 upon evaluation today patient appears to be doing decently well in regard to her wound. I am seeing some signs of improvement. I am hopeful that the Apligraf will help this distillate much more effectively and quickly. She is making progress but is been a very slow progress up to this point. 12/23/2020 upon evaluation today patient actually appears to be doing excellent in regard to her wound on the leg this is very close to complete resolution I think the Apligraf is doing an awesome job for her to be honest. This was her first application today is the second. 12/30/2020 upon evaluation today patient actually appears to be doing awesome in regard to her wound. At this point Apligraf has done an amazing job in my opinion. In fact she is pretty much completely healed there is a small area that still open currently and to be honest I think that this is a minimal location. Fortunately I do not see any signs of active  infection locally nor systemically at this point. 01/06/2021 upon evaluation today patient appears to be doing well with regard to her wound in fact this appears to be completely healed which is great news. Fortunately I do not see any signs of active infection at this time. No fevers, chills, nausea, vomiting, or diarrhea. Objective Constitutional Well-nourished and well-hydrated in no acute distress. Vitals Time Taken: 8:19 AM, Height: 66 in, Weight: 192 lbs, BMI: 31, Temperature: 98.1 F, Pulse: 88 bpm, Respiratory Rate: 18 breaths/min, Blood Pressure: 122/64 mmHg. Respiratory normal breathing without difficulty. Psychiatric this patient is able to make decisions and demonstrates good insight into disease process. Alert and Oriented x 3. pleasant and cooperative. General Notes: Upon inspection patient's wound bed showed signs of good epithelization at this point. Fortunately I think that she is making all some progress and overall this appears to be healed which is great she does have her juxta light compression wrap and get that on her today as well. Integumentary (Hair, Skin) Wound #1 status is Healed - Epithelialized. Original cause of wound was Blister. The date acquired was: 05/15/2020. The wound has been in treatment 18 weeks. The wound is located on the [facility] Lower Leg. The wound measures 0cm length x 0cm width x 0cm depth; 0cm^2 area and 0cm^3 volume. There is Fat Layer (Subcutaneous Tissue) exposed. There is no tunneling or undermining noted. There is a small amount of serosanguineous drainage noted. There is large (67-100%) red, hyper - granulation within the wound bed. There is a small (1-33%) amount of necrotic tissue within the wound bed including Eschar. General Notes: dried drainage noted on wound Assessment Active Problems ICD-10 Venous insufficiency (chronic) (peripheral) Lymphedema, not elsewhere classified Non-pressure chronic ulcer of other part of left  lower leg with fat layer exposed Janet [name], [name] ([id number]) Plan Discharge From [facility]: Discharge from [place] [facility] Complete Follow-up Appointments: Nurse Visit as needed Edema Control - Lymphedema / Segmental Compressive Device / Other: Patient to wear own Velcro compression garment. Remove compression stockings every night before going to bed and put on every morning when getting up. - "EQASTMHDQ"-QIWL  during the day and can remove at nighttime. Apply lotion Elevate legs to the level of the heart and pump ankles as often as possible Elevate leg(s) parallel to the floor when sitting. DO YOUR BEST to sleep in the bed at night. DO NOT sleep in your recliner. Long hours of sitting in a recliner leads to swelling of the legs and/or potential wounds on your backside. Additional Orders / Instructions: Follow Nutritious Diet and Increase Protein Intake Medications-Please add to medication list.: Take one 500mg  Tylenol (Acetaminophen) and one 200mg  Motrin (Ibuprofen) every 6 hours for pain. Do not take ibuprofen if you are on blood thinners or have stomach ulcers. - as needed for pain if not contraindicated by other physician 1. We will go ahead and recommend that we discontinue wound care services as the patient appears to be completely healed and she is in agreement with the plan. 2. Also showed her how to put on the juxta lite compression wrap. 3. I am also can recommend that she continue to elevate her legs much as possible she could lotion on at bedtime and wear the juxta light during the day. We will see her back for follow-up visit as needed. Electronic Signature(s) Signed: 01/06/2021 9:04:19 AM By: Worthy Keeler PA-C Entered By: Worthy Keeler on 01/06/2021 09:04:19 Janet [name], [name] ([id number]) -------------------------------------------------------------------------------- SuperBill Details Patient Name: Janet [name]. Date of Service: 01/06/2021 Medical  Record Number: [id number] Patient Account Number: [id number] Date of Birth/Sex: [birth year] (70 y.o. F) Treating RN: Levora Dredge Primary Care Provider: Delight Stare Other Clinician: Referring Provider: Delight Stare Treating Provider/Extender: Skipper Cliche in Treatment: 18 Diagnosis Coding ICD-10 Codes Code Description I87.2 Venous insufficiency (chronic) (peripheral) I89.0 Lymphedema, not elsewhere classified 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 of other part of left lower leg with fat layer exposed Facility Procedures CPT4 Code: 32122482 Description: [phone] - WOUND CARE VISIT-LEV 2 EST PT Modifier: Quantity: 1 Physician Procedures CPT4 Code: 0488891 Description: 69450 - WC PHYS LEVEL 3 - EST PT Modifier: Quantity: 1 CPT4 Code: Description: ICD-10 Diagnosis Description I87.2 Venous insufficiency (chronic) (peripheral) I89.0 Lymphedema, not elsewhere classified 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 of other part of left lower leg with fat lay Modifier: er exposed Quantity: Electronic Signature(s) Signed: 01/06/2021 9:08:27 AM By: Worthy Keeler PA-C Entered By: Worthy Keeler on 01/06/2021 09:08:27

## 2021-01-10 ENCOUNTER — Other Ambulatory Visit: Payer: Self-pay

## 2021-01-10 ENCOUNTER — Ambulatory Visit
Admission: EM | Admit: 2021-01-10 | Discharge: 2021-01-10 | Disposition: A | Discharge status: Home / Self Care | Payer: Medicare Other | Attending: Emergency Medicine | Admitting: Emergency Medicine

## 2021-01-10 DIAGNOSIS — B9689 Other specified bacterial agents as the cause of diseases classified elsewhere: Secondary | ICD-10-CM

## 2021-01-10 DIAGNOSIS — N760 Acute vaginitis: Secondary | ICD-10-CM | POA: Diagnosis present

## 2021-01-10 DIAGNOSIS — I440 Atrioventricular block, first degree: Secondary | ICD-10-CM

## 2021-01-10 LAB — CBC WITH DIFFERENTIAL/PLATELET
Abs Immature Granulocytes: 0.02 10*3/uL (ref 0.00–0.07)
Basophils Absolute: 0 10*3/uL (ref 0.0–0.1)
Basophils Relative: 0 %
Eosinophils Absolute: 0.2 10*3/uL (ref 0.0–0.5)
Eosinophils Relative: 3 %
HCT: 41.9 % (ref 36.0–46.0)
Hemoglobin: 13 g/dL (ref 12.0–15.0)
Immature Granulocytes: 0 %
Lymphocytes Relative: 35 %
Lymphs Abs: 2.6 10*3/uL (ref 0.7–4.0)
MCH: 29.1 pg (ref 26.0–34.0)
MCHC: 31 g/dL (ref 30.0–36.0)
MCV: 93.7 fL (ref 80.0–100.0)
Monocytes Absolute: 0.4 10*3/uL (ref 0.1–1.0)
Monocytes Relative: 6 %
Neutro Abs: 4.2 10*3/uL (ref 1.7–7.7)
Neutrophils Relative %: 56 %
Platelets: 284 10*3/uL (ref 150–400)
RBC: 4.47 MIL/uL (ref 3.87–5.11)
RDW: 12.7 % (ref 11.5–15.5)
WBC: 7.5 10*3/uL (ref 4.0–10.5)
nRBC: 0 % (ref 0.0–0.2)

## 2021-01-10 LAB — WET PREP, GENITAL
Sperm: NONE SEEN
Trich, Wet Prep: NONE SEEN
WBC, Wet Prep HPF POC: NONE SEEN — AB (ref <10)
Yeast Wet Prep HPF POC: NONE SEEN

## 2021-01-10 LAB — BASIC METABOLIC PANEL
Anion gap: 5 (ref 5–15)
BUN: 24 mg/dL — ABNORMAL HIGH (ref 8–23)
CO2: 26 mmol/L (ref 22–32)
Calcium: 9.4 mg/dL (ref 8.9–10.3)
Chloride: 107 mmol/L (ref 98–111)
Creatinine, Ser: 0.97 mg/dL (ref 0.44–1.00)
GFR, Estimated: >60 mL/min (ref >60)
Glucose, Bld: 87 mg/dL (ref 70–99)
Potassium: 3.6 mmol/L (ref 3.5–5.1)
Sodium: 138 mmol/L (ref 135–145)

## 2021-01-10 LAB — URINALYSIS, COMPLETE (UACMP) WITH MICROSCOPIC
Bilirubin Urine: NEGATIVE
Glucose, UA: NEGATIVE mg/dL
Hgb urine dipstick: NEGATIVE
Ketones, ur: NEGATIVE mg/dL
Leukocytes,Ua: NEGATIVE
Nitrite: NEGATIVE
Protein, ur: NEGATIVE mg/dL
Specific Gravity, Urine: 1.025 (ref 1.005–1.030)
pH: 5.5 (ref 5.0–8.0)

## 2021-01-10 MED ORDER — METRONIDAZOLE 500 MG PO TABS: 500.0000 mg | ORAL_TABLET | Freq: Two times a day (BID) | ORAL | 0 refills | Status: DC

## 2021-01-10 NOTE — ED Triage Notes (Signed)
Pt c/o urinary urgency, fatigue, and feeling like she can not fully urinate x2weeks.

## 2021-01-10 NOTE — Discharge Instructions (Addendum)
Take the Flagyl (metronidazole) 500 mg twice daily for treatment of your bacterial vaginosis.  Avoid alcohol while on the metronidazole as taken together will cause of vomiting.  Bacterial vaginosis is often caused by a imbalance of bacteria in your vaginal vault.  This is sometimes a result of using tampons or hormonal fluctuations during her menstrual cycle.  You if your symptoms are recurrent you can try using a boric acid suppository twice weekly to help maintain the acid-base balance in your vagina vault which could prevent further infection.  You can also try vaginal probiotics to help return normal bacterial balance.   Your EKG showed a abnormal heart rhythm, as we discussed, called a first-degree AV block.  This is new when compared to your previous EKGs.  I am referring you to cardiology in [facility] for evaluation.  If you develop any chest pain, shortness of breath, or dizziness please return for reevaluation or go to the emergency department.

## 2021-01-10 NOTE — ED Provider Notes (Addendum)
[facility]    CSN: [id number] Arrival date & time: 01/10/21  1714      History   Chief Complaint Chief Complaint  Patient presents with   Urinary Tract Infection    HPI Janet [name] is a 70 y.o. female.   HPI  70 year old female here for evaluation of urinary complaints.  Patient reports that for the last 2 weeks she has been experiencing urinary urgency, feeling of incomplete emptying of her bladder, and increase in urination at night.  This is been associated with some feelings of fatigue as well.  She denies pain with urination, blood in her urine, cloudiness to her urine, fever, nausea, vomiting, incontinence, vaginal discharge, or vaginal itching.  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   Left hip pain 11/01/2017   Headache disorder 08/29/2017   Premature ventricular contractions 05/25/2016   Asthma in adult without complication 17/51/0258   Heart palpitations 04/23/2016   History of kidney cancer 04/23/2016   Bilateral cellulitis of lower leg 10/03/2015   Renal mass, left 02/24/2015   Thrombophlebitis 09/29/2014   Chest pressure 09/12/2014   Dyspnea on exertion 09/12/2014   Generalized weakness 09/09/2014   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 Authorizing Provider  albuterol (PROVENTIL HFA;VENTOLIN HFA) 108 (90 BASE) MCG/ACT inhaler Inhale 2 puffs into the lungs every 6 (six) hours as needed for wheezing or shortness of breath. 12/24/14  Yes [name], MD  albuterol (VENTOLIN HFA) 108 (90 Base) MCG/ACT inhaler Inhale 2 puffs into the lungs every 6 (six) hours as needed for wheezing or shortness of breath. 05/09/19  Yes Merlyn Lot, MD  aspirin EC 81 MG tablet Take 81 mg by mouth daily.   Yes [provider]  Butalbital-APAP-Caffeine 50-300-40 MG CAPS take 1 to 2 capsules by mouth every 6 hours if needed for headache 07/30/17  Yes [provider]  carvedilol (COREG) 6.25 MG tablet Take 6.25 mg by mouth 2 (two) times daily. 03/25/19  Yes [provider]  Elastic Bandages & Supports (EMS ANTI-EMBOLISM STOCKINGS) MISC Apply in the morning and remove at night. 06/23/14  Yes [provider]  lisinopril (PRINIVIL,ZESTRIL) 40 MG tablet Take 1 tablet by mouth daily. 08/27/14  Yes [provider]  metroNIDAZOLE (FLAGYL) 500 MG tablet Take 1 tablet (500 mg total)  by mouth 2 (two) times daily. 01/10/21  Yes Margarette Canada, NP  potassium chloride SA (K-DUR,KLOR-CON) 20 MEQ tablet Take 20 mEq by mouth daily.    Yes [provider]  traMADol (ULTRAM) 50 MG tablet Take 1-2 tablets (50-100 mg total) by mouth every 6 (six) hours as needed. 07/13/20  Yes Kris Hartmann, NP  linaclotide Rolan Lipa) 145 MCG CAPS capsule Take 1 capsule (145 mcg total) by mouth daily before breakfast. 09/30/19 05/30/20  Lin Landsman, MD    Family History Family History  Problem Relation Age of Onset   Colon cancer Mother    Hypertension Mother    Heart attack Father 13   Hypertension Father    Hyperlipidemia Father    Diabetes Father    Stroke Neg Hx    Breast cancer Neg Hx      Social History Social History   Tobacco Use   Smoking status: Never   Smokeless tobacco: Never  Vaping Use   Vaping Use: Never used  Substance Use Topics   Alcohol use: No   Drug use: No     Allergies   Amlodipine, Shellfish allergy, Thiazide-type diuretics, and Penicillins   Review of Systems Review of Systems  Constitutional:  Positive for fatigue. Negative for activity change, appetite change and fever.  Gastrointestinal:  Negative for nausea and vomiting.  Genitourinary:  Positive for frequency and urgency. Negative for dysuria, hematuria, vaginal discharge and vaginal pain.       Increase in voiding at night.  Musculoskeletal:  Positive for back pain.  Skin:  Negative for rash.  Hematological: Negative.   Psychiatric/Behavioral: Negative.      Physical Exam Triage Vital Signs ED Triage Vitals  Enc Vitals Group     BP 01/10/21 1830 (!) 141/76     Pulse Rate 01/10/21 1830 72     Resp 01/10/21 1830 18     Temp 01/10/21 1830 98.5 F (36.9 C)     Temp Source 01/10/21 1830 Oral     SpO2 01/10/21 1830 99 %     Weight 01/10/21 1827 212 lb (96.2 kg)     Height 01/10/21 1827 5\' 9"  (1.753 m)     Head Circumference --      Peak Flow --      Pain Score 01/10/21 1827 0     Pain Loc --      Pain Edu? --      Excl. in [place]? --    No data found.  Updated Vital Signs BP (!) 141/76 (BP Location: Left Arm)    Pulse 72    Temp 98.5 F (36.9 C) (Oral)    Resp 18    Ht 5\' 9"  (1.753 m)    Wt 212 lb (96.2 kg)    SpO2 99%    BMI 31.31 kg/m   Visual Acuity Right Eye Distance:   Left Eye Distance:   Bilateral Distance:    Right Eye Near:   Left Eye Near:    Bilateral Near:     Physical Exam Vitals and nursing note reviewed.  Constitutional:      General: She is not in acute distress.    Appearance: Normal appearance. She is not ill-appearing.  HENT:     Head: Normocephalic and atraumatic.  Cardiovascular:     Rate and Rhythm: Normal rate and regular rhythm.      Pulses: Normal pulses.     Heart sounds: Normal heart sounds. No murmur heard.   No  gallop.  Pulmonary:     Effort: Pulmonary effort is normal.     Breath sounds: Normal breath sounds. No wheezing, rhonchi or rales.  Abdominal:     Tenderness: There is no right CVA tenderness or left CVA tenderness.  Skin:    General: Skin is warm and dry.     Capillary Refill: Capillary refill takes less than 2 seconds.     Findings: No erythema or rash.  Neurological:     General: No focal deficit present.     Mental Status: She is alert and oriented to person, place, and time.  Psychiatric:        Mood and Affect: Mood normal.        Behavior: Behavior normal.        Thought Content: Thought content normal.        Judgment: Judgment normal.     UC Treatments / Results  Labs (all labs ordered are listed, but only abnormal results are displayed) Labs Reviewed  WET PREP, GENITAL - Abnormal; Notable for the following components:      Result Value   Clue Cells Wet Prep HPF POC PRESENT (*)    WBC, Wet Prep HPF POC NONE SEEN (*)    All other components within normal limits  URINALYSIS, COMPLETE (UACMP) WITH MICROSCOPIC - Abnormal; Notable for the following components:   Bacteria, UA MANY (*)    All other components within normal limits  BASIC METABOLIC PANEL - Abnormal; Notable for the following components:   BUN 24 (*)    All other components within normal limits  CBC WITH DIFFERENTIAL/PLATELET    EKG Sinus rhythm with first-degree block with a ventricular rate of 66 bpm. QRS duration 82 ms QT/QTc 350/366 ms Patient has peaked T waves in V2 through V6.   Radiology No results found.  Procedures Procedures (including critical care time)  Medications Ordered in UC Medications - No data to display  Initial Impression / Assessment and Plan / UC Course  I have reviewed the triage vital signs and the nursing notes.  Pertinent labs & imaging results that were available during my care  of the patient were reviewed by me and considered in my medical decision making (see chart for details).  Very pleasant, nontoxic-appearing 70 year old female here for evaluation of urinary complaints that have been going on for the past 2 weeks and include increased urinary urgency, feeling of incomplete voiding, and increase in nocturia.  She has also had low back pain.  She denies any pain with urination, blood in her urine, cloudiness in her urine, fever, nausea, vomiting, or incontinence.  Also denies any vaginal complaints.  She reports that she has also had an increased feeling of fatigue in the past 2 weeks.  When asked about chest pain she said that she had 1 episode of chest pain that lasted a short duration and was fleetingly a week or so ago and she does occasionally become short of breath.  Urinalysis was collected at triage.  Will check EKG to look for any cardiac anomalies.  If urinalysis does not show any signs of infection will check CBC and wet prep.  Urinalysis shows many bacteria but is negative for leukocyte esterase, nitrites, or protein.  0-5 WBCs, 6-10 squamous epithelials.  No evidence of urinary tract infection.  Will order wet prep and also check CBC.  EKG shows sinus rhythm with a first-degree AV block and her PR interval is a little long at 210 ms.  This was compared to prior EKG from 05/11/2019 where the first-degree AV block is not present.  Also the T waves are not peaked.  Will check BMP to evaluate for elevated potassium. As patient has been on long ter potassium supplementation  Wet prep is positive for clue cells.  CBC is unremarkable and does not show any signs of anemia.  Patient takes potassium supplements nightly and she has not had her potassium checked in "a while".  She also reports that she has had several episodes today where she is felt her heart beating very strongly in her chest and felt like she was going to pass out but she did not have any syncopal  episodes.  BMP shows potassium is 3.6.  I will have patient continue her potassium supplementation and I will make a referral to cardiology for evaluation of her new onset first-degree AV block.  I will treat her BV with metronidazole twice daily for 7 days.     Final Clinical Impressions(s) / UC Diagnoses   Final diagnoses:  BV (bacterial vaginosis)  First degree AV block     Discharge Instructions      Take the Flagyl (metronidazole) 500 mg twice daily for treatment of your bacterial vaginosis.  Avoid alcohol while on the metronidazole as taken together will cause of vomiting.  Bacterial vaginosis is often caused by a imbalance of bacteria in your vaginal vault.  This is sometimes a result of using tampons or hormonal fluctuations during her menstrual cycle.  You if your symptoms are recurrent you can try using a boric acid suppository twice weekly to help maintain the acid-base balance in your vagina vault which could prevent further infection.  You can also try vaginal probiotics to help return normal bacterial balance.   Your EKG showed a abnormal heart rhythm, as we discussed, called a first-degree AV block.  This is new when compared to your previous EKGs.  I am referring you to cardiology in [facility] for evaluation.  If you develop any chest pain, shortness of breath, or dizziness please return for reevaluation or go to the emergency department.     ED Prescriptions     Medication Sig Dispense Auth. Provider   metroNIDAZOLE (FLAGYL) 500 MG tablet Take 1 tablet (500 mg total) by mouth 2 (two) times daily. 14 tablet Margarette Canada, NP      PDMP not reviewed this encounter.   Margarette Canada, NP 01/10/21 2016    Margarette Canada, NP 01/12/21 [phone]

## 2021-01-13 ENCOUNTER — Encounter: Payer: Medicare Other | Admitting: Internal Medicine

## 2021-01-17 ENCOUNTER — Ambulatory Visit (INDEPENDENT_AMBULATORY_CARE_PROVIDER_SITE_OTHER): Payer: Medicare Other | Admitting: Cardiovascular Disease

## 2021-01-17 ENCOUNTER — Other Ambulatory Visit: Payer: Self-pay

## 2021-01-17 ENCOUNTER — Encounter: Payer: Self-pay | Admitting: Cardiovascular Disease

## 2021-01-17 VITALS — BP 130/68 | HR 76 | Ht 69.0 in | Wt 201.5 lb

## 2021-01-17 DIAGNOSIS — R072 Precordial pain: Secondary | ICD-10-CM

## 2021-01-17 DIAGNOSIS — R0602 Shortness of breath: Secondary | ICD-10-CM

## 2021-01-17 DIAGNOSIS — I10 Essential (primary) hypertension: Secondary | ICD-10-CM

## 2021-01-17 MED ORDER — METOPROLOL TARTRATE 100 MG PO TABS: ORAL_TABLET | ORAL | 0 refills | Status: DC

## 2021-01-17 NOTE — Patient Instructions (Signed)
Medication Instructions:  Your physician recommends that you continue on your current medications as directed. Please refer to the Current Medication list given to you today.  A one time dose of Metoprolol 100 mg tablet to be taken 2 hours prior to your Cardiac CTA.  DO NOT take Carvedilol on the morning of your Cardiac CTA   *If you need a refill on your cardiac medications before your next appointment, please call your pharmacy*   Lab Work: None ordered If you have labs (blood work) drawn today and your tests are completely normal, you will receive your results only by: [place] (if you have MyChart) OR A paper copy in the mail If you have any lab test that is abnormal or we need to change your treatment, we will call you to review the results.   Testing/Procedures: Your physician has requested that you have an echocardiogram. Echocardiography is a painless test that uses sound waves to create images of your heart. It provides your doctor with information about the size and shape of your heart and how well your hearts chambers and valves are working. This procedure takes approximately one hour. There are no restrictions for this procedure.  Your physician has requested that you have cardiac CT. Cardiac computed tomography (CT) is a painless test that uses an x-ray machine to take clear, detailed pictures of your heart. For further information please visit [email or web address]. Please follow instruction sheet as given.     Follow-Up: At [facility], you and your health needs are our priority.  As part of our continuing mission to provide you with exceptional heart care, we have created designated Provider Care Teams.  These Care Teams include your primary Cardiologist (physician) and Advanced Practice Providers (APPs -  Physician Assistants and Nurse Practitioners) who all work together to provide you with the care you need, when you need it.  We recommend signing up for the  patient portal called "MyChart".  Sign up information is provided on this After Visit Summary.  MyChart is used to connect with patients for Virtual Visits (Telemedicine).  Patients are able to view lab/test results, encounter notes, upcoming appointments, etc.  Non-urgent messages can be sent to your provider as well.   To learn more about what you can do with MyChart, go to [email or web address].    Your next appointment:   4 week(s)  The format for your next appointment:   In Person  Provider:   You may see Kathlyn Sacramento, MD or one of the following Advanced Practice Providers on your designated Care Team:   Murray Hodgkins, NP Christell Faith, PA-C Cadence Kathlen Mody, PA-C{    Other Instructions    Your cardiac CT will be scheduled at one of the below locations:   [facility] [address] [place], [place] [address] [phone]  [facility] [address] [place], [place] [address] [phone]  If scheduled at [facility], please arrive at the [facility] main entrance (entrance A) of [facility] 30 minutes prior to test start time. You can use the FREE valet parking offered at the main entrance (encouraged to control the heart rate for the test) Proceed to the [facility] Radiology Department (first floor) to check-in and test prep.  If scheduled at [facility], please arrive 15 mins early for check-in and test prep.  Please follow these instructions carefully (unless otherwise directed):   On the Night Before the  Test: Be sure to Drink plenty of water. Do not consume any caffeinated/decaffeinated beverages or chocolate 12 hours prior to your test. Do not take any antihistamines 12 hours prior to your test.  On the Day of the Test: Drink plenty of water until 1 hour prior to the test. Do not eat any food 4 hours prior to the test. You may take your regular  medications prior to the test.  Take metoprolol (Lopressor) two hours prior to test. FEMALES- please wear underwire-free bra if available, avoid dresses & tight clothing        After the Test: Drink plenty of water. After receiving IV contrast, you may experience a mild flushed feeling. This is normal. On occasion, you may experience a mild rash up to 24 hours after the test. This is not dangerous. If this occurs, you can take Benadryl 25 mg and increase your fluid intake. If you experience trouble breathing, this can be serious. If it is severe call 911 IMMEDIATELY. If it is mild, please call our office. If you take any of these medications: Glipizide/Metformin, Avandament, Glucavance, please do not take 48 hours after completing test unless otherwise instructed.  Please allow 2-4 weeks for scheduling of routine cardiac CTs. Some insurance companies require a pre-authorization which may delay scheduling of this test.   For non-scheduling related questions, please contact the cardiac imaging nurse navigator should you have any questions/concerns: Marchia Bond, Cardiac Imaging Nurse Navigator Gordy Clement, Cardiac Imaging Nurse Navigator [facility] Direct Office Dial: [phone]   For scheduling needs, including cancellations and rescheduling, please call [place], [phone].

## 2021-01-17 NOTE — Progress Notes (Signed)
Cardiology Office Note   Date:  01/17/2021   ID:  Janet [name], DOB 08-03-1950, MRN [id number]  PCP:  Marguerita Merles, MD  Cardiologist:   Kathlyn Sacramento, MD   Chief Complaint  Patient presents with   Other    Pt recently seen Urgent care c/o c/o chest discomfort, sob and fatigue. Meds reviewed verbally with pt.      History of Present Illness: Janet [name] is a 71 y.o. female who was referred from urgent care for evaluation of chest pain and shortness of breath. She has known history of palpitations, essential hypertension, recurrent DVTs and chronic venous insufficiency. She was seen in 2018 by Dr. Saralyn Pilar for palpitations and lower extremity edema.  She underwent a dobutamine echocardiogram which showed normal LV systolic function with no ischemia.  24-hour Holter monitor showed occasional PVCs.  Echocardiogram showed an EF of 55 to 60% with mild mitral and tricuspid regurgitation. She reports recent symptoms of substernal chest pain and tightness that happens with walking and last for 1 to 2 minutes.  It is associated with shortness of breath and dizziness.  She has no symptoms at rest.  No recent upper respiratory tract infections.  No recent cardiac work-up.   Past Medical History:  Diagnosis Date   Asthma    Cancer ([place])    kidney   Clotting disorder ([place])    Hypertension    Thyroid disease     Past Surgical History:  Procedure Laterality Date   ABDOMINAL HYSTERECTOMY     COLONOSCOPY WITH PROPOFOL N/A 05/25/2019   Procedure: COLONOSCOPY WITH PROPOFOL;  Surgeon: Lin Landsman, MD;  Location: [facility] ENDOSCOPY;  Service: Gastroenterology;  Laterality: N/A;     Current Outpatient Medications  Medication Sig Dispense Refill   albuterol (PROVENTIL HFA;VENTOLIN HFA) 108 (90 BASE) MCG/ACT inhaler Inhale 2 puffs into the lungs every 6 (six) hours as needed for wheezing or shortness of breath. 1 Inhaler 0   aspirin EC 81 MG tablet Take 81 mg by mouth daily.      Butalbital-APAP-Caffeine 50-300-40 MG CAPS take 1 to 2 capsules by mouth every 6 hours if needed for headache     carvedilol (COREG) 6.25 MG tablet Take 6.25 mg by mouth 2 (two) times daily.     Elastic Bandages & Supports (EMS ANTI-EMBOLISM STOCKINGS) MISC Apply in the morning and remove at night.     lisinopril (PRINIVIL,ZESTRIL) 40 MG tablet Take 1 tablet by mouth daily.  0   metoprolol tartrate (LOPRESSOR) 100 MG tablet Take 1 tablet (100 mg) 2 hours prior to Cardiac CTA 1 tablet 0   metroNIDAZOLE (FLAGYL) 500 MG tablet Take 1 tablet (500 mg total) by mouth 2 (two) times daily. 14 tablet 0   potassium chloride SA (K-DUR,KLOR-CON) 20 MEQ tablet Take 20 mEq by mouth daily.      traMADol (ULTRAM) 50 MG tablet Take 1-2 tablets (50-100 mg total) by mouth every 6 (six) hours as needed. 30 tablet 0   No current facility-administered medications for this visit.    Allergies:   Amlodipine, Shellfish allergy, Thiazide-type diuretics, and Penicillins    Social History:  The patient  reports that she has never smoked. She has never used smokeless tobacco. She reports that she does not drink alcohol and does not use drugs.   Family History:  The patient's family history includes Colon cancer in her mother; Diabetes in her father; Heart Problems in her son; Heart attack in her brother, brother,  and daughter; Heart attack (age of onset: 31) in her father; Hyperlipidemia in her father; Hypertension in her father and mother.    ROS:  Please see the history of present illness.   Otherwise, review of systems are positive for none.   All other systems are reviewed and negative.    PHYSICAL EXAM: VS:  BP 130/68 (BP Location: Right Arm, Patient Position: Sitting, Cuff Size: Large)    Pulse 76    Ht 5\' 9"  (1.753 m)    Wt 201 lb 8 oz (91.4 kg)    SpO2 96%    BMI 29.76 kg/m  , BMI Body mass index is 29.76 kg/m. GEN: Well nourished, well developed, in no acute distress  HEENT: normal  Neck: no JVD, carotid  bruits, or masses Cardiac: RRR; no murmurs, rubs, or gallops,no edema  Respiratory:  clear to auscultation bilaterally, normal work of breathing GI: soft, nontender, nondistended, + BS MS: no deformity or atrophy  Skin: warm and dry, no rash Neuro:  Strength and sensation are intact Psych: euthymic mood, full affect   EKG:  EKG is ordered today. The ekg ordered today demonstrates normal sinus rhythm with no significant ST or T wave changes.   Recent Labs: 01/10/2021: BUN 24; Creatinine, Ser 0.97; Hemoglobin 13.0; Platelets 284; Potassium 3.6; Sodium 138    Lipid Panel No results found for: CHOL, TRIG, HDL, CHOLHDL, VLDL, LDLCALC, LDLDIRECT    Wt Readings from Last 3 Encounters:  01/17/21 201 lb 8 oz (91.4 kg)  01/10/21 212 lb (96.2 kg)  08/03/20 192 lb (87.1 kg)       PAD Screen 01/17/2021  Previous PAD dx? No  Previous surgical procedure? No  Pain with walking? No  Feet/toe relief with dangling? No  Painful, non-healing ulcers? Yes  Extremities discolored? Yes      ASSESSMENT AND PLAN:  1.  Exertional chest pain and shortness of breath: Symptoms are worrisome for angina.  EKG with no ischemic symptoms.  I discussed different management options and recommend proceeding with cardiac CTA.  She is allergic to shellfish but not to contrast.  She had CT scans with contrast in the past with no issues. Given exertional dyspnea, I also requested an echocardiogram. If cardiac work-up is unremarkable, consider pulmonary referral.  2.  Essential hypertension: Blood pressure is well controlled on carvedilol and lisinopril.    Disposition:   FU with me as needed  Signed,  Kathlyn Sacramento, MD  01/17/2021 3:50 PM    [place]

## 2021-01-19 ENCOUNTER — Other Ambulatory Visit: Payer: Self-pay

## 2021-01-19 ENCOUNTER — Ambulatory Visit (INDEPENDENT_AMBULATORY_CARE_PROVIDER_SITE_OTHER): Payer: Medicare Other

## 2021-01-19 DIAGNOSIS — R0602 Shortness of breath: Secondary | ICD-10-CM

## 2021-01-20 LAB — ECHOCARDIOGRAM COMPLETE
AR max vel: 2.8 cm2
AV Area VTI: 3 cm2
AV Area mean vel: 2.93 cm2
AV Mean grad: 2 mmHg
AV Peak grad: 4.6 mmHg
Ao pk vel: 1.07 m/s
Area-P 1/2: 5.54 cm2
Calc EF: 57 %
S' Lateral: 3.4 cm
Single Plane A2C EF: 57.7 %
Single Plane A4C EF: 59.1 %

## 2021-01-25 ENCOUNTER — Telehealth (HOSPITAL_COMMUNITY): Payer: Self-pay | Admitting: Emergency Medicine

## 2021-01-25 NOTE — Telephone Encounter (Signed)
VM box full, unable to leave message Marchia Bond RN Navigator Cardiac Imaging [facility] [phone] Office  [phone] Cell

## 2021-01-26 ENCOUNTER — Ambulatory Visit
Admission: RE | Admit: 2021-01-26 | Discharge: 2021-01-26 | Disposition: A | Discharge status: Home / Self Care | Payer: Medicare Other | Source: Ambulatory Visit | Attending: Cardiovascular Disease | Admitting: Cardiovascular Disease

## 2021-01-26 ENCOUNTER — Other Ambulatory Visit: Payer: Self-pay

## 2021-01-26 ENCOUNTER — Ambulatory Visit: Payer: Medicare Other | Admitting: Cardiology

## 2021-01-26 DIAGNOSIS — R072 Precordial pain: Secondary | ICD-10-CM | POA: Insufficient documentation

## 2021-01-26 MED ORDER — NITROGLYCERIN 0.4 MG SL SUBL
0.8000 mg | SUBLINGUAL_TABLET | Freq: Once | SUBLINGUAL | Status: AC
  Administered 2021-01-26: 0.8 mg via SUBLINGUAL

## 2021-01-26 MED ORDER — IOHEXOL 350 MG/ML SOLN
100.0000 mL | Freq: Once | INTRAVENOUS | Status: AC | PRN
  Administered 2021-01-26: 100 mL via INTRAVENOUS

## 2021-01-26 MED ORDER — METOPROLOL TARTRATE 5 MG/5ML IV SOLN
10.0000 mg | Freq: Once | INTRAVENOUS | Status: AC
Start: 2021-01-26 — End: 2021-01-26
  Administered 2021-01-26: 10 mg via INTRAVENOUS

## 2021-01-26 NOTE — Progress Notes (Signed)
Patient tolerated procedure well. Ambulate w/o difficulty. Denies light headedness or being dizzy. Sitting in chair drinking water provided. Encouraged to drink extra water today and reasoning explained. Verbalized understanding. All questions answered. ABC intact. No further needs. Discharge from procedure area w/o issues.   °

## 2021-01-27 ENCOUNTER — Telehealth: Payer: Self-pay

## 2021-01-27 NOTE — Telephone Encounter (Signed)
Patient made aware of Cardiac CTA results with verbalized understanding.

## 2021-01-27 NOTE — Telephone Encounter (Signed)
-----   Message from Wellington Hampshire, MD sent at 01/27/2021  2:45 PM EST ----- Cardiac CTA showed mild plaque in the right coronary artery but no significant obstructive disease.  Overall, good report.

## 2021-02-07 ENCOUNTER — Ambulatory Visit (INDEPENDENT_AMBULATORY_CARE_PROVIDER_SITE_OTHER): Payer: Medicare Other | Admitting: Vascular Surgery

## 2021-02-07 ENCOUNTER — Encounter (INDEPENDENT_AMBULATORY_CARE_PROVIDER_SITE_OTHER): Payer: Self-pay | Admitting: Vascular Surgery

## 2021-02-07 ENCOUNTER — Other Ambulatory Visit: Payer: Self-pay

## 2021-02-07 VITALS — BP 129/77 | HR 75 | Resp 16 | Wt 202.2 lb

## 2021-02-07 DIAGNOSIS — L97929 Non-pressure chronic ulcer of unspecified part of left lower leg with unspecified severity: Secondary | ICD-10-CM

## 2021-02-07 DIAGNOSIS — I10 Essential (primary) hypertension: Secondary | ICD-10-CM

## 2021-02-07 DIAGNOSIS — M7989 Other specified soft tissue disorders: Secondary | ICD-10-CM

## 2021-02-07 DIAGNOSIS — I83029 Varicose veins of left lower extremity with ulcer of unspecified site: Secondary | ICD-10-CM

## 2021-02-08 NOTE — Assessment & Plan Note (Signed)
blood pressure control important in reducing the progression of atherosclerotic disease. On appropriate oral medications.  

## 2021-02-08 NOTE — Progress Notes (Signed)
MRN : [id number]  Janet [name] is a 71 y.o. ([birth year]) female who presents with chief complaint of  Chief Complaint  Patient presents with   Follow-up    6 month follow up  .  History of Present Illness: Patient returns today in follow up of lower extremity swelling and ulceration.  She went to the wound care center where they were able to get her venous ulceration healed and this has been healed now for a few months.  Her swelling is mild at this point.  She is not having a lot of pain.  No erythema or drainage.  She is in good spirits today and pleased with the results  Current Outpatient Medications  Medication Sig Dispense Refill   albuterol (PROVENTIL HFA;VENTOLIN HFA) 108 (90 BASE) MCG/ACT inhaler Inhale 2 puffs into the lungs every 6 (six) hours as needed for wheezing or shortness of breath. 1 Inhaler 0   aspirin EC 81 MG tablet Take 81 mg by mouth daily.     Butalbital-APAP-Caffeine 50-300-40 MG CAPS take 1 to 2 capsules by mouth every 6 hours if needed for headache     carvedilol (COREG) 6.25 MG tablet Take 6.25 mg by mouth 2 (two) times daily.     Elastic Bandages & Supports (EMS ANTI-EMBOLISM STOCKINGS) MISC Apply in the morning and remove at night.     lisinopril (PRINIVIL,ZESTRIL) 40 MG tablet Take 1 tablet by mouth daily.  0   metoprolol tartrate (LOPRESSOR) 100 MG tablet Take 1 tablet (100 mg) 2 hours prior to Cardiac CTA 1 tablet 0   potassium chloride SA (K-DUR,KLOR-CON) 20 MEQ tablet Take 20 mEq by mouth daily.      traMADol (ULTRAM) 50 MG tablet Take 1-2 tablets (50-100 mg total) by mouth every 6 (six) hours as needed. 30 tablet 0   metroNIDAZOLE (FLAGYL) 500 MG tablet Take 1 tablet (500 mg total) by mouth 2 (two) times daily. (Patient not taking: Reported on 01/26/2021) 14 tablet 0   No current facility-administered medications for this visit.    Past Medical History:  Diagnosis Date   Asthma    Cancer ([place])    kidney   Clotting disorder ([place])    Hypertension     Thyroid disease     Past Surgical History:  Procedure Laterality Date   ABDOMINAL HYSTERECTOMY     COLONOSCOPY WITH PROPOFOL N/A 05/25/2019   Procedure: COLONOSCOPY WITH PROPOFOL;  Surgeon: Lin Landsman, MD;  Location: [facility] ENDOSCOPY;  Service: Gastroenterology;  Laterality: N/A;     Social History   Tobacco Use   Smoking status: Never   Smokeless tobacco: Never  Vaping Use   Vaping Use: Never used  Substance Use Topics   Alcohol use: No   Drug use: No      Family History  Problem Relation Age of Onset   Colon cancer Mother    Hypertension Mother    Heart attack Father 43   Hypertension Father    Hyperlipidemia Father    Diabetes Father    Heart attack Brother    Heart attack Brother    Heart attack Daughter    Heart Problems Son    Stroke Neg Hx    Breast cancer Neg Hx      Allergies  Allergen Reactions   Amlodipine Swelling    Other reaction(s): Angioedema   Shellfish Allergy Anaphylaxis   Thiazide-Type Diuretics Other (See Comments)    Other reaction(s): Other (See Comments)   Penicillins Hives  and Other (See Comments)    Has patient had a PCN reaction causing immediate rash, facial/tongue/throat swelling, SOB or lightheadedness with hypotension: no Has patient had a PCN reaction causing severe rash involving mucus membranes or skin necrosis: no Has patient had a PCN reaction that required hospitalization? no Has patient had a PCN reaction occurring within the last 10 years: no If all of the above answers are "NO", then may proceed with Cephalosporin use.      REVIEW OF SYSTEMS (Negative unless checked)  Constitutional: [] Weight loss  [] Fever  [] Chills Cardiac: [] Chest pain   [] Chest pressure   [] Palpitations   [] Shortness of breath when laying flat   [] Shortness of breath at rest   [] Shortness of breath with exertion. Vascular:  [] Pain in legs with walking   [x] Pain in legs at rest   [] Pain in legs when laying flat   [] Claudication   [] Pain  in feet when walking  [] Pain in feet at rest  [] Pain in feet when laying flat   [] History of DVT   [] Phlebitis   [x] Swelling in legs   [] Varicose veins   [] Non-healing ulcers Pulmonary:   [] Uses home oxygen   [] Productive cough   [] Hemoptysis   [] Wheeze  [] COPD   [] Asthma Neurologic:  [] Dizziness  [] Blackouts   [] Seizures   [] History of stroke   [] History of TIA  [] Aphasia   [] Temporary blindness   [] Dysphagia   [] Weakness or numbness in arms   [] Weakness or numbness in legs Musculoskeletal:  [x] Arthritis   [] Joint swelling   [x] Joint pain   [] Low back pain Hematologic:  [] Easy bruising  [] Easy bleeding   [] Hypercoagulable state   [] Anemic   Gastrointestinal:  [] Blood in stool   [] Vomiting blood  [] Gastroesophageal reflux/heartburn   [] Abdominal pain Genitourinary:  [] Chronic kidney disease   [] Difficult urination  [] Frequent urination  [] Burning with urination   [] Hematuria Skin:  [] Rashes   [] Ulcers   [] Wounds Psychological:  [] History of anxiety   []  History of major depression.  Physical Examination  BP 129/77 (BP Location: Left Arm)    Pulse 75    Resp 16    Wt 202 lb 3.2 oz (91.7 kg)    BMI 29.86 kg/m  Gen:  WD/WN, NAD Head: [place]/AT, No temporalis wasting. Ear/Nose/Throat: Hearing grossly intact, nares w/o erythema or drainage Eyes: Conjunctiva clear. Sclera non-icteric Neck: Supple.  Trachea midline Pulmonary:  Good air movement, no use of accessory muscles.  Cardiac: RRR, no JVD Vascular:  Vessel Right Left  Radial Palpable Palpable                          PT Palpable Palpable  DP Palpable Palpable   Gastrointestinal: soft, non-tender/non-distended. No guarding/reflex.  Musculoskeletal: M/S 5/5 throughout.  No deformity or atrophy. Trace LE edema. Neurologic: Sensation grossly intact in extremities.  Symmetrical.  Speech is fluent.  Psychiatric: Judgment intact, Mood & affect appropriate for pt's clinical situation. Dermatologic: No rashes or ulcers noted.  No cellulitis  or open wounds.      Labs Recent Results (from the past 2160 hour(s))  Urinalysis, Complete w Microscopic Urine, Clean Catch     Status: Abnormal   Collection Time: 01/10/21  6:32 PM  Result Value Ref Range   Color, Urine YELLOW YELLOW   APPearance CLEAR CLEAR   Specific Gravity, Urine 1.025 1.005 - 1.030   pH 5.5 5.0 - 8.0   Glucose, UA NEGATIVE NEGATIVE mg/dL   Hgb  urine dipstick NEGATIVE NEGATIVE   Bilirubin Urine NEGATIVE NEGATIVE   Ketones, ur NEGATIVE NEGATIVE mg/dL   Protein, ur NEGATIVE NEGATIVE mg/dL   Nitrite NEGATIVE NEGATIVE   Leukocytes,Ua NEGATIVE NEGATIVE   Squamous Epithelial / LPF 6-10 0 - 5   WBC, UA 0-5 0 - 5 WBC/hpf   RBC / HPF 0-5 0 - 5 RBC/hpf   Bacteria, UA MANY (A) NONE SEEN    Comment: Performed at [facility], [address], [place], [place] [address]  Wet prep, genital     Status: Abnormal   Collection Time: 01/10/21  7:01 PM   Specimen: Vaginal  Result Value Ref Range   Yeast Wet Prep HPF POC NONE SEEN NONE SEEN   Trich, Wet Prep NONE SEEN NONE SEEN   Clue Cells Wet Prep HPF POC PRESENT (A) NONE SEEN   WBC, Wet Prep HPF POC NONE SEEN (A) <10   Sperm NONE SEEN     Comment: Performed at [facility], [address], [place], [place] [address]  CBC with Differential     Status: None   Collection Time: 01/10/21  7:01 PM  Result Value Ref Range   WBC 7.5 4.0 - 10.5 K/uL   RBC 4.47 3.87 - 5.11 MIL/uL   Hemoglobin 13.0 12.0 - 15.0 g/dL   HCT 41.9 36.0 - 46.0 %   MCV 93.7 80.0 - 100.0 fL   MCH 29.1 26.0 - 34.0 pg   MCHC 31.0 30.0 - 36.0 g/dL   RDW 12.7 11.5 - 15.5 %   Platelets 284 150 - 400 K/uL   nRBC 0.0 0.0 - 0.2 %   Neutrophils Relative % 56 %   Neutro Abs 4.2 1.7 - 7.7 K/uL   Lymphocytes Relative 35 %   Lymphs Abs 2.6 0.7 - 4.0 K/uL   Monocytes Relative 6 %   Monocytes Absolute 0.4 0.1 - 1.0 K/uL   Eosinophils Relative 3 %   Eosinophils Absolute 0.2 0.0 - 0.5 K/uL   Basophils Relative 0 %   Basophils  Absolute 0.0 0.0 - 0.1 K/uL   Immature Granulocytes 0 %   Abs Immature Granulocytes 0.02 0.00 - 0.07 K/uL    Comment: Performed at [facility], [address] [place], [place] [address]  Basic metabolic panel     Status: Abnormal   Collection Time: 01/10/21  7:35 PM  Result Value Ref Range   Sodium 138 135 - 145 mmol/L   Potassium 3.6 3.5 - 5.1 mmol/L   Chloride 107 98 - 111 mmol/L   CO2 26 22 - 32 mmol/L   Glucose, Bld 87 70 - 99 mg/dL    Comment: Glucose reference range applies only to samples taken after fasting for at least 8 hours.   BUN 24 (H) 8 - 23 mg/dL   Creatinine, Ser 0.97 0.44 - 1.00 mg/dL   Calcium 9.4 8.9 - 10.3 mg/dL   GFR, Estimated >60 >60 mL/min    Comment: (NOTE) Calculated using the CKD-EPI Creatinine Equation (2021)    Anion gap 5 5 - 15    Comment: Performed at [facility], [address], [place], [place] [address]  ECHOCARDIOGRAM COMPLETE     Status: None   Collection Time: 01/19/21  8:59 AM  Result Value Ref Range   AR max vel 2.80 cm2   AV Peak grad 4.6 mmHg   Ao pk vel 1.07 m/s   S' Lateral 3.40 cm   Area-P  1/2 5.54 cm2   AV Area VTI 3.00 cm2   AV Mean grad 2.0 mmHg   Single Plane A4C EF 59.1 %   Single Plane A2C EF 57.7 %   Calc EF 57.0 %   AV Area mean vel 2.93 cm2    Radiology CT CORONARY MORPH W/CTA COR W/SCORE W/CA W/CM &/OR WO/CM  Addendum Date: 01/27/2021   ADDENDUM REPORT: 01/27/2021 10:46 EXAM: OVER-READ INTERPRETATION  CT CHEST The following report is an over-read performed by radiologist Dr. Alvino Blood [facility] on 01/27/2021. This over-read does not include interpretation of cardiac or coronary anatomy or pathology. The CTA interpretation by the cardiologist is attached. COMPARISON:  None. FINDINGS: Limited view of the lung parenchyma demonstrates no suspicious nodularity. Airways are normal. Limited view of the mediastinum demonstrates no adenopathy. Esophagus normal. Limited view of  the upper abdomen unremarkable. Limited view of the skeleton and chest wall is unremarkable. IMPRESSION: No significant extracardiac findings. Electronically Signed   By: Suzy Bouchard M.D.   On: 01/27/2021 10:46   Result Date: 01/27/2021 CLINICAL DATA:  Chest pain EXAM: Cardiac/Coronary  CTA TECHNIQUE: The patient was scanned on a Siemens Somatom go.Top scanner. : A retrospective scan was triggered in the descending thoracic aorta. Axial non-contrast 3 mm slices were carried out through the heart. The data set was analyzed on a dedicated work station and scored using the [place]. Gantry rotation speed was 330 msecs and collimation was .6 mm. 100mg  of metoprolol and 0.8 mg of sl NTG was given. The 3D data set was reconstructed in 5% intervals of the 60-95 % of the R-R cycle. Diastolic phases were analyzed on a dedicated work station using MPR, MIP and VRT modes. The patient received 100 cc of contrast. FINDINGS: Aorta: Normal size. Minimal aortic root calcifications. No dissection. Aortic Valve:  Trileaflet.  No calcifications. Coronary Arteries:  Normal coronary origin.  Right dominance. RCA is a dominant artery that gives rise to PDA and PLA. There is minimal calcified plaque proximally causing minimal stenosis (<25%). Left main is a large artery that gives rise to LAD and LCX arteries. LAD is a large vessel that has no plaque. LCX is a non-dominant artery that gives rise to two obtuse marginal branches. There is no plaque. Other findings: Normal pulmonary vein drainage into the left atrium. Normal left atrial appendage without a thrombus. Normal size of the pulmonary artery. IMPRESSION: 1. Coronary calcium score of 3.99. This was 52nd percentile for age and sex matched control. 2. Normal coronary origin with right dominance. 3. Minimal proximal RCA calcification. 4. CAD-RADS 1. Minimal non-obstructive CAD (0-24%). Consider non-atherosclerotic causes of chest pain. Consider preventive therapy and risk  factor modification. 5. Image quality degraded my motion artifacts. Electronically Signed: By: Kate Sable M.D. On: 01/26/2021 16:35   ECHOCARDIOGRAM COMPLETE  Result Date: 01/20/2021    ECHOCARDIOGRAM REPORT   Patient Name:   JANEL BEANE Date of Exam: 01/19/2021 Medical Rec #:  [id number]    Height:       69.0 in Accession #:    [id number]   Weight:       201.5 lb Date of Birth:  23-May-1950    BSA:          2.073 m Patient Age:    33 years     BP:           132/70 mmHg Patient Gender: F            HR:  76 bpm. Exam Location:  [place] Procedure: 2D Echo, Cardiac Doppler, Color Doppler and Strain Analysis Indications:    R06.02 SOB; R07.9* Chest pain, unspecified  History:        Patient has no prior history of Echocardiogram examinations.                 Signs/Symptoms:Chest Pain and Shortness of Breath; Risk                 Factors:Hypertension and Non-Smoker.  Sonographer:    Pilar Jarvis RDMS, RVT, RDCS Referring Phys: [place]  1. Left ventricular ejection fraction, by estimation, is 60 to 65%. The left ventricle has normal function. The left ventricle has no regional wall motion abnormalities. Left ventricular diastolic parameters are consistent with Grade II diastolic dysfunction (pseudonormalization). The average left ventricular global longitudinal strain is -17.7 %. The global longitudinal strain is normal.  2. Right ventricular systolic function is normal. The right ventricular size is normal. There is normal pulmonary artery systolic pressure. The estimated right ventricular systolic pressure is 10.2 mmHg.  3. The mitral valve is normal in structure. Mild mitral valve regurgitation. No evidence of mitral stenosis.  4. Tricuspid valve regurgitation is moderate.  5. The aortic valve is normal in structure. Aortic valve regurgitation is not visualized. Aortic valve sclerosis is present, with no evidence of aortic valve stenosis.  6. The inferior vena cava is normal  in size with greater than 50% respiratory variability, suggesting right atrial pressure of 3 mmHg. FINDINGS  Left Ventricle: Left ventricular ejection fraction, by estimation, is 60 to 65%. The left ventricle has normal function. The left ventricle has no regional wall motion abnormalities. The average left ventricular global longitudinal strain is -17.7 %. The global longitudinal strain is normal. The left ventricular internal cavity size was normal in size. There is no left ventricular hypertrophy. Left ventricular diastolic parameters are consistent with Grade II diastolic dysfunction (pseudonormalization). Right Ventricle: The right ventricular size is normal. No increase in right ventricular wall thickness. Right ventricular systolic function is normal. There is normal pulmonary artery systolic pressure. The tricuspid regurgitant velocity is 2.71 m/s, and  with an assumed right atrial pressure of 5 mmHg, the estimated right ventricular systolic pressure is 72.5 mmHg. Left Atrium: Left atrial size was normal in size. Right Atrium: Right atrial size was normal in size. Pericardium: There is no evidence of pericardial effusion. Mitral Valve: The mitral valve is normal in structure. Mild mitral valve regurgitation. No evidence of mitral valve stenosis. Tricuspid Valve: The tricuspid valve is normal in structure. Tricuspid valve regurgitation is moderate . No evidence of tricuspid stenosis. Aortic Valve: The aortic valve is normal in structure. Aortic valve regurgitation is not visualized. Aortic valve sclerosis is present, with no evidence of aortic valve stenosis. Aortic valve mean gradient measures 2.0 mmHg. Aortic valve peak gradient measures 4.6 mmHg. Aortic valve area, by VTI measures 3.00 cm. Pulmonic Valve: The pulmonic valve was normal in structure. Pulmonic valve regurgitation is mild. No evidence of pulmonic stenosis. Aorta: The aortic root is normal in size and structure. Venous: The inferior vena cava  is normal in size with greater than 50% respiratory variability, suggesting right atrial pressure of 3 mmHg. IAS/Shunts: No atrial level shunt detected by color flow Doppler.  LEFT VENTRICLE PLAX 2D LVIDd:         4.90 cm     Diastology LVIDs:         3.40 cm  LV e' medial:    6.53 cm/s LV PW:         0.80 cm     LV E/e' medial:  13.4 LV IVS:        0.80 cm     LV e' lateral:   6.09 cm/s LVOT diam:     2.00 cm     LV E/e' lateral: 14.4 LV SV:         65 LV SV Index:   31          2D Longitudinal Strain LVOT Area:     3.14 cm    2D Strain GLS Avg:     -17.7 %  LV Volumes (MOD) LV vol d, MOD A2C: 83.3 ml LV vol d, MOD A4C: 87.0 ml LV vol s, MOD A2C: 35.2 ml LV vol s, MOD A4C: 35.6 ml LV SV MOD A2C:     48.1 ml LV SV MOD A4C:     87.0 ml LV SV MOD BP:      49.1 ml RIGHT VENTRICLE             IVC RV Basal diam:  3.50 cm     IVC diam: 1.30 cm RV S prime:     11.10 cm/s TAPSE (M-mode): 3.3 cm LEFT ATRIUM             Index        RIGHT ATRIUM           Index LA diam:        3.60 cm 1.74 cm/m   RA Area:     12.80 cm LA Vol (A2C):   44.7 ml 21.57 ml/m  RA Volume:   29.30 ml  14.14 ml/m LA Vol (A4C):   38.3 ml 18.48 ml/m LA Biplane Vol: 42.4 ml 20.46 ml/m  AORTIC VALVE                    PULMONIC VALVE AV Area (Vmax):    2.80 cm     PV Vmax:       1.14 m/s AV Area (Vmean):   2.93 cm     PV Peak grad:  5.2 mmHg AV Area (VTI):     3.00 cm AV Vmax:           107.00 cm/s AV Vmean:          64.900 cm/s AV VTI:            0.216 m AV Peak Grad:      4.6 mmHg AV Mean Grad:      2.0 mmHg LVOT Vmax:         95.30 cm/s LVOT Vmean:        60.500 cm/s LVOT VTI:          0.206 m LVOT/AV VTI ratio: 0.95  AORTA Ao Root diam: 3.00 cm Ao Asc diam:  3.20 cm Ao Arch diam: 2.9 cm MITRAL VALVE               TRICUSPID VALVE MV Area (PHT): 5.54 cm    TR Peak grad:   29.4 mmHg MV Decel Time: 137 msec    TR Vmax:        271.00 cm/s MV E velocity: 87.80 cm/s MV A velocity: 72.40 cm/s  SHUNTS MV E/A ratio:  1.21        Systemic VTI:  0.21  m  Systemic Diam: 2.00 cm Ida Rogue MD Electronically signed by Ida Rogue MD Signature Date/Time: 01/20/2021/8:38:34 AM    Final     Assessment/Plan  Varicose veins with ulcer, left ([place]) Now healed with the help of the wound care center.  Essential hypertension blood pressure control important in reducing the progression of atherosclerotic disease. On appropriate oral medications.   Swelling of limb Under good control.  Can return as needed.    Leotis Pain, MD  02/08/2021 10:10 AM    This note was created with Dragon medical transcription system.  Any errors from dictation are purely unintentional

## 2021-02-08 NOTE — Assessment & Plan Note (Signed)
Now healed with the help of the wound care center.

## 2021-02-08 NOTE — Assessment & Plan Note (Signed)
Under good control.  Can return as needed.

## 2021-02-14 ENCOUNTER — Ambulatory Visit (INDEPENDENT_AMBULATORY_CARE_PROVIDER_SITE_OTHER): Payer: Medicare Other | Admitting: Cardiovascular Disease

## 2021-02-14 ENCOUNTER — Other Ambulatory Visit: Payer: Self-pay

## 2021-02-14 ENCOUNTER — Encounter: Payer: Self-pay | Admitting: Cardiovascular Disease

## 2021-02-14 VITALS — BP 120/70 | HR 73 | Ht 69.0 in | Wt 204.4 lb

## 2021-02-14 DIAGNOSIS — R0602 Shortness of breath: Secondary | ICD-10-CM | POA: Diagnosis not present

## 2021-02-14 DIAGNOSIS — I10 Essential (primary) hypertension: Secondary | ICD-10-CM

## 2021-02-14 NOTE — Progress Notes (Signed)
Cardiology Office Note   Date:  02/14/2021   ID:  Janet [name], DOB 1950-05-26, MRN [id number]  PCP:  Marguerita Merles, MD  Cardiologist:   Kathlyn Sacramento, MD   Chief Complaint  Patient presents with   Other    4 wk f/u no complaints today. Meds reviewed verbally with pt.      History of Present Illness: Janet [name] is a 71 y.o. female who is here today for follow-up visit regarding mild nonobstructive coronary artery disease.   She has known history of palpitations, essential hypertension, recurrent DVTs and chronic venous insufficiency. She was seen in 2018 by Dr. Saralyn Pilar for palpitations and lower extremity edema.  She underwent a dobutamine echocardiogram which showed normal LV systolic function with no ischemia.  24-hour Holter monitor showed occasional PVCs.  Echocardiogram showed an EF of 55 to 60% with mild mitral and tricuspid regurgitation. She was seen recently by me for symptoms of substernal chest pain and tightness that happens with walking and last for 1 to 2 minutes.  It is associated with shortness of breath and dizziness.   She underwent an echocardiogram which showed normal LV systolic function, indeterminate diastolic function, mild mitral regurgitation and minimal pulmonary hypertension.  Cardiac CTA showed coronary calcium score of 4 with minimal proximal RCA plaque and no obstructive disease. She went to the emergency room at [facility] on January 8 with shortness of breath.  She had CTA of the lungs which showed no evidence of pulmonary embolism.  There was bibasilar atelectasis but no active airspace disease.  She was placed on antibiotics but did not tolerate them.  She still complains of dyspnea and cough but no chest pain.  Past Medical History:  Diagnosis Date   Asthma    Cancer ([place])    kidney   Clotting disorder ([place])    Hypertension    Thyroid disease     Past Surgical History:  Procedure Laterality Date   ABDOMINAL HYSTERECTOMY      COLONOSCOPY WITH PROPOFOL N/A 05/25/2019   Procedure: COLONOSCOPY WITH PROPOFOL;  Surgeon: Lin Landsman, MD;  Location: [facility] ENDOSCOPY;  Service: Gastroenterology;  Laterality: N/A;     Current Outpatient Medications  Medication Sig Dispense Refill   albuterol (PROVENTIL HFA;VENTOLIN HFA) 108 (90 BASE) MCG/ACT inhaler Inhale 2 puffs into the lungs every 6 (six) hours as needed for wheezing or shortness of breath. 1 Inhaler 0   aspirin EC 81 MG tablet Take 81 mg by mouth daily.     Butalbital-APAP-Caffeine 50-300-40 MG CAPS take 1 to 2 capsules by mouth every 6 hours if needed for headache     carvedilol (COREG) 6.25 MG tablet Take 6.25 mg by mouth 2 (two) times daily.     Elastic Bandages & Supports (EMS ANTI-EMBOLISM STOCKINGS) MISC Apply in the morning and remove at night.     lisinopril (PRINIVIL,ZESTRIL) 40 MG tablet Take 1 tablet by mouth daily.  0   potassium chloride SA (K-DUR,KLOR-CON) 20 MEQ tablet Take 20 mEq by mouth daily.      traMADol (ULTRAM) 50 MG tablet Take 1-2 tablets (50-100 mg total) by mouth every 6 (six) hours as needed. 30 tablet 0   No current facility-administered medications for this visit.    Allergies:   Amlodipine, Shellfish allergy, Thiazide-type diuretics, and Penicillins    Social History:  The patient  reports that she has never smoked. She has never used smokeless tobacco. She reports that she does not  drink alcohol and does not use drugs.   Family History:  The patient's family history includes Colon cancer in her mother; Diabetes in her father; Heart Problems in her son; Heart attack in her brother, brother, and daughter; Heart attack (age of onset: 75) in her father; Hyperlipidemia in her father; Hypertension in her father and mother.    ROS:  Please see the history of present illness.   Otherwise, review of systems are positive for none.   All other systems are reviewed and negative.    PHYSICAL EXAM: VS:  BP 120/70 (BP Location: Left Arm,  Patient Position: Sitting, Cuff Size: Normal)    Pulse 73    Ht 5\' 9"  (1.753 m)    Wt 204 lb 6 oz (92.7 kg)    SpO2 98%    BMI 30.18 kg/m  , BMI Body mass index is 30.18 kg/m. GEN: Well nourished, well developed, in no acute distress  HEENT: normal  Neck: no JVD, carotid bruits, or masses Cardiac: RRR; no murmurs, rubs, or gallops,no edema  Respiratory:  clear to auscultation bilaterally, normal work of breathing GI: soft, nontender, nondistended, + BS MS: no deformity or atrophy  Skin: warm and dry, no rash Neuro:  Strength and sensation are intact Psych: euthymic mood, full affect   EKG:  EKG is not ordered today. The ekg ordered today demonstrates normal sinus rhythm with no significant ST or T wave changes.   Recent Labs: 01/10/2021: BUN 24; Creatinine, Ser 0.97; Hemoglobin 13.0; Platelets 284; Potassium 3.6; Sodium 138    Lipid Panel No results found for: CHOL, TRIG, HDL, CHOLHDL, VLDL, LDLCALC, LDLDIRECT    Wt Readings from Last 3 Encounters:  02/14/21 204 lb 6 oz (92.7 kg)  02/07/21 202 lb 3.2 oz (91.7 kg)  01/17/21 201 lb 8 oz (91.4 kg)       PAD Screen 01/17/2021  Previous PAD dx? No  Previous surgical procedure? No  Pain with walking? No  Feet/toe relief with dangling? No  Painful, non-healing ulcers? Yes  Extremities discolored? Yes      ASSESSMENT AND PLAN:  1.  Exertional chest pain and shortness of breath: Recent cardiac work-up is unremarkable.  Echocardiogram showed normal LV systolic function, minimal pulmonary hypertension and no significant valvular abnormalities.  Diastolic function was read as grade 2 but it was indeterminate.  Cardiac CTA showed no evidence of obstructive coronary artery disease. I do not have a cardiac explanation for her symptoms.  Recommend pulmonary evaluation.  2.  Essential hypertension: Blood pressure is well controlled on carvedilol and lisinopril.    Disposition:   I referred her to pulmonary for evaluation of  shortness of breath.  Follow-up with [place] as needed.  Signed,  Kathlyn Sacramento, MD  02/14/2021 3:18 PM    [place]

## 2021-02-14 NOTE — Patient Instructions (Signed)
Medication Instructions:  Your physician recommends that you continue on your current medications as directed. Please refer to the Current Medication list given to you today.  *If you need a refill on your cardiac medications before your next appointment, please call your pharmacy*   Lab Work: None ordered If you have labs (blood work) drawn today and your tests are completely normal, you will receive your results only by: [place] (if you have MyChart) OR A paper copy in the mail If you have any lab test that is abnormal or we need to change your treatment, we will call you to review the results.   Testing/Procedures: None ordered   Follow-Up: At [facility], you and your health needs are our priority.  As part of our continuing mission to provide you with exceptional heart care, we have created designated Provider Care Teams.  These Care Teams include your primary Cardiologist (physician) and Advanced Practice Providers (APPs -  Physician Assistants and Nurse Practitioners) who all work together to provide you with the care you need, when you need it.  We recommend signing up for the patient portal called "MyChart".  Sign up information is provided on this After Visit Summary.  MyChart is used to connect with patients for Virtual Visits (Telemedicine).  Patients are able to view lab/test results, encounter notes, upcoming appointments, etc.  Non-urgent messages can be sent to your provider as well.   To learn more about what you can do with MyChart, go to [email or web address].    Your next appointment:   As needed  The format for your next appointment:   In Person  Provider:   You may see Kathlyn Sacramento, MD or one of the following Advanced Practice Providers on your designated Care Team:   Murray Hodgkins, NP Christell Faith, PA-C Cadence Kathlen Mody, PA-C{     Other Instructions You have been referred to [facility]. Their office will call you to schedule an  appointment.

## 2021-02-20 ENCOUNTER — Other Ambulatory Visit: Payer: Self-pay | Admitting: Family Medicine

## 2021-02-20 DIAGNOSIS — Z1231 Encounter for screening mammogram for malignant neoplasm of breast: Secondary | ICD-10-CM

## 2021-03-08 ENCOUNTER — Encounter: Payer: Self-pay | Admitting: Family Medicine

## 2021-03-09 ENCOUNTER — Encounter: Payer: Self-pay | Admitting: *Deleted

## 2021-03-10 ENCOUNTER — Telehealth: Payer: Self-pay | Admitting: *Deleted

## 2021-03-10 NOTE — Telephone Encounter (Signed)
RN attempted to reach NP prior to her apt on Monday 2/27 with Dr. Tasia Catchings. mail box is full- RN unable to leave any msgs

## 2021-03-13 ENCOUNTER — Other Ambulatory Visit: Payer: Self-pay

## 2021-03-13 ENCOUNTER — Encounter: Payer: Self-pay | Admitting: Oncology

## 2021-03-13 ENCOUNTER — Inpatient Hospital Stay: Payer: Medicare Other

## 2021-03-13 ENCOUNTER — Inpatient Hospital Stay: Payer: Medicare Other | Attending: Oncology | Admitting: Oncology

## 2021-03-13 VITALS — BP 150/74 | HR 81 | Temp 96.2°F | Resp 18 | Ht 66.7 in | Wt 203.6 lb

## 2021-03-13 DIAGNOSIS — Z803 Family history of malignant neoplasm of breast: Secondary | ICD-10-CM | POA: Diagnosis not present

## 2021-03-13 DIAGNOSIS — Z800 Family history of malignant neoplasm of digestive organs: Secondary | ICD-10-CM | POA: Diagnosis not present

## 2021-03-13 DIAGNOSIS — Z809 Family history of malignant neoplasm, unspecified: Secondary | ICD-10-CM

## 2021-03-13 DIAGNOSIS — E079 Disorder of thyroid, unspecified: Secondary | ICD-10-CM | POA: Insufficient documentation

## 2021-03-13 DIAGNOSIS — Z87891 Personal history of nicotine dependence: Secondary | ICD-10-CM | POA: Insufficient documentation

## 2021-03-13 DIAGNOSIS — Z79899 Other long term (current) drug therapy: Secondary | ICD-10-CM | POA: Insufficient documentation

## 2021-03-13 DIAGNOSIS — I10 Essential (primary) hypertension: Secondary | ICD-10-CM | POA: Diagnosis not present

## 2021-03-13 DIAGNOSIS — N2889 Other specified disorders of kidney and ureter: Secondary | ICD-10-CM

## 2021-03-13 DIAGNOSIS — J45909 Unspecified asthma, uncomplicated: Secondary | ICD-10-CM | POA: Insufficient documentation

## 2021-03-13 DIAGNOSIS — D4102 Neoplasm of uncertain behavior of left kidney: Secondary | ICD-10-CM | POA: Insufficient documentation

## 2021-03-13 DIAGNOSIS — Z7982 Long term (current) use of aspirin: Secondary | ICD-10-CM | POA: Diagnosis not present

## 2021-03-13 LAB — COMPREHENSIVE METABOLIC PANEL
ALT: 13 U/L (ref 0–44)
AST: 16 U/L (ref 15–41)
Albumin: 3.8 g/dL (ref 3.5–5.0)
Alkaline Phosphatase: 47 U/L (ref 38–126)
Anion gap: 6 (ref 5–15)
BUN: 18 mg/dL (ref 8–23)
CO2: 28 mmol/L (ref 22–32)
Calcium: 9.6 mg/dL (ref 8.9–10.3)
Chloride: 102 mmol/L (ref 98–111)
Creatinine, Ser: 0.87 mg/dL (ref 0.44–1.00)
GFR, Estimated: >60 mL/min (ref >60)
Glucose, Bld: 95 mg/dL (ref 70–99)
Potassium: 4 mmol/L (ref 3.5–5.1)
Sodium: 136 mmol/L (ref 135–145)
Total Bilirubin: 0.3 mg/dL (ref 0.3–1.2)
Total Protein: 7.8 g/dL (ref 6.5–8.1)

## 2021-03-13 LAB — CBC WITH DIFFERENTIAL/PLATELET
Abs Immature Granulocytes: 0.03 10*3/uL (ref 0.00–0.07)
Basophils Absolute: 0 10*3/uL (ref 0.0–0.1)
Basophils Relative: 0 %
Eosinophils Absolute: 0.2 10*3/uL (ref 0.0–0.5)
Eosinophils Relative: 3 %
HCT: 40.3 % (ref 36.0–46.0)
Hemoglobin: 12.9 g/dL (ref 12.0–15.0)
Immature Granulocytes: 1 %
Lymphocytes Relative: 28 %
Lymphs Abs: 1.9 10*3/uL (ref 0.7–4.0)
MCH: 29.5 pg (ref 26.0–34.0)
MCHC: 32 g/dL (ref 30.0–36.0)
MCV: 92.2 fL (ref 80.0–100.0)
Monocytes Absolute: 0.3 10*3/uL (ref 0.1–1.0)
Monocytes Relative: 4 %
Neutro Abs: 4.2 10*3/uL (ref 1.7–7.7)
Neutrophils Relative %: 64 %
Platelets: 297 10*3/uL (ref 150–400)
RBC: 4.37 MIL/uL (ref 3.87–5.11)
RDW: 13 % (ref 11.5–15.5)
WBC: 6.6 10*3/uL (ref 4.0–10.5)
nRBC: 0 % (ref 0.0–0.2)

## 2021-03-13 LAB — LACTATE DEHYDROGENASE: LDH: 130 U/L (ref 98–192)

## 2021-03-13 NOTE — Progress Notes (Signed)
Hematology/Oncology Consult note [phone] [phone]         Patient Care Team: Marguerita Merles, MD as PCP - General (Family Medicine) Lucky Cowboy, Erskine Squibb, MD as Consulting Physician (Vascular Surgery) Earlie Server, MD as Consulting Physician (Hematology and Oncology) Wellington Hampshire, MD as Consulting Physician (Cardiology)  REFERRING PROVIDER: Marguerita Merles, MD  CHIEF COMPLAINTS/REASON FOR VISIT:  Evaluation of renal mass  HISTORY OF PRESENTING ILLNESS:   Janet [name] is a  71 y.o.  female with PMH listed below was seen in consultation at the request of  Marguerita Merles, MD  for evaluation of renal mass  02/28/2021, patient had ultrasound done which showed increased size of left kidney mass, up to 2.9 cm, concerning for renal neoplasm.  Right renal cyst, unchanged.  Patient was referred to establish care with oncology for further evaluation.  Patient reports feeling nervous today.  No unintentional weight loss, night sweats, fever. She is a former smoker. Family history is positive for colon cancer.  Review of Systems  Constitutional:  Negative for appetite change, chills, fatigue and fever.  HENT:   Negative for hearing loss and voice change.   Eyes:  Negative for eye problems.  Respiratory:  Negative for chest tightness and cough.   Cardiovascular:  Negative for chest pain.  Gastrointestinal:  Negative for abdominal distention, abdominal pain and blood in stool.  Endocrine: Negative for hot flashes.  Genitourinary:  Negative for difficulty urinating and frequency.   Musculoskeletal:  Negative for arthralgias.  Skin:  Negative for itching and rash.  Neurological:  Negative for extremity weakness.  Hematological:  Negative for adenopathy.  Psychiatric/Behavioral:  Negative for confusion.    MEDICAL HISTORY:  Past Medical History:  Diagnosis Date   Asthma    Cancer ([place])    kidney   Clotting disorder ([place])    Hypertension    Hypertension    Kidney  mass    Shortness of breath    Thyroid disease    Varicose vein of leg    with leg ulcer    SURGICAL HISTORY: Past Surgical History:  Procedure Laterality Date   ABDOMINAL HYSTERECTOMY     COLONOSCOPY WITH PROPOFOL N/A 05/25/2019   Procedure: COLONOSCOPY WITH PROPOFOL;  Surgeon: Lin Landsman, MD;  Location: [facility] ENDOSCOPY;  Service: Gastroenterology;  Laterality: N/A;    SOCIAL HISTORY: Social History   Socioeconomic History   Marital status: Married    Spouse name: Not on file   Number of children: Not on file   Years of education: Not on file   Highest education level: Not on file  Occupational History   Not on file  Tobacco Use   Smoking status: Former    Types: Cigarettes   Smokeless tobacco: Never  Vaping Use   Vaping Use: Never used  Substance and Sexual Activity   Alcohol use: No   Drug use: No   Sexual activity: Not on file  Other Topics Concern   Not on file  Social History Narrative   Not on file   Social Determinants of Health   Financial Resource Strain: Not on file  Food Insecurity: Not on file  Transportation Needs: Not on file  Physical Activity: Not on file  Stress: Not on file  Social Connections: Not on file  Intimate Partner Violence: Not on file    FAMILY HISTORY: Family History  Problem Relation Age of Onset   Colon cancer Mother    Hypertension Mother  Heart attack Father 54   Hypertension Father    Hyperlipidemia Father    Diabetes Father    Heart attack Brother    Heart attack Brother    Heart attack Daughter    Heart Problems Son    Stroke Neg Hx    Breast cancer Neg Hx     ALLERGIES:  is allergic to amlodipine, shellfish allergy, thiazide-type diuretics, and penicillins.  MEDICATIONS:  Current Outpatient Medications  Medication Sig Dispense Refill   albuterol (PROVENTIL HFA;VENTOLIN HFA) 108 (90 BASE) MCG/ACT inhaler Inhale 2 puffs into the lungs every 6 (six) hours as needed for wheezing or shortness of  breath. 1 Inhaler 0   aspirin EC 81 MG tablet Take 81 mg by mouth daily.     Butalbital-APAP-Caffeine 50-300-40 MG CAPS take 1 to 2 capsules by mouth every 6 hours if needed for headache     Elastic Bandages & Supports (EMS ANTI-EMBOLISM STOCKINGS) MISC Apply in the morning and remove at night.     lisinopril (PRINIVIL,ZESTRIL) 40 MG tablet Take 1 tablet by mouth daily.  0   potassium chloride SA (K-DUR,KLOR-CON) 20 MEQ tablet Take 20 mEq by mouth daily.      No current facility-administered medications for this visit.     PHYSICAL EXAMINATION: ECOG PERFORMANCE STATUS: 0 - Asymptomatic Vitals:   03/13/21 1125  BP: (!) 150/74  Pulse: 81  Resp: 18  Temp: (!) 96.2 F (35.7 C)  SpO2: 100%   Filed Weights   03/13/21 1125  Weight: 203 lb 9.6 oz (92.4 kg)    Physical Exam Constitutional:      General: She is not in acute distress. HENT:     Head: Normocephalic and atraumatic.  Eyes:     General: No scleral icterus. Cardiovascular:     Rate and Rhythm: Normal rate and regular rhythm.     Heart sounds: Normal heart sounds.  Pulmonary:     Effort: Pulmonary effort is normal. No respiratory distress.     Breath sounds: No wheezing.  Abdominal:     General: Bowel sounds are normal. There is no distension.     Palpations: Abdomen is soft.  Musculoskeletal:        General: No deformity. Normal range of motion.     Cervical back: Normal range of motion and neck supple.  Skin:    General: Skin is warm and dry.     Findings: No erythema or rash.  Neurological:     Mental Status: She is alert and oriented to person, place, and time. Mental status is at baseline.     Cranial Nerves: No cranial nerve deficit.     Coordination: Coordination normal.  Psychiatric:        Mood and Affect: Mood normal.    LABORATORY DATA:  I have reviewed the data as listed Lab Results  Component Value Date   WBC 6.6 03/13/2021   HGB 12.9 03/13/2021   HCT 40.3 03/13/2021   MCV 92.2 03/13/2021    PLT 297 03/13/2021   Recent Labs    01/10/21 1935 03/13/21 1154  NA 138 136  K 3.6 4.0  CL 107 102  CO2 26 28  GLUCOSE 87 95  BUN 24* 18  CREATININE 0.97 0.87  CALCIUM 9.4 9.6  GFRNONAA >60 >60  PROT  --  7.8  ALBUMIN  --  3.8  AST  --  16  ALT  --  13  ALKPHOS  --  47  BILITOT  --  0.3   Iron/TIBC/Ferritin/ %Sat No results found for: IRON, TIBC, FERRITIN, IRONPCTSAT    RADIOGRAPHIC STUDIES: I have personally reviewed the radiological images as listed and agreed with the findings in the report. No results found.    ASSESSMENT & PLAN:  1. Renal mass   2. Former smoker   3. Family history of cancer    #Enlarging left renal mass, Check CBC, CMP LDH. Recommend CT abdomen pelvis with contrast for further evaluation.  Patient agrees with plan.  #Former smoker, recent CTA showed no significant extracardiac findings. #Family history of cancer.  Consider genetic testing. Orders Placed This Encounter  Procedures   CT Abdomen Pelvis W Contrast    Standing Status:   Future    Standing Expiration Date:   03/13/2022    Order Specific Question:   If indicated for the ordered procedure, I authorize the administration of contrast media per Radiology protocol    Answer:   Yes    Order Specific Question:   Preferred imaging location?    Answer:   [facility]    Order Specific Question:   Is Oral Contrast requested for this exam?    Answer:   Yes, Per Radiology protocol   Comprehensive metabolic panel    Standing Status:   Future    Number of Occurrences:   1    Standing Expiration Date:   03/13/2022   CBC with Differential/Platelet    Standing Status:   Future    Number of Occurrences:   1    Standing Expiration Date:   03/13/2022   Lactate dehydrogenase    Standing Status:   Future    Number of Occurrences:   1    Standing Expiration Date:   03/13/2022    All questions were answered. The patient knows to call the clinic with any problems questions or  concerns.  cc Marguerita Merles, MD    Return of visit: Follow-up TBD depending on CT results. Thank you for this kind referral and the opportunity to participate in the care of this patient. A copy of today's note is routed to referring provider   Earlie Server, MD, PhD [facility] Hematology Oncology 03/13/2021

## 2021-03-13 NOTE — Progress Notes (Signed)
New diagnosis of Renal mass. Pt was told she does have kidney cancer.

## 2021-03-16 ENCOUNTER — Other Ambulatory Visit: Payer: Self-pay

## 2021-03-16 ENCOUNTER — Encounter: Payer: Self-pay | Admitting: Internal Medicine

## 2021-03-16 ENCOUNTER — Ambulatory Visit (INDEPENDENT_AMBULATORY_CARE_PROVIDER_SITE_OTHER): Payer: Medicare Other | Admitting: Internal Medicine

## 2021-03-16 DIAGNOSIS — R0609 Other forms of dyspnea: Secondary | ICD-10-CM | POA: Diagnosis not present

## 2021-03-16 DIAGNOSIS — I10 Essential (primary) hypertension: Secondary | ICD-10-CM | POA: Diagnosis not present

## 2021-03-16 MED ORDER — OLMESARTAN MEDOXOMIL 40 MG PO TABS: 40.0000 mg | ORAL_TABLET | Freq: Every day | ORAL | 11 refills | Status: DC

## 2021-03-16 NOTE — Assessment & Plan Note (Signed)
Onset around xmax of 2022  ?- 03/16/2021   Walked on RA  x  3   lap(s) =  approx 525  ft  @ mod fast pace, stopped due to end of study with lowest 02 sats 98  %  ?- try off ACEi 03/16/2021 >>>  ? ? ?When respiratory symptoms become atypical or  become refractory well after a patient reports complete smoking cessation, especially when this wasn't the case while they were smoking, a red flag is raised based on the work of Dr Kris Mouton which states: if you quit smoking when your best day FEV1 is still well preserved it is highly unlikely you will progress to severe disease. ? ?That is to say, once the smoking stops,  the symptoms should not suddenly erupt or markedly worsen.  If so, the differential diagnosis should include  obesity/deconditioning,  LPR/Reflux/Aspiration syndromes,  occult CHF, or   side effect of medications commonly used in this population - especially ACEi   ? ?Will start with d/c aci and f/u in 4-6 weeks with pfts if not better  ?

## 2021-03-16 NOTE — Assessment & Plan Note (Signed)
D/c acei 03/16/2021 due to unexplained cough/ sob  ? ?In the best review of chronic cough to date ( NEJM 2016 [phone]) ,  ACEi are now felt to cause cough in up to  20% of pts which is a 4 fold increase from previous reports and does not include the variety of non-specific complaints we see in pulmonary clinic in pts on ACEi but previously attributed to another dx like  Copd/asthma and  include PNDS, throat  congestion, "bronchitis", unexplained dyspnea and noct "strangling" sensations, and hoarseness, but also  atypical /refractory GERD symptoms like dysphagia and "bad heartburn"  ? ?The only way I know  to prove this is not an "ACEi Case" is a trial off ACEi x a minimum of 6 weeks then regroup.  ? ?>>> change lisinopril 40 to benicar 40 mg and f/u in 4-6 weeks if not better to her satisfaction  ? ?Each maintenance medication was reviewed in detail including emphasizing most importantly the difference between maintenance and prns and under what circumstances the prns are to be triggered using an action plan format where appropriate. ? ?Total time for H and P, chart review, counseling,  directly observing portions of ambulatory 02 saturation study/ and generating customized AVS unique to this office visit / same day charting  > 32min ?     ?  ?       ?

## 2021-03-16 NOTE — Patient Instructions (Addendum)
We will walk today for a baseline ? ?Stop lisinopril  ? ?Start olmesartan 40 mg one daily and check  blood pressure every  few days and call me if there's a problem ? ? ?Your cough and breathing problems should resolve over the next few weeks and if not please call for follow up  ? ? ? ? ? ? ? ? ?

## 2021-03-16 NOTE — Progress Notes (Signed)
? ?Janet [name], female    DOB: 23-Jun-1950,    MRN: [id number] ? ? ?Brief patient profile:  ?73   yobf  quit smoking 1995 with childhood asthma out grew by HS with some cough/ wheeze smoking better p quit smoking on prn saba referred to pulmonary clinic in [facility]  03/16/2021 by Dr Stephan Minister  worse sob and cough since around xmas 2023 ? ? ? ? ?History of Present Illness  ?03/16/2021  Pulmonary/ 1st office eval/ Melvyn Novas / [place]  ?Chief Complaint  ?Patient presents with  ? pulmonary consult  ?  Per Dr. Aleene Davidson sob with exertion and prod cough with yellow sputum occ mixed with a small amount of blood x54mo  ?Dyspnea:  2 months worse, walk across parking lot, leaning on basket to do [employer] and legs get weak too  ?Cough: worse in am x 6 m and traces of dark mucus ? Streaks of blood  ?Sleep  bed is flat / 4 pillows under head x years due to / smothering when lies flat  ?SABA use: one week prior  ?Midline cp from coughing fits, hoarseness and urge to clear throat but no active sinus symptoms otherwise  ? ?No obvious day to day or daytime variability or assoc excess/ purulent sputum or mucus plugs or    chest tightness, subjective wheeze or overt  hb symptoms.  ? ?  Also denies any obvious fluctuation of symptoms with weather or environmental changes or other aggravating or alleviating factors except as outlined above  ? ?No unusual exposure hx or h/o childhood pna/ asthma or knowledge of premature birth. ? ?Current Allergies, Complete Past Medical History, Past Surgical History, Family History, and Social History were reviewed in [employer] record. ? ?ROS  The following are not active complaints unless bolded ?Hoarseness, sore throat, dysphagia, dental problems, itching, sneezing,  nasal congestion or discharge of excess mucus or purulent secretions, ear ache,   fever, chills, sweats, unintended wt loss or wt gain, classically pleuritic or exertional cp,  orthopnea pnd or arm/hand swelling   or leg swelling, presyncope, palpitations, abdominal pain, anorexia, nausea, vomiting, diarrhea  or change in bowel habits or change in bladder habits, change in stools or change in urine, dysuria, hematuria,  rash, arthralgias, visual complaints, headache, numbness, weakness or ataxia or problems with walking or coordination,  change in mood or  memory. ?      ?   ? ?Past Medical History:  ?Diagnosis Date  ? Asthma   ? Cancer [facility])   ? kidney  ? Clotting disorder ([place])   ? Hypertension   ? Hypertension   ? Kidney mass   ? Shortness of breath   ? Thyroid disease   ? Varicose vein of leg   ? with leg ulcer  ? ? ?Outpatient Medications Prior to Visit  ?Medication Sig Dispense Refill  ? albuterol (PROVENTIL HFA;VENTOLIN HFA) 108 (90 BASE) MCG/ACT inhaler Inhale 2 puffs into the lungs every 6 (six) hours as needed for wheezing or shortness of breath. 1 Inhaler 0  ? aspirin EC 81 MG tablet Take 81 mg by mouth daily.    ? Butalbital-APAP-Caffeine 50-300-40 MG CAPS take 1 to 2 capsules by mouth every 6 hours if needed for headache    ? Elastic Bandages & Supports (EMS ANTI-EMBOLISM STOCKINGS) MISC Apply in the morning and remove at night.    ? lisinopril (PRINIVIL,ZESTRIL) 40 MG tablet Take 1 tablet by mouth daily.  0  ?  potassium chloride SA (K-DUR,KLOR-CON) 20 MEQ tablet Take 20 mEq by mouth daily.     ? ?No facility-administered medications prior to visit.  ? ? ? ?Objective:  ?  ? ?BP 124/70 (BP Location: Left Arm, Cuff Size: Normal)   Pulse 91   Temp 97.8 ?F (36.6 ?C) (Temporal)   Ht 5\' 6"  (1.676 m)   Wt 203 lb 6.4 oz (92.3 kg)   SpO2 97%   BMI 32.83 kg/m?  ? ?SpO2: 97 % ? ?Pleasant amb bf nad  ? ? HEENT : pt wearing mask not removed for exam due to covid -19 concerns.  ? ? ?NECK :  without JVD/Nodes/TM/ nl carotid upstrokes bilaterally ? ? ?LUNGS: no acc muscle use,  Nl contour chest which is clear to A and P bilaterally without cough on insp or exp maneuvers ? ? ?CV:  RRR  no s3 or murmur or increase in P2, and  no edema  ? ?ABD:  soft and nontender with nl inspiratory excursion in the supine position. No bruits or organomegaly appreciated, bowel sounds nl ? ?MS:  Nl gait/ ext warm without deformities, calf tenderness, cyanosis or clubbing ?No obvious joint restrictions  ? ?SKIN: warm and dry without lesions   ? ?NEURO:  alert, approp, nl sensorium with  no motor or cerebellar deficits apparent.  ? ? ? ?I personally reviewed images and agree with radiology impression as follows:  ? Chest CTa  1 /8/23  ?No pe/no nodules/min bibasilar atx  ? ? ?   ?Assessment  ? ?Dyspnea on exertion ?Onset around xmax of 2022  ?- 03/16/2021   Walked on RA  x  3   lap(s) =  approx 525  ft  @ mod fast pace, stopped due to end of study with lowest 02 sats 98  %  ?- try off ACEi 03/16/2021 >>>  ? ? ?When respiratory symptoms become atypical or  become refractory well after a patient reports complete smoking cessation, especially when this wasn't the case while they were smoking, a red flag is raised based on the work of Dr Kris Mouton which states: if you quit smoking when your best day FEV1 is still well preserved it is highly unlikely you will progress to severe disease. ? ?That is to say, once the smoking stops,  the symptoms should not suddenly erupt or markedly worsen.  If so, the differential diagnosis should include  obesity/deconditioning,  LPR/Reflux/Aspiration syndromes,  occult CHF, or   side effect of medications commonly used in this population - especially ACEi   ? ?Will start with d/c aci and f/u in 4-6 weeks with pfts if not better  ? ? ?Essential hypertension ?D/c acei 03/16/2021 due to unexplained cough/ sob  ? ?In the best review of chronic cough to date ( NEJM 2016 375 [phone]) ,  ACEi are now felt to cause cough in up to  20% of pts which is a 4 fold increase from previous reports and does not include the variety of non-specific complaints we see in pulmonary clinic in pts on ACEi but previously attributed to another dx like   Copd/asthma and  include PNDS, throat  congestion, "bronchitis", unexplained dyspnea and noct "strangling" sensations, and hoarseness, but also  atypical /refractory GERD symptoms like dysphagia and "bad heartburn"  ? ?The only way I know  to prove this is not an "ACEi Case" is a trial off ACEi x a minimum of 6 weeks then regroup.  ? ?>>> change lisinopril 40 to  benicar 40 mg and f/u in 4-6 weeks if not better to her satisfaction  ? ?Each maintenance medication was reviewed in detail including emphasizing most importantly the difference between maintenance and prns and under what circumstances the prns are to be triggered using an action plan format where appropriate. ? ?Total time for H and P, chart review, counseling,  directly observing portions of ambulatory 02 saturation study/ and generating customized AVS unique to this office visit / same day charting  > 20min ?     ? ? ?Christinia Gully, MD ?03/16/2021 ?    ?

## 2021-03-23 ENCOUNTER — Ambulatory Visit
Admission: RE | Admit: 2021-03-23 | Discharge: 2021-03-23 | Disposition: A | Discharge status: Home / Self Care | Payer: Medicare Other | Source: Ambulatory Visit | Attending: Oncology | Admitting: Oncology

## 2021-03-23 ENCOUNTER — Other Ambulatory Visit: Payer: Self-pay

## 2021-03-23 DIAGNOSIS — N2889 Other specified disorders of kidney and ureter: Secondary | ICD-10-CM | POA: Diagnosis not present

## 2021-03-23 MED ORDER — IOHEXOL 300 MG/ML  SOLN
100.0000 mL | Freq: Once | INTRAMUSCULAR | Status: AC | PRN
  Administered 2021-03-23: 13:00:00 100 mL via INTRAVENOUS

## 2021-03-24 ENCOUNTER — Other Ambulatory Visit: Payer: Self-pay

## 2021-03-24 ENCOUNTER — Telehealth: Payer: Self-pay

## 2021-03-24 DIAGNOSIS — N2889 Other specified disorders of kidney and ureter: Secondary | ICD-10-CM

## 2021-03-24 DIAGNOSIS — Z87891 Personal history of nicotine dependence: Secondary | ICD-10-CM

## 2021-03-24 DIAGNOSIS — Z809 Family history of malignant neoplasm, unspecified: Secondary | ICD-10-CM

## 2021-03-24 DIAGNOSIS — R93429 Abnormal radiologic findings on diagnostic imaging of unspecified kidney: Secondary | ICD-10-CM

## 2021-03-24 NOTE — Telephone Encounter (Signed)
Spoke with patient and informed her of CT results. Advised patient of Dr Collie Siad recommendation to establish care with Urology and have an additional CT scan done. Informed patient that Dr. Doristine Counter office will contact her to set up appointment to establish care. (Referral placed) Also, informed patient that our scheduler would contact her to schedule her CT chest. Patient verbalized understanding.  ? ? ?Brooke, please schedule patient for CT chest w/contrast next available. Order is in. Please notify patient of appointment. Thanks  ?

## 2021-03-24 NOTE — Telephone Encounter (Signed)
-----   Message from Earlie Server, MD sent at 03/23/2021 11:29 PM EST ----- ?Let her know that CT showed a mass in her left kidney, suspicious for kidney cancer.  ?I recommend her to establish care with Urology. Please refer to Dr.Sninsky. ? ?

## 2021-03-27 ENCOUNTER — Ambulatory Visit
Admission: RE | Admit: 2021-03-27 | Discharge: 2021-03-27 | Disposition: A | Discharge status: Home / Self Care | Payer: Medicare Other | Source: Ambulatory Visit | Attending: Family Medicine | Admitting: Family Medicine

## 2021-03-27 ENCOUNTER — Other Ambulatory Visit: Payer: Self-pay

## 2021-03-27 DIAGNOSIS — Z1231 Encounter for screening mammogram for malignant neoplasm of breast: Secondary | ICD-10-CM | POA: Insufficient documentation

## 2021-03-28 ENCOUNTER — Telehealth: Payer: Self-pay | Admitting: *Deleted

## 2021-03-28 NOTE — Telephone Encounter (Signed)
Patient called stating that she is in process of having tests run, but she is feeling weak and tired she could not be more specific and just kept saying when asked for symptoms that she is feeling weak and tired her legs are weak. She states she is eating and drinking well. Please advise ?

## 2021-03-28 NOTE — Telephone Encounter (Signed)
Call returned to patient and advised that she needs to contact her PCP for this current problem as we have not yet started treatment on her. Patient in agreement to call PCP ?

## 2021-03-29 ENCOUNTER — Inpatient Hospital Stay: Payer: Medicare Other | Attending: Oncology | Admitting: Hospice and Palliative Medicine

## 2021-03-29 DIAGNOSIS — N2889 Other specified disorders of kidney and ureter: Secondary | ICD-10-CM

## 2021-03-29 NOTE — Progress Notes (Signed)
Multidisciplinary Oncology Council Documentation ? ?Janet [name] was presented by our [facility] on 03/29/2021, which included representatives from:  ?Palliative Care ?Dietitian  ?Physical/Occupational Therapist ?Nurse Navigator ?Genetics ?Speech Therapist ?Social work ?Survivorship RN ?Financial Navigator ?Research RN ? ? ?Janet [name] currently presents with history of renal mass. ? ?We reviewed previous medical and familial history, history of present illness, and recent lab results along with all available histopathologic and imaging studies. [facility] [place] considered available treatment options and made the following recommendations/referrals: ? ?Social work ?Genetics ? ?The MOC is a meeting of clinicians from various specialty areas who evaluate and discuss patients for whom a multidisciplinary approach is being considered. Final determinations in the plan of care are those of the provider(s).  ? ?Today's extended care, comprehensive team conference, Janet [name] was not present for the discussion and was not examined.   ?

## 2021-03-31 ENCOUNTER — Ambulatory Visit
Admission: RE | Admit: 2021-03-31 | Discharge: 2021-03-31 | Disposition: A | Discharge status: Home / Self Care | Payer: Medicare Other | Source: Ambulatory Visit | Attending: Oncology | Admitting: Oncology

## 2021-03-31 ENCOUNTER — Other Ambulatory Visit: Payer: Self-pay

## 2021-03-31 DIAGNOSIS — Z87891 Personal history of nicotine dependence: Secondary | ICD-10-CM | POA: Insufficient documentation

## 2021-03-31 DIAGNOSIS — R93429 Abnormal radiologic findings on diagnostic imaging of unspecified kidney: Secondary | ICD-10-CM | POA: Insufficient documentation

## 2021-03-31 MED ORDER — IOHEXOL 300 MG/ML  SOLN
75.0000 mL | Freq: Once | INTRAMUSCULAR | Status: AC | PRN
  Administered 2021-03-31: 75 mL via INTRAVENOUS

## 2021-04-07 ENCOUNTER — Encounter: Payer: Self-pay | Admitting: Licensed Clinical Social Worker

## 2021-04-10 ENCOUNTER — Telehealth: Payer: Self-pay

## 2021-04-10 NOTE — Telephone Encounter (Signed)
-----   Message from Earlie Server, MD sent at 04/08/2021 11:24 PM EDT ----- ?Has she established care with urology yet? Please follow up.  ? ?

## 2021-04-10 NOTE — Telephone Encounter (Signed)
Spoke to [facility] and they will call pt with appt today.  ?

## 2021-04-10 NOTE — Progress Notes (Signed)
[facility] Clinical Social Work  ?Initial Assessment ? ? ?Janet [name] is a 71 y.o. year old female contacted by phone. Clinical Social Work was referred by medical provider for assessment of psychosocial needs.  ? ?SDOH (Social Determinants of Health) assessments performed: Yes ?SDOH Interventions   ? ?Flowsheet Row Most Recent Value  ?SDOH Interventions   ?Food Insecurity Interventions Intervention Not Indicated  ?Financial Strain Interventions Intervention Not Indicated  ?Housing Interventions Intervention Not Indicated  ?Intimate Partner Violence Interventions Intervention Not Indicated  ?Physical Activity Interventions Intervention Not Indicated  ?Stress Interventions Intervention Not Indicated  ?Social Connections Interventions Intervention Not Indicated  ?Transportation Interventions Intervention Not Indicated  ? ?  ?  ?Distress Screen completed: No ?   ? View : No data to display.  ?  ?  ?  ? ? ? ? ?Family/Social Information:  ?Housing Arrangement: patient lives with spouse Janet [name], Janet [name] [phone] ?Family members/support persons in your life? Family and Friends/Colleagues ?Transportation concerns: no  ?Employment: Retired. Income source: [employer] ?Financial concerns: No ?Type of concern: None ?Food access concerns: no ?Religious or spiritual practice: yes ?Services Currently in place:  Medicare, [place] ? ?Coping/ Adjustment to diagnosis: ?Patient understands treatment plan and what happens next? yes ?Concerns about diagnosis and/or treatment: Pain or discomfort during procedures and Overwhelmed by information ?Patient reported stressors: Anxiety and Adjusting to my illness ?Hopes and priorities: N/A ?Patient enjoys being outside and time with family/ friends ?Current coping skills/ strengths: Average or above average intelligence , Capable of independent living , Communication skills , Financial means , Motivation for treatment/growth , Physical Health , and Supportive family/friends  ? ? ?  SUMMARY: ?Current SDOH Barriers:  ?Patient does not have SDOH barriers ? ?Clinical Social Work Clinical Goal(s):  ?Patient stated she is currently "feeling well, and do not have concerns." ? ?Interventions: ?Discussed common feeling and emotions when being diagnosed with cancer, and the importance of support during treatment ?Informed patient of the support team roles and support services at [facility] ?Provided CSW contact information and encouraged patient to call with any questions or concerns ?Provided patient with information about CSW role in patient care and available resources. ? ? ?Follow Up Plan: Patient will contact CSW with any support or resource needs ?Patient verbalizes understanding of plan: Yes ? ? ? ?Rmoni Keplinger, LCSW ?

## 2021-04-18 ENCOUNTER — Ambulatory Visit (INDEPENDENT_AMBULATORY_CARE_PROVIDER_SITE_OTHER): Payer: Medicare Other | Admitting: Urology

## 2021-04-18 ENCOUNTER — Encounter: Payer: Self-pay | Admitting: Urology

## 2021-04-18 VITALS — BP 138/74 | HR 73 | Ht 66.0 in | Wt 207.0 lb

## 2021-04-18 DIAGNOSIS — N368 Other specified disorders of urethra: Secondary | ICD-10-CM

## 2021-04-18 DIAGNOSIS — N2889 Other specified disorders of kidney and ureter: Secondary | ICD-10-CM | POA: Diagnosis not present

## 2021-04-18 NOTE — Patient Instructions (Signed)
Renal Mass ?A renal mass is an abnormal growth in the kidney. It may be found while performing an MRI, CT scan, or ultrasound to evaluate other problems of the abdomen. A renal mass that is cancerous (malignant) may grow or spread quickly. Others are not cancerous (benign). ?Renal masses include: ?Tumors. These may be malignant or benign. ?The most common type of kidney cancer in adults is renal cell carcinoma. In children, the most common type of kidney cancer is Wilms tumor. ?The most common benign tumors of the kidney include renal adenomas, oncocytomas, and angiomyolipoma (AML). ?Cysts. These are fluid-filled sacs that form on or in the kidney. ?What are the causes? ?Certain types of cancers, infections, or injuries can cause a renal mass. It is not always known what causes a cyst to develop in or on the kidney. ?What are the signs or symptoms? ?Often, a renal mass does not cause any signs or symptoms; most kidney cysts do not cause symptoms. ?How is this diagnosed? ?Your health care provider may recommend tests to diagnose the cause of your renal mass. These tests may be done if a renal mass is found: ?Physical exam. ?Blood tests. ?Urine tests. ?Imaging tests, such as ultrasound, CT scan, or MRI. ?Biopsy. This is a small sample that is removed from the renal mass and tested in a lab. ?The exact tests and how often they are done will depend on: ?The size and appearance of the renal mass. ?Risk factors or medical conditions that increase your risk for problems. ?Any symptoms associated with the renal mass, or concerns that you have about it. ?Tests and physical exams may be done once, or they may be done regularly for a period of time. Tests and exams that are done regularly will help monitor whether the mass is growing and beginning to cause problems. ?How is this treated? ?Treatment is not always needed for this condition. Your health care provider may recommend careful monitoring and regular tests and exams.  Treatment will depend on the cause of the mass. ?Treatment for a cancerous renal mass may include surgical removal, chemotherapy, radiation, or immunotherapy. ?Most kidney cysts do not need to be treated. ?Follow these instructions at home: ?What you need to do at home will depend on the cause of the mass. Follow the instructions that your health care provider gives to you. In general: ?Take over-the-counter and prescription medicines only as told by your health care provider. ?If you were prescribed an antibiotic medicine, take it as told by your health care provider. Do not stop taking the antibiotic even if you start to feel better. ?Follow any restrictions that are given to you by your health care provider. ?Keep all follow-up visits. This is important. ?You may need to see your health care provider once or twice a year to have CT scans and ultrasounds. These tests will show if your renal mass has changed or grown. ?Contact a health care provider if you: ?Have pain in your side or back (flank pain). ?Have a fever. ?Feel full soon after eating. ?Have pain or swelling in the abdomen. ?Lose weight. ?Get help right away if: ?Your pain gets worse. ?There is blood in your urine. ?You cannot urinate. ?You have chest pain. ?You have trouble breathing. ?These symptoms may represent a serious problem that is an emergency. Do not wait to see if the symptoms will go away. Get medical help right away. Call your local emergency services (911 in the [place] ?Summary ?A renal mass is an abnormal  growth in the kidney. It may be cancerous (malignant) and grow or spread quickly, or it may not be cancerous (benign). Renal masses often do not have any signs or symptoms. ?Renal masses may be found while performing an MRI, CT scan, or ultrasound for other problems of the abdomen. ?Your health care provider may recommend that you have tests to diagnose the cause of your renal mass. These may include a physical exam, blood tests, urine  tests, imaging, or a biopsy. ?Treatment is not always needed for this condition. Careful monitoring may be recommended. ?This information is not intended to replace advice given to you by your health care provider. Make sure you discuss any questions you have with your health care provider. ?Document Revised: 06/29/2019 Document Reviewed: 06/29/2019 ?Elsevier Patient Education ? [employer]. ? ?Overactive Bladder, Adult ?Overactive bladder is a condition in which a person has a sudden and frequent need to urinate. A person might also leak urine if he or she cannot get to the bathroom fast enough (urinary incontinence). Sometimes, symptoms can interfere with work or social activities. ?What are the causes? ?Overactive bladder is associated with poor nerve signals between your bladder and your brain. Your bladder may get the signal to empty before it is full. You may also have very sensitive muscles that make your bladder squeeze too soon. This condition may also be caused by other factors, such as: ?Medical conditions: ?Urinary tract infection. ?Infection of nearby tissues. ?Prostate enlargement. ?Bladder stones, inflammation, or tumors. ?Diabetes. ?Muscle or nerve weakness, especially from these conditions: ?A spinal cord injury. ?Stroke. ?Multiple sclerosis. ?Parkinson's disease. ?Other causes: ?Surgery on the uterus or urethra. ?Drinking too much caffeine or alcohol. ?Certain medicines, especially those that eliminate extra fluid in the body (diuretics). ?Constipation. ?What increases the risk? ?You may be at greater risk for overactive bladder if you: ?Are an older adult. ?Smoke. ?Are going through menopause. ?Have prostate problems. ?Have a neurological disease, such as stroke, dementia, Parkinson's disease, or multiple sclerosis (MS). ?Eat or drink alcohol, spicy food, caffeine, and other things that irritate the bladder. ?Are overweight or obese. ?What are the signs or symptoms? ?Symptoms of this  condition include a sudden, strong urge to urinate. Other symptoms include: ?Leaking urine. ?Urinating 8 or more times a day. ?Waking up to urinate 2 or more times overnight. ?How is this diagnosed? ?This condition may be diagnosed based on: ?Your symptoms and medical history. ?A physical exam. ?Blood or urine tests to check for possible causes, such as infection. ?You may also need to see a health care provider who specializes in urinary tract problems. This is called a urologist. ?How is this treated? ?Treatment for overactive bladder depends on the cause of your condition and whether it is mild or severe. Treatment may include: ?Bladder training, such as: ?Learning to control the urge to urinate by following a schedule to urinate at regular intervals. ?Doing Kegel exercises to strengthen the pelvic floor muscles that support your bladder. ?Special devices, such as: ?Biofeedback. This uses sensors to help you become aware of your body's signals. ?Electrical stimulation. This uses electrodes placed inside the body (implanted) or outside the body. These electrodes send gentle pulses of electricity to strengthen the nerves or muscles that control the bladder. ?Women may use a plastic device, called a pessary, that fits into the vagina and supports the bladder. ?Medicines, such as: ?Antibiotics to treat bladder infection. ?Antispasmodics to stop the bladder from releasing urine at the wrong time. ?Tricyclic antidepressants to  relax bladder muscles. ?Injections of botulinum toxin type A directly into the bladder tissue to relax bladder muscles. ?Surgery, such as: ?A device may be implanted to help manage the nerve signals that control urination. ?An electrode may be implanted to stimulate electrical signals in the bladder. ?A procedure may be done to change the shape of the bladder. This is done only in very severe cases. ?Follow these instructions at home: ?Eating and drinking ? ?Make diet or lifestyle changes  recommended by your health care provider. These may include: ?Drinking fluids throughout the day and not only with meals. ?Cutting down on caffeine or alcohol. ?Eating a healthy and balanced diet to prevent constipat

## 2021-04-18 NOTE — Progress Notes (Signed)
? ?04/18/21 ?12:51 PM  ? ?Janet [name] ?August 09, 1950 ?[id number] ? ?CC: Left renal mass, urinary symptoms, prolapse ? ?HPI: ?71 year old female referred for the above issues.  Her primary reason for referral is her 2.5 cm left renal mass that has essentially been stable over the last 5 to 6 years.  She has previously been on active surveillance for this lesion through [facility], but from our conversation today I get the sense she did not have a good understanding of her CT findings and the concept of active surveillance. ? ?She reports fatigue and decreased energy over the last 6 months that she feels could be related to her left renal mass. ? ?She also have some urinary symptoms of bulge and prolapse, that she has to reduce to improve voiding.  She has a history of a hysterectomy. ? ? ?PMH: ?Past Medical History:  ?Diagnosis Date  ? Asthma   ? Cancer [facility])   ? kidney  ? Clotting disorder ([place])   ? Hypertension   ? Hypertension   ? Kidney mass   ? Shortness of breath   ? Thyroid disease   ? Varicose vein of leg   ? with leg ulcer  ? ? ?Surgical History: ?Past Surgical History:  ?Procedure Laterality Date  ? ABDOMINAL HYSTERECTOMY    ? COLONOSCOPY WITH PROPOFOL N/A 05/25/2019  ? Procedure: COLONOSCOPY WITH PROPOFOL;  Surgeon: Lin Landsman, MD;  Location: [facility] ENDOSCOPY;  Service: Gastroenterology;  Laterality: N/A;  ? ? ? ?Family History: ?Family History  ?Problem Relation Age of Onset  ? Colon cancer Mother   ? Hypertension Mother   ? Heart attack Father 50  ? Hypertension Father   ? Hyperlipidemia Father   ? Diabetes Father   ? Heart attack Brother   ? Heart attack Brother   ? Heart attack Daughter   ? Heart Problems Son   ? Stroke Neg Hx   ? Breast cancer Neg Hx   ? ? ?Social History:  reports that she quit smoking about 28 years ago. Her smoking use included cigarettes. She has been exposed to tobacco smoke. She has never used smokeless tobacco. She reports that she does not drink alcohol and does not use  drugs. ? ?Physical Exam: ?BP 138/74   Pulse 73   Ht '5\' 6"'$  (1.676 m)   Wt 207 lb (93.9 kg)   BMI 33.41 kg/m?   ? ?Constitutional:  Alert and oriented, No acute distress. ?Cardiovascular: No clubbing, cyanosis, or edema. ?Respiratory: Normal respiratory effort, no increased work of breathing. ?GI: Abdomen is soft, nontender, nondistended, no abdominal masses ? ?Laboratory Data: ?Reviewed, normal renal function ? ?Pertinent Imaging: ?I have personally viewed and interpreted the most recent CT abdomen and pelvis with contrast dated 03/23/2021, as well as the previous imaging with CT and MRI, some of which has been performed at [facility].  Overall the left renal upper pole lesion is very stable measuring about 2.5 cm. ? ?Assessment & Plan:   ?71 year old female with essentially stable 2.5 cm left renal mass that has been on active surveillance and followed primarily at [facility] over the last 5 to 6 years.  I get the sense today she does not have a great understanding of the concept of active surveillance, and she is very worried about the idea of cancer and that this could be causing her fatigue.  We had a long conversation about small renal masses today, and we reviewed her images from as far back as 2018 that shows  minimal change in the size of this left-sided lesion.  We discussed alternatives active surveillance including renal mass biopsy and percutaneous ablation, and surgery with partial nephrectomy or radical nephrectomy.  The lesion would be amenable to partial nephrectomy I think.  She is extremely averse to surgery, but would consider percutaneous ablation.  I think active surveillance or ablation would both be reasonable, and the mass has overall been very stable over the last 5 to 6 years.  I reassured her that it would be very unlikely that this lesion that has been present for 5 to 6 years would be causing her new fatigue and decreased energy over the last year. ? ?Regarding her urinary symptoms, I recommended  referral to GYN for further evaluation of prolapse and consideration of pessary.  Could also consider an OAB medication in the future ? ?-Referral placed to IR to discuss and consider biopsy and percutaneous ablation ?-Referral placed to GYN for suspected prolapse ?-RTC 3 months virtual visit symptom check ? ?I spent 65 total minutes on the day of the encounter including pre-visit review of the medical record, face-to-face time with the patient, and post visit ordering of labs/imaging/tests. ? ?Nickolas Madrid, MD ?04/18/2021 ? ?[place] ?[address] ?[place], [place] [address] ?([phone] ? ?

## 2021-04-19 ENCOUNTER — Inpatient Hospital Stay: Payer: Medicare Other

## 2021-04-19 ENCOUNTER — Inpatient Hospital Stay: Payer: Medicare Other | Attending: Oncology | Admitting: Licensed Clinical Social Worker

## 2021-04-19 ENCOUNTER — Encounter: Payer: Self-pay | Admitting: Licensed Clinical Social Worker

## 2021-04-19 DIAGNOSIS — N2889 Other specified disorders of kidney and ureter: Secondary | ICD-10-CM

## 2021-04-19 DIAGNOSIS — Z800 Family history of malignant neoplasm of digestive organs: Secondary | ICD-10-CM

## 2021-04-19 NOTE — Progress Notes (Signed)
REFERRING PROVIDER: ?Earlie Server, MD ?[place],  [place] [address] ? ?PRIMARY PROVIDER:  ?Marguerita Merles, MD ? ?PRIMARY REASON FOR VISIT:  ?1. Family history of colon cancer   ? ? ? ?HISTORY OF PRESENT ILLNESS:   ?Janet [name], a 71 y.o. female, was seen for a [facility] cancer genetics consultation at the request of Dr. Tasia Catchings due to a family history of cancer.  Janet [name] presents to clinic today to discuss the possibility of a hereditary predisposition to cancer, genetic testing, and to further clarify her future cancer risks, as well as potential cancer risks for family members.  ? ?Janet [name] is a 71 y.o. female with a left renal mass that has been stable over the last 5 to 6 years.  She has previously been on active surveillance for this.  ? ?CANCER HISTORY:  ?Oncology History  ? No history exists.  ? ? ? ?RISK FACTORS:  ?Menarche was at age 37.  ?First live birth at age 27.  ?Ovaries intact: not sure.  ?Hysterectomy: yes.  ?Menopausal status: postmenopausal.  ?HRT use: yes, few years. ?Colonoscopy: yes; 2021 ?Mammogram within the last year: yes. ?Number of breast biopsies: 0. ? ?Past Medical History:  ?Diagnosis Date  ? Asthma   ? Cancer [facility])   ? kidney  ? Clotting disorder ([place])   ? Hypertension   ? Hypertension   ? Kidney mass   ? Shortness of breath   ? Thyroid disease   ? Varicose vein of leg   ? with leg ulcer  ? ? ?Past Surgical History:  ?Procedure Laterality Date  ? ABDOMINAL HYSTERECTOMY    ? COLONOSCOPY WITH PROPOFOL N/A 05/25/2019  ? Procedure: COLONOSCOPY WITH PROPOFOL;  Surgeon: Lin Landsman, MD;  Location: [facility] ENDOSCOPY;  Service: Gastroenterology;  Laterality: N/A;  ? ? ?Social History  ? ?Socioeconomic History  ? Marital status: Married  ?  Spouse name: Not on file  ? Number of children: Not on file  ? Years of education: Not on file  ? Highest education level: Not on file  ?Occupational History  ? Not on file  ?Tobacco Use  ? Smoking status: Former  ?  Years: 5.00  ?  Types:  Cigarettes  ?  Quit date: 1995  ?  Years since quitting: 28.2  ?  Passive exposure: Past (Long time ago i or 2 cigarettes)  ? Smokeless tobacco: Never  ? Tobacco comments:  ?  1 cig daily--quit around 1995  ?Vaping Use  ? Vaping Use: Never used  ?Substance and Sexual Activity  ? Alcohol use: No  ? Drug use: No  ? Sexual activity: Not on file  ?Other Topics Concern  ? Not on file  ?Social History Narrative  ? Not on file  ? ?Social Determinants of Health  ? ?Financial Resource Strain: Low Risk   ? Difficulty of Paying Living Expenses: Not hard at all  ?Food Insecurity: No Food Insecurity  ? Worried About Charity fundraiser in the Last Year: Never true  ? Ran Out of Food in the Last Year: Never true  ?Transportation Needs: No Transportation Needs  ? Lack of Transportation (Medical): No  ? Lack of Transportation (Non-Medical): No  ?Physical Activity: Inactive  ? Days of Exercise per Week: 0 days  ? Minutes of Exercise per Session: 0 min  ?Stress: No Stress Concern Present  ? Feeling of Stress : Only a little  ?Social Connections: Socially Integrated  ? Frequency of  Communication with Friends and Family: Three times a week  ? Frequency of Social Gatherings with Friends and Family: Three times a week  ? Attends Religious Services: 1 to 4 times per year  ? Active Member of Clubs or Organizations: No  ? Attends Archivist Meetings: 1 to 4 times per year  ? Marital Status: Married  ?  ? ?FAMILY HISTORY:  ?We obtained a detailed, 4-generation family history.  Significant diagnoses are listed below: ?Family History  ?Problem Relation Age of Onset  ? Colon cancer Mother   ? Hypertension Mother   ? Heart attack Father 24  ? Hypertension Father   ? Hyperlipidemia Father   ? Diabetes Father   ? Heart attack Brother   ? Heart attack Brother   ? Heart attack Daughter   ? Heart Problems Son   ? Stroke Neg Hx   ? Breast cancer Neg Hx   ? ?Janet [name] had 2 sons, 1 daughter. One son passed at 57 in his sleep. Janet [name] has  20 siblings. She had 4 full brothers, 4 full sisters, 11 paternal half siblings. None have had cancer that she is aware of.  ? ?Janet [name]'s mother had colon cancer at 26 and died at 54. Patient had maternal aunts/uncles but has no information about them. No information about maternal grandparents. ? ?Janet [name]'s father died in his 53s. He had siblings, patient has no information about them or her paternal grandparents.  ? ?Janet [name] is unaware of previous family history of genetic testing for hereditary cancer risks. There is no reported Ashkenazi Jewish ancestry. There is no known consanguinity. ? ?GENETIC COUNSELING ASSESSMENT: Janet [name] is a 71 y.o. female with a family history of colon cancer which is somewhat suggestive of a hereditary cancer syndrome and predisposition to cancer. We, therefore, discussed and recommended the following at today's visit.  ? ?DISCUSSION: We discussed that approximately 10% of colorectal cancer is hereditary. Most cases of hereditary colorectal cancer are associated with Lynch syndrome genes. Kidney cancer may be associated with Lynch syndrome as well, especially if urothelial. There are other genes associated with hereditary cancer as well. Cancers and risks are gene specific.  We discussed that testing is beneficial for several reasons including  knowing about other cancer risks, identifying potential screening and risk-reduction options that may be appropriate, and to understand if other family members could be at risk for cancer and allow them to undergo genetic testing.  ? ?We reviewed the characteristics, features and inheritance patterns of hereditary cancer syndromes. We also discussed genetic testing, including the appropriate family members to test, the process of testing, insurance coverage and turn-around-time for results. We discussed the implications of a negative, positive and/or variant of uncertain significant result. We recommended Janet [name] pursue genetic  testing for the [facility] Common Hereditary Cancers+RNA gene panel.  ? ?The Common Hereditary Cancers Panel + RNA offered by [facility] includes sequencing and/or deletion duplication testing of the following 47 genes: APC, ATM, AXIN2, BARD1, BMPR1A, BRCA1, BRCA2, BRIP1, CDH1, CDKN2A (p14ARF), CDKN2A (p16INK4a), CKD4, CHEK2, CTNNA1, DICER1, EPCAM (Deletion/duplication testing only), GREM1 (promoter region deletion/duplication testing only), KIT, MEN1, MLH1, MSH2, MSH3, MSH6, MUTYH, NBN, NF1, NHTL1, PALB2, PDGFRA, PMS2, POLD1, POLE, PTEN, RAD50, RAD51C, RAD51D, SDHB, SDHC, SDHD, SMAD4, SMARCA4. STK11, TP53, TSC1, TSC2, and VHL.  The following genes were evaluated for sequence changes only: SDHA and HOXB13 c.251G>A variant only. ? ?Based on Janet [name]'s family history of cancer, she does not strictly meet  criteria for testing and may have an out of pocket cost.  ? ?PLAN: After considering the risks, benefits, and limitations, Janet [name] provided informed consent to pursue genetic testing and the blood sample was sent to [facility] for analysis of the Common Hereditary Cancers+RNA panel. Results should be available within approximately 2-3 weeks' time, at which point they will be disclosed by telephone to Janet [name], as will any additional recommendations warranted by these results. Janet [name] will receive a summary of her genetic counseling visit and a copy of her results once available. This information will also be available in Epic.  ? ?Janet [name]'s questions were answered to her satisfaction today. Our contact information was provided should additional questions or concerns arise. Thank you for the referral and allowing [place] to share in the care of your patient.  ? ?Faith Rogue, MS, LCGC ?Genetic Counselor ?[email or web address] ?Phone: [phone] ? ?The patient was seen for a total of 25 minutes in face-to-face genetic counseling.  Dr. Grayland Ormond was available for discussion regarding this  case.  ? ?_______________________________________________________________________ ?For Office Staff:  ?Number of people involved in session: 1 ?Was an Intern/ student involved with case: no ? ?

## 2021-05-05 ENCOUNTER — Telehealth: Payer: Self-pay | Admitting: Licensed Clinical Social Worker

## 2021-05-08 ENCOUNTER — Encounter: Payer: Self-pay | Admitting: Licensed Clinical Social Worker

## 2021-05-08 ENCOUNTER — Ambulatory Visit: Payer: Self-pay | Admitting: Licensed Clinical Social Worker

## 2021-05-08 DIAGNOSIS — Z1379 Encounter for other screening for genetic and chromosomal anomalies: Secondary | ICD-10-CM | POA: Insufficient documentation

## 2021-05-08 NOTE — Progress Notes (Signed)
HPI:  Ms. Hourihan was previously seen in the [facility] clinic due to a personal and family history of cancer and concerns regarding a hereditary predisposition to cancer. Please refer to our prior cancer genetics clinic note for more information regarding our discussion, assessment and recommendations, at the time. Ms. Lobb recent genetic test results were disclosed to her, as were recommendations warranted by these results. These results and recommendations are discussed in more detail below. ? ?CANCER HISTORY:  ?Oncology History  ? No history exists.  ? ? ?FAMILY HISTORY:  ?We obtained a detailed, 4-generation family history.  Significant diagnoses are listed below: ?Family History  ?Problem Relation Age of Onset  ? Colon cancer Mother 64  ? Hypertension Mother   ? Heart attack Father 58  ? Hypertension Father   ? Hyperlipidemia Father   ? Diabetes Father   ? Heart attack Brother   ? Heart attack Brother   ? Heart attack Daughter   ? Heart Problems Son   ? Stroke Neg Hx   ? Breast cancer Neg Hx   ? ?Ms. Yogi had 2 sons, 1 daughter. One son passed at 63 in his sleep. Ms. Blacksher has 20 siblings. She had 4 full brothers, 4 full sisters, 11 paternal half siblings. None have had cancer that she is aware of.  ?  ?Ms. Janek's mother had colon cancer at 37 and died at 53. Patient had maternal aunts/uncles but has no information about them. No information about maternal grandparents. ?  ?Ms. Burnstein's father died in his 50s. He had siblings, patient has no information about them or her paternal grandparents.  ?  ?Ms. Myhre is unaware of previous family history of genetic testing for hereditary cancer risks. There is no reported Ashkenazi Jewish ancestry. There is no known consanguinity. ? ?GENETIC TEST RESULTS: Genetic testing reported out on 05/04/2021 through the [facility] Common Hereditary cancer panel found no pathogenic mutations.  ? ?The Common Hereditary Cancers Panel + RNA offered by [facility]  includes sequencing and/or deletion duplication testing of the following 47 genes: APC, ATM, AXIN2, BARD1, BMPR1A, BRCA1, BRCA2, BRIP1, CDH1, CDKN2A (p14ARF), CDKN2A (p16INK4a), CKD4, CHEK2, CTNNA1, DICER1, EPCAM (Deletion/duplication testing only), GREM1 (promoter region deletion/duplication testing only), KIT, MEN1, MLH1, MSH2, MSH3, MSH6, MUTYH, NBN, NF1, NHTL1, PALB2, PDGFRA, PMS2, POLD1, POLE, PTEN, RAD50, RAD51C, RAD51D, SDHB, SDHC, SDHD, SMAD4, SMARCA4. STK11, TP53, TSC1, TSC2, and VHL.  The following genes were evaluated for sequence changes only: SDHA and HOXB13 c.251G>A variant only.  ? ?The test report has been scanned into EPIC and is located under the Molecular Pathology section of the Results Review tab.  A portion of the result report is included below for reference.  ? ? ? ?We discussed that because current genetic testing is not perfect, it is possible there may be a gene mutation in one of these genes that current testing cannot detect, but that chance is small.  There could be another gene that has not yet been discovered, or that we have not yet tested, that is responsible for the cancer diagnoses in the family. It is also possible there is a hereditary cause for the cancer in the family that Ms. Bohne did not inherit and therefore was not identified in her testing.  Therefore, it is important to remain in touch with cancer genetics in the future so that we can continue to offer Ms. Cammarano the most up to date genetic testing.  ? ?Genetic testing did identify a variant of  uncertain significance (VUS) in the BRIP1 gene called c.2553T>G.  At this time, it is unknown if this variant is associated with increased cancer risk or if this is a normal finding, but most variants such as this get reclassified to being inconsequential. It should not be used to make medical management decisions. With time, we suspect the lab will determine the significance of this variant, if any. If we do learn more about it  we will try to contact Ms. Kirschenbaum to discuss it further. However, it is important to stay in touch with [place] periodically and keep the address and phone number up to date.  ? ?ADDITIONAL GENETIC TESTING:We discussed with Ms. Villacres that her genetic testing was fairly extensive.  If there are genes identified to increase cancer risk that can be analyzed in the future, we would be happy to discuss and coordinate this testing at that time.   ? ?CANCER SCREENING RECOMMENDATIONS: Ms. Tregoning test result is considered negative (normal).  This means that we have not identified a hereditary cause for her  personal and family history of cancer at this time. Most cancers happen by chance and this negative test suggests that her cancer may fall into this category.   ? ?While reassuring, this does not definitively rule out a hereditary predisposition to cancer. It is still possible that there could be genetic mutations that are undetectable by current technology. There could be genetic mutations in genes that have not been tested or identified to increase cancer risk.  Therefore, it is recommended she continue to follow the cancer management and screening guidelines provided by her oncology and primary healthcare provider.  ? ?An individual's cancer risk and medical management are not determined by genetic test results alone. Overall cancer risk assessment incorporates additional factors, including personal medical history, family history, and any available genetic information that may result in a personalized plan for cancer prevention and surveillance. ? ?RECOMMENDATIONS FOR FAMILY MEMBERS:  Relatives in this family might be at some increased risk of developing cancer, over the general population risk, simply due to the family history of cancer.  We recommended female relatives in this family have a yearly mammogram beginning at age 38, or 32 years younger than the earliest onset of cancer, an annual clinical breast exam, and  perform monthly breast self-exams. Female relatives in this family should also have a gynecological exam as recommended by their primary provider.  All family members should be referred for colonoscopy starting at age 4.  ? ?FOLLOW-UP: Lastly, we discussed with Ms. Balash that cancer genetics is a rapidly advancing field and it is possible that new genetic tests will be appropriate for her and/or her family members in the future. We encouraged her to remain in contact with cancer genetics on an annual basis so we can update her personal and family histories and let her know of advances in cancer genetics that may benefit this family.  ? ?Our contact number was provided. Ms. Negrette questions were answered to her satisfaction, and she knows she is welcome to call us at anytime with additional questions or concerns.  ? ?Faith Rogue, MS, LCGC ?Genetic Counselor ?[email or web address] ?Phone: [phone] ? ?

## 2021-05-08 NOTE — Telephone Encounter (Signed)
Revealed negative genetic testing.  Revealed that a VUS in BRIP1 was identified. This normal result is reassuring and indicates that it is unlikely Ms. Stahly's cancer is due to a hereditary cause.  It is unlikely that there is an increased risk of another cancer due to a mutation in one of these genes.  However, genetic testing is not perfect, and cannot definitively rule out a hereditary cause.  It will be important for her to keep in contact with genetics to learn if any additional testing may be needed in the future.    ? ?

## 2021-06-23 ENCOUNTER — Telehealth: Payer: Self-pay

## 2021-06-23 NOTE — Telephone Encounter (Signed)
-----   Message from Earlie Server, MD sent at 06/22/2021  8:22 PM EDT ----- Please schedule her to follow up in September. MD only. Thanks.

## 2021-06-26 ENCOUNTER — Telehealth: Payer: Self-pay

## 2021-06-26 NOTE — Telephone Encounter (Signed)
-----   Message from Earlie Server, MD sent at 06/22/2021  8:22 PM EDT ----- Please schedule her to follow up in September. MD only. Thanks.

## 2021-06-26 NOTE — Telephone Encounter (Signed)
Please schedule patient for follow up in September

## 2021-08-21 ENCOUNTER — Encounter (INDEPENDENT_AMBULATORY_CARE_PROVIDER_SITE_OTHER): Payer: Self-pay | Admitting: Nurse Practitioner

## 2021-08-21 ENCOUNTER — Ambulatory Visit
Admission: EM | Admit: 2021-08-21 | Discharge: 2021-08-21 | Disposition: A | Discharge status: Home / Self Care | Payer: Medicare Other | Attending: Nurse Practitioner | Admitting: Nurse Practitioner

## 2021-08-21 ENCOUNTER — Ambulatory Visit (INDEPENDENT_AMBULATORY_CARE_PROVIDER_SITE_OTHER): Payer: Medicare Other | Admitting: Nurse Practitioner

## 2021-08-21 VITALS — BP 146/70 | HR 77 | Resp 17 | Ht 69.0 in | Wt 209.8 lb

## 2021-08-21 DIAGNOSIS — L97909 Non-pressure chronic ulcer of unspecified part of unspecified lower leg with unspecified severity: Secondary | ICD-10-CM

## 2021-08-21 DIAGNOSIS — R200 Anesthesia of skin: Secondary | ICD-10-CM

## 2021-08-21 DIAGNOSIS — R070 Pain in throat: Secondary | ICD-10-CM | POA: Insufficient documentation

## 2021-08-21 DIAGNOSIS — I83009 Varicose veins of unspecified lower extremity with ulcer of unspecified site: Secondary | ICD-10-CM | POA: Diagnosis not present

## 2021-08-21 DIAGNOSIS — R202 Paresthesia of skin: Secondary | ICD-10-CM

## 2021-08-21 DIAGNOSIS — I10 Essential (primary) hypertension: Secondary | ICD-10-CM

## 2021-08-21 LAB — GROUP A STREP BY PCR: Group A Strep by PCR: NOT DETECTED

## 2021-08-21 MED ORDER — LIDOCAINE VISCOUS HCL 2 % MT SOLN: 15.0000 mL | OROMUCOSAL | 0 refills | Status: DC | PRN

## 2021-08-21 MED ORDER — DOXYCYCLINE HYCLATE 100 MG PO CAPS: 100.0000 mg | ORAL_CAPSULE | Freq: Two times a day (BID) | ORAL | 0 refills | Status: DC

## 2021-08-21 MED ORDER — LIDOCAINE VISCOUS HCL 2 % MT SOLN
15.0000 mL | OROMUCOSAL | 0 refills | Status: DC | PRN
Start: 2021-08-21 — End: 2021-08-23

## 2021-08-21 MED ORDER — TRAMADOL HCL 50 MG PO TABS
50.0000 mg | ORAL_TABLET | Freq: Four times a day (QID) | ORAL | 0 refills | Status: DC | PRN
Start: 2021-08-21 — End: 2021-12-21

## 2021-08-21 NOTE — ED Triage Notes (Signed)
Pt here with C/O swollen throat when swallowing. Sore to touch on side of neck. For 2 days. No Fever. Pt states it feels like she is swallowing "a ball"

## 2021-08-21 NOTE — ED Provider Notes (Signed)
[facility]    CSN: [id number] Arrival date & time: 08/21/21  1930      History   Chief Complaint Chief Complaint  Patient presents with   Throat Pain    HPI Janet [name] is a 71 y.o. female.   HPI  She isi in today complaining of right sided throat pain. She reports the pain being worse with swallowing. Denies fever, chills, headache, dizziness, visual changes, shortness of breath, dyspnea on exertion, chest pain, nausea, vomiting,. She denies any other exposures.  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   Left hip pain 11/01/2017   Headache disorder 08/29/2017   Premature ventricular contractions 05/25/2016   Asthma in adult without complication 05/39/7673   Heart palpitations 04/23/2016   History of kidney cancer 04/23/2016   Bilateral cellulitis of lower leg 10/03/2015   Renal mass, left 02/24/2015   Thrombophlebitis 09/29/2014   Chest pressure 09/12/2014   Dyspnea on exertion 09/12/2014   Generalized weakness 09/09/2014   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 Authorizing Provider  albuterol (PROVENTIL HFA;VENTOLIN HFA) 108 (90 BASE) MCG/ACT inhaler Inhale 2 puffs into the lungs every 6 (six) hours as needed for wheezing or shortness of breath. 12/24/14  Yes [name], MD  aspirin EC 81 MG tablet Take 81 mg by mouth daily.   Yes [provider]  Butalbital-APAP-Caffeine 50-300-40 MG CAPS take 1 to 2 capsules by mouth every 6 hours if needed for headache 07/30/17  Yes [provider]  Elastic Bandages & Supports (EMS ANTI-EMBOLISM STOCKINGS) MISC Apply in the morning and remove at night. 06/23/14  Yes [provider]  lidocaine (XYLOCAINE) 2 % solution Use as directed 15 mLs in the mouth or throat every 3 (three) hours as needed for mouth pain. 08/21/21  Yes Stephenson Cichy, Diona Foley, NP  lidocaine (XYLOCAINE) 2 % solution Use as directed 15 mLs in the mouth or throat as needed for mouth pain. 08/21/21  Yes Vevelyn Francois, NP  lisinopril (ZESTRIL) 20 MG tablet 2 tablets (40 mg total) daily. 10/19/13  Yes [provider]  meclizine (ANTIVERT) 25 MG tablet Take by mouth.   Yes [provider]  naproxen (NAPROSYN) 500 MG tablet Take by mouth. 05/15/16  Yes [provider]  potassium chloride SA (K-DUR,KLOR-CON) 20 MEQ tablet Take 20 mEq by mouth daily.    Yes [provider]  traMADol (ULTRAM) 50 MG tablet Take 1 tablet (50 mg total) by mouth every 6 (six) hours as needed. 08/21/21  Yes Kris Hartmann, NP  linaclotide Rolan Lipa) 145 MCG CAPS capsule Take 1 capsule (145 mcg total) by mouth daily before breakfast. 09/30/19 05/30/20  Lin Landsman, MD    Family History Family History  Problem Relation Age of Onset   Colon cancer Mother 62   Hypertension Mother    Heart attack Father 35   Hypertension Father    Hyperlipidemia Father    Diabetes Father    Heart attack Brother    Heart attack Brother     Heart attack Daughter    Heart Problems Son    Stroke Neg Hx    Breast cancer Neg Hx     Social History Social History   Tobacco Use   Smoking status: Former    Years: 5.00    Types: Cigarettes    Quit date: 1995    Years since quitting: 28.6    Passive exposure: Past (Long time ago i or 2 cigarettes)   Smokeless tobacco: Never   Tobacco comments:    1 cig daily--quit around 1995  Vaping Use   Vaping Use: Never used  Substance Use Topics   Alcohol use: No   Drug use: No     Allergies   Amlodipine, Shellfish allergy, Thiazide-type diuretics, and Penicillins   Review of Systems Review of Systems   Physical Exam Triage Vital Signs ED Triage Vitals  Enc Vitals Group     BP 08/21/21 1957 (!) 146/90     Pulse Rate 08/21/21 1957 93     Resp 08/21/21 1957 18     Temp 08/21/21 1957 98.5 F (36.9 C)     Temp src --      SpO2 08/21/21 1957 99 %     Weight 08/21/21 1956 210 lb 7.4 oz (95.5 kg)     Height 08/21/21 1956 '5\' 6"'$  (1.676 m)     Head Circumference --      Peak Flow --      Pain Score 08/21/21 1956 0     Pain Loc --      Pain Edu? --      Excl. in [place]? --    No data found.  Updated Vital Signs BP (!) 146/90 (BP Location: Left Arm)   Pulse 93   Temp 98.5 F (36.9 C)   Resp 18   Ht '5\' 6"'$  (1.676 m)   Wt 210 lb 7.4 oz (95.5 kg)   SpO2 99%   BMI 33.97 kg/m   Visual Acuity Right Eye Distance:   Left Eye Distance:   Bilateral Distance:    Right Eye Near:   Left Eye Near:    Bilateral Near:     Physical Exam Constitutional:      Appearance: She is obese.  HENT:     Head: Normocephalic and atraumatic.     Nose: Nose normal.     Mouth/Throat:     Mouth: Mucous membranes are moist.     Pharynx: Posterior oropharyngeal erythema present.  Cardiovascular:     Rate and Rhythm: Normal  rate and regular rhythm.     Pulses: Normal pulses.  Pulmonary:     Effort: Pulmonary effort is normal.  Musculoskeletal:        General: Normal range of  motion.  Skin:    General: Skin is warm.     Capillary Refill: Capillary refill takes less than 2 seconds.  Neurological:     General: No focal deficit present.     Mental Status: She is alert and oriented to person, place, and time.  Psychiatric:        Mood and Affect: Mood normal.        Behavior: Behavior normal.      UC Treatments / Results  Labs (all labs ordered are listed, but only abnormal results are displayed) Labs Reviewed  GROUP A STREP BY PCR    EKG   Radiology No results found.  Procedures Procedures (including critical care time)  Medications Ordered in UC Medications - No data to display  Initial Impression / Assessment and Plan / UC Course  I have reviewed the triage vital signs and the nursing notes.  Pertinent labs & imaging results that were available during my care of the patient were reviewed by me and considered in my medical decision making (see chart for details).     Throat pain  Final Clinical Impressions(s) / UC Diagnoses   Final diagnoses:  Throat pain in adult     Discharge Instructions      Your Strep Test is negative. Lidocaine 15 ml swish and sips three times a day. We encourage conservative treatment with symptom relief. We also encourage salt water gargles for your sore throat. You should also consider throat lozenges and chloraseptic spray.         ED Prescriptions     Medication Sig Dispense Auth. Provider   lidocaine (XYLOCAINE) 2 % solution Use as directed 15 mLs in the mouth or throat every 3 (three) hours as needed for mouth pain. 100 mL Vevelyn Francois, NP   lidocaine (XYLOCAINE) 2 % solution Use as directed 15 mLs in the mouth or throat as needed for mouth pain. 100 mL Vevelyn Francois, NP      PDMP not reviewed this encounter.   Dionisio David [place], [place] 08/23/21 1319

## 2021-08-21 NOTE — Discharge Instructions (Addendum)
Your Strep Test is negative. Lidocaine 15 ml swish and sips three times a day. We encourage conservative treatment with symptom relief. We also encourage salt water gargles for your sore throat. You should also consider throat lozenges and chloraseptic spray.

## 2021-08-22 ENCOUNTER — Telehealth: Payer: Medicare Other | Admitting: Urology

## 2021-08-22 ENCOUNTER — Encounter (INDEPENDENT_AMBULATORY_CARE_PROVIDER_SITE_OTHER): Payer: Self-pay | Admitting: Nurse Practitioner

## 2021-08-22 ENCOUNTER — Other Ambulatory Visit: Payer: Self-pay

## 2021-08-22 DIAGNOSIS — N2889 Other specified disorders of kidney and ureter: Secondary | ICD-10-CM

## 2021-08-22 NOTE — Progress Notes (Signed)
Subjective:    Patient ID: Janet [name], female    DOB: 10/29/1950, 72 y.o.   MRN: [id number] No chief complaint on file.   Janet [name] is a 71 year old female who presents today for follow-up evaluation of her lower extremities with a venous ulcer.  The patient has dealt with venous ulcerations and lower extremity edema for years.  Unfortunately the patient has been noncompliant.  She does not wear medical grade compression she does not elevate her lower extremities.  Previously she was seen by wound care to treat and heal her most recent lower extremity wound.  She notes that the wound burns and stings.  She is also concerned that there may be an infection.  She notes that she continues to have swelling of her lower extremities.    Review of Systems  Cardiovascular:  Positive for leg swelling.  Skin:  Positive for wound.  All other systems reviewed and are negative.      Objective:   Physical Exam Vitals reviewed.  HENT:     Head: Normocephalic.  Cardiovascular:     Rate and Rhythm: Normal rate.  Pulmonary:     Effort: Pulmonary effort is normal.  Skin:    General: Skin is warm and dry.  Neurological:     Mental Status: She is alert and oriented to person, place, and time.  Psychiatric:        Mood and Affect: Mood normal.        Behavior: Behavior normal.        Thought Content: Thought content normal.     BP (!) 146/70 (BP Location: Right Arm)   Pulse 77   Resp 17   Ht '5\' 9"'$  (1.753 m)   Wt 209 lb 12.8 oz (95.2 kg)   BMI 30.98 kg/m   Past Medical History:  Diagnosis Date   Asthma    Cancer ([facility])    kidney   Clotting disorder ([facility])    Hypertension    Hypertension    Kidney mass    Shortness of breath    Thyroid disease    Varicose vein of leg    with leg ulcer    Social History   Socioeconomic History   Marital status: Married    Spouse name: Not on file   Number of children: Not on file   Years of education: Not on file   Highest education level:  Not on file  Occupational History   Not on file  Tobacco Use   Smoking status: Former    Years: 5.00    Types: Cigarettes    Quit date: 1995    Years since quitting: 28.6    Passive exposure: Past (Long time ago i or 2 cigarettes)   Smokeless tobacco: Never   Tobacco comments:    1 cig daily--quit around 1995  Vaping Use   Vaping Use: Never used  Substance and Sexual Activity   Alcohol use: No   Drug use: No   Sexual activity: Not on file  Other Topics Concern   Not on file  Social History Narrative   Not on file   Social Determinants of Health   Financial Resource Strain: Low Risk  (04/10/2021)   Overall Financial Resource Strain (CARDIA)    Difficulty of Paying Living Expenses: Not hard at all  Food Insecurity: No Food Insecurity (04/10/2021)   Hunger Vital Sign    Worried About Running Out of Food in the Last Year: Never true  Ran Out of Food in the Last Year: Never true  Transportation Needs: No Transportation Needs (04/10/2021)   PRAPARE - Hydrologist (Medical): No    Lack of Transportation (Non-Medical): No  Physical Activity: Inactive (04/10/2021)   Exercise Vital Sign    Days of Exercise per Week: 0 days    Minutes of Exercise per Session: 0 min  Stress: No Stress Concern Present (04/10/2021)   [place]    Feeling of Stress : Only a little  Social Connections: Socially Integrated (04/10/2021)   Social Connection and Isolation Panel [NHANES]    Frequency of Communication with Friends and Family: Three times a week    Frequency of Social Gatherings with Friends and Family: Three times a week    Attends Religious Services: 1 to 4 times per year    Active Member of Clubs or Organizations: No    Attends Archivist Meetings: 1 to 4 times per year    Marital Status: Married  Human resources officer Violence: Not At Risk (04/10/2021)   Humiliation, Afraid, Rape, and Kick  questionnaire    Fear of Current or Ex-Partner: No    Emotionally Abused: No    Physically Abused: No    Sexually Abused: No    Past Surgical History:  Procedure Laterality Date   ABDOMINAL HYSTERECTOMY     COLONOSCOPY WITH PROPOFOL N/A 05/25/2019   Procedure: COLONOSCOPY WITH PROPOFOL;  Surgeon: Lin Landsman, MD;  Location: [facility] ENDOSCOPY;  Service: Gastroenterology;  Laterality: N/A;    Family History  Problem Relation Age of Onset   Colon cancer Mother 33   Hypertension Mother    Heart attack Father 11   Hypertension Father    Hyperlipidemia Father    Diabetes Father    Heart attack Brother    Heart attack Brother    Heart attack Daughter    Heart Problems Son    Stroke Neg Hx    Breast cancer Neg Hx     Allergies  Allergen Reactions   Amlodipine Swelling    Other reaction(s): Angioedema   Shellfish Allergy Anaphylaxis   Thiazide-Type Diuretics Other (See Comments)    Other reaction(s): Other (See Comments)   Penicillins Hives and Other (See Comments)    Has patient had a PCN reaction causing immediate rash, facial/tongue/throat swelling, SOB or lightheadedness with hypotension: no Has patient had a PCN reaction causing severe rash involving mucus membranes or skin necrosis: no Has patient had a PCN reaction that required hospitalization? no Has patient had a PCN reaction occurring within the last 10 years: no If all of the above answers are "NO", then may proceed with Cephalosporin use.        Latest Ref Rng & Units 03/13/2021   11:54 AM 01/10/2021    7:01 PM 05/08/2019    8:37 PM  CBC  WBC 4.0 - 10.5 K/uL 6.6  7.5  7.0   Hemoglobin 12.0 - 15.0 g/dL 12.9  13.0  12.5   Hematocrit 36.0 - 46.0 % 40.3  41.9  39.1   Platelets 150 - 400 K/uL 297  284  307       CMP     Component Value Date/Time   NA 136 03/13/2021 1154   NA 141 04/12/2014 1225   K 4.0 03/13/2021 1154   K 3.4 (L) 04/12/2014 1225   CL 102 03/13/2021 1154   CL 109 04/12/2014 1225  CO2 28 03/13/2021 1154   CO2 25 04/12/2014 1225   GLUCOSE 95 03/13/2021 1154   GLUCOSE 95 04/12/2014 1225   BUN 18 03/13/2021 1154   BUN 17 04/12/2014 1225   CREATININE 0.87 03/13/2021 1154   CREATININE 0.64 04/12/2014 1225   CALCIUM 9.6 03/13/2021 1154   CALCIUM 9.2 04/12/2014 1225   PROT 7.8 03/13/2021 1154   PROT 8.2 03/11/2012 1133   ALBUMIN 3.8 03/13/2021 1154   ALBUMIN 3.6 03/11/2012 1133   AST 16 03/13/2021 1154   AST 19 03/11/2012 1133   ALT 13 03/13/2021 1154   ALT 16 03/11/2012 1133   ALKPHOS 47 03/13/2021 1154   ALKPHOS 62 03/11/2012 1133   BILITOT 0.3 03/13/2021 1154   BILITOT 0.5 03/11/2012 1133   GFRNONAA >60 03/13/2021 1154   GFRNONAA >60 04/12/2014 1225   GFRAA >60 05/08/2019 2037   GFRAA >60 04/12/2014 1225     No results found.     Assessment & Plan:   1. Venous ulcer ([place]) The patient is notoriously noncompliant in regards to compression and conservative therapy.  The patient has a venous ulcer today and she has been placing the ointment given to her by wound care on the wound.  The patient was advised that Unna boots would be in her best interest to help with healing of the wound but she does not wish to have those placed today.  She is advised that she can continue using ointment given her by wound care but that she will also be a need to use medical grade compression to help with the swelling which will help with her wound healing.  The patient is also advised that we can place referral back to wound care for further evaluation and follow-up.  Will also prescribe antibiotics today.  Will have her return in 2 weeks to evaluate progress with wound.  We should also have the patient undergo a updated venous reflux study as her reflux may have worsened as it was previously very mild. 2. Numbness and tingling of left lower extremity We have done ABIs with noted evidence of mild venous insufficiency.  The patient has numbness and tingling and burning and stinging  of her lower extremities.  While the burning and stinging may be associated with some venous disease typically numbness and tingling or not.  I suspect that the patient has a component of neuropathy.  We have discussed this with patient on multiple occasions over the years.  We have previously placed referral to neurology that she did not follow-up with.  Will place a referral to neurology once again. - Ambulatory referral to Neurology  3. Essential hypertension Continue antihypertensive medications as already ordered, these medications have been reviewed and there are no changes at this time.    Current Outpatient Medications on File Prior to Visit  Medication Sig Dispense Refill   albuterol (PROVENTIL HFA;VENTOLIN HFA) 108 (90 BASE) MCG/ACT inhaler Inhale 2 puffs into the lungs every 6 (six) hours as needed for wheezing or shortness of breath. 1 Inhaler 0   aspirin EC 81 MG tablet Take 81 mg by mouth daily.     Butalbital-APAP-Caffeine 50-300-40 MG CAPS take 1 to 2 capsules by mouth every 6 hours if needed for headache     Elastic Bandages & Supports (EMS ANTI-EMBOLISM STOCKINGS) MISC Apply in the morning and remove at night.     lisinopril (ZESTRIL) 20 MG tablet 2 tablets (40 mg total) daily.     meclizine (ANTIVERT) 25 MG  tablet Take by mouth.     naproxen (NAPROSYN) 500 MG tablet Take by mouth.     potassium chloride SA (K-DUR,KLOR-CON) 20 MEQ tablet Take 20 mEq by mouth daily.      [DISCONTINUED] linaclotide (LINZESS) 145 MCG CAPS capsule Take 1 capsule (145 mcg total) by mouth daily before breakfast. 30 capsule 5   No current facility-administered medications on file prior to visit.    There are no Patient Instructions on file for this visit. No follow-ups on file.   Kris Hartmann, NP

## 2021-08-23 ENCOUNTER — Encounter: Payer: Self-pay | Admitting: Emergency Medicine

## 2021-08-23 ENCOUNTER — Ambulatory Visit
Admission: EM | Admit: 2021-08-23 | Discharge: 2021-08-23 | Disposition: A | Discharge status: Home / Self Care | Payer: Medicare Other | Attending: Family Medicine | Admitting: Family Medicine

## 2021-08-23 DIAGNOSIS — J029 Acute pharyngitis, unspecified: Secondary | ICD-10-CM | POA: Diagnosis not present

## 2021-08-23 MED ORDER — LIDOCAINE VISCOUS HCL 2 % MT SOLN: 15.0000 mL | OROMUCOSAL | 0 refills | Status: AC | PRN

## 2021-08-23 MED ORDER — ALUM & MAG HYDROXIDE-SIMETH 200-200-20 MG/5ML PO SUSP
30.0000 mL | Freq: Once | ORAL | Status: AC
  Administered 2021-08-23: 30 mL via ORAL

## 2021-08-23 MED ORDER — LIDOCAINE VISCOUS HCL 2 % MT SOLN
15.0000 mL | Freq: Once | OROMUCOSAL | Status: AC
  Administered 2021-08-23: 15 mL via ORAL

## 2021-08-23 MED ORDER — FAMOTIDINE 20 MG PO TABS: 20.0000 mg | ORAL_TABLET | Freq: Two times a day (BID) | ORAL | 0 refills | Status: AC

## 2021-08-23 NOTE — Discharge Instructions (Addendum)
Stop by the pharmacy to pick up your prescriptions.  Swish and then swallow the lidocaine solution every 3-4 hours as needed.  Take the Pepcid/famotidine every 12 hours for the next 1 to 2 weeks.  Follow up with your primary care provider as needed.

## 2021-08-23 NOTE — ED Triage Notes (Signed)
Pt was seen 8/7 for ST and it is not better.

## 2021-08-23 NOTE — ED Provider Notes (Signed)
[facility]    CSN: [id number] Arrival date & time: 08/23/21  1646      History   Chief Complaint Chief Complaint  Patient presents with   Sore Throat    HPI Janet [name] is a 71 y.o. female.   HPI   Here for worsening sore throat that started 5-6 days ago.  "Feels like its swollen when she swallows." Has been gargling warm saltly water and using cough drops without no improvement.  Seen at the urgent care 2 days ago for same symptoms.  She has not eaten any chips or fish with bones.  She does not recall anything scratching her throat.   Fever : no  Chills: no Sore throat: yes  Cough: no Sputum: no Nasal congestion : no  Rhinorrhea: no Myalgias: no Appetite: decreased  Hydration: normal  Abdominal pain: no Nausea: no Vomiting: no Diarrhea: no  Sleep disturbance: no  Headache: no      Past Medical History:  Diagnosis Date   Asthma    Cancer ([place])    kidney   Clotting disorder ([place])    Hypertension    Hypertension    Kidney mass    Shortness of breath    Thyroid disease    Varicose vein of leg    with leg ulcer    Patient Active Problem List   Diagnosis Date Noted   Genetic testing 05/08/2021   Numbness and tingling of left lower extremity 06/23/2018   Essential hypertension 05/02/2018   Swelling of limb 05/02/2018   Varicose veins with ulcer, left ([place]) 05/02/2018   Left hip pain 11/01/2017   Headache disorder 08/29/2017   Premature ventricular contractions 05/25/2016   Asthma in adult without complication 36/64/4034   Heart palpitations 04/23/2016   History of kidney cancer 04/23/2016   Bilateral cellulitis of lower leg 10/03/2015   Renal mass, left 02/24/2015   Thrombophlebitis 09/29/2014   Chest pressure 09/12/2014   Dyspnea on exertion 09/12/2014   Generalized weakness 09/09/2014   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 Authorizing Provider  famotidine (PEPCID) 20 MG tablet Take 1 tablet (20 mg total) by mouth 2 (two) times daily. 08/23/21  Yes Amaia Lavallie, DO  albuterol (PROVENTIL HFA;VENTOLIN HFA) 108 (90 BASE) MCG/ACT inhaler Inhale 2 puffs into the lungs every 6 (six) hours as needed for wheezing or shortness of breath. 12/24/14   [name], MD  aspirin EC 81 MG tablet Take 81 mg by mouth daily.    [provider]  Butalbital-APAP-Caffeine 50-300-40 MG CAPS take 1 to 2 capsules by mouth every 6 hours if needed for headache 07/30/17  [provider]  Elastic Bandages & Supports (EMS ANTI-EMBOLISM STOCKINGS) MISC Apply in the morning and remove at night. 06/23/14   [provider]  lidocaine (XYLOCAINE) 2 % solution Use as directed 15 mLs in the mouth or throat every 3 (three) hours as needed for mouth pain. 08/23/21   Reon Hunley, [name], DO  lisinopril (ZESTRIL) 20 MG tablet 2 tablets (40 mg total) daily. 10/19/13   [provider]  meclizine (ANTIVERT) 25 MG tablet Take by mouth.    [provider]  naproxen (NAPROSYN) 500 MG tablet Take by mouth. 05/15/16   [provider]  potassium chloride SA (K-DUR,KLOR-CON) 20 MEQ tablet Take 20 mEq by mouth daily.     [provider]  traMADol (ULTRAM) 50 MG tablet Take 1 tablet (50 mg total) by mouth every 6 (six) hours as needed. 08/21/21   Kris Hartmann, NP  linaclotide Rolan Lipa) 145 MCG CAPS capsule Take 1 capsule (145 mcg total) by mouth daily before breakfast.  09/30/19 05/30/20  Lin Landsman, MD    Family History Family History  Problem Relation Age of Onset   Colon cancer Mother 65   Hypertension Mother    Heart attack Father 53   Hypertension Father    Hyperlipidemia Father    Diabetes Father    Heart attack Brother    Heart attack Brother    Heart attack Daughter    Heart Problems Son    Stroke Neg Hx    Breast cancer Neg Hx     Social History Social History   Tobacco Use   Smoking status: Former    Years: 5.00    Types: Cigarettes    Quit date: 1995    Years since quitting: 28.6    Passive exposure: Past (Long time ago i or 2 cigarettes)   Smokeless tobacco: Never   Tobacco comments:    1 cig daily--quit around 1995  Vaping Use   Vaping Use: Never used  Substance Use Topics   Alcohol use: No   Drug use: No     Allergies   Amlodipine, Shellfish allergy, Thiazide-type diuretics, and Penicillins   Review of Systems Review of Systems: :negative unless otherwise stated in HPI.      Physical Exam Triage Vital Signs ED Triage Vitals  Enc Vitals Group     BP 08/23/21 1655 (!) 146/69     Pulse Rate 08/23/21 1655 83     Resp 08/23/21 1655 16     Temp 08/23/21 1654 98.5 F (36.9 C)     Temp Source 08/23/21 1654 Oral     SpO2 08/23/21 1655 99 %     Weight --      Height --      Head Circumference --      Peak Flow --      Pain Score 08/23/21 1653 8     Pain Loc --      Pain Edu? --      Excl. in [place]? --    No data found.  Updated Vital Signs BP (!) 146/69 (BP Location: Left Arm)   Pulse 83   Temp 98.5 F (36.9 C) (Oral)   Resp 16   SpO2 99%   Visual Acuity Right Eye Distance:   Left Eye Distance:   Bilateral Distance:    Right Eye Near:   Left Eye Near:    Bilateral Near:     Physical Exam GEN:     alert, non-toxic appearing female  in no distress  HENT:  mucus membranes moist, oropharyngeal without lesions or exudate, no tonsillar hypertrophy, mild erythema, mild  turbinate hypertrophy,  no nasal discharge, bilateral TM normal EYES:   pupils equal and reactive, no scleral injection NECK:  normal ROM, no lymphadenopathy, goiter present (not new, per pt)  RESP:  clear to auscultation bilaterally, no increased work of breathing  CVS:   regular rate and rhythm Skin:   warm and dry    UC Treatments / Results  Labs (all labs ordered are listed, but only abnormal results are displayed) Labs Reviewed - No data to display  EKG   Radiology No results found.  Procedures Procedures (including critical care time)  Medications Ordered in UC Medications  alum & mag hydroxide-simeth (MAALOX/MYLANTA) 200-200-20 MG/5ML suspension 30 mL (30 mLs Oral Given 08/23/21 1736)    And  lidocaine (XYLOCAINE) 2 % viscous mouth solution 15 mL (15 mLs Oral Given 08/23/21 1736)    Initial Impression / Assessment and Plan / UC Course  I have reviewed the triage vital signs and the nursing notes.  Pertinent labs & imaging results that were available during my care of the patient were reviewed by me and considered in my medical decision making (see chart for details).     Pharyngitis Patient is a 71 year old female who presents for 6 days of sore throat.  She has been switching lidocaine.  GI cocktail given today with some improvement.  Strep test at urgent care visit 2 days ago was negative.  Offered COVID testing stated she had a negative home COVID test.  Doubt mononucleosis or food related trauma.  No globus sensation.  Symptoms possibly viral related.  Given that she did have some improvement with GI cocktail today treat with Pepcid and viscous lidocaine.  Advised patient to swish and swallow the viscous lidocaine.  No need for soft tissue neck at this time.  She does have an upcoming ultrasound for her thyroid.   Discussed MDM, treatment plan and plan for follow-up with patient/parent who agrees with plan.      Final Clinical Impressions(s) / UC Diagnoses   Final diagnoses:   Pharyngitis, unspecified etiology     Discharge Instructions      Stop by the pharmacy to pick up your prescriptions.  Swish and then swallow the lidocaine solution every 3-4 hours as needed.  Take the Pepcid/famotidine every 12 hours for the next 1 to 2 weeks.  Follow up with your primary care provider as needed.      ED Prescriptions     Medication Sig Dispense Auth. Provider   famotidine (PEPCID) 20 MG tablet Take 1 tablet (20 mg total) by mouth 2 (two) times daily. 30 tablet Adiba Fargnoli, DO   lidocaine (XYLOCAINE) 2 % solution Use as directed 15 mLs in the mouth or throat every 3 (three) hours as needed for mouth pain. 100 mL Lyndee Hensen, DO      PDMP not reviewed this encounter.   Lyndee Hensen, DO 08/23/21 1906

## 2021-09-13 ENCOUNTER — Ambulatory Visit (INDEPENDENT_AMBULATORY_CARE_PROVIDER_SITE_OTHER): Payer: Medicare Other | Admitting: Nurse Practitioner

## 2021-09-21 ENCOUNTER — Other Ambulatory Visit: Payer: Self-pay | Admitting: Family Medicine

## 2021-09-24 ENCOUNTER — Ambulatory Visit
Admission: EM | Admit: 2021-09-24 | Discharge: 2021-09-24 | Disposition: A | Discharge status: Home / Self Care | Payer: Medicare Other | Attending: Family Medicine | Admitting: Family Medicine

## 2021-09-24 ENCOUNTER — Encounter: Payer: Self-pay | Admitting: Emergency Medicine

## 2021-09-24 DIAGNOSIS — R55 Syncope and collapse: Secondary | ICD-10-CM | POA: Insufficient documentation

## 2021-09-24 DIAGNOSIS — R42 Dizziness and giddiness: Secondary | ICD-10-CM

## 2021-09-24 LAB — CBC WITH DIFFERENTIAL/PLATELET
Abs Immature Granulocytes: 0.03 10*3/uL (ref 0.00–0.07)
Basophils Absolute: 0 10*3/uL (ref 0.0–0.1)
Basophils Relative: 0 %
Eosinophils Absolute: 0.2 10*3/uL (ref 0.0–0.5)
Eosinophils Relative: 4 %
HCT: 39.7 % (ref 36.0–46.0)
Hemoglobin: 12.8 g/dL (ref 12.0–15.0)
Immature Granulocytes: 1 %
Lymphocytes Relative: 38 %
Lymphs Abs: 2.1 10*3/uL (ref 0.7–4.0)
MCH: 29.1 pg (ref 26.0–34.0)
MCHC: 32.2 g/dL (ref 30.0–36.0)
MCV: 90.2 fL (ref 80.0–100.0)
Monocytes Absolute: 0.3 10*3/uL (ref 0.1–1.0)
Monocytes Relative: 6 %
Neutro Abs: 2.7 10*3/uL (ref 1.7–7.7)
Neutrophils Relative %: 51 %
Platelets: 329 10*3/uL (ref 150–400)
RBC: 4.4 MIL/uL (ref 3.87–5.11)
RDW: 13.4 % (ref 11.5–15.5)
WBC: 5.4 10*3/uL (ref 4.0–10.5)
nRBC: 0 % (ref 0.0–0.2)

## 2021-09-24 LAB — BASIC METABOLIC PANEL
Anion gap: 7 (ref 5–15)
BUN: 16 mg/dL (ref 8–23)
CO2: 23 mmol/L (ref 22–32)
Calcium: 9.7 mg/dL (ref 8.9–10.3)
Chloride: 107 mmol/L (ref 98–111)
Creatinine, Ser: 0.87 mg/dL (ref 0.44–1.00)
GFR, Estimated: >60 mL/min (ref >60)
Glucose, Bld: 106 mg/dL — ABNORMAL HIGH (ref 70–99)
Potassium: 3.9 mmol/L (ref 3.5–5.1)
Sodium: 137 mmol/L (ref 135–145)

## 2021-09-24 NOTE — Discharge Instructions (Addendum)
Your lab work looks good.  Be sure to limit the use of Goody powder.  Your blood sugar may have been a little low and the Pepsi helped you.  Follow-up with your PCP if you continue to have intermittent lightheadedness.  If the dizziness returns with numbness, headache, fever, shortness of breath, chest pain or any other symptom please go to the emergency department.

## 2021-09-24 NOTE — ED Provider Notes (Signed)
[facility]    CSN: [id number] Arrival date & time: 09/24/21  1352      History   Chief Complaint Chief Complaint  Patient presents with   Dizziness    HPI Janet [name] is a 71 y.o. female.   HPI  Janet [name] presents for dizziness almost light lightheaded then had some blurry vision while she was driving to see her husband is in the hospital. She had some blurry vision that has resolved. She thought she was going to pass out but didn't.  Says that her legs felt like.  She stopped at the store to get a Pepsi and a Goody powder.  She denies all pain including headache and chest pain. Denies shortness of breath, palpitations, nausea, vomiting, diarrhea, decreased appetite and polyuria.  There has been no fever.  She is otherwise feeling well.   Past Medical History:  Diagnosis Date   Asthma    Cancer ([place])    kidney   Clotting disorder ([place])    Hypertension    Hypertension    Kidney mass    Shortness of breath    Thyroid disease    Varicose vein of leg    with leg ulcer    Patient Active Problem List   Diagnosis Date Noted   Genetic testing 05/08/2021   Numbness and tingling of left lower extremity 06/23/2018   Essential hypertension 05/02/2018   Swelling of limb 05/02/2018   Varicose veins with ulcer, left ([place]) 05/02/2018   Left hip pain 11/01/2017   Headache disorder 08/29/2017   Premature ventricular contractions 05/25/2016   Asthma in adult without complication 62/95/2841   Heart palpitations 04/23/2016   History of kidney cancer 04/23/2016   Bilateral cellulitis of lower leg 10/03/2015   Renal mass, left 02/24/2015   Thrombophlebitis 09/29/2014   Chest pressure 09/12/2014   Dyspnea on exertion 09/12/2014   Generalized weakness 09/09/2014   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 Authorizing Provider  aspirin EC 81 MG tablet Take 81 mg by mouth daily.   Yes [provider]  lisinopril (ZESTRIL) 20 MG tablet 2 tablets (40 mg total) daily. 10/19/13  Yes [provider]  potassium chloride SA (K-DUR,KLOR-CON) 20 MEQ tablet Take 20 mEq by mouth daily.    Yes [provider]  albuterol (PROVENTIL HFA;VENTOLIN HFA) 108 (90 BASE) MCG/ACT inhaler Inhale 2 puffs into the lungs every 6 (six) hours as needed for wheezing or shortness of breath. 12/24/14   [name], MD  Butalbital-APAP-Caffeine 50-300-40 MG CAPS take 1 to 2 capsules by mouth every 6 hours if needed for headache 07/30/17   [provider]  Elastic Bandages & Supports (EMS ANTI-EMBOLISM STOCKINGS) MISC Apply in the morning and remove at night. 06/23/14   [provider]  famotidine (PEPCID) 20 MG tablet Take 1 tablet (20 mg total) by mouth 2 (two) times daily. 08/23/21   Lyanne Kates, [name], DO  lidocaine (XYLOCAINE) 2 % solution Use as directed 15 mLs in the mouth or throat every 3 (three) hours as needed for mouth pain. 08/23/21   Lyndee Hensen, DO  meclizine (ANTIVERT) 25 MG tablet Take by mouth.    [provider]  naproxen (NAPROSYN) 500 MG tablet Take by mouth. 05/15/16   [provider]  traMADol (ULTRAM) 50 MG tablet Take 1 tablet (50 mg total) by mouth every 6 (six) hours as needed. 08/21/21   Kris Hartmann, NP  linaclotide Rolan Lipa) 145 MCG CAPS capsule Take 1 capsule (145 mcg total) by mouth daily before breakfast. 09/30/19 05/30/20  Lin Landsman, MD    Family  History Family History  Problem Relation Age of Onset   Colon cancer Mother 22   Hypertension Mother    Heart attack Father 73   Hypertension Father    Hyperlipidemia Father    Diabetes Father    Heart attack Brother    Heart attack Brother    Heart attack Daughter    Heart Problems Son    Stroke Neg Hx    Breast cancer Neg Hx     Social History Social History   Tobacco Use   Smoking status: Former    Years: 5.00    Types: Cigarettes    Quit date: 1995    Years since quitting: 28.7    Passive exposure: Past (Long time ago i or 2 cigarettes)   Smokeless tobacco: Never   Tobacco comments:    1 cig daily--quit around 1995  Vaping Use   Vaping Use: Never used  Substance Use Topics   Alcohol use: No   Drug use: No     Allergies   Amlodipine, Shellfish allergy, Thiazide-type diuretics, and Penicillins   Review of Systems Review of Systems : negative unless otherwise stated in HPI.      Physical Exam Triage Vital Signs ED Triage Vitals  Enc Vitals Group     BP 09/24/21 1436 137/68     Pulse Rate 09/24/21 1436 67     Resp 09/24/21 1436 14     Temp 09/24/21 1436 98.2 F (36.8 C)     Temp Source 09/24/21 1436 Oral     SpO2 09/24/21 1436 100 %     Weight 09/24/21 1433 197 lb (89.4 kg)     Height 09/24/21 1433 '5\' 6"'$  (1.676 m)     Head Circumference --      Peak Flow --      Pain Score 09/24/21 1433 0     Pain Loc --      Pain Edu? --      Excl. in [place]? --    No data found.  Updated Vital Signs BP 137/68 (BP Location: Right Arm)   Pulse 67   Temp 98.2 F (36.8 C) (Oral)   Resp 14   Ht '5\' 6"'$  (1.676 m)   Wt 89.4 kg   SpO2 100%   BMI 31.80 kg/m   Visual Acuity Right Eye Distance:   Left Eye Distance:   Bilateral Distance:    Right Eye Near:   Left Eye Near:    Bilateral Near:     Physical Exam  GEN: alert, well  appearing female, in no acute distress    HENT:  mucus membranes moist, oropharyngeal without lesions or erythema ,  nares patent,  no nasal discharge, palpable mass in her left cheek EYES:   pupils equal and reactive, EOM intact NECK:  supple, normal ROM, no lymphadenopathy  RESP:  clear to auscultation bilaterally, no increased work of breathing  CVS:   regular rate and rhythm, no murmur, distal pulses intact   ABD:  soft, non-tender; bowel sounds present; no palpable masses EXT:   normal ROM, atraumatic, no edema  NEURO:  alert, oriented, speech normal, CN 2-12 grossly intact, no facial droop,  sensation grossly intact, strength 5/5 bilateral UE and LE, normal coordination, normal finger to nose, normal heel to shin, Negative Romberg , normal gait Skin:   warm and dry, no rash, normal skin turgor Psych: Normal affect, appropriate speech and behavior    UC Treatments / Results  Labs (all labs ordered are listed, but only abnormal results are displayed) Labs Reviewed  BASIC METABOLIC PANEL - Abnormal; Notable for the following components:      Result Value   Glucose, Bld 106 (*)    All other components within normal limits  CBC WITH DIFFERENTIAL/PLATELET    EKG   Radiology No results found.  Procedures Procedures (including critical care time)  Medications Ordered in UC Medications - No data to display  Initial Impression / Assessment and Plan / UC Course  I have reviewed the triage vital signs and the nursing notes.  Pertinent labs & imaging results that were available during my care of the patient were reviewed by me and considered in my medical decision making (see chart for details).     Patient is a 71 year old female who presents after lightheadedness while driving to go see her husband who is in the hospital requiring multiple blood transfusions.  She was driving today when her symptoms started.  She felt better after drinking Pepsi.  She came in to be sure that she is doing okay. BMP with glucose 106 and no significant electrolyte abnormalities.  CBC without anemia leukocytosis.  This may be  vasovagal or stress-induced.  Advised her to limit her use of Goody powders due to risk of GI bleed.  Her serum creatinine is stable.,  Patient is well-appearing, well-hydrated and without respiratory distress.  And neuro exams are unremarkable.  EKG without acute ST or T wave changes.  No significant number of APCs or PACs seen.   She may have had an element of hypoglycemia.  Patient updated on exam and lab results.  She feels okay and able to drive.  Return and ED precautions provided and patient voiced understanding.  Discussed MDM, treatment plan and plan for follow-up with patient/parent who agrees with plan.    Final diagnoses:  Near syncope     Discharge Instructions      Your lab work looks good.  Be sure to limit the use of Goody powder.  Your blood sugar may have been a little low and the Pepsi helped you.  Follow-up with your PCP if you continue to have intermittent lightheadedness.  If the dizziness returns with numbness, headache, fever, shortness of breath, chest pain or any other symptom please go to the emergency department.     ED Prescriptions   None    PDMP not reviewed this encounter.   Lyndee Hensen, DO 09/24/21 2244

## 2021-09-24 NOTE — ED Triage Notes (Signed)
Patient states that she has had some dizziness off and on for the past 2 weeks.  Patient states that around 1:30 pm today she was driving to the [place] hospital to see her husband, when she started to feel lightheaded and dizzy.  Patient states that she stopped at a drug store and took some BC powder and started to feel better but wants to be checked out before she head down there.  Patient denies any pain.  Patient states that she no longer has the dizziness but just feels tired.

## 2021-09-25 ENCOUNTER — Inpatient Hospital Stay: Payer: Medicare Other | Attending: Oncology | Admitting: Oncology

## 2021-09-25 ENCOUNTER — Encounter: Payer: Self-pay | Admitting: Oncology

## 2021-09-25 VITALS — BP 144/69 | HR 91 | Temp 98.7°F | Resp 18 | Wt 208.0 lb

## 2021-09-25 DIAGNOSIS — N2889 Other specified disorders of kidney and ureter: Secondary | ICD-10-CM | POA: Diagnosis not present

## 2021-09-25 DIAGNOSIS — Z79899 Other long term (current) drug therapy: Secondary | ICD-10-CM | POA: Insufficient documentation

## 2021-09-25 DIAGNOSIS — Z809 Family history of malignant neoplasm, unspecified: Secondary | ICD-10-CM | POA: Diagnosis not present

## 2021-09-25 DIAGNOSIS — Z87891 Personal history of nicotine dependence: Secondary | ICD-10-CM | POA: Diagnosis not present

## 2021-09-25 NOTE — Progress Notes (Signed)
Patient here for oncology follow-up appointment, concerns of dizziness and fatigue  ?

## 2021-09-25 NOTE — Progress Notes (Signed)
Hematology/Oncology Consult note [phone] [phone]         Patient Care Team: Marguerita Merles, MD as PCP - General (Family Medicine) Lucky Cowboy, Erskine Squibb, MD as Consulting Physician (Vascular Surgery) Earlie Server, MD as Consulting Physician (Hematology and Oncology) Wellington Hampshire, MD as Consulting Physician (Cardiology)  REFERRING PROVIDER: Marguerita Merles, MD  CHIEF COMPLAINTS/REASON FOR VISIT:  Evaluation of renal mass  HISTORY OF PRESENTING ILLNESS:   Janet HANNA is a  71 y.o.  female with PMH listed below was seen in consultation at the request of  Marguerita Merles, MD  for evaluation of renal mass  02/28/2021, patient had ultrasound done which showed increased size of left kidney mass, up to 2.9 cm, concerning for renal neoplasm.  Right renal cyst, unchanged.  03/23/2021, CT abdomen pelvis with contrast showed 1. Exophytic enhancing mass at the lateral left kidney measuring up to 3 cm, should be considered renal cell carcinoma until proven otherwise. No lymphadenopathy or venous involvement visualized. 2. Right renal cyst. 3. Colonic diverticulosis. 4. Cholelithiasis.  03/31/2021: CT chest with contrast showed no definite findings of metastatic disease to the thorax.  Aortic atherosclerosis. Patient was referred to establish care with urology and was seen by Dr. Diamantina Providence on 04/18/2021.  Options of active surveillance versus IR ablation versus partial nephrectomy were reviewed by neurology with patient.  Patient is not interested in surgery.  She elected to have ablation.  \  Patient reports that she was not contacted by radiology for ablation.  Today she has no new complaints. Denies any urinary difficulty, hematuria  Review of Systems  Constitutional:  Negative for appetite change, chills, fatigue and fever.  HENT:   Negative for hearing loss and voice change.   Eyes:  Negative for eye problems.  Respiratory:  Negative for chest tightness and cough.    Cardiovascular:  Negative for chest pain.  Gastrointestinal:  Negative for abdominal distention, abdominal pain and blood in stool.  Endocrine: Negative for hot flashes.  Genitourinary:  Negative for difficulty urinating and frequency.   Musculoskeletal:  Negative for arthralgias.  Skin:  Negative for itching and rash.  Neurological:  Negative for extremity weakness.  Hematological:  Negative for adenopathy.  Psychiatric/Behavioral:  Negative for confusion.     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    SURGICAL HISTORY: Past Surgical History:  Procedure Laterality Date   ABDOMINAL HYSTERECTOMY     COLONOSCOPY WITH PROPOFOL N/A 05/25/2019   Procedure: COLONOSCOPY WITH PROPOFOL;  Surgeon: Lin Landsman, MD;  Location: [facility] ENDOSCOPY;  Service: Gastroenterology;  Laterality: N/A;    SOCIAL HISTORY: Social History   Socioeconomic History   Marital status: Married    Spouse name: Not on file   Number of children: Not on file   Years of education: Not on file   Highest education level: Not on file  Occupational History   Not on file  Tobacco Use   Smoking status: Former    Years: 5.00    Types: Cigarettes    Quit date: 1995    Years since quitting: 28.7    Passive exposure: Past (Long time ago i  or 2 cigarettes)   Smokeless tobacco: Never   Tobacco comments:    1 cig daily--quit around 1995  Vaping Use   Vaping Use: Never used  Substance and Sexual Activity   Alcohol use: No   Drug use: No   Sexual activity: Not on file  Other Topics Concern   Not on file  Social History Narrative   Not on file   Social Determinants of Health   Financial Resource Strain: Low Risk  (04/10/2021)   Overall Financial Resource Strain (CARDIA)    Difficulty of  Paying Living Expenses: Not hard at all  Food Insecurity: No Food Insecurity (04/10/2021)   Hunger Vital Sign    Worried About Running Out of Food in the Last Year: Never true    Ran Out of Food in the Last Year: Never true  Transportation Needs: No Transportation Needs (04/10/2021)   PRAPARE - Hydrologist (Medical): No    Lack of Transportation (Non-Medical): No  Physical Activity: Inactive (04/10/2021)   Exercise Vital Sign    Days of Exercise per Week: 0 days    Minutes of Exercise per Session: 0 min  Stress: No Stress Concern Present (04/10/2021)   [place]    Feeling of Stress : Only a little  Social Connections: Socially Integrated (04/10/2021)   Social Connection and Isolation Panel [NHANES]    Frequency of Communication with Friends and Family: Three times a week    Frequency of Social Gatherings with Friends and Family: Three times a week    Attends Religious Services: 1 to 4 times per year    Active Member of Clubs or Organizations: No    Attends Archivist Meetings: 1 to 4 times per year    Marital Status: Married  Human resources officer Violence: Not At Risk (04/10/2021)   Humiliation, Afraid, Rape, and Kick questionnaire    Fear of Current or Ex-Partner: No    Emotionally Abused: No    Physically Abused: No    Sexually Abused: No    FAMILY HISTORY: Family History  Problem Relation Age of Onset   Colon cancer Mother 13   Hypertension Mother    Heart attack Father 10   Hypertension Father    Hyperlipidemia Father    Diabetes Father    Heart attack Brother    Heart attack Brother    Heart attack Daughter    Heart Problems Son    Stroke Neg Hx    Breast cancer Neg Hx     ALLERGIES:  is allergic to amlodipine, shellfish allergy, thiazide-type diuretics, and penicillins.  MEDICATIONS:  Current Outpatient Medications  Medication Sig Dispense Refill   albuterol  (PROVENTIL HFA;VENTOLIN HFA) 108 (90 BASE) MCG/ACT inhaler Inhale 2 puffs into the lungs every 6 (six) hours as needed for wheezing or shortness of breath. 1 Inhaler 0   aspirin EC 81 MG tablet Take 81 mg by mouth daily.     Butalbital-APAP-Caffeine 50-300-40 MG CAPS take 1 to 2 capsules by mouth every 6 hours if needed for headache     Elastic Bandages & Supports (EMS ANTI-EMBOLISM STOCKINGS) MISC Apply in the morning and remove at night.     famotidine (PEPCID) 20 MG tablet Take 1 tablet (20 mg total) by mouth 2 (two) times daily. 30 tablet 0   lidocaine (XYLOCAINE) 2 % solution Use as directed 15 mLs in the mouth or throat every 3 (three) hours as  needed for mouth pain. 100 mL 0   lisinopril (ZESTRIL) 20 MG tablet 2 tablets (40 mg total) daily.     meclizine (ANTIVERT) 25 MG tablet Take by mouth.     naproxen (NAPROSYN) 500 MG tablet Take by mouth.     potassium chloride SA (K-DUR,KLOR-CON) 20 MEQ tablet Take 20 mEq by mouth daily.      traMADol (ULTRAM) 50 MG tablet Take 1 tablet (50 mg total) by mouth every 6 (six) hours as needed. 15 tablet 0   No current facility-administered medications for this visit.     PHYSICAL EXAMINATION: ECOG PERFORMANCE STATUS: 0 - Asymptomatic Vitals:   09/25/21 1425  BP: (!) 144/69  Pulse: 91  Resp: 18  Temp: 98.7 F (37.1 C)  SpO2: 97%   Filed Weights   09/25/21 1425  Weight: 208 lb (94.3 kg)    Physical Exam Constitutional:      General: She is not in acute distress. HENT:     Head: Normocephalic and atraumatic.  Eyes:     General: No scleral icterus. Cardiovascular:     Rate and Rhythm: Normal rate and regular rhythm.     Heart sounds: Normal heart sounds.  Pulmonary:     Effort: Pulmonary effort is normal. No respiratory distress.     Breath sounds: No wheezing.  Abdominal:     General: Bowel sounds are normal. There is no distension.     Palpations: Abdomen is soft.  Musculoskeletal:        General: No deformity. Normal range of  motion.     Cervical back: Normal range of motion and neck supple.  Skin:    General: Skin is warm and dry.     Findings: No erythema or rash.  Neurological:     Mental Status: She is alert and oriented to person, place, and time. Mental status is at baseline.     Cranial Nerves: No cranial nerve deficit.     Coordination: Coordination normal.  Psychiatric:        Mood and Affect: Mood normal.     LABORATORY DATA:  I have reviewed the data as listed Lab Results  Component Value Date   WBC 5.4 09/24/2021   HGB 12.8 09/24/2021   HCT 39.7 09/24/2021   MCV 90.2 09/24/2021   PLT 329 09/24/2021   Recent Labs    01/10/21 1935 03/13/21 1154 09/24/21 1510  NA 138 136 137  K 3.6 4.0 3.9  CL 107 102 107  CO2 '26 28 23  '$ GLUCOSE 87 95 106*  BUN 24* 18 16  CREATININE 0.97 0.87 0.87  CALCIUM 9.4 9.6 9.7  GFRNONAA >60 >60 >60  PROT  --  7.8  --   ALBUMIN  --  3.8  --   AST  --  16  --   ALT  --  13  --   ALKPHOS  --  47  --   BILITOT  --  0.3  --     Iron/TIBC/Ferritin/ %Sat No results found for: "IRON", "TIBC", "FERRITIN", "IRONPCTSAT"    RADIOGRAPHIC STUDIES: I have personally reviewed the radiological images as listed and agreed with the findings in the report. No results found.    ASSESSMENT & PLAN:  1. Renal mass, left   2. Family history of cancer   3. Former smoker    #Enlarging left renal mass, Today I discussed with patient about her options.  She is not interested in surgery.  Currently she would like  to have active surveillance. We will repeat CT scan.  If progressively enlarging renal lesion, she will consider IR for ablation.  #Family history of cancer.  negative genetic testing Orders Placed This Encounter  Procedures   CT Abdomen Pelvis W Contrast    Standing Status:   Future    Standing Expiration Date:   09/25/2022    Order Specific Question:   If indicated for the ordered procedure, I authorize the administration of contrast media per Radiology  protocol    Answer:   Yes    Order Specific Question:   Preferred imaging location?    Answer:   [facility]    Order Specific Question:   Is Oral Contrast requested for this exam?    Answer:   Yes, Per Radiology protocol    All questions were answered. The patient knows to call the clinic with any problems questions or concerns.  cc Marguerita Merles, MD    Return of visit: To be determined, depending on CT scan results.  Earlie Server, MD, PhD [facility] Hematology Oncology 09/25/2021

## 2021-10-05 ENCOUNTER — Ambulatory Visit: Payer: Medicare Other

## 2021-10-12 ENCOUNTER — Other Ambulatory Visit: Payer: Self-pay | Admitting: Oncology

## 2021-10-13 ENCOUNTER — Ambulatory Visit
Admission: RE | Admit: 2021-10-13 | Discharge: 2021-10-13 | Disposition: A | Discharge status: Home / Self Care | Payer: Medicare Other | Source: Ambulatory Visit | Attending: Oncology | Admitting: Oncology

## 2021-10-13 DIAGNOSIS — N2889 Other specified disorders of kidney and ureter: Secondary | ICD-10-CM | POA: Diagnosis not present

## 2021-10-13 MED ORDER — IOHEXOL 300 MG/ML  SOLN
100.0000 mL | Freq: Once | INTRAMUSCULAR | Status: AC | PRN
Start: 2021-10-13 — End: 2021-10-13
  Administered 2021-10-13: 100 mL via INTRAVENOUS

## 2021-10-16 ENCOUNTER — Telehealth: Payer: Self-pay

## 2021-10-16 DIAGNOSIS — N2889 Other specified disorders of kidney and ureter: Secondary | ICD-10-CM

## 2021-10-16 NOTE — Telephone Encounter (Signed)
-----   Message from Earlie Server, MD sent at 10/15/2021  4:21 PM EDT ----- Please let her know that her kidney mass is increasing in size. I recommend IR consultation for biopsy and ablation. [ Previously referred to IR by urologist and she decided to wait]  Thanks.

## 2021-10-16 NOTE — Telephone Encounter (Signed)
Pt agreeable to IR referral. Referral entered in EPIC

## 2021-10-23 ENCOUNTER — Telehealth: Payer: Self-pay | Admitting: *Deleted

## 2021-10-23 ENCOUNTER — Other Ambulatory Visit: Payer: Self-pay | Admitting: Oncology

## 2021-10-23 DIAGNOSIS — N2889 Other specified disorders of kidney and ureter: Secondary | ICD-10-CM

## 2021-10-23 NOTE — Telephone Encounter (Signed)
Called to schedule consult for lt renal mass. No ans/mach hm# lmom cell/vm

## 2021-10-23 NOTE — Telephone Encounter (Signed)
Patient called stating that she has not heard back from anyone regarding the referral to IR. Please check on this and call patient back

## 2021-10-23 NOTE — Telephone Encounter (Signed)
Contacted IR and spoke to office assistant who said she would call her to set up appt.

## 2021-10-23 NOTE — Telephone Encounter (Signed)
Confirmed with IR that referral was received. Spoke to office and they will contact pt to set up appt.

## 2021-10-26 ENCOUNTER — Telehealth: Payer: Self-pay | Admitting: *Deleted

## 2021-10-26 NOTE — Telephone Encounter (Signed)
Patient called asking what is going on with her Kidney Cancer stating it has been a week and she still has not heard from anyone

## 2021-10-26 NOTE — Telephone Encounter (Signed)
Looks like interventional radiology has trying to get in touch with her to schedule appt. Please see ph notes in chart. She can give them a call to schedule appt @ [phone].

## 2021-10-26 NOTE — Telephone Encounter (Addendum)
Call returned to patient and gave her the number to call to get an appointment. She read itr back to me and will call herself

## 2021-11-02 ENCOUNTER — Ambulatory Visit
Admission: RE | Admit: 2021-11-02 | Discharge: 2021-11-02 | Disposition: A | Discharge status: Home / Self Care | Payer: Medicare Other | Source: Ambulatory Visit | Attending: Oncology | Admitting: Oncology

## 2021-11-02 DIAGNOSIS — N2889 Other specified disorders of kidney and ureter: Secondary | ICD-10-CM

## 2021-11-02 HISTORY — PX: IR RADIOLOGIST EVAL & MGMT: IMG5224

## 2021-11-02 NOTE — Consult Note (Signed)
Chief Complaint: Patient was seen in consultation today for left renal malignnacy at the request of Yu,Zhou  Referring Physician(s): Yu,Zhou  History of Present Illness: Janet [name] is a 71 y.o. female who presents to IR clinic today for discussion of percutaneous cryoablation for enlarging solid enhancing left renal mass.  The mass was initially discovered on 03/10/2021 on ultrasound.  Subsequent CT on 03/23/2021 confirmed a 3 cm solid enhancing left renal mass.  No evidence of metastatic disease identified on chest CT from 03/31/2021.  After discussing her options with Dr. Diamantina Providence of Urology, she opted for active surveillance.  Repeat CT of the abdomen pelvis on 10/13/2021 showed further enlargement of the solid enhancing left renal mass which now measures 3.4 x 2.8 cm compared to 2.9 x 2.3 cm on the prior study.    She does not want to proceed with surgery, but is considering cryoablation. She has mild chronic right flank pain.  She does not have significant left flank pain.  She denies hematuria, dysuria, or unintentional weight loss.   Past Medical History:  Diagnosis Date   Asthma    Cancer ([place])    kidney   Clotting disorder ([place])    Hypertension    Hypertension    Kidney mass    Shortness of breath    Thyroid disease    Varicose vein of leg    with leg ulcer    Past Surgical History:  Procedure Laterality Date   ABDOMINAL HYSTERECTOMY     COLONOSCOPY WITH PROPOFOL N/A 05/25/2019   Procedure: COLONOSCOPY WITH PROPOFOL;  Surgeon: Lin Landsman, MD;  Location: [facility] ENDOSCOPY;  Service: Gastroenterology;  Laterality: N/A;    Allergies: Amlodipine, Shellfish allergy, Thiazide-type diuretics, and Penicillins  Medications: Prior to Admission medications   Medication Sig Start Date End Date Taking? Authorizing Provider  albuterol (PROVENTIL HFA;VENTOLIN HFA) 108 (90 BASE) MCG/ACT inhaler Inhale 2 puffs into the lungs every 6 (six) hours as needed for wheezing or  shortness of breath. 12/24/14  Yes [name], MD  aspirin EC 81 MG tablet Take 81 mg by mouth daily.   Yes [provider]  Butalbital-APAP-Caffeine 50-300-40 MG CAPS take 1 to 2 capsules by mouth every 6 hours if needed for headache 07/30/17  Yes [provider]  Elastic Bandages & Supports (EMS ANTI-EMBOLISM STOCKINGS) MISC Apply in the morning and remove at night. 06/23/14  Yes [provider]  famotidine (PEPCID) 20 MG tablet Take 1 tablet (20 mg total) by mouth 2 (two) times daily. 08/23/21  Yes Brimage, Vondra, DO  lisinopril (ZESTRIL) 20 MG tablet 2 tablets (40 mg total) daily. 10/19/13  Yes [provider]  naproxen (NAPROSYN) 500 MG tablet Take by mouth. 05/15/16  Yes [provider]  potassium chloride SA (K-DUR,KLOR-CON) 20 MEQ tablet Take 20 mEq by mouth daily.    Yes [provider]  lidocaine (XYLOCAINE) 2 % solution Use as directed 15 mLs in the mouth or throat every 3 (three) hours as needed for mouth pain. Patient not taking: Reported on 11/02/2021 08/23/21   Lyndee Hensen, DO  meclizine (ANTIVERT) 25 MG tablet Take by mouth. Patient not taking: Reported on 11/02/2021    [provider]  traMADol (ULTRAM) 50 MG tablet Take 1 tablet (50 mg total) by mouth every 6 (six) hours as needed. Patient not taking: Reported on 11/02/2021 08/21/21   Kris Hartmann, NP  linaclotide [facility]) 145 MCG CAPS capsule Take 1 capsule (145 mcg total) by  mouth daily before breakfast. 09/30/19 05/30/20  Lin Landsman, MD     Family History  Problem Relation Age of Onset   Colon cancer Mother 46   Hypertension Mother    Heart attack Father 25   Hypertension Father    Hyperlipidemia Father    Diabetes Father    Heart attack Brother    Heart attack Brother    Heart attack Daughter    Heart Problems Son    Stroke Neg Hx    Breast cancer Neg Hx     Social History   Socioeconomic History   Marital status: Married    Spouse name:  Not on file   Number of children: Not on file   Years of education: Not on file   Highest education level: Not on file  Occupational History   Not on file  Tobacco Use   Smoking status: Former    Years: 5.00    Types: Cigarettes    Quit date: 1995    Years since quitting: 28.8    Passive exposure: Past (Long time ago i or 2 cigarettes)   Smokeless tobacco: Never   Tobacco comments:    1 cig daily--quit around 1995  Vaping Use   Vaping Use: Never used  Substance and Sexual Activity   Alcohol use: No   Drug use: No   Sexual activity: Not on file  Other Topics Concern   Not on file  Social History Narrative   Not on file   Social Determinants of Health   Financial Resource Strain: Low Risk  (04/10/2021)   Overall Financial Resource Strain (CARDIA)    Difficulty of Paying Living Expenses: Not hard at all  Food Insecurity: No Food Insecurity (04/10/2021)   Hunger Vital Sign    Worried About Running Out of Food in the Last Year: Never true    [place] in the Last Year: Never true  Transportation Needs: No Transportation Needs (04/10/2021)   PRAPARE - Hydrologist (Medical): No    Lack of Transportation (Non-Medical): No  Physical Activity: Inactive (04/10/2021)   Exercise Vital Sign    Days of Exercise per Week: 0 days    Minutes of Exercise per Session: 0 min  Stress: No Stress Concern Present (04/10/2021)   [place]    Feeling of Stress : Only a little  Social Connections: Socially Integrated (04/10/2021)   Social Connection and Isolation Panel [NHANES]    Frequency of Communication with Friends and Family: Three times a week    Frequency of Social Gatherings with Friends and Family: Three times a week    Attends Religious Services: 1 to 4 times per year    Active Member of Clubs or Organizations: No    Attends Archivist Meetings: 1 to 4 times per year     Marital Status: Married   Review of Systems: A 12 point ROS discussed and pertinent positives are indicated in the HPI above.  All other systems are negative.  Review of Systems  Vital Signs: BP (!) 144/66 (BP Location: Left Arm, Patient Position: Sitting, Cuff Size: Normal)   Pulse 89   Temp 98.4 F (36.9 C) (Oral)   Resp 16   SpO2 96%   Advance Care Plan: The advanced care plan/surrogate decision maker was discussed at the time of visit and documented in the medical record.   Physical Exam Constitutional:  Appearance: Normal appearance.  Cardiovascular:     Rate and Rhythm: Normal rate and regular rhythm.     Heart sounds: Normal heart sounds.  Pulmonary:     Effort: Pulmonary effort is normal.     Breath sounds: Normal breath sounds.  Abdominal:     General: Abdomen is flat. Bowel sounds are normal.     Palpations: Abdomen is soft.  Skin:    General: Skin is warm and dry.  Neurological:     Mental Status: She is oriented to person, place, and time.  Psychiatric:        Mood and Affect: Mood normal.   Imaging: CT ABDOMEN PELVIS W WO CONTRAST  Result Date: 10/15/2021 CLINICAL DATA:  Kidney cancer staging, left renal lesion * Tracking Code: BO * EXAM: CT ABDOMEN AND PELVIS WITHOUT AND WITH CONTRAST TECHNIQUE: Multidetector CT imaging of the abdomen and pelvis was performed following the standard protocol before and following the bolus administration of intravenous contrast. RADIATION DOSE REDUCTION: This exam was performed according to the departmental dose-optimization program which includes automated exposure control, adjustment of the mA and/or kV according to patient size and/or use of iterative reconstruction technique. CONTRAST:  183m OMNIPAQUE IOHEXOL 300 MG/ML  SOLN COMPARISON:  03/23/2021, 02/12/2016 FINDINGS: Lower chest: No acute abnormality. Hepatobiliary: No solid liver abnormality is seen. Tiny gallstones and or sludge in the gallbladder. No gallbladder wall  thickening, or biliary dilatation. Pancreas: Unremarkable. No pancreatic ductal dilatation or surrounding inflammatory changes. Spleen: Normal in size without significant abnormality. Adrenals/Urinary Tract: Adrenal glands are unremarkable. Interval enlargement of a heterogeneously enhancing, exophytic mass of the peripheral superior pole of the left kidney measuring 3.4 x 2.8 cm, previously 2.9 x 2.3 cm (series 5, image 50). Additional simple, benign right renal cortical cysts, for which no further follow-up or characterization is required. Bladder is unremarkable. Stomach/Bowel: Stomach is within normal limits. Appendix appears normal. No evidence of bowel wall thickening, distention, or inflammatory changes. Descending and sigmoid diverticulosis. Vascular/Lymphatic: Scattered aortic atherosclerosis. 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 Enlarged bilateral inguinal, iliac, and pelvic sidewall lymph nodes, unchanged, largest left external iliac nodes measuring up to 3.6 x 1.6 cm (series 10, image 127). Reproductive: Status post hysterectomy. Other: Small, fat containing umbilical hernia. No ascites. Pelvic floor prolapse with a large cystocele. Musculoskeletal: No acute or significant osseous findings. IMPRESSION: 1. Interval enlargement of an exophytic mass of the peripheral superior pole of the left kidney, consistent with an enlarging renal cell carcinoma. 2. Enlarged bilateral inguinal, iliac, and pelvic sidewall lymph nodes, unchanged compared to prior examinations dating back to 2018 and nonspecific, metastatic disease not favored. Attention on follow-up. 3. 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 4. Status post hysterectomy. 5. Descending and sigmoid diverticulosis without evidence of acute diverticulitis. 6. Cholelithiasis.  Aortic Atherosclerosis (ICD10-I70.0). Electronically Signed   By: ADelanna AhmadiM.D.   On: 10/15/2021 13:45    Labs:  CBC: Recent Labs    01/10/21 1901 03/13/21 1154 09/24/21 1510  WBC 7.5 6.6 5.4  HGB 13.0 12.9 12.8  HCT 41.9 40.3 39.7  PLT 284 297 329    COAGS: No results for input(s): "INR", "APTT" in the last 8760 hours.  BMP: Recent Labs    01/10/21 1935 03/13/21 1154 09/24/21 1510  NA 138 136 137  K 3.6 4.0 3.9  CL 107 102 107  CO2 '26 28 23  '$ GLUCOSE 87 95 106*  BUN 24* 18 16  CALCIUM 9.4 9.6 9.7  CREATININE 0.97 0.87 0.87  GFRNONAA >60 >60 >60    LIVER FUNCTION TESTS: Recent Labs    03/13/21 1154  BILITOT 0.3  AST 16  ALT 13  ALKPHOS 47  PROT 7.8  ALBUMIN 3.8    TUMOR MARKERS: No results for input(s): "AFPTM", "CEA", "CA199", "CHROMGRNA" in the last 8760 hours.  Assessment and Plan:  71 year old woman with enlarging, solid enhancing left renal mass presents to IR for discussion of percutaneous cryoablation.  She is an appropriate candidate for renal mass cryoablation.  We discussed the risks and benefits of image guided renal cryoablation including, but not limited to, failure to treat entire lesion, bleeding, infection, damage to adjacent structures, hematuria, urine leak, decrease in renal function or post procedural neuropathy.  We also discussed that we would perform follow up scans out to 3 years to make sure there was no recurrent or new renal masses.  All of the patient's questions were answered and the patient is agreeable to proceed. Consent signed and in chart.  Plan: - Please schedule for renal mass cryoablation at [facility] with me at earliest available appointment  Thank you for this interesting consult.  I greatly enjoyed meeting Janet [name] and look forward to participating in their care.  A copy of this report was sent to the requesting provider on this date.  Electronically Signed: Paula Libra Elyjah Hazan 11/02/2021, 4:56 PM   I spent a  total of  40 Minutes in face to face in clinical consultation, greater than 50% of which was counseling/coordinating care for left renal neoplasm.

## 2021-11-09 ENCOUNTER — Other Ambulatory Visit (HOSPITAL_COMMUNITY): Payer: Self-pay | Admitting: Interventional Radiology

## 2021-11-09 DIAGNOSIS — N2889 Other specified disorders of kidney and ureter: Secondary | ICD-10-CM

## 2021-11-22 NOTE — Progress Notes (Addendum)
COVID Vaccine Completed:  Yes  Date of COVID positive in last 90 days:  No  PCP - Delight Stare, MD Cardiologist - Kathlyn Sacramento, MD Pulmonologist - Christinia Gully, MD  Chest x-ray - 01-21-21 CE[facility], CT chest 03-31-21 Epic EKG - 04-29-21 [facility].  Copy requested Stress Test - 2002 [facility] ECHO - 01-19-21 Epic Cardiac Cath - N/A Pacemaker/ICD device last checked: Spinal Cord Stimulator:  N/A Coronary CT - 01-26-21 Epic  Bowel Prep - N/A  Sleep Study - N/A CPAP -   Fasting Blood Sugar - N/A Checks Blood Sugar _____ times a day  Last dose of GLP1 agonist-  N/A GLP1 instructions:  N/A   Last dose of SGLT-2 inhibitors-  N/A SGLT-2 instructions: N/A   Blood Thinner Instructions: Aspirin Instructions:  ASA 81.  To stop 5 days before procedure per patient Last Dose:  Activity level:  Can go up a flight of stairs and perform activities of daily living without symptoms of chest pain.  Patient states that she does have shortness of breath with exertion and has to stop and rest with activity.  States that this has been an ongoing issue but has not worsened.   Anesthesia review:  mild nonobstructive CAD, Palpitations, PVCs, HTN, chronic venous insufficiency.  Chest pain and shortness of breath evaluated by cardiology.  Patient denies shortness of breath, fever, cough and chest pain at PAT appointment  Patient verbalized understanding of instructions that were given to them at the PAT appointment. Patient was also instructed that they will need to review over the PAT instructions again at home before surgery.

## 2021-11-22 NOTE — Patient Instructions (Addendum)
SURGICAL WAITING ROOM VISITATION Patients having surgery or a procedure may have no more than 2 support people in the waiting area - these visitors may rotate.   Children under the age of 55 must have an adult with them who is not the patient. If the patient needs to stay at the hospital during part of their recovery, the visitor guidelines for inpatient rooms apply. Pre-op nurse will coordinate an appropriate time for 1 support person to accompany patient in pre-op.  This support person may not rotate.    Please refer to the [facility] website for the visitor guidelines for Inpatients (after your surgery is over and you are in a regular room).      Your procedure is scheduled on:  12-20-21   Report to [facility] Main Entrance    Report to admitting at 6:30 AM   Call this number if you have problems the morning of surgery [phone]   Do not eat food or drink liquids :After Midnight.         If you have questions, please contact your surgeon's office.   FOLLOW  ANY ADDITIONAL PRE OP INSTRUCTIONS YOU RECEIVED FROM YOUR SURGEON'S OFFICE!!!     Oral Hygiene is also important to reduce your risk of infection.                                    Remember - BRUSH YOUR TEETH THE MORNING OF SURGERY WITH YOUR REGULAR TOOTHPASTE   Do NOT smoke after Midnight   Take these medicines the morning of surgery with A SIP OF WATER:   Okay to use inhalers  Pepcid                              You may not have any metal on your body including hair pins, jewelry, and body piercing             Do not wear make-up, lotions, powders, perfumes or deodorant  Do not wear nail polish including gel and S&S, artificial/acrylic nails, or any other type of covering on natural nails including finger and toenails. If you have artificial nails, gel coating, etc. that needs to be removed by a nail salon please have this removed prior to surgery or surgery may need to be canceled/ delayed if the surgeon/  anesthesia feels like they are unable to be safely monitored.   Do not shave  48 hours prior to surgery.    Do not bring valuables to the hospital. [place].   Contacts, dentures or bridgework may not be worn into surgery.   Bring small overnight bag day of surgery.   DO NOT [place]. PHARMACY WILL DISPENSE MEDICATIONS LISTED ON YOUR MEDICATION LIST TO YOU DURING YOUR ADMISSION [place]!    Special Instructions: Bring a copy of your healthcare power of attorney and living will documents the day of surgery if you haven't scanned them before.              Please read over the following fact sheets you were given: IF YOU HAVE QUESTIONS ABOUT YOUR PRE-OP INSTRUCTIONS PLEASE CALL [phone] Gwen  If you received a COVID test during your pre-op visit  it is requested that you wear a mask when out in public, stay  away from anyone that may not be feeling well and notify your surgeon if you develop symptoms. If you test positive for Covid or have been in contact with anyone that has tested positive in the last 10 days please notify you surgeon.  [place] - Preparing for Surgery Before surgery, you can play an important role.  Because skin is not sterile, your skin needs to be as free of germs as possible.  You can reduce the number of germs on your skin by washing with CHG (chlorahexidine gluconate) soap before surgery.  CHG is an antiseptic cleaner which kills germs and bonds with the skin to continue killing germs even after washing. Please DO NOT use if you have an allergy to CHG or antibacterial soaps.  If your skin becomes reddened/irritated stop using the CHG and inform your nurse when you arrive at Short Stay. Do not shave (including legs and underarms) for at least 48 hours prior to the first CHG shower.  You may shave your face/neck.  Please follow these instructions carefully:  1.  Shower with CHG Soap the  night before surgery and the  morning of surgery.  2.  If you choose to wash your hair, wash your hair first as usual with your normal  shampoo.  3.  After you shampoo, rinse your hair and body thoroughly to remove the shampoo.                             4.  Use CHG as you would any other liquid soap.  You can apply chg directly to the skin and wash.  Gently with a scrungie or clean washcloth.  5.  Apply the CHG Soap to your body ONLY FROM THE NECK DOWN.   Do   not use on face/ open                           Wound or open sores. Avoid contact with eyes, ears mouth and   genitals (private parts).                       Wash face,  Genitals (private parts) with your normal soap.             6.  Wash thoroughly, paying special attention to the area where your    surgery  will be performed.  7.  Thoroughly rinse your body with warm water from the neck down.  8.  DO NOT shower/wash with your normal soap after using and rinsing off the CHG Soap.                9.  Pat yourself dry with a clean towel.            10.  Wear clean pajamas.            11.  Place clean sheets on your bed the night of your first shower and do not  sleep with pets. Day of Surgery : Do not apply any lotions/deodorants the morning of surgery.  Please wear clean clothes to the hospital/surgery center.  FAILURE TO FOLLOW THESE INSTRUCTIONS MAY RESULT IN THE CANCELLATION OF YOUR SURGERY  PATIENT SIGNATURE_________________________________  NURSE SIGNATURE__________________________________  ________________________________________________________________________

## 2021-11-28 ENCOUNTER — Other Ambulatory Visit (HOSPITAL_COMMUNITY): Payer: Self-pay | Admitting: Physician Assistant

## 2021-11-28 ENCOUNTER — Other Ambulatory Visit: Payer: Self-pay

## 2021-11-28 ENCOUNTER — Encounter (HOSPITAL_COMMUNITY): Payer: Self-pay

## 2021-11-28 ENCOUNTER — Encounter (HOSPITAL_COMMUNITY)
Admission: RE | Admit: 2021-11-28 | Discharge: 2021-11-28 | Disposition: A | Discharge status: Home / Self Care | Payer: Medicare Other | Source: Ambulatory Visit | Attending: Interventional Radiology | Admitting: Interventional Radiology

## 2021-11-28 VITALS — BP 142/73 | HR 77 | Temp 98.2°F | Resp 16 | Ht 66.0 in | Wt 205.0 lb

## 2021-11-28 DIAGNOSIS — I2510 Atherosclerotic heart disease of native coronary artery without angina pectoris: Secondary | ICD-10-CM | POA: Insufficient documentation

## 2021-11-28 DIAGNOSIS — Z01812 Encounter for preprocedural laboratory examination: Secondary | ICD-10-CM | POA: Insufficient documentation

## 2021-11-28 DIAGNOSIS — Z01818 Encounter for other preprocedural examination: Secondary | ICD-10-CM

## 2021-11-28 HISTORY — DX: Unspecified osteoarthritis, unspecified site: M19.90

## 2021-11-28 HISTORY — DX: Other specified postprocedural states: Z98.890

## 2021-11-28 HISTORY — DX: Nausea with vomiting, unspecified: R11.2

## 2021-11-28 LAB — BASIC METABOLIC PANEL
Anion gap: 7 (ref 5–15)
BUN: 18 mg/dL (ref 8–23)
CO2: 24 mmol/L (ref 22–32)
Calcium: 9.7 mg/dL (ref 8.9–10.3)
Chloride: 109 mmol/L (ref 98–111)
Creatinine, Ser: 0.84 mg/dL (ref 0.44–1.00)
GFR, Estimated: >60 mL/min (ref >60)
Glucose, Bld: 97 mg/dL (ref 70–99)
Potassium: 3.9 mmol/L (ref 3.5–5.1)
Sodium: 140 mmol/L (ref 135–145)

## 2021-11-28 LAB — CBC
HCT: 39.6 % (ref 36.0–46.0)
Hemoglobin: 12.3 g/dL (ref 12.0–15.0)
MCH: 29.1 pg (ref 26.0–34.0)
MCHC: 31.1 g/dL (ref 30.0–36.0)
MCV: 93.6 fL (ref 80.0–100.0)
Platelets: 326 10*3/uL (ref 150–400)
RBC: 4.23 MIL/uL (ref 3.87–5.11)
RDW: 13.2 % (ref 11.5–15.5)
WBC: 7.3 10*3/uL (ref 4.0–10.5)
nRBC: 0 % (ref 0.0–0.2)

## 2021-11-28 NOTE — Progress Notes (Signed)
Surgery orders requested with radiology. 

## 2021-11-28 NOTE — Progress Notes (Signed)
Patient in for PAT visit, called pharmacy x3 with no answer for medicine reconciliation.  Patient was given the number to pharmacy to call when she gets home to go over medicines.

## 2021-12-18 NOTE — Patient Instructions (Signed)
Patient in for PAT visit, called pharmacy x3 with no answer for medicine reconciliation.  Patient was given the number to pharmacy to call when she gets home to go over medicines.  Surgery orders requested with radiology.SURGICAL WAITING ROOM VISITATION Patients having surgery or a procedure may have no more than 2 support people in the waiting area - these visitors may rotate.   Children under the age of 63 must have an adult with them who is not the patient. If the patient needs to stay at the hospital during part of their recovery, the visitor guidelines for inpatient rooms apply. Pre-op nurse will coordinate an appropriate time for 1 support person to accompany patient in pre-op.  This support person may not rotate.    Please refer to the [facility] website for the visitor guidelines for Inpatients (after your surgery is over and you are in a regular room).      Your procedure is scheduled on:  12-20-21   Report to [facility] Main Entrance    Report to admitting at 6:30 AM   Call this number if you have problems the morning of surgery [phone]   Do not eat food or drink liquids :After Midnight.         If you have questions, please contact your surgeon's office.   FOLLOW  ANY ADDITIONAL PRE OP INSTRUCTIONS YOU RECEIVED FROM YOUR SURGEON'S OFFICE!!!     Oral Hygiene is also important to reduce your risk of infection.                                    Remember - BRUSH YOUR TEETH THE MORNING OF SURGERY WITH YOUR REGULAR TOOTHPASTE   Do NOT smoke after Midnight   Take these medicines the morning of surgery with A SIP OF WATER:   Okay to use inhalers  Pepcid                              You may not have any metal on your body including hair pins, jewelry, and body piercing             Do not wear make-up, lotions, powders, perfumes or deodorant  Do not wear nail polish including gel and S&S, artificial/acrylic nails, or any other type of covering on natural nails  including finger and toenails. If you have artificial nails, gel coating, etc. that needs to be removed by a nail salon please have this removed prior to surgery or surgery may need to be canceled/ delayed if the surgeon/ anesthesia feels like they are unable to be safely monitored.   Do not shave  48 hours prior to surgery.    Do not bring valuables to the hospital. [place].   Contacts, dentures or bridgework may not be worn into surgery.   Bring small overnight bag day of surgery.   DO NOT [place]. PHARMACY WILL DISPENSE MEDICATIONS LISTED ON YOUR MEDICATION LIST TO YOU DURING YOUR ADMISSION [place]!    Special Instructions: Bring a copy of your healthcare power of attorney and living will documents the day of surgery if you haven't scanned them before.              Please read over the following fact sheets you were given:  IF YOU HAVE QUESTIONS ABOUT YOUR PRE-OP INSTRUCTIONS PLEASE CALL [phone] Gwen  If you received a COVID test during your pre-op visit  it is requested that you wear a mask when out in public, stay away from anyone that may not be feeling well and notify your surgeon if you develop symptoms. If you test positive for Covid or have been in contact with anyone that has tested positive in the last 10 days please notify you surgeon.   - Preparing for Surgery Before surgery, you can play an important role.  Because skin is not sterile, your skin needs to be as free of germs as possible.  You can reduce the number of germs on your skin by washing with CHG (chlorahexidine gluconate) soap before surgery.  CHG is an antiseptic cleaner which kills germs and bonds with the skin to continue killing germs even after washing. Please DO NOT use if you have an allergy to CHG or antibacterial soaps.  If your skin becomes reddened/irritated stop using the CHG and inform your nurse when you arrive  at Short Stay. Do not shave (including legs and underarms) for at least 48 hours prior to the first CHG shower.  You may shave your face/neck.  Please follow these instructions carefully:  1.  Shower with CHG Soap the night before surgery and the  morning of surgery.  2.  If you choose to wash your hair, wash your hair first as usual with your normal  shampoo.  3.  After you shampoo, rinse your hair and body thoroughly to remove the shampoo.                             4.  Use CHG as you would any other liquid soap.  You can apply chg directly to the skin and wash.  Gently with a scrungie or clean washcloth.  5.  Apply the CHG Soap to your body ONLY FROM THE NECK DOWN.   Do   not use on face/ open                           Wound or open sores. Avoid contact with eyes, ears mouth and   genitals (private parts).                       Wash face,  Genitals (private parts) with your normal soap.             6.  Wash thoroughly, paying special attention to the area where your    surgery  will be performed.  7.  Thoroughly rinse your body with warm water from the neck down.  8.  DO NOT shower/wash with your normal soap after using and rinsing off the CHG Soap.                9.  Pat yourself dry with a clean towel.            10.  Wear clean pajamas.            11.  Place clean sheets on your bed the night of your first shower and do not  sleep with pets. Day of Surgery : Do not apply any lotions/deodorants the morning of surgery.  Please wear clean clothes to the hospital/surgery center.  FAILURE TO FOLLOW THESE INSTRUCTIONS MAY RESULT IN THE CANCELLATION OF YOUR  SURGERY  PATIENT SIGNATURE_________________________________  NURSE SIGNATURE__________________________________  ________________________________________________________________________

## 2021-12-19 ENCOUNTER — Other Ambulatory Visit: Payer: Self-pay | Admitting: Radiology

## 2021-12-19 DIAGNOSIS — N2889 Other specified disorders of kidney and ureter: Secondary | ICD-10-CM

## 2021-12-19 NOTE — Anesthesia Preprocedure Evaluation (Addendum)
Anesthesia Evaluation  Patient identified by MRN, date of birth, ID band  Reviewed: Allergy & Precautions, NPO status , Patient's Chart, lab work & pertinent test results  History of Anesthesia Complications (+) PONV and history of anesthetic complications  Airway Mallampati: II  TM Distance: >3 FB Neck ROM: Full    Dental no notable dental hx. (+) Teeth Intact, Dental Advisory Given   Pulmonary asthma , former smoker   Pulmonary exam normal breath sounds clear to auscultation       Cardiovascular hypertension, Pt. on medications Normal cardiovascular exam Rhythm:Regular Rate:Normal     Neuro/Psych    GI/Hepatic   Endo/Other    Renal/GU L renal mass  Lab Results      Component                Value               Date                      CREATININE               0.84                11/28/2021                 K                        3.9                 11/28/2021                     Musculoskeletal  (+) Arthritis ,    Abdominal   Peds  Hematology Lab Results      Component                Value               Date                           HGB                      12.3                11/28/2021                HCT                      39.6                11/28/2021                  PLT                      326                 11/28/2021              Anesthesia Other Findings All: Thiazide, amdlodipine, Pcn  Reproductive/Obstetrics                             Anesthesia Physical Anesthesia Plan  ASA: 3  Anesthesia Plan: General   Post-op Pain Management:    Induction: Intravenous  PONV Risk Score and Plan: 4 or greater and Treatment  may vary due to age or medical condition, Scopolamine patch - Pre-op, Midazolam, Dexamethasone and Ondansetron  Airway Management Planned: Oral ETT  Additional Equipment: None  Intra-op Plan:   Post-operative Plan: Extubation in OR  Informed  Consent: I have reviewed the patients History and Physical, chart, labs and discussed the procedure including the risks, benefits and alternatives for the proposed anesthesia with the patient or authorized representative who has indicated his/her understanding and acceptance.     Dental advisory given  Plan Discussed with:   Anesthesia Plan Comments:        Anesthesia Quick Evaluation

## 2021-12-19 NOTE — H&P (Deleted)
  The note originally documented on this encounter has been moved the the encounter in which it belongs.  

## 2021-12-19 NOTE — H&P (Signed)
Chief Complaint: Patient was seen in consultation today for left renal mass  Referring Physician(s): Dr. Tasia Catchings  Supervising Physician: Mir, Sharen Heck  Patient Status: [facility] - Out-pt  History of Present Illness: Janet [name] is a 71 y.o. female with a medical history significant for a left renal mass that was initially discovered on a 03/10/21 ultrasound. Subsequent CT on 03/23/21 confirmed a 3 cm solid enhancing left renal mass with no evidence of metastatic disease. After discussing her options with her Urology doctor, she opted for surveillance.   A repeat CT scan of the abdomen/pelvis on 09/23/21 showed further enlargement of the mass.   CT Abdomen/Pelvis 10/13/21 Adrenals/Urinary Tract: Adrenal glands are unremarkable. Interval enlargement of a heterogeneously enhancing, exophytic mass of the peripheral superior pole of the left kidney measuring 3.4 x 2.8 cm, previously 2.9 x 2.3 cm (series 5, image 50). Additional simple, benign right renal cortical cysts, for which no further follow-up or characterization is required. Bladder is unremarkable.  The patient did not want to proceed with surgery and she was referred to IR to discuss alternative treatment options. She met with Dr. Dwaine Gale 11/02/21 and was deemed an appropriate candidate for targeted treatment with cryoablation. The risks and benefits of this procedure were discussed and the patient was in agreement to proceed.    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 Authorizing Provider  albuterol (PROVENTIL HFA;VENTOLIN HFA) 108 (90 BASE) MCG/ACT inhaler Inhale 2 puffs into the lungs every 6 (six) hours as needed for wheezing or shortness of breath. 12/24/14   [name], MD  aspirin EC 81 MG tablet Take 81 mg by mouth daily.    [provider]  Butalbital-APAP-Caffeine 50-300-40 MG CAPS take 1 to 2 capsules by mouth every 6 hours if needed for headache 07/30/17   [provider]  Elastic Bandages & Supports (EMS ANTI-EMBOLISM STOCKINGS) MISC Apply in the morning and remove at night. 06/23/14   [provider]  famotidine (PEPCID) 20 MG tablet Take 1 tablet (20 mg total) by mouth 2 (two) times daily. 08/23/21   Brimage, [name], DO  lidocaine (XYLOCAINE) 2 % solution Use as directed 15 mLs in the mouth or throat every 3 (three) hours as needed for mouth pain. Patient not taking: Reported on 11/02/2021 08/23/21   Lyndee Hensen, DO  lisinopril (ZESTRIL) 20 MG tablet 2 tablets (40 mg total) daily. 10/19/13   [provider]  naproxen (NAPROSYN) 500 MG tablet Take by mouth. 05/15/16   [provider]  olmesartan (BENICAR) 40 MG tablet Take 40 mg by mouth daily. 11/25/21   [provider]  potassium chloride SA (K-DUR,KLOR-CON) 20 MEQ tablet Take 20 mEq by mouth daily.     [provider]  traMADol (ULTRAM) 50 MG tablet Take 1 tablet (50 mg total) by mouth every 6 (six) hours as needed. Patient not taking: Reported on 11/02/2021 08/21/21   Kris Hartmann, NP  linaclotide Rolan Lipa) 145 MCG CAPS capsule Take 1 capsule (145 mcg total) by mouth daily before breakfast. 09/30/19 05/30/20  Lin Landsman, MD      Family History  Problem Relation Age of Onset   Colon cancer Mother 66   Hypertension Mother    Heart attack Father 38   Hypertension Father    Hyperlipidemia Father    Diabetes Father    Heart attack Brother    Heart attack Brother    Heart attack Daughter    Heart Problems Son    Stroke Neg Hx    Breast cancer Neg Hx     Social History   Socioeconomic History   Marital status: Married    Spouse name: Not on file   Number of children: Not on file   Years of education: Not on file   Highest education level: Not on file  Occupational History   Not on file  Tobacco Use   Smoking status: Former    Years: 5.00    Types: Cigarettes    Quit date: 1995    Years since quitting: 28.9    Passive exposure: Past (Long time ago i or 2 cigarettes)   Smokeless tobacco: Never   Tobacco comments:    1 cig daily--quit around 1995  Vaping Use   Vaping Use: Never used  Substance and Sexual Activity   Alcohol use: No   Drug use: No   Sexual activity: Not on file  Other Topics Concern   Not on file  Social History Narrative   Not on file   Social Determinants of Health   Financial Resource Strain: Low Risk  (04/10/2021)   Overall Financial Resource Strain (CARDIA)    Difficulty of Paying Living Expenses: Not hard at all  Food Insecurity: No Food Insecurity (04/10/2021)   Hunger Vital Sign    Worried About Running Out of Food in the Last Year: Never true    [place] in the Last Year: Never true  Transportation Needs: No Transportation Needs (04/10/2021)   PRAPARE - Hydrologist (Medical): No    Lack of Transportation (Non-Medical): No  Physical Activity: Inactive (04/10/2021)   Exercise Vital Sign    Days of Exercise per Week: 0 days    Minutes of Exercise per Session: 0 min  Stress: No Stress Concern Present (04/10/2021)   [place]    Feeling of Stress : Only a little   Social Connections: Socially Integrated (04/10/2021)   Social Connection and Isolation Panel [NHANES]    Frequency of Communication with Friends and Family: Three times a week    Frequency of Social Gatherings with Friends and Family: Three times a week    Attends Religious Services: 1 to 4 times per year    Active Member of Clubs or Organizations: No    Attends Archivist Meetings: 1 to 4 times per year    Marital Status: Married    Review of Systems: A 12 point ROS discussed and pertinent positives are indicated in the HPI above.  All other systems are negative.  Review  of Systems  Constitutional:  Negative for appetite change and fatigue.  Respiratory:  Negative for cough and shortness of breath.   Cardiovascular:  Negative for chest pain and leg swelling.  Gastrointestinal:  Negative for abdominal pain, diarrhea, nausea and vomiting.  Genitourinary:  Negative for flank pain.  Musculoskeletal:  Negative for back pain.  Neurological:  Negative for dizziness and headaches.    Vital Signs: BP (!) 149/75   Pulse 92   Temp 98.9 F (37.2 C) (Oral)   Resp 16   SpO2 97%   Physical Exam Constitutional:      General: She is not in acute distress.    Appearance: She is not ill-appearing.  HENT:     Mouth/Throat:     Mouth: Mucous membranes are moist.     Pharynx: Oropharynx is clear.  Cardiovascular:     Rate and Rhythm: Normal rate and regular rhythm.     Pulses: Normal pulses.     Heart sounds: Normal heart sounds.  Pulmonary:     Effort: Pulmonary effort is normal.     Breath sounds: Normal breath sounds.  Abdominal:     General: Bowel sounds are normal.     Palpations: Abdomen is soft.     Tenderness: There is no abdominal tenderness.  Musculoskeletal:     Right lower leg: Edema present.     Left lower leg: Edema present.     Comments: Signs of poor circulation/venous stasis. Ulcer on the lateral aspect of left calf - covered with a bandage. Patient  complaining of gout in her right foot.   Skin:    General: Skin is warm and dry.  Neurological:     Mental Status: She is alert and oriented to person, place, and time.     Imaging: No results found.  Labs:  CBC: Recent Labs    03/13/21 1154 09/24/21 1510 11/28/21 0914 12/20/21 0735  WBC 6.6 5.4 7.3 7.3  HGB 12.9 12.8 12.3 12.4  HCT 40.3 39.7 39.6 40.0  PLT 297 329 326 322    COAGS: No results for input(s): "INR", "APTT" in the last 8760 hours.  BMP: Recent Labs    03/13/21 1154 09/24/21 1510 11/28/21 0914 12/20/21 0735  NA 136 137 140 138  K 4.0 3.9 3.9 4.9  CL 102 107 109 109  CO2 _0 21*  GLUCOSE 95 106* 97 113*  BUN _1 CALCIUM 9.6 9.7 9.7 9.5  CREATININE 0.87 0.87 0.84 0.83  GFRNONAA >60 >60 >60 >60    LIVER FUNCTION TESTS: Recent Labs    03/13/21 1154  BILITOT 0.3  AST 16  ALT 13  ALKPHOS 47  PROT 7.8  ALBUMIN 3.8    TUMOR MARKERS: No results for input(s): "AFPTM", "CEA", "CA199", "CHROMGRNA" in the last 8760 hours.  Assessment and Plan:  Left Renal Mass: [name], 70 year old female, presents today to the [facility] department for an image-guided left renal mass cryoablation. This procedure will be done under general anesthesia with possible overnight observation.  Risks and benefits of image guided renal cryoablation were discussed with the patient including, but not limited to, failure to treat entire lesion, bleeding, infection, damage to adjacent structures, hematuria, urine leak, decrease in renal function or post procedural neuropathy.  All of the patient's questions were answered and the patient is agreeable to proceed. She has been NPO. She has not taken 81 mg aspirin since 12/14/21.   Consent signed and  in chart.  Thank you for this interesting consult.  I greatly enjoyed meeting Janet [name] and look forward to participating in their care.  A copy of this report was sent to the  requesting provider on this date.  Electronically Signed: Soyla Dryer, AGACNP-BC [phone] 12/20/2021, 8:09 AM   I spent a total of  30 Minutes   in face to face in clinical consultation, greater than 50% of which was counseling/coordinating care for renal mass cryoablation.

## 2021-12-20 ENCOUNTER — Other Ambulatory Visit: Payer: Self-pay

## 2021-12-20 ENCOUNTER — Observation Stay (HOSPITAL_COMMUNITY)
Admission: RE | Admit: 2021-12-20 | Discharge: 2021-12-20 | Disposition: A | Discharge status: Home / Self Care | Payer: Medicare Other | Source: Ambulatory Visit | Attending: Interventional Radiology | Admitting: Interventional Radiology

## 2021-12-20 ENCOUNTER — Encounter (HOSPITAL_COMMUNITY)
Admission: RE | Disposition: A | Discharge status: Home / Self Care | Payer: Self-pay | Source: Ambulatory Visit | Attending: Interventional Radiology

## 2021-12-20 ENCOUNTER — Ambulatory Visit (HOSPITAL_BASED_OUTPATIENT_CLINIC_OR_DEPARTMENT_OTHER): Payer: Medicare Other | Admitting: Anesthesiology

## 2021-12-20 ENCOUNTER — Encounter (HOSPITAL_COMMUNITY): Payer: Self-pay | Admitting: Interventional Radiology

## 2021-12-20 ENCOUNTER — Ambulatory Visit (HOSPITAL_COMMUNITY): Payer: Medicare Other | Admitting: Emergency Medicine

## 2021-12-20 ENCOUNTER — Observation Stay (HOSPITAL_COMMUNITY)
Admission: RE | Admit: 2021-12-20 | Discharge: 2021-12-21 | Disposition: A | Discharge status: Home / Self Care | Payer: Medicare Other | Source: Ambulatory Visit | Attending: Interventional Radiology | Admitting: Interventional Radiology

## 2021-12-20 DIAGNOSIS — D49512 Neoplasm of unspecified behavior of left kidney: Secondary | ICD-10-CM

## 2021-12-20 DIAGNOSIS — Z79899 Other long term (current) drug therapy: Secondary | ICD-10-CM | POA: Diagnosis not present

## 2021-12-20 DIAGNOSIS — I10 Essential (primary) hypertension: Secondary | ICD-10-CM

## 2021-12-20 DIAGNOSIS — N2889 Other specified disorders of kidney and ureter: Secondary | ICD-10-CM | POA: Diagnosis present

## 2021-12-20 DIAGNOSIS — Z7982 Long term (current) use of aspirin: Secondary | ICD-10-CM | POA: Diagnosis not present

## 2021-12-20 DIAGNOSIS — J45909 Unspecified asthma, uncomplicated: Secondary | ICD-10-CM

## 2021-12-20 DIAGNOSIS — Z87891 Personal history of nicotine dependence: Secondary | ICD-10-CM | POA: Insufficient documentation

## 2021-12-20 DIAGNOSIS — Z01818 Encounter for other preprocedural examination: Secondary | ICD-10-CM

## 2021-12-20 DIAGNOSIS — Z85828 Personal history of other malignant neoplasm of skin: Secondary | ICD-10-CM | POA: Insufficient documentation

## 2021-12-20 HISTORY — PX: RADIOLOGY WITH ANESTHESIA: SHX6223

## 2021-12-20 LAB — CBC WITH DIFFERENTIAL/PLATELET
Abs Immature Granulocytes: 0.03 10*3/uL (ref 0.00–0.07)
Basophils Absolute: 0 10*3/uL (ref 0.0–0.1)
Basophils Relative: 0 %
Eosinophils Absolute: 0.1 10*3/uL (ref 0.0–0.5)
Eosinophils Relative: 2 %
HCT: 40 % (ref 36.0–46.0)
Hemoglobin: 12.4 g/dL (ref 12.0–15.0)
Immature Granulocytes: 0 %
Lymphocytes Relative: 27 %
Lymphs Abs: 2 10*3/uL (ref 0.7–4.0)
MCH: 29 pg (ref 26.0–34.0)
MCHC: 31 g/dL (ref 30.0–36.0)
MCV: 93.7 fL (ref 80.0–100.0)
Monocytes Absolute: 0.5 10*3/uL (ref 0.1–1.0)
Monocytes Relative: 7 %
Neutro Abs: 4.7 10*3/uL (ref 1.7–7.7)
Neutrophils Relative %: 64 %
Platelets: 322 10*3/uL (ref 150–400)
RBC: 4.27 MIL/uL (ref 3.87–5.11)
RDW: 13.2 % (ref 11.5–15.5)
WBC: 7.3 10*3/uL (ref 4.0–10.5)
nRBC: 0 % (ref 0.0–0.2)

## 2021-12-20 LAB — TYPE AND SCREEN
ABO/RH(D): O POS
Antibody Screen: NEGATIVE

## 2021-12-20 LAB — BASIC METABOLIC PANEL
Anion gap: 8 (ref 5–15)
BUN: 15 mg/dL (ref 8–23)
CO2: 21 mmol/L — ABNORMAL LOW (ref 22–32)
Calcium: 9.5 mg/dL (ref 8.9–10.3)
Chloride: 109 mmol/L (ref 98–111)
Creatinine, Ser: 0.83 mg/dL (ref 0.44–1.00)
GFR, Estimated: >60 mL/min (ref >60)
Glucose, Bld: 113 mg/dL — ABNORMAL HIGH (ref 70–99)
Potassium: 4.9 mmol/L (ref 3.5–5.1)
Sodium: 138 mmol/L (ref 135–145)

## 2021-12-20 LAB — PROTIME-INR
INR: 1.1 (ref 0.8–1.2)
Prothrombin Time: 14.4 seconds (ref 11.4–15.2)

## 2021-12-20 LAB — ABO/RH: ABO/RH(D): O POS

## 2021-12-20 SURGERY — IR WITH ANESTHESIA
Anesthesia: General | Laterality: Left

## 2021-12-20 MED ORDER — LEVOFLOXACIN IN D5W 500 MG/100ML IV SOLN
500.0000 mg | INTRAVENOUS | Status: AC
  Administered 2021-12-20: 500 mg via INTRAVENOUS
  Filled 2021-12-20: qty 100

## 2021-12-20 MED ORDER — SODIUM CHLORIDE (PF) 0.9 % IJ SOLN
INTRAMUSCULAR | Status: AC
  Filled 2021-12-20: qty 50

## 2021-12-20 MED ORDER — SCOPOLAMINE 1 MG/3DAYS TD PT72
MEDICATED_PATCH | TRANSDERMAL | Status: AC
  Filled 2021-12-20: qty 1

## 2021-12-20 MED ORDER — SUCCINYLCHOLINE CHLORIDE 200 MG/10ML IV SOSY
PREFILLED_SYRINGE | INTRAVENOUS | Status: AC
  Filled 2021-12-20: qty 10

## 2021-12-20 MED ORDER — FENTANYL CITRATE PF 50 MCG/ML IJ SOSY: 25.0000 ug | PREFILLED_SYRINGE | INTRAMUSCULAR | Status: DC | PRN

## 2021-12-20 MED ORDER — ONDANSETRON HCL 4 MG/2ML IJ SOLN
INTRAMUSCULAR | Status: AC
  Filled 2021-12-20: qty 2

## 2021-12-20 MED ORDER — LACTATED RINGERS IV SOLN: INTRAVENOUS | Status: DC

## 2021-12-20 MED ORDER — PROPOFOL 10 MG/ML IV BOLUS
INTRAVENOUS | Status: DC | PRN
  Administered 2021-12-20: 130 mg via INTRAVENOUS

## 2021-12-20 MED ORDER — ALBUTEROL SULFATE (2.5 MG/3ML) 0.083% IN NEBU: 3.0000 mL | INHALATION_SOLUTION | Freq: Four times a day (QID) | RESPIRATORY_TRACT | Status: DC | PRN

## 2021-12-20 MED ORDER — ACETAMINOPHEN 10 MG/ML IV SOLN
INTRAVENOUS | Status: DC | PRN
  Administered 2021-12-20: 1000 mg via INTRAVENOUS

## 2021-12-20 MED ORDER — AMISULPRIDE (ANTIEMETIC) 5 MG/2ML IV SOLN: 10.0000 mg | Freq: Once | INTRAVENOUS | Status: DC | PRN

## 2021-12-20 MED ORDER — FENTANYL CITRATE (PF) 100 MCG/2ML IJ SOLN
INTRAMUSCULAR | Status: AC
  Filled 2021-12-20: qty 2

## 2021-12-20 MED ORDER — METRONIDAZOLE 500 MG/100ML IV SOLN
500.0000 mg | INTRAVENOUS | Status: AC
  Administered 2021-12-20: 500 mg via INTRAVENOUS
  Filled 2021-12-20: qty 100

## 2021-12-20 MED ORDER — ACETAMINOPHEN 10 MG/ML IV SOLN: 1000.0000 mg | Freq: Once | INTRAVENOUS | Status: DC | PRN

## 2021-12-20 MED ORDER — HYDROMORPHONE HCL 2 MG/ML IJ SOLN
INTRAMUSCULAR | Status: AC
  Filled 2021-12-20: qty 1

## 2021-12-20 MED ORDER — ACETAMINOPHEN 10 MG/ML IV SOLN
INTRAVENOUS | Status: AC
  Filled 2021-12-20: qty 100

## 2021-12-20 MED ORDER — PHENYLEPHRINE HCL-NACL 20-0.9 MG/250ML-% IV SOLN
INTRAVENOUS | Status: DC | PRN
  Administered 2021-12-20: 30 ug/min via INTRAVENOUS

## 2021-12-20 MED ORDER — SUGAMMADEX SODIUM 200 MG/2ML IV SOLN
INTRAVENOUS | Status: DC | PRN
  Administered 2021-12-20: 200 mg via INTRAVENOUS

## 2021-12-20 MED ORDER — SODIUM CHLORIDE 0.9 % IV SOLN: INTRAVENOUS | Status: DC

## 2021-12-20 MED ORDER — OXYCODONE HCL 5 MG/5ML PO SOLN: 5.0000 mg | Freq: Once | ORAL | Status: DC | PRN

## 2021-12-20 MED ORDER — FAMOTIDINE 20 MG PO TABS
20.0000 mg | ORAL_TABLET | Freq: Two times a day (BID) | ORAL | Status: DC
  Administered 2021-12-20 – 2021-12-21 (×2): 20 mg via ORAL
  Filled 2021-12-20 (×2): qty 1

## 2021-12-20 MED ORDER — MIDAZOLAM HCL 2 MG/2ML IJ SOLN
INTRAMUSCULAR | Status: AC
  Filled 2021-12-20: qty 2

## 2021-12-20 MED ORDER — ONDANSETRON HCL 4 MG/2ML IJ SOLN: 4.0000 mg | Freq: Once | INTRAMUSCULAR | Status: DC | PRN

## 2021-12-20 MED ORDER — DEXAMETHASONE SODIUM PHOSPHATE 10 MG/ML IJ SOLN
INTRAMUSCULAR | Status: DC | PRN
  Administered 2021-12-20: 10 mg via INTRAVENOUS

## 2021-12-20 MED ORDER — DEXAMETHASONE SODIUM PHOSPHATE 10 MG/ML IJ SOLN
INTRAMUSCULAR | Status: AC
  Filled 2021-12-20: qty 1

## 2021-12-20 MED ORDER — TRAMADOL HCL 50 MG PO TABS
50.0000 mg | ORAL_TABLET | Freq: Four times a day (QID) | ORAL | Status: DC | PRN
  Administered 2021-12-20: 50 mg via ORAL
  Filled 2021-12-20: qty 1

## 2021-12-20 MED ORDER — ROCURONIUM BROMIDE 10 MG/ML (PF) SYRINGE
PREFILLED_SYRINGE | INTRAVENOUS | Status: DC | PRN
  Administered 2021-12-20 (×2): 30 mg via INTRAVENOUS
  Administered 2021-12-20: 70 mg via INTRAVENOUS

## 2021-12-20 MED ORDER — FENTANYL CITRATE (PF) 100 MCG/2ML IJ SOLN
INTRAMUSCULAR | Status: DC | PRN
  Administered 2021-12-20: 100 ug via INTRAVENOUS

## 2021-12-20 MED ORDER — SCOPOLAMINE 1 MG/3DAYS TD PT72
1.0000 | MEDICATED_PATCH | TRANSDERMAL | Status: DC
  Administered 2021-12-20: 1 via TRANSDERMAL

## 2021-12-20 MED ORDER — OXYCODONE HCL 5 MG PO TABS: 5.0000 mg | ORAL_TABLET | Freq: Once | ORAL | Status: DC | PRN

## 2021-12-20 MED ORDER — ROCURONIUM BROMIDE 10 MG/ML (PF) SYRINGE
PREFILLED_SYRINGE | INTRAVENOUS | Status: AC
  Filled 2021-12-20: qty 10

## 2021-12-20 MED ORDER — POTASSIUM CHLORIDE CRYS ER 20 MEQ PO TBCR
20.0000 meq | EXTENDED_RELEASE_TABLET | Freq: Every day | ORAL | Status: DC
  Administered 2021-12-20 – 2021-12-21 (×2): 20 meq via ORAL
  Filled 2021-12-20 (×2): qty 1

## 2021-12-20 MED ORDER — ONDANSETRON HCL 4 MG/2ML IJ SOLN
4.0000 mg | Freq: Four times a day (QID) | INTRAMUSCULAR | Status: DC
  Administered 2021-12-20 – 2021-12-21 (×3): 4 mg via INTRAVENOUS
  Filled 2021-12-20 (×3): qty 2

## 2021-12-20 MED ORDER — MIDAZOLAM HCL 2 MG/2ML IJ SOLN
INTRAMUSCULAR | Status: DC | PRN
  Administered 2021-12-20: 2 mg via INTRAVENOUS

## 2021-12-20 MED ORDER — LIDOCAINE 2% (20 MG/ML) 5 ML SYRINGE
INTRAMUSCULAR | Status: DC | PRN
  Administered 2021-12-20: 100 mg via INTRAVENOUS

## 2021-12-20 MED ORDER — PROPOFOL 10 MG/ML IV BOLUS
INTRAVENOUS | Status: AC
  Filled 2021-12-20: qty 20

## 2021-12-20 MED ORDER — ONDANSETRON HCL 4 MG/2ML IJ SOLN
INTRAMUSCULAR | Status: DC | PRN
  Administered 2021-12-20: 4 mg via INTRAVENOUS

## 2021-12-20 MED ORDER — IOHEXOL 300 MG/ML  SOLN
150.0000 mL | Freq: Once | INTRAMUSCULAR | Status: AC | PRN
  Administered 2021-12-20: 150 mL via INTRAVENOUS

## 2021-12-20 MED ORDER — SODIUM CHLORIDE 0.9 % IV SOLN
INTRAVENOUS | Status: AC
  Filled 2021-12-20: qty 250

## 2021-12-20 NOTE — Procedures (Signed)
Interventional Radiology Procedure Note  Procedure: CT guided left renal mass cryoablation  Indication: Left renal tumor  Findings: Please refer to procedural dictation for full description.  Complications: None  EBL: < 10 mL  Miachel Roux, MD [phone]

## 2021-12-20 NOTE — Anesthesia Postprocedure Evaluation (Signed)
Anesthesia Post Note  Patient: Janet [name]  Procedure(s) Performed: Cryoablation of left renal mass (Left)     Patient location during evaluation: PACU Anesthesia Type: General Level of consciousness: awake and alert Pain management: pain level controlled Vital Signs Assessment: post-procedure vital signs reviewed and stable Respiratory status: spontaneous breathing, nonlabored ventilation, respiratory function stable and patient connected to nasal cannula oxygen Cardiovascular status: blood pressure returned to baseline and stable Postop Assessment: no apparent nausea or vomiting Anesthetic complications: no  No notable events documented.  Last Vitals:  Vitals:   12/20/21 1400 12/20/21 1430  BP: 117/86 139/71  Pulse:  72  Resp: 19 (!) 22  Temp:  36.6 C  SpO2: 100% 95%    Last Pain:  Vitals:   12/20/21 1430  TempSrc: Oral  PainSc:                  Barnet [place]

## 2021-12-20 NOTE — Anesthesia Procedure Notes (Signed)
Procedure Name: Intubation Date/Time: 12/20/2021 8:59 AM  Performed by: Cleda Daub, CRNAPre-anesthesia Checklist: Patient identified, Emergency Drugs available, Suction available and Patient being monitored Patient Re-evaluated:Patient Re-evaluated prior to induction Oxygen Delivery Method: Circle system utilized Preoxygenation: Pre-oxygenation with 100% oxygen Induction Type: IV induction Ventilation: Mask ventilation without difficulty Laryngoscope Size: Mac and 3 Grade View: Grade I Tube type: Oral Tube size: 7.0 mm Number of attempts: 1 Airway Equipment and Method: Stylet and Oral airway Placement Confirmation: ETT inserted through vocal cords under direct vision, positive ETCO2 and breath sounds checked- equal and bilateral Secured at: 21 cm Tube secured with: Tape Dental Injury: Teeth and Oropharynx as per pre-operative assessment

## 2021-12-20 NOTE — Progress Notes (Signed)
Patient seen in PACU following today's renal mass cryoablation with general anesthesia. She is awake/alert and denies any significant pain or discomfort. She has voided and taken some sips of water. No issues with the procedure site. She denies nausea but endorses feeling groggy, dizzy and bloated. She does not feel like she can go home this afternoon. Orders have been placed for her to be admitted for overnight observation. The PACU team is aware.   IR will plan to see the patient in the morning for tentative discharge home at that time.  Soyla Dryer, [place] [phone] 12/20/2021, 3:58 PM

## 2021-12-20 NOTE — Transfer of Care (Signed)
Immediate Anesthesia Transfer of Care Note  Patient: Janet [name]  Procedure(s) Performed: Cryoablation of left renal mass (Left)  Patient Location: PACU  Anesthesia Type:General  Level of Consciousness: awake, alert , oriented, and patient cooperative  Airway & Oxygen Therapy: Patient Spontanous Breathing and Patient connected to face mask oxygen  Post-op Assessment: Report given to RN and Post -op Vital signs reviewed and stable  Post vital signs: Reviewed and stable  Last Vitals:  Vitals Value Taken Time  BP 133/59 12/20/21 1217  Temp    Pulse 73 12/20/21 1218  Resp 21 12/20/21 1218  SpO2 100 % 12/20/21 1218  Vitals shown include unvalidated device data.  Last Pain:  Vitals:   12/20/21 0717  PainSc: 0-No pain         Complications: No notable events documented.

## 2021-12-21 ENCOUNTER — Encounter (HOSPITAL_COMMUNITY): Payer: Self-pay | Admitting: Interventional Radiology

## 2021-12-21 DIAGNOSIS — N2889 Other specified disorders of kidney and ureter: Secondary | ICD-10-CM | POA: Diagnosis not present

## 2021-12-21 MED ORDER — ONDANSETRON HCL 4 MG PO TABS: 4.0000 mg | ORAL_TABLET | Freq: Three times a day (TID) | ORAL | Status: DC | PRN

## 2021-12-21 NOTE — Discharge Summary (Signed)
Patient ID: Janet [name] MRN: [id number] DOB/AGE: 02/04/1950 71 y.o.  Admit date: 12/20/2021 Discharge date: 12/21/2021  Supervising Physician: Mir, Sharen Heck  Patient Status: [facility] - In-pt  Admission Diagnoses: Left Renal Mass   Discharge Diagnoses:  Principal Problem:   Renal mass   Discharged Condition: good  Hospital Course: Patient presented to the [facility] IR department 12/19/21 for an elective left renal mass cryoablation under general anesthesia. The procedure was performed by Dr. Dwaine Gale. Post-procedure the patient felt dizzy, groggy and bloated. She did not feel well enough for discharge home and she was admitted for overnight observation.  She had an uneventful night and is ready for discharge home today. She has ambulated, voided, had something to eat/drink and denies any significant pain/discomfort. Her daughter is at the bedside and will provide transportation home.  Janet [name] will follow up with Dr. Dwaine Gale in one month for a tele-visit. She will have repeat imaging performed in 4 months. An order has been placed for a scheduler from our office to call her with dates/times of her appointments. She knows she can call the clinic with any questions/concerns.   Consults: None  Significant Diagnostic Studies: CT GUIDE TISSUE ABLATION  Result Date: 12/21/2021 INDICATION: 71 year old woman with enlarging, solid, enhancing left renal mass, consistent with renal malignancy presents to IR for CT-guided cryoablation. EXAM: CT-guided cryoablation of left renal mass COMPARISON:  None Available. MEDICATIONS: None ANESTHESIA/SEDATION: General - as administered by the Anesthesia department COMPLICATIONS: None immediate. TECHNIQUE: Informed written consent was obtained from the patient after a thorough discussion of the procedural risks, benefits and alternatives. All questions were addressed. Maximal Sterile Barrier Technique was utilized including caps, mask, sterile gowns, sterile gloves, sterile  drape, hand hygiene and skin antiseptic. A timeout was performed prior to the initiation of the procedure. Patient positioned prone on the procedure table. The left flank skin was prepped and draped in usual fashion. Contrast-enhanced CT was performed to better delineate the left renal mass. Given proximity of the descending colon decision was made to perform hydrodissection. 18 gauge needle was inserted between the mass and left renal mass. Hydrodissection was performed throughout the procedure with dilute contrast, for a total of 1.25 L. Given sufficient margin between the mass and descending colon, decision was made to proceed with cryoablation. Utilizing CT guidance, 3 ice rod CX probes were inserted into the mass and a triangular configuration. Multiple CT scans were performed to confirm appropriate positioning of the cryoablation probes within the mass. Cryoablation was performed for 10 minutes, followed by 8 minute thaw, and an additional 10 minute freeze. 5 minute thaw was performed followed by needle cautery. The 3 ablation probes were carefully removed. CT examination was performed throughout the free cycles to confirm appropriate ice ball formation and document safe distance from the descending colon. Post contrast-enhanced CT documented expected cryoablation changes with no significant residual tumor enhancement. Minimal perinephric hematoma was present. FINDINGS: Left renal mass IMPRESSION: CT-guided cryoablation of left renal malignancy. Electronically Signed   By: Miachel Roux M.D.   On: 12/21/2021 08:25    Treatments: Observation   Discharge Exam: Blood pressure 120/61, pulse 73, temperature 98.1 F (36.7 C), temperature source Oral, resp. rate 18, height '5\' 6"'$  (1.676 m), weight 209 lb (94.8 kg), SpO2 97 %. Physical Exam Constitutional:      General: She is not in acute distress.    Appearance: She is not ill-appearing.  Cardiovascular:     Rate and Rhythm: Normal rate and regular  rhythm.   Pulmonary:     Effort: Pulmonary effort is normal.  Abdominal:     Comments: Left back/flank site is clean/dry and minimally tender.  Skin:    General: Skin is warm and dry.  Neurological:     Mental Status: She is alert and oriented to person, place, and time.     Disposition: Discharge disposition: 01-Home or Self Care       Allergies as of 12/21/2021       Reactions   Amlodipine Swelling   Other reaction(s): Angioedema   Shellfish Allergy Anaphylaxis   Thiazide-type Diuretics Other (See Comments)   Other reaction(s): Other (See Comments)   Penicillins Hives, Other (See Comments)   Has patient had a PCN reaction causing immediate rash, facial/tongue/throat swelling, SOB or lightheadedness with hypotension: no Has patient had a PCN reaction causing severe rash involving mucus membranes or skin necrosis: no Has patient had a PCN reaction that required hospitalization? no Has patient had a PCN reaction occurring within the last 10 years: no If all of the above answers are "NO", then may proceed with Cephalosporin use.        Medication List     TAKE these medications    albuterol 108 (90 Base) MCG/ACT inhaler Commonly known as: VENTOLIN HFA Inhale 2 puffs into the lungs every 6 (six) hours as needed for wheezing or shortness of breath.   ascorbic acid 500 MG tablet Commonly known as: VITAMIN C Take 500 mg by mouth daily.   aspirin EC 81 MG tablet Take 81 mg by mouth daily.   Butalbital-APAP-Caffeine 50-300-40 MG Caps take 1 to 2 capsules by mouth every 6 hours if needed for headache   EMS Anti-Embolism Stockings Misc Apply in the morning and remove at night.   famotidine 20 MG tablet Commonly known as: PEPCID Take 1 tablet (20 mg total) by mouth 2 (two) times daily. What changed:  when to take this reasons to take this   lidocaine 2 % solution Commonly known as: XYLOCAINE Use as directed 15 mLs in the mouth or throat every 3 (three) hours as needed  for mouth pain.   olmesartan 40 MG tablet Commonly known as: BENICAR Take 40 mg by mouth daily.   potassium chloride SA 20 MEQ tablet Commonly known as: KLOR-CON M Take 20 mEq by mouth daily.        Follow-up Information     Mir, Paula Libra, MD Follow up.   Specialties: Interventional Radiology, Diagnostic Radiology, Radiology Why: Please follow up with Dr. Dwaine Gale in one month for a tele-visit. A scheduler from our office will call you with a date/time. Please call our office with any questions/concerns prior to your visit. Contact information: [address] [place] [address] [phone]                  Electronically Signed: Theresa Duty, NP 12/21/2021, 9:28 AM   I have spent Less Than 30 Minutes discharging Janet [name].

## 2021-12-21 NOTE — TOC Progression Note (Signed)
Transition of Care [facility]) - Progression Note    Patient Details  Name: Janet [name] MRN: [id number] Date of Birth: 05/26/1950  Transition of Care [facility]) CM/SW Contact  Servando Snare, [place] Phone Number: 12/21/2021, 10:04 AM  Clinical Narrative:       Transition of Care [facility]) Screening Note   Patient Details  Name: Janet [name] Date of Birth: 1950-09-10   Transition of Care [facility]) CM/SW Contact:    Servando Snare, LCSW Phone Number: 12/21/2021, 10:04 AM    Transition of Care Department [facility]) has reviewed patient and no TOC needs have been identified at this time. We will continue to monitor patient advancement through interdisciplinary progression rounds. If new patient transition needs arise, please place a TOC consult.         Expected Discharge Plan and Services           Expected Discharge Date: 12/21/21                                     Social Determinants of Health (SDOH) Interventions    Readmission Risk Interventions     No data to display

## 2021-12-21 NOTE — Progress Notes (Signed)
[name] to be D/C'd per MD order. Discussed with the patient and all questions fully answered. ? VSS, Skin clean, dry and intact without evidence of skin break down, no evidence of skin tears noted. ? IV catheter discontinued intact. Site without signs and symptoms of complications. Dressing and pressure applied. ? An After Visit Summary was printed and given to the patient. Patient informed where to pickup prescriptions. ? D/c education completed with patient/family including follow up instructions, medication list, d/c activities limitations if indicated, with other d/c instructions as indicated by MD - patient able to verbalize understanding, all questions fully answered.  ? Patient instructed to return to ED, call 911, or call MD for any changes in condition.   All belongings returned including cell phone and clothing. ? Patient to be escorted via [place], and D/C home via private auto.

## 2021-12-22 ENCOUNTER — Ambulatory Visit
Admission: EM | Admit: 2021-12-22 | Discharge: 2021-12-22 | Disposition: A | Discharge status: Home / Self Care | Payer: Medicare Other | Attending: Emergency Medicine | Admitting: Emergency Medicine

## 2021-12-22 DIAGNOSIS — M79674 Pain in right toe(s): Secondary | ICD-10-CM

## 2021-12-22 MED ORDER — METHYLPREDNISOLONE SODIUM SUCC 40 MG IJ SOLR
60.0000 mg | Freq: Once | INTRAMUSCULAR | Status: AC
  Administered 2021-12-22: 60 mg via INTRAMUSCULAR

## 2021-12-22 MED ORDER — PREDNISONE 10 MG (21) PO TBPK: ORAL_TABLET | Freq: Every day | ORAL | 0 refills | Status: DC

## 2021-12-22 NOTE — Discharge Instructions (Signed)
Your symptoms are consistent with gout which is an inflammatory process due to a buildup of uric acid, inside your packet is more information on this condition  You have been given an injection of a steroid today here in the office to help reduce your symptoms  Starting tomorrow take prednisone as directed every morning with food to continue that process  You may use ice or heat over the affected area in 10 to 15-minute intervals  You may continue activity as tolerated  If your symptoms continue to persist or worsen please follow-up for reevaluation

## 2021-12-22 NOTE — ED Provider Notes (Signed)
[facility]    CSN: [id number] Arrival date & time: 12/22/21  1732      History   Chief Complaint No chief complaint on file.   HPI Janet [name] is a 71 y.o. female.   Patient presents with erythema, pain and swelling of the right great toe beginning 2 days ago.  Symptoms have been constant.  Worsen when bearing weight.  Unable to complete range of motion.  Denies numbness or tingling.  Has attempted use of Tylenol which has been ineffective.  Believes that she has had gout in the past but has not had a flare for many years.  Denies injury or trauma to the site.   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   Left hip pain 11/01/2017   Headache disorder 08/29/2017   Premature ventricular contractions 05/25/2016   Asthma in adult without complication 66/44/0347   Heart palpitations 04/23/2016   History of kidney cancer 04/23/2016   Bilateral cellulitis of lower leg 10/03/2015   Renal mass, left 02/24/2015   Thrombophlebitis 09/29/2014   Chest pressure 09/12/2014   Dyspnea on exertion 09/12/2014   Generalized weakness 09/09/2014   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    Past Surgical History:  Procedure Laterality Date   ABDOMINAL HYSTERECTOMY     COLONOSCOPY WITH PROPOFOL N/A 05/25/2019   Procedure: COLONOSCOPY WITH PROPOFOL;  Surgeon: Lin Landsman, MD;  Location: [facility] ENDOSCOPY;  Service: Gastroenterology;  Laterality: N/A;    IR RADIOLOGIST EVAL & MGMT  11/02/2021   RADIOLOGY WITH ANESTHESIA Left 12/20/2021   Procedure: Cryoablation of left renal mass;  Surgeon: Mir, Paula Libra, MD;  Location: [facility] ORS;  Service: Radiology;  Laterality: Left;   VARICOSE VEIN SURGERY      OB History     Gravida  3   Para      Term      Preterm      AB      Living  2      SAB      IAB      Ectopic      Multiple      Live Births               Home Medications    Prior to Admission medications   Medication Sig Start Date End Date Taking? Authorizing Provider  albuterol (PROVENTIL HFA;VENTOLIN HFA) 108 (90 BASE) MCG/ACT inhaler Inhale 2 puffs into the lungs every 6 (six) hours as needed for wheezing or shortness of breath. 12/24/14   [name], MD  ascorbic acid (VITAMIN C) 500 MG tablet Take 500 mg by mouth daily.    [provider]  aspirin EC 81 MG tablet Take 81 mg by mouth daily.    [provider]  Butalbital-APAP-Caffeine 50-300-40 MG CAPS take 1 to 2  capsules by mouth every 6 hours if needed for headache 07/30/17   [provider]  Elastic Bandages & Supports (EMS ANTI-EMBOLISM STOCKINGS) MISC Apply in the morning and remove at night. 06/23/14   [provider]  famotidine (PEPCID) 20 MG tablet Take 1 tablet (20 mg total) by mouth 2 (two) times daily. Patient taking differently: Take 20 mg by mouth 2 (two) times daily as needed for heartburn or indigestion. 08/23/21   Brimage, [name], DO  lidocaine (XYLOCAINE) 2 % solution Use as directed 15 mLs in the mouth or throat every 3 (three) hours as needed for mouth pain. 08/23/21   Brimage, [name], DO  olmesartan (BENICAR) 40 MG tablet Take 40 mg by mouth daily. 11/25/21   [provider]  potassium chloride SA (K-DUR,KLOR-CON) 20 MEQ tablet Take 20 mEq by mouth daily.     [provider]  linaclotide Rolan Lipa) 145 MCG CAPS capsule Take 1 capsule (145 mcg total) by mouth daily before breakfast. 09/30/19  05/30/20  Lin Landsman, MD    Family History Family History  Problem Relation Age of Onset   Colon cancer Mother 70   Hypertension Mother    Heart attack Father 34   Hypertension Father    Hyperlipidemia Father    Diabetes Father    Heart attack Brother    Heart attack Brother    Heart attack Daughter    Heart Problems Son    Stroke Neg Hx    Breast cancer Neg Hx     Social History Social History   Tobacco Use   Smoking status: Former    Years: 5.00    Types: Cigarettes    Quit date: 1995    Years since quitting: 28.9    Passive exposure: Past (Long time ago i or 2 cigarettes)   Smokeless tobacco: Never   Tobacco comments:    1 cig daily--quit around 1995  Vaping Use   Vaping Use: Never used  Substance Use Topics   Alcohol use: No   Drug use: No     Allergies   Amlodipine, Shellfish allergy, Thiazide-type diuretics, and Penicillins   Review of Systems Review of Systems   Physical Exam Triage Vital Signs ED Triage Vitals  Enc Vitals Group     BP 12/22/21 1744 (!) 140/67     Pulse Rate 12/22/21 1744 79     Resp 12/22/21 1744 20     Temp 12/22/21 1744 98.4 F (36.9 C)     Temp Source 12/22/21 1744 Oral     SpO2 12/22/21 1744 98 %     Weight --      Height --      Head Circumference --      Peak Flow --      Pain Score 12/22/21 1750 8     Pain Loc --      Pain Edu? --      Excl. in [place]? --    No data found.  Updated Vital Signs BP (!) 140/67 (BP Location: Right Arm)   Pulse 79   Temp 98.4 F (36.9 C) (Oral)   Resp 20   SpO2 98%   Visual Acuity Right Eye Distance:   Left Eye Distance:   Bilateral Distance:    Right Eye Near:   Left Eye Near:    Bilateral Near:     Physical Exam Constitutional:      Appearance: Normal appearance.  Eyes:     Extraocular Movements: Extraocular movements intact.  Pulmonary:     Effort: Pulmonary effort is normal.  Musculoskeletal:     Comments: Moderate to severe swelling, erythema and  tenderness present to the base of the right great toe, able to bear weight, range of motion limited unable to flex, sensation intact, capillary refill less than 3, 2+ pedal pulse  Neurological:     Mental Status: She is alert and oriented to person, place, and time. Mental status is at baseline.      UC Treatments / Results  Labs (all labs ordered are listed, but only abnormal results are displayed) Labs Reviewed - No data to display  EKG   Radiology No results found.  Procedures Procedures (including critical care time)  Medications Ordered in UC Medications - No data to display  Initial Impression / Assessment and Plan / UC Course  I have reviewed the triage vital signs and the nursing notes.  Pertinent labs & imaging results that were available during my care of the patient were reviewed by me and considered in my medical decision making (see chart for details).  Right great toe pain  Presentation and symptomology is consistent with gout, discussed with patient, prednisone injection given in office and prednisone course prescribed for outpatient use, may use over-the-counter analgesics, ice and heat for additional supportive care, may follow-up with urgent care or primary doctor if symptoms persist or worsen Final Clinical Impressions(s) / UC Diagnoses   Final diagnoses:  None   Discharge Instructions   None    ED Prescriptions   None    PDMP not reviewed this encounter.   Hans Eden, NP 12/22/21 1824

## 2021-12-22 NOTE — ED Triage Notes (Signed)
Pt reports her right foot is bothering her. She thinks she has gout. Pain started yesterday. It feels like its throbbing and can't bend her big toe and ore red and swollen. Took tylenol but no relief.   Hurts worst with walking

## 2022-01-02 ENCOUNTER — Ambulatory Visit
Admission: EM | Admit: 2022-01-02 | Discharge: 2022-01-02 | Disposition: A | Discharge status: Home / Self Care | Payer: Medicare Other | Attending: Family Medicine | Admitting: Family Medicine

## 2022-01-02 ENCOUNTER — Ambulatory Visit (INDEPENDENT_AMBULATORY_CARE_PROVIDER_SITE_OTHER): Payer: Medicare Other

## 2022-01-02 DIAGNOSIS — R42 Dizziness and giddiness: Secondary | ICD-10-CM | POA: Insufficient documentation

## 2022-01-02 LAB — CBC WITH DIFFERENTIAL/PLATELET
Abs Immature Granulocytes: 0.1 10*3/uL — ABNORMAL HIGH (ref 0.00–0.07)
Basophils Absolute: 0 10*3/uL (ref 0.0–0.1)
Basophils Relative: 0 %
Eosinophils Absolute: 0.3 10*3/uL (ref 0.0–0.5)
Eosinophils Relative: 3 %
HCT: 40.4 % (ref 36.0–46.0)
Hemoglobin: 12.9 g/dL (ref 12.0–15.0)
Immature Granulocytes: 1 %
Lymphocytes Relative: 28 %
Lymphs Abs: 2.6 10*3/uL (ref 0.7–4.0)
MCH: 29.4 pg (ref 26.0–34.0)
MCHC: 31.9 g/dL (ref 30.0–36.0)
MCV: 92 fL (ref 80.0–100.0)
Monocytes Absolute: 0.5 10*3/uL (ref 0.1–1.0)
Monocytes Relative: 6 %
Neutro Abs: 5.8 10*3/uL (ref 1.7–7.7)
Neutrophils Relative %: 62 %
Platelets: 277 10*3/uL (ref 150–400)
RBC: 4.39 MIL/uL (ref 3.87–5.11)
RDW: 13.3 % (ref 11.5–15.5)
WBC: 9.3 10*3/uL (ref 4.0–10.5)
nRBC: 0 % (ref 0.0–0.2)

## 2022-01-02 LAB — BASIC METABOLIC PANEL
Anion gap: 9 (ref 5–15)
BUN: 15 mg/dL (ref 8–23)
CO2: 24 mmol/L (ref 22–32)
Calcium: 9.2 mg/dL (ref 8.9–10.3)
Chloride: 103 mmol/L (ref 98–111)
Creatinine, Ser: 0.83 mg/dL (ref 0.44–1.00)
GFR, Estimated: >60 mL/min (ref >60)
Glucose, Bld: 98 mg/dL (ref 70–99)
Potassium: 3.6 mmol/L (ref 3.5–5.1)
Sodium: 136 mmol/L (ref 135–145)

## 2022-01-02 NOTE — ED Triage Notes (Signed)
Patient presents to UC for dizziness, weakness x 4 months. Pt states she was evaluated by her PCP and prescribed meclizine. States no changes in her symptoms. She also reports she was seen here as well. Continues to experience the dizziness and some days she feels like her legs are weak.

## 2022-01-02 NOTE — Discharge Instructions (Addendum)
CT scan of your head did not show any masses or acute issues that would be the cause of your dizziness.  Work did not show any cause of dizziness.  I sent a referral to an ear nose and throat doctor who can better evaluate your dizziness.  You can discontinue using the meclizine as it is not helping.  Go to ED for red flag symptoms, including; fevers you cannot reduce with Tylenol/Motrin, severe headaches, vision changes, numbness/weakness in part of the body, lethargy, confusion, intractable vomiting, severe dehydration, chest pain, breathing difficulty, severe persistent abdominal or pelvic pain, signs of severe infection (increased redness, swelling of an area), feeling faint or passing out, dizziness, etc. You should especially go to the ED for sudden acute worsening of condition if you do not elect to go at this time.

## 2022-01-02 NOTE — ED Provider Notes (Signed)
[facility]    CSN: [id number] Arrival date & time: 01/02/22  1741      History   Chief Complaint Chief Complaint  Patient presents with   Dizziness   Extremity Weakness    HPI Janet [name] is a 71 y.o. female.   HPI   Janet [name] presents for intermittently is getting dizzy headed.  Feels like her eyes are seeing funny and then she feels a weird feeling in the front of her head.  This symptom can occur at any time.  Head movement does not specifically bring it on.  She has not tried any new medications or supplements recently.  She has been eating and drinking well.  She has not had any vomiting or nausea. No chest pain and shortness of breath.  She does not use a fireplace or have gas heating.  She saw her PCP who started her on meclizine but this is not helping.  The dizziness still occurs at random times.  It does not typically occur with bending over.        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   Left hip pain 11/01/2017   Headache disorder 08/29/2017   Premature ventricular contractions 05/25/2016   Asthma in adult without complication 98/33/8250   Heart palpitations 04/23/2016   History of kidney cancer 04/23/2016   Bilateral cellulitis of lower leg 10/03/2015   Renal mass, left 02/24/2015   Thrombophlebitis 09/29/2014   Chest pressure 09/12/2014   Dyspnea on exertion 09/12/2014   Generalized weakness 09/09/2014    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    Past Surgical History:  Procedure Laterality Date   ABDOMINAL HYSTERECTOMY     COLONOSCOPY WITH PROPOFOL N/A 05/25/2019   Procedure: COLONOSCOPY WITH PROPOFOL;  Surgeon: Lin Landsman, MD;  Location: [facility] ENDOSCOPY;  Service: Gastroenterology;  Laterality: N/A;   IR RADIOLOGIST EVAL & MGMT  11/02/2021   RADIOLOGY WITH ANESTHESIA Left 12/20/2021   Procedure: Cryoablation of left renal mass;  Surgeon: Mir, Paula Libra, MD;  Location: [facility] ORS;  Service: Radiology;  Laterality: Left;   VARICOSE VEIN SURGERY      OB History     Gravida  3   Para      Term      Preterm      AB      Living  2      SAB      IAB      Ectopic      Multiple      Live Births               Home Medications    Prior to Admission medications   Medication Sig Start Date End Date Taking? Authorizing Provider  albuterol (PROVENTIL HFA;VENTOLIN HFA) 108 (90 BASE) MCG/ACT inhaler Inhale 2 puffs into the  lungs every 6 (six) hours as needed for wheezing or shortness of breath. 12/24/14   [name], MD  ascorbic acid (VITAMIN C) 500 MG tablet Take 500 mg by mouth daily.    [provider]  aspirin EC 81 MG tablet Take 81 mg by mouth daily.    [provider]  Butalbital-APAP-Caffeine 50-300-40 MG CAPS take 1 to 2 capsules by mouth every 6 hours if needed for headache 07/30/17   [provider]  Elastic Bandages & Supports (EMS ANTI-EMBOLISM STOCKINGS) MISC Apply in the morning and remove at night. 06/23/14   [provider]  famotidine (PEPCID) 20 MG tablet Take 1 tablet (20 mg total) by mouth 2 (two) times daily. Patient taking differently: Take 20 mg by mouth 2 (two) times daily as needed for heartburn or indigestion. 08/23/21   Divina Neale, [name], DO  lidocaine (XYLOCAINE) 2 % solution Use as directed 15 mLs in the mouth or throat every 3 (three) hours as needed for mouth pain. 08/23/21   Roselind Klus, [name], DO  olmesartan  (BENICAR) 40 MG tablet Take 40 mg by mouth daily. 11/25/21   [provider]  potassium chloride SA (K-DUR,KLOR-CON) 20 MEQ tablet Take 20 mEq by mouth daily.     [provider]  predniSONE (STERAPRED UNI-PAK 21 TAB) 10 MG (21) TBPK tablet Take by mouth daily. Take 6 tabs by mouth daily  for 1 days, then 5 tabs for  days, then 4 tabs for 1 days, then 3 tabs for 1 days, 2 tabs for 1 days, then 1 tab by mouth daily for 1 days 12/22/21   Hans Eden, NP  linaclotide Rolan Lipa) 145 MCG CAPS capsule Take 1 capsule (145 mcg total) by mouth daily before breakfast. 09/30/19 05/30/20  Lin Landsman, MD    Family History Family History  Problem Relation Age of Onset   Colon cancer Mother 77   Hypertension Mother    Heart attack Father 69   Hypertension Father    Hyperlipidemia Father    Diabetes Father    Heart attack Brother    Heart attack Brother    Heart attack Daughter    Heart Problems Son    Stroke Neg Hx    Breast cancer Neg Hx     Social History Social History   Tobacco Use   Smoking status: Former    Years: 5.00    Types: Cigarettes    Quit date: 1995    Years since quitting: 28.9    Passive exposure: Past (Long time ago i or 2 cigarettes)   Smokeless tobacco: Never   Tobacco comments:    1 cig daily--quit around 1995  Vaping Use   Vaping Use: Never used  Substance Use Topics   Alcohol use: No   Drug use: No     Allergies   Amlodipine, Shellfish allergy, Thiazide-type diuretics, and Penicillins   Review of Systems Review of Systems: negative unless otherwise stated in HPI.      Physical Exam Triage Vital Signs ED Triage Vitals  Enc Vitals Group     BP 01/02/22 1855 136/84     Pulse Rate 01/02/22 1855 70     Resp 01/02/22 1855 16     Temp 01/02/22 1855 98.5 F (36.9 C)     Temp Source 01/02/22 1855 Oral     SpO2 01/02/22 1855 99 %     Weight --      Height --      Head Circumference --  Peak Flow --      Pain Score  01/02/22 1854 0     Pain Loc --      Pain Edu? --      Excl. in [place]? --    Orthostatic VS for the past 24 hrs:  BP- Lying Pulse- Lying BP- Sitting Pulse- Sitting  01/02/22 1901 142/63 68 143/71 75    Updated Vital Signs BP 136/84 (BP Location: Left Arm)   Pulse 70   Temp 98.5 F (36.9 C) (Oral)   Resp 16   SpO2 99%   Visual Acuity Right Eye Distance:   Left Eye Distance:   Bilateral Distance:    Right Eye Near:   Left Eye Near:    Bilateral Near:     Physical Exam GEN: alert, well appearing female, in no acute distress, smiles during exam          HENT:  mucus membranes moist, nares patent, no nasal discharge, palpable hard mass in her left cheek EYES:   pupils equal and reactive, EOM intact NECK:  supple, normal ROM, no lymphadenopathy  RESP:  clear to auscultation bilaterally, no increased work of breathing  CVS:     regular rate and rhythm, no murmur, distal pulses intact   EXT:     normal ROM, atraumatic, no edema  NEURO:  alert, oriented, speech normal, CN 2-12 grossly intact, no facial droop,  sensation grossly intact, strength 5/5 bilateral UE and LE, normal coordination, normal finger to nose, normal heel to shin, Negative Romberg, normal gait Skin:    warm and dry, no rash, normal skin turgor Psych: Normal affect, appropriate speech and behavior       UC Treatments / Results  Labs (all labs ordered are listed, but only abnormal results are displayed) Labs Reviewed  CBC WITH DIFFERENTIAL/PLATELET - Abnormal; Notable for the following components:      Result Value   Abs Immature Granulocytes 0.10 (*)    All other components within normal limits  BASIC METABOLIC PANEL    EKG   Radiology CT Head Wo Contrast  Result Date: 01/02/2022 CLINICAL DATA:  Neuro deficit.  Rule out CNS neoplasm. EXAM: CT HEAD WITHOUT CONTRAST TECHNIQUE: Contiguous axial images were obtained from the base of the skull through the vertex without intravenous contrast. RADIATION  DOSE REDUCTION: This exam was performed according to the departmental dose-optimization program which includes automated exposure control, adjustment of the mA and/or kV according to patient size and/or use of iterative reconstruction technique. COMPARISON:  CT head 02/22/2020. FINDINGS: Brain: No evidence of acute infarction, hemorrhage, hydrocephalus, extra-axial collection or mass lesion/mass effect. Vascular: Negative for hyperdense vessel Skull: Negative Sinuses/Orbits: Paranasal sinuses clear.  Negative orbit Other: None IMPRESSION: Negative CT head. Electronically Signed   By: Franchot Gallo M.D.   On: 01/02/2022 19:45    Procedures Procedures (including critical care time)  Medications Ordered in UC Medications - No data to display  Initial Impression / Assessment and Plan / UC Course  I have reviewed the triage vital signs and the nursing notes.  Pertinent labs & imaging results that were available during my care of the patient were reviewed by me and considered in my medical decision making (see chart for details).       Patient is a 71 y.o. female  who presents for intermittent dizziness for the past 3 months.  Overall patient is nontoxic-appearing and afebrile.  Vital signs stable. She is afebrile.  BMP with normal glucose and no  significant electrolyte abnormalities.  CBC without anemia or leukocytosis.  She spoke about her husband dying back in October so I wonder if this may be stress-induced.  But she also states that her husband also had dizziness.  They do not have gas or furnace heating.  Unlikely to be carbon monoxide related.  She is well-appearing and well-hydrated without respiratory distress.  Neurological and cardiopulmonary exams are unremarkable.  Previous workup included an EKG that did not show any acute ST or T wave changes. Has no recent chest pain or shortness of breath.  EKG deferred at this time.  I do not see any head imaging in her chart.  CT head obtained to  evaluate for any mass or bleeding.  CT head was unremarkable.  Discussed neurovestibular rehab and referral to ENT.  She is agreeable referral to ENT.  Referral placed.  Advised her to stop taking the meclizine as this can also cause dizziness along with the fact that it is not helping her symptoms.    ED and return precautions given and patient voiced understanding. Discussed MDM, treatment plan and plan for follow-up with patient/parent who agrees with plan.    Final Clinical Impressions(s) / UC Diagnoses   Final diagnoses:  Dizziness     Discharge Instructions      CT scan of your head did not show any masses or acute issues that would be the cause of your dizziness.  Work did not show any cause of dizziness.  I sent a referral to an ear nose and throat doctor who can better evaluate your dizziness.  You can discontinue using the meclizine as it is not helping.  Go to ED for red flag symptoms, including; fevers you cannot reduce with Tylenol/Motrin, severe headaches, vision changes, numbness/weakness in part of the body, lethargy, confusion, intractable vomiting, severe dehydration, chest pain, breathing difficulty, severe persistent abdominal or pelvic pain, signs of severe infection (increased redness, swelling of an area), feeling faint or passing out, dizziness, etc. You should especially go to the ED for sudden acute worsening of condition if you do not elect to go at this time.       ED Prescriptions   None    PDMP not reviewed this encounter.   Lyndee Hensen, DO 01/02/22 2225

## 2022-02-24 ENCOUNTER — Ambulatory Visit
Admission: RE | Admit: 2022-02-24 | Discharge: 2022-02-24 | Disposition: A | Discharge status: Home / Self Care | Payer: Medicare Other | Source: Ambulatory Visit | Attending: Family Medicine | Admitting: Family Medicine

## 2022-02-24 ENCOUNTER — Ambulatory Visit
Admission: EM | Admit: 2022-02-24 | Discharge: 2022-02-24 | Disposition: A | Discharge status: Home / Self Care | Payer: Medicare Other | Attending: Family Medicine | Admitting: Family Medicine

## 2022-02-24 ENCOUNTER — Other Ambulatory Visit: Payer: Self-pay

## 2022-02-24 DIAGNOSIS — M7989 Other specified soft tissue disorders: Secondary | ICD-10-CM

## 2022-02-24 DIAGNOSIS — M79662 Pain in left lower leg: Secondary | ICD-10-CM

## 2022-02-24 NOTE — Discharge Instructions (Addendum)
Go through the [facility] to check in through radiology for your ultrasound today.    Go to ED for red flag symptoms, including; fevers you cannot reduce with Tylenol/Motrin, severe headaches, vision changes, numbness/weakness in part of the body, lethargy, confusion, intractable vomiting, severe dehydration, chest pain, breathing difficulty, severe persistent abdominal or pelvic pain, signs of severe infection (increased redness, swelling of an area), feeling faint or passing out, dizziness, etc. You should especially go to the ED for sudden acute worsening of condition if you do not elect to go at this time.

## 2022-02-24 NOTE — ED Provider Notes (Signed)
[facility]    CSN: [id number] Arrival date & time: 02/24/22  1532      History   Chief Complaint Chief Complaint  Patient presents with   Leg Pain    HPI Janet [name] is a 72 y.o. female.   HPI   Janet [name] presents for left lower leg swelling and pain since Thursday.  Reports having to take a blood thinner in the past for a blood clot in leg.  She has had to have surgery in the same leg.  She is concerned that she has another blood clot.  The area is warm.  She is unsure if the area is red.  She has history of multiple broken blood vessels.     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   Left hip pain 11/01/2017   Headache disorder 08/29/2017   Premature ventricular contractions 05/25/2016   Asthma in adult without complication [id number]   Heart palpitations 04/23/2016   History of kidney cancer 04/23/2016   Bilateral cellulitis of lower leg 10/03/2015   Renal mass, left 02/24/2015   Thrombophlebitis 09/29/2014   Chest pressure 09/12/2014   Dyspnea on exertion 09/12/2014   Generalized weakness 09/09/2014   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    Past Surgical History:  Procedure Laterality Date   ABDOMINAL HYSTERECTOMY     COLONOSCOPY WITH PROPOFOL N/A 05/25/2019   Procedure: COLONOSCOPY WITH PROPOFOL;  Surgeon: Lin Landsman, MD;  Location: [facility] ENDOSCOPY;  Service: Gastroenterology;  Laterality: N/A;   IR RADIOLOGIST  EVAL & MGMT  11/02/2021   RADIOLOGY WITH ANESTHESIA Left 12/20/2021   Procedure: Cryoablation of left renal mass;  Surgeon: Mir, Paula Libra, MD;  Location: [facility] ORS;  Service: Radiology;  Laterality: Left;   VARICOSE VEIN SURGERY      OB History     Gravida  3   Para      Term      Preterm      AB      Living  2      SAB      IAB      Ectopic      Multiple      Live Births               Home Medications    Prior to Admission medications   Medication Sig Start Date End Date Taking? Authorizing Provider  albuterol (PROVENTIL HFA;VENTOLIN HFA) 108 (90 BASE) MCG/ACT inhaler Inhale 2 puffs into the lungs every 6 (six) hours as needed for wheezing or shortness of breath. 12/24/14   [name], MD  ascorbic acid (VITAMIN C) 500 MG tablet Take 500 mg by mouth daily.    [provider]  aspirin EC 81 MG tablet Take 81 mg by mouth daily.    [provider]  Butalbital-APAP-Caffeine 50-300-40 MG CAPS take 1 to 2 capsules by mouth every 6 hours if needed for headache 07/30/17   [provider]  Elastic Bandages & Supports (EMS ANTI-EMBOLISM STOCKINGS) MISC Apply in the morning and remove at night. 06/23/14   [provider]  famotidine (PEPCID) 20 MG tablet Take 1 tablet (20 mg total) by mouth 2 (two) times daily. Patient taking differently: Take 20 mg by mouth 2 (two) times daily as needed for heartburn or indigestion. 08/23/21   Teresia Myint, [name], DO  lidocaine (XYLOCAINE) 2 % solution Use as directed 15 mLs in the mouth or throat every 3 (three) hours as needed for mouth pain. 08/23/21   Raeana Blinn, [name], DO  olmesartan (BENICAR) 40 MG tablet Take 40 mg by mouth daily. 11/25/21   [provider]  potassium chloride SA (K-DUR,KLOR-CON) 20 MEQ tablet Take 20 mEq by mouth daily.     [provider]  predniSONE (STERAPRED UNI-PAK 21 TAB) 10 MG (21) TBPK tablet Take by mouth daily. Take 6 tabs by mouth daily  for 1 days, then 5 tabs  for  days, then 4 tabs for 1 days, then 3 tabs for 1 days, 2 tabs for 1 days, then 1 tab by mouth daily for 1 days 12/22/21   Hans Eden, NP  linaclotide Rolan Lipa) 145 MCG CAPS capsule Take 1 capsule (145 mcg total) by mouth daily before breakfast. 09/30/19 05/30/20  Lin Landsman, MD    Family History Family History  Problem Relation Age of Onset   Colon cancer Mother 63   Hypertension Mother    Heart attack Father 50   Hypertension Father    Hyperlipidemia Father    Diabetes Father    Heart attack Brother    Heart attack Brother    Heart attack Daughter    Heart Problems Son    Stroke Neg Hx    Breast cancer Neg Hx     Social History Social History   Tobacco Use   Smoking status: Former    Years: 5.00    Types: Cigarettes    Quit date: 1995    Years since quitting: 29.1    Passive exposure: Past (Long time ago i or 2 cigarettes)   Smokeless tobacco: Never   Tobacco comments:    1 cig daily--quit around 1995  Vaping Use   Vaping Use: Never used  Substance Use Topics   Alcohol use: No   Drug use: No     Allergies   Amlodipine, Shellfish allergy, Thiazide-type diuretics, and Penicillins   Review of Systems Review of Systems: negative unless otherwise stated in HPI.      Physical Exam Triage Vital Signs ED Triage Vitals  Enc Vitals Group     BP 02/24/22 1544 119/76     Pulse Rate 02/24/22 1544 84     Resp 02/24/22 1544 17     Temp 02/24/22 1544 99.2 F (37.3 C)     Temp Source 02/24/22 1544 Oral     SpO2 02/24/22 1544 100 %     Weight 02/24/22 1540 197 lb (89.4 kg)     Height 02/24/22 1540 5' 9"$  (1.753 m)     Head Circumference --      Peak Flow --      Pain Score 02/24/22 1540 6     Pain Loc --      Pain Edu? --      Excl. in [place]? --    No data found.  Updated Vital Signs BP 119/76 (BP Location: Left Arm)   Pulse 84   Temp 99.2 F (37.3 C) (Oral)   Resp 17   Ht 5' 9"$  (1.753 m)   Wt 89.4 kg   SpO2 100%   BMI 29.09 kg/m    Visual Acuity Right Eye Distance:   Left Eye Distance:   Bilateral Distance:    Right Eye Near:   Left Eye Near:    Bilateral Near:     Physical Exam GEN: pleasant well appearing elderly female, in no acute distress  CV: regular rate and rhythm RESP: no increased work of breathing, clear to ascultation bilaterally MSK: b/l  woody edema but increased on the left edema, +calf tenderness on the left, palpable mass on the left medial lower leg near the ankle with warmth and tenderness to palpation  SKIN: warm, dry, scarring from what appears to venous stasis dermatitis BLE, see MSK above  NEURO: alert, moves all extremities appropriately      UC Treatments / Results  Labs (all labs ordered are listed, but only abnormal results are displayed) Labs Reviewed - No data to display  EKG   Radiology No results found.  Procedures Procedures (including critical care time)  Medications Ordered in UC Medications - No data to display  Initial Impression / Assessment and Plan / UC Course  I have reviewed the triage vital signs and the nursing notes.  Pertinent labs & imaging results that were available during my care of the patient were reviewed by me and considered in my medical decision making (see chart for details).       Patient is a 72 y.o. female  who presents for acute left lower leg swelling and pain for the past 2 to 3 days.  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  Overall patient is nontoxic-appearing and afebrile.  Vital signs stable.  She does have some with an elevated temperature of 99.2 F.  I am concerned that she may be having a acute blood clot given her history.  Recommended evaluation for DVT with an ultrasound and she is agreeable.  Orders placed.  She may have acute phlebitis versus cellulitis.  Patient is stable to travel to [facility] medical mall for ultrasound imaging of her left lower leg to rule out a DVT.  ED and  return precautions given and patient/guardian voiced understanding. Discussed MDM, treatment plan and plan for follow-up with patient who agrees with plan.    Final Clinical Impressions(s) / UC Diagnoses   Final diagnoses:  Pain in left lower leg  Swelling of left lower extremity     Discharge Instructions      Go through the [place] [facility] to check in through radiology for your ultrasound today.    Go to ED for red flag symptoms, including; fevers you cannot reduce with Tylenol/Motrin, severe headaches, vision changes, numbness/weakness in part of the body, lethargy, confusion, intractable vomiting, severe dehydration, chest pain, breathing difficulty, severe persistent abdominal or pelvic pain, signs of severe infection (increased redness, swelling of an area), feeling faint or passing out, dizziness, etc. You should especially go to the ED for sudden acute worsening of condition if you do not elect to go at this time.      ED Prescriptions   None    PDMP not reviewed this encounter.   Lyndee Hensen, DO 02/24/22 1642

## 2022-02-24 NOTE — ED Triage Notes (Signed)
Pt with painful, swollen area to left lower leg inner aspect x Thursday. States she is concerned for blood clot since she has had one before

## 2022-02-26 ENCOUNTER — Ambulatory Visit
Admission: RE | Admit: 2022-02-26 | Discharge: 2022-02-26 | Disposition: A | Discharge status: Home / Self Care | Payer: Medicare Other | Source: Ambulatory Visit | Attending: Urology | Admitting: Urology

## 2022-02-26 DIAGNOSIS — N2889 Other specified disorders of kidney and ureter: Secondary | ICD-10-CM | POA: Insufficient documentation

## 2022-03-20 ENCOUNTER — Ambulatory Visit: Admitting: Urology

## 2022-03-26 ENCOUNTER — Other Ambulatory Visit: Payer: Self-pay | Admitting: Family Medicine

## 2022-03-26 ENCOUNTER — Inpatient Hospital Stay: Payer: Medicare Other | Attending: Oncology | Admitting: Oncology

## 2022-03-26 ENCOUNTER — Encounter: Payer: Self-pay | Admitting: Oncology

## 2022-03-26 VITALS — BP 125/59 | HR 105 | Temp 97.8°F | Resp 18 | Wt 197.4 lb

## 2022-03-26 DIAGNOSIS — Z1231 Encounter for screening mammogram for malignant neoplasm of breast: Secondary | ICD-10-CM

## 2022-03-26 DIAGNOSIS — N2889 Other specified disorders of kidney and ureter: Secondary | ICD-10-CM

## 2022-03-26 NOTE — Assessment & Plan Note (Addendum)
Left renal mass, s/p ablation on 12/20/2021  Continue CT surveillance.

## 2022-03-26 NOTE — Progress Notes (Signed)
Hematology/Oncology Consult note [phone] [phone]     CHIEF COMPLAINTS/REASON FOR VISIT:  Left Renal mass   ASSESSMENT & PLAN:   Renal mass, left Left renal mass, s/p ablation on 12/20/2021  Continue CT surveillance.   Orders Placed This Encounter  Procedures   CT Abdomen Pelvis Wo Contrast    Standing Status:   Future    Standing Expiration Date:   03/26/2023    Order Specific Question:   Preferred imaging location?    Answer:    [facility]    Order Specific Question:   Is Oral Contrast requested for this exam?    Answer:   Yes, Per Radiology protocol    Order Specific Question:   Does the patient have a contrast media/X-ray dye allergy?    Answer:   No   CBC with Differential (Cancer Center Only)    Standing Status:   Future    Standing Expiration Date:   03/26/2023   CMP ([place] only)    Standing Status:   Future    Standing Expiration Date:   03/26/2023   Follow up in 6 months.  All questions were answered. The patient knows to call the clinic with any problems, questions or concerns.  Earlie Server, MD, PhD [facility] Hematology Oncology 03/26/2022   HISTORY OF PRESENTING ILLNESS:   Janet [name] is a  72 y.o.  female with PMH listed below was seen in consultation at the request of  Marguerita Merles, MD  for evaluation of renal mass  02/28/2021, patient had ultrasound done which showed increased size of left kidney mass, up to 2.9 cm, concerning for renal neoplasm.  Right renal cyst, unchanged.  03/23/2021, CT abdomen pelvis with contrast showed 1. Exophytic enhancing mass at the lateral left kidney measuring up to 3 cm, should be considered renal cell carcinoma until proven otherwise. No lymphadenopathy or venous involvement visualized. 2. Right renal cyst. 3. Colonic diverticulosis. 4. Cholelithiasis.  03/31/2021: CT chest with contrast showed no definite findings of metastatic disease to the thorax.  Aortic  atherosclerosis. Patient was referred to establish care with urology and was seen by Dr. Diamantina Providence on 04/18/2021.  Options of active surveillance versus IR ablation versus partial nephrectomy were reviewed by neurology with patient.  Patient is not interested in surgery.  She elected to have ablation.    Patient reports that she was not contacted by radiology for ablation.  Today she has no new complaints. Denies any urinary difficulty, hematuria   INTERVAL HISTORY Janet [name] is a 72 y.o. 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 No new complaints.  02/26/2022 US renal showed  Hypoechoic central portion of the previously ablated mass in the LEFT kidney is 2.5 cm  Review of Systems  Constitutional:  Negative for appetite change, chills and fever.  HENT:   Negative for hearing loss and voice change.   Eyes:  Negative for eye problems.  Respiratory:  Negative for chest tightness and cough.   Cardiovascular:  Negative for chest pain.  Gastrointestinal:  Negative for abdominal distention, abdominal pain and blood in stool.  Endocrine: Negative for hot flashes.  Genitourinary:  Negative for difficulty urinating and frequency.   Musculoskeletal:  Negative for arthralgias.  Skin:  Negative for itching and rash.  Neurological:  Negative for extremity weakness.  Hematological:  Negative for adenopathy.  Psychiatric/Behavioral:  Negative for confusion.     MEDICAL HISTORY:  Past Medical History:  Diagnosis Date   Arthritis    Asthma    Cancer ([place])    kidney   Clotting disorder ([place])    Hypertension    Hypertension    Kidney mass    PONV (postoperative nausea and vomiting)    Shortness of breath    Thyroid disease    Varicose vein of leg    with leg ulcer    SURGICAL HISTORY: Past Surgical History:  Procedure Laterality Date   ABDOMINAL HYSTERECTOMY      COLONOSCOPY WITH PROPOFOL N/A 05/25/2019   Procedure: COLONOSCOPY WITH PROPOFOL;  Surgeon: Lin Landsman, MD;  Location: [facility] ENDOSCOPY;  Service: Gastroenterology;  Laterality: N/A;   IR RADIOLOGIST EVAL & MGMT  11/02/2021   RADIOLOGY WITH ANESTHESIA Left 12/20/2021   Procedure: Cryoablation of left renal mass;  Surgeon: Mir, Paula Libra, MD;  Location: [facility] ORS;  Service: Radiology;  Laterality: Left;   VARICOSE VEIN SURGERY      SOCIAL HISTORY: Social History   Socioeconomic History   Marital status: Married    Spouse name: Not on file   Number of children: Not on file   Years of education: Not on file   Highest education level: Not on file  Occupational History   Not on file  Tobacco Use   Smoking status: Former    Years: 5.00    Types: Cigarettes    Quit date: 47    Years since quitting: 29.2    Passive exposure: Past (Long time ago i or 2 cigarettes)   Smokeless tobacco: Never   Tobacco comments:    1 cig daily--quit around [place] Use   Vaping Use: Never used  Substance and Sexual Activity   Alcohol use: No   Drug use: No   Sexual activity: Not on file  Other Topics Concern   Not on file  Social History Narrative   Not on file   Social Determinants of Health   Financial Resource Strain: Low Risk  (04/10/2021)   Overall Financial Resource Strain (CARDIA)    Difficulty of Paying Living Expenses: Not hard at all  Food Insecurity: No Food Insecurity (04/10/2021)   Hunger Vital Sign    Worried About Running Out of Food in the Last Year: Never true    [place] in the Last Year: Never true  Transportation Needs: No Transportation Needs (04/10/2021)   PRAPARE - Hydrologist (Medical): No    Lack of Transportation (Non-Medical): No  Physical Activity: Inactive (04/10/2021)   Exercise Vital Sign    Days of Exercise per Week: 0 days    Minutes of Exercise per Session: 0 min  Stress: No Stress Concern Present (04/10/2021)    [place]    Feeling of Stress : Only a little  Social Connections: Socially Integrated (04/10/2021)   Social Connection and Isolation Panel [NHANES]    Frequency of Communication with Friends and Family: Three times a week    Frequency of Social Gatherings with Friends and Family: Three times a week    Attends Religious Services: 1 to 4 times per year    Active Member of Clubs or Organizations: No    Attends Archivist Meetings: 1 to 4 times per year    Marital Status: Married  Human resources officer Violence: Not At Risk (04/10/2021)  Humiliation, Afraid, Rape, and Kick questionnaire    Fear of Current or Ex-Partner: No    Emotionally Abused: No    Physically Abused: No    Sexually Abused: No    FAMILY HISTORY: Family History  Problem Relation Age of Onset   Colon cancer Mother 24   Hypertension Mother    Heart attack Father 32   Hypertension Father    Hyperlipidemia Father    Diabetes Father    Heart attack Brother    Heart attack Brother    Heart attack Daughter    Heart Problems Son    Stroke Neg Hx    Breast cancer Neg Hx     ALLERGIES:  is allergic to amlodipine, shellfish allergy, thiazide-type diuretics, and penicillins.  MEDICATIONS:  Current Outpatient Medications  Medication Sig Dispense Refill   albuterol (PROVENTIL HFA;VENTOLIN HFA) 108 (90 BASE) MCG/ACT inhaler Inhale 2 puffs into the lungs every 6 (six) hours as needed for wheezing or shortness of breath. 1 Inhaler 0   ascorbic acid (VITAMIN C) 500 MG tablet Take 500 mg by mouth daily.     aspirin EC 81 MG tablet Take 81 mg by mouth daily.     Butalbital-APAP-Caffeine 50-300-40 MG CAPS take 1 to 2 capsules by mouth every 6 hours if needed for headache     Elastic Bandages & Supports (EMS ANTI-EMBOLISM STOCKINGS) MISC Apply in the morning and remove at night.     famotidine (PEPCID) 20 MG tablet Take 1 tablet (20 mg total) by mouth 2  (two) times daily. (Patient taking differently: Take 20 mg by mouth 2 (two) times daily as needed for heartburn or indigestion.) 30 tablet 0   lidocaine (XYLOCAINE) 2 % solution Use as directed 15 mLs in the mouth or throat every 3 (three) hours as needed for mouth pain. 100 mL 0   olmesartan (BENICAR) 40 MG tablet Take 40 mg by mouth daily.     potassium chloride SA (K-DUR,KLOR-CON) 20 MEQ tablet Take 20 mEq by mouth daily.      predniSONE (STERAPRED UNI-PAK 21 TAB) 10 MG (21) TBPK tablet Take by mouth daily. Take 6 tabs by mouth daily  for 1 days, then 5 tabs for  days, then 4 tabs for 1 days, then 3 tabs for 1 days, 2 tabs for 1 days, then 1 tab by mouth daily for 1 days (Patient not taking: Reported on 03/26/2022) 21 tablet 0   No current facility-administered medications for this visit.     PHYSICAL EXAMINATION: ECOG PERFORMANCE STATUS: 0 - Asymptomatic Vitals:   03/26/22 1418  BP: (!) 125/59  Pulse: (!) 105  Resp: 18  Temp: 97.8 F (36.6 C)  SpO2: 97%   Filed Weights   03/26/22 1418  Weight: 197 lb 6.4 oz (89.5 kg)    Physical Exam Constitutional:      General: She is not in acute distress. HENT:     Head: Normocephalic and atraumatic.  Eyes:     General: No scleral icterus. Cardiovascular:     Rate and Rhythm: Normal rate.  Pulmonary:     Effort: Pulmonary effort is normal. No respiratory distress.     Breath sounds: No wheezing.  Abdominal:     General: Bowel sounds are normal. There is no distension.     Palpations: Abdomen is soft.  Musculoskeletal:        General: No deformity. Normal range of motion.     Cervical back: Normal range of motion and neck supple.  Skin:    General: Skin is warm and dry.     Findings: No erythema or rash.  Neurological:     Mental Status: She is alert and oriented to person, place, and time. Mental status is at baseline.     Cranial Nerves: No cranial nerve deficit.     Coordination: Coordination normal.  Psychiatric:         Mood and Affect: Mood normal.     LABORATORY DATA:  I have reviewed the data as listed    Latest Ref Rng & Units 01/02/2022    7:04 PM 12/20/2021    7:35 AM 11/28/2021    9:14 AM  CBC  WBC 4.0 - 10.5 K/uL 9.3  7.3  7.3   Hemoglobin 12.0 - 15.0 g/dL 12.9  12.4  12.3   Hematocrit 36.0 - 46.0 % 40.4  40.0  39.6   Platelets 150 - 400 K/uL 277  322  326       Latest Ref Rng & Units 01/02/2022    7:04 PM 12/20/2021    7:35 AM 11/28/2021    9:14 AM  CMP  Glucose 70 - 99 mg/dL 98  113  97   BUN 8 - 23 mg/dL '15  15  18   '$ Creatinine 0.44 - 1.00 mg/dL 0.83  0.83  0.84   Sodium 135 - 145 mmol/L 136  138  140   Potassium 3.5 - 5.1 mmol/L 3.6  4.9  3.9   Chloride 98 - 111 mmol/L 103  109  109   CO2 22 - 32 mmol/L '24  21  24   '$ Calcium 8.9 - 10.3 mg/dL 9.2  9.5  9.7      Iron/TIBC/Ferritin/ %Sat No results found for: "IRON", "TIBC", "FERRITIN", "IRONPCTSAT"    RADIOGRAPHIC STUDIES: I have personally reviewed the radiological images as listed and agreed with the findings in the report. Ultrasound renal complete  Result Date: 02/26/2022 CLINICAL DATA:  Renal mass. Patient had cryoablation of this mass performed 12/20/2021 the EXAM: RENAL / URINARY TRACT ULTRASOUND COMPLETE COMPARISON:  CT of the abdomen and pelvis on 10/13/2021 and 03/23/2021 FINDINGS: Right Kidney: Renal measurements: 11.3 x 5.8 x 5.3 centimeters = volume: 180.7 mL. Normal renal echogenicity. No hydronephrosis. A simple cyst in the MEDIAL midpole region is 3.0 x 2.8 x 2.6 centimeters. Left Kidney: Renal measurements: 10.7 x 5.4 x 4.5 centimeters = volume: 135.5 mL. Normal renal echogenicity. No hydronephrosis. An exophytic solid mass in the UPPER pole region is again noted. Mass consists of a hypoechoic central portion and a peripheral hyperechoic portion which is indistinct, possibly related to prior cryoablation. The hypoechoic portion of the mass measures 2.5 x 2.3 x 2.3 centimeters. Internal blood flow is confirmed on  Doppler evaluation. Bladder: Appears normal for degree of bladder distention. / Other: None. IMPRESSION: 1. Benign RIGHT renal cyst. 2. Hypoechoic central portion of the previously ablated mass in the LEFT kidney is 2.5 centimeters. Electronically Signed   By: Nolon Nations M.D.   On: 02/26/2022 15:21

## 2022-04-04 ENCOUNTER — Encounter: Payer: Self-pay | Admitting: Urology

## 2022-04-04 ENCOUNTER — Ambulatory Visit (INDEPENDENT_AMBULATORY_CARE_PROVIDER_SITE_OTHER): Payer: Medicare Other | Admitting: Urology

## 2022-04-04 VITALS — BP 152/69 | HR 85 | Ht 66.0 in | Wt 196.0 lb

## 2022-04-04 DIAGNOSIS — M6289 Other specified disorders of muscle: Secondary | ICD-10-CM

## 2022-04-04 DIAGNOSIS — N2889 Other specified disorders of kidney and ureter: Secondary | ICD-10-CM

## 2022-04-04 NOTE — Progress Notes (Signed)
   04/04/2022 10:52 AM   [place] 1950-08-22 [id number]  Reason for visit: Follow up left renal mass, urinary symptoms/prolapse  HPI: 72 year old female who I previously saw in April 2023 for above issues.  She had previously been followed with active surveillance for a 2.5 cm relatively stable left renal mass at [facility].  From our conversation at that time, I did not get the sense she had a good understanding of her CT findings, likely diagnosis of RCC, and the concept of active surveillance.  She reported significant fatigue at that visit over the last 6 months that she related to her renal mass and requested to pursue definitive treatment, and opted for ablation.  She ultimately underwent percutaneous ablation of the left renal mass in December 2024 by Dr. Dwaine Gale, this was not biopsied simultaneously.  She denies any problems since her ablation.  I personally viewed and interpreted the follow-up renal ultrasound from February 2024 that shows hypoechoic central portion of the previously ablated mass measuring 2.5 cm.  We did ask that CT or MRI are much better imaging test moving forward, and she has a CT scheduled with oncology for follow-up this summer.  At our prior visit in April 2023 she also reported prolapse symptoms, and I referred her to urogynecology.  They recommended pelvic floor physical therapy and consideration of a pessary, but she never followed up with them.  She continues to report a bulge as well as a sensation of incomplete emptying.  I replaced a referral to pelvic floor physical therapy, and encouraged her to follow-up with her urogynecologist Dr Sherrian Divers at [facility].  Continue follow-up cross-sectional imaging for monitoring of small left renal mass after percutaneous ablation with oncology/interventional radiology Follow-up with her urogynecologist at [facility] for urinary symptoms/prolapse   [place], [place] [address], [place], [place] [address] [phone]

## 2022-04-11 ENCOUNTER — Ambulatory Visit
Admission: RE | Admit: 2022-04-11 | Discharge: 2022-04-11 | Disposition: A | Discharge status: Home / Self Care | Payer: Medicare Other | Source: Ambulatory Visit | Attending: Family Medicine | Admitting: Family Medicine

## 2022-04-11 DIAGNOSIS — Z1231 Encounter for screening mammogram for malignant neoplasm of breast: Secondary | ICD-10-CM | POA: Diagnosis present

## 2022-04-12 ENCOUNTER — Encounter: Payer: Self-pay | Admitting: Family Medicine

## 2022-04-17 ENCOUNTER — Other Ambulatory Visit: Payer: Self-pay | Admitting: Family Medicine

## 2022-04-17 DIAGNOSIS — R928 Other abnormal and inconclusive findings on diagnostic imaging of breast: Secondary | ICD-10-CM

## 2022-04-17 DIAGNOSIS — N63 Unspecified lump in unspecified breast: Secondary | ICD-10-CM

## 2022-04-19 ENCOUNTER — Ambulatory Visit
Admission: RE | Admit: 2022-04-19 | Discharge: 2022-04-19 | Disposition: A | Discharge status: Home / Self Care | Payer: Medicare Other | Source: Ambulatory Visit | Attending: Family Medicine | Admitting: Family Medicine

## 2022-04-19 DIAGNOSIS — R928 Other abnormal and inconclusive findings on diagnostic imaging of breast: Secondary | ICD-10-CM | POA: Insufficient documentation

## 2022-04-19 DIAGNOSIS — N63 Unspecified lump in unspecified breast: Secondary | ICD-10-CM | POA: Insufficient documentation

## 2022-04-27 ENCOUNTER — Other Ambulatory Visit: Payer: Self-pay | Admitting: Family Medicine

## 2022-04-27 DIAGNOSIS — R928 Other abnormal and inconclusive findings on diagnostic imaging of breast: Secondary | ICD-10-CM

## 2022-04-27 DIAGNOSIS — N63 Unspecified lump in unspecified breast: Secondary | ICD-10-CM

## 2022-05-02 ENCOUNTER — Other Ambulatory Visit: Payer: Self-pay | Admitting: Family Medicine

## 2022-05-02 ENCOUNTER — Ambulatory Visit
Admission: RE | Admit: 2022-05-02 | Discharge: 2022-05-02 | Disposition: A | Discharge status: Home / Self Care | Payer: Medicare Other | Source: Ambulatory Visit | Attending: Family Medicine | Admitting: Family Medicine

## 2022-05-02 DIAGNOSIS — N6320 Unspecified lump in the left breast, unspecified quadrant: Secondary | ICD-10-CM | POA: Diagnosis not present

## 2022-05-02 DIAGNOSIS — N6002 Solitary cyst of left breast: Secondary | ICD-10-CM

## 2022-05-02 DIAGNOSIS — R928 Other abnormal and inconclusive findings on diagnostic imaging of breast: Secondary | ICD-10-CM | POA: Insufficient documentation

## 2022-05-02 DIAGNOSIS — R042 Hemoptysis: Secondary | ICD-10-CM

## 2022-05-02 DIAGNOSIS — N63 Unspecified lump in unspecified breast: Secondary | ICD-10-CM | POA: Insufficient documentation

## 2022-05-02 HISTORY — PX: BREAST BIOPSY: SHX20

## 2022-05-02 MED ORDER — LIDOCAINE HCL (PF) 1 % IJ SOLN
1.0000 mL | Freq: Once | INTRAMUSCULAR | Status: AC
  Administered 2022-05-02: 1 mL via INTRADERMAL

## 2022-05-02 MED ORDER — LIDOCAINE-EPINEPHRINE 1 %-1:100000 IJ SOLN
2.0000 mL | Freq: Once | INTRAMUSCULAR | Status: AC
  Administered 2022-05-02: 2 mL via INTRADERMAL

## 2022-05-06 ENCOUNTER — Ambulatory Visit
Admission: EM | Admit: 2022-05-06 | Discharge: 2022-05-06 | Disposition: A | Discharge status: Home / Self Care | Payer: Medicare Other | Attending: Nurse Practitioner | Admitting: Nurse Practitioner

## 2022-05-06 ENCOUNTER — Ambulatory Visit (INDEPENDENT_AMBULATORY_CARE_PROVIDER_SITE_OTHER): Payer: Medicare Other

## 2022-05-06 DIAGNOSIS — R051 Acute cough: Secondary | ICD-10-CM

## 2022-05-06 DIAGNOSIS — J069 Acute upper respiratory infection, unspecified: Secondary | ICD-10-CM

## 2022-05-06 MED ORDER — BENZONATATE 100 MG PO CAPS: 100.0000 mg | ORAL_CAPSULE | Freq: Three times a day (TID) | ORAL | 0 refills | Status: DC

## 2022-05-06 NOTE — Discharge Instructions (Signed)
Your chest x-ray was negative for pneumonia.  Your symptoms are consistent with a viral upper respiratory infection.  You may take Tessalon as needed for cough.  Rest and fluids.  Please follow-up with your PCP if your symptoms or not improving.  Please go to the emergency room if you develop any worsening symptoms.  Hope you feel better soon!

## 2022-05-06 NOTE — ED Provider Notes (Signed)
[facility]    CSN: [id number] Arrival date & time: 05/06/22  1315      History   Chief Complaint Chief Complaint  Patient presents with   Cough    HPI Janet [name] is a 72 y.o. female  presents for evaluation of URI symptoms for 4 days. Patient reports associated symptoms of, congestion. Denies N/V/D, fevers, sore throat, ear pain, body aches, shortness of breath. Patient does have a hx of asthma, smoking.  States she has an inhaler and has been using.  Her grandson has pneumonia.  Pt has taken cough medicine and Tylenol OTC for symptoms. Pt has no other concerns at this time.    Cough   Past Medical History:  Diagnosis Date   Arthritis    Asthma    Cancer    kidney   Clotting disorder    Hypertension    Hypertension    Kidney mass    PONV (postoperative nausea and vomiting)    Shortness of breath    Thyroid disease    Varicose vein of leg    with leg ulcer    Patient Active Problem List   Diagnosis Date Noted   Renal mass 12/20/2021   Genetic testing 05/08/2021   Numbness and tingling of left lower extremity 06/23/2018   Essential hypertension 05/02/2018   Swelling of limb 05/02/2018   Varicose veins with ulcer, left 05/02/2018   Left hip pain 11/01/2017   Headache disorder 08/29/2017   Premature ventricular contractions 05/25/2016   Asthma in adult without complication 04/23/2016   Heart palpitations 04/23/2016   History of kidney cancer 04/23/2016   Bilateral cellulitis of lower leg 10/03/2015   Renal mass, left 02/24/2015   Thrombophlebitis 09/29/2014   Chest pressure 09/12/2014   Dyspnea on exertion 09/12/2014   Generalized weakness 09/09/2014   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    Past Surgical History:  Procedure Laterality Date   ABDOMINAL HYSTERECTOMY     BREAST BIOPSY Left 05/02/2022   [place] Core Bx, - path pending   COLONOSCOPY WITH PROPOFOL N/A 05/25/2019   Procedure: COLONOSCOPY WITH PROPOFOL;  Surgeon: Toney Reil, MD;   Location: [facility] ENDOSCOPY;  Service: Gastroenterology;  Laterality: N/A;   IR RADIOLOGIST EVAL & MGMT  11/02/2021   RADIOLOGY WITH ANESTHESIA Left 12/20/2021   Procedure: Cryoablation of left renal mass;  Surgeon: Mir, Al Corpus, MD;  Location: [facility] ORS;  Service: Radiology;  Laterality: Left;   VARICOSE VEIN SURGERY      OB History     Gravida  3   Para      Term      Preterm      AB      Living  2      SAB      IAB      Ectopic      Multiple      Live Births               Home Medications    Prior to Admission medications   Medication Sig Start Date End Date Taking? Authorizing Provider  benzonatate (TESSALON) 100 MG capsule Take 1 capsule (100 mg total) by mouth every 8 (eight) hours. 05/06/22  Yes Radford Pax, NP  albuterol (PROVENTIL HFA;VENTOLIN HFA) 108 (90 BASE) MCG/ACT inhaler Inhale 2 puffs into the lungs every 6 (six) hours as needed for wheezing or shortness of breath. 12/24/14   Phineas Semen, MD  ascorbic acid (VITAMIN C) 500 MG tablet Take  500 mg by mouth daily.    [provider]  aspirin EC 81 MG tablet Take 81 mg by mouth daily.    [provider]  Butalbital-APAP-Caffeine 50-300-40 MG CAPS take 1 to 2 capsules by mouth every 6 hours if needed for headache 07/30/17   [provider]  Elastic Bandages & Supports (EMS ANTI-EMBOLISM STOCKINGS) MISC Apply in the morning and remove at night. 06/23/14   [provider]  famotidine (PEPCID) 20 MG tablet Take 1 tablet (20 mg total) by mouth 2 (two) times daily. 08/23/21   Brimage, Seward Meth, DO  lidocaine (XYLOCAINE) 2 % solution Use as directed 15 mLs in the mouth or throat every 3 (three) hours as needed for mouth pain. 08/23/21   Brimage, Seward Meth, DO  olmesartan (BENICAR) 40 MG tablet Take 40 mg by mouth daily. 11/25/21   [provider]  potassium chloride SA (K-DUR,KLOR-CON) 20 MEQ tablet Take 20 mEq by mouth daily.     [provider]  linaclotide  Karlene Einstein) 145 MCG CAPS capsule Take 1 capsule (145 mcg total) by mouth daily before breakfast. 09/30/19 05/30/20  Toney Reil, MD    Family History Family History  Problem Relation Age of Onset   Colon cancer Mother 66   Hypertension Mother    Heart attack Father 26   Hypertension Father    Hyperlipidemia Father    Diabetes Father    Heart attack Brother    Heart attack Brother    Heart attack Daughter    Heart Problems Son    Stroke Neg Hx    Breast cancer Neg Hx     Social History Social History   Tobacco Use   Smoking status: Former    Years: 5    Types: Cigarettes    Quit date: 1995    Years since quitting: 29.3    Passive exposure: Past (Long time ago i or 2 cigarettes)   Smokeless tobacco: Never   Tobacco comments:    1 cig daily--quit around 1995  Vaping Use   Vaping Use: Never used  Substance Use Topics   Alcohol use: No   Drug use: No     Allergies   Amlodipine, Shellfish allergy, Thiazide-type diuretics, and Penicillins   Review of Systems Review of Systems  HENT:  Positive for congestion.   Respiratory:  Positive for cough.      Physical Exam Triage Vital Signs ED Triage Vitals  Enc Vitals Group     BP 05/06/22 1356 125/73     Pulse Rate 05/06/22 1356 94     Resp 05/06/22 1356 18     Temp 05/06/22 1356 98.8 F (37.1 C)     Temp Source 05/06/22 1356 Oral     SpO2 05/06/22 1356 99 %     Weight 05/06/22 1358 197 lb (89.4 kg)     Height 05/06/22 1358  (1.676 m)     Head Circumference --      Peak Flow --      Pain Score 05/06/22 1358 7     Pain Loc --      Pain Edu? --      Excl. in GC? --    No data found.  Updated Vital Signs BP 125/73 (BP Location: Left Arm)   Pulse 94   Temp 98.8 F (37.1 C) (Oral)   Resp 18   Ht  (1.676 m)   Wt 197 lb (89.4 kg)   SpO2 99%  BMI 31.80 kg/m   Visual Acuity Right Eye Distance:   Left Eye Distance:   Bilateral Distance:    Right Eye Near:   Left Eye Near:    Bilateral  Near:     Physical Exam Vitals and nursing note reviewed.  Constitutional:      General: She is not in acute distress.    Appearance: She is well-developed. She is not ill-appearing.  HENT:     Head: Normocephalic and atraumatic.     Right Ear: Tympanic membrane and ear canal normal.     Left Ear: Tympanic membrane and ear canal normal.     Nose: Congestion present.     Mouth/Throat:     Mouth: Mucous membranes are moist.     Pharynx: Oropharynx is clear. Uvula midline. No oropharyngeal exudate or posterior oropharyngeal erythema.     Tonsils: No tonsillar exudate or tonsillar abscesses.  Eyes:     Conjunctiva/sclera: Conjunctivae normal.     Pupils: Pupils are equal, round, and reactive to light.  Cardiovascular:     Rate and Rhythm: Normal rate and regular rhythm.     Heart sounds: Normal heart sounds.  Pulmonary:     Effort: Pulmonary effort is normal.     Breath sounds: Normal breath sounds.  Musculoskeletal:     Cervical back: Normal range of motion and neck supple.  Lymphadenopathy:     Cervical: No cervical adenopathy.  Skin:    General: Skin is warm and dry.  Neurological:     General: No focal deficit present.     Mental Status: She is alert and oriented to person, place, and time.  Psychiatric:        Mood and Affect: Mood normal.        Behavior: Behavior normal.      UC Treatments / Results  Labs (all labs ordered are listed, but only abnormal results are displayed) Labs Reviewed - No data to display  EKG   Radiology DG Chest 2 View  Result Date: 05/06/2022 CLINICAL DATA:  Four-day history of cough. EXAM: CHEST - 2 VIEW COMPARISON:  05/08/2019 FINDINGS: The cardiac silhouette, mediastinal and hilar contours are within normal limits. The lungs are clear. No pleural effusions or pulmonary lesions. The bony thorax is intact. IMPRESSION: No acute cardiopulmonary findings. Electronically Signed   By: Rudie Meyer M.D.   On: 05/06/2022 14:29     Procedures Procedures (including critical care time)  Medications Ordered in UC Medications - No data to display  Initial Impression / Assessment and Plan / UC Course  I have reviewed the triage vital signs and the nursing notes.  Pertinent labs & imaging results that were available during my care of the patient were reviewed by me and considered in my medical decision making (see chart for details).     Reviewed exam and symptoms with patient.  No red flags.  Chest x-ray negative for pneumonia.  Discussed viral illness and symptomatic treatment. Tessalon as needed for cough PCP follow-up if symptoms do not improve ER precautions reviewed and patient verbalized understanding Final Clinical Impressions(s) / UC Diagnoses   Final diagnoses:  Acute cough  Viral URI with cough     Discharge Instructions      Your chest x-ray was negative for pneumonia.  Your symptoms are consistent with a viral upper respiratory infection.  You may take Tessalon as needed for cough.  Rest and fluids.  Please follow-up with your PCP if your symptoms or not  improving.  Please go to the emergency room if you develop any worsening symptoms.  Hope you feel better soon!     ED Prescriptions     Medication Sig Dispense Auth. Provider   benzonatate (TESSALON) 100 MG capsule Take 1 capsule (100 mg total) by mouth every 8 (eight) hours. 21 capsule Radford Pax, NP      PDMP not reviewed this encounter.   Radford Pax, NP 05/06/22 1440

## 2022-05-06 NOTE — ED Triage Notes (Signed)
Patient presents with cough x day 4 and chest soreness from coughing. Pt have been treating with OTC Tylenol and cough medicine.

## 2022-05-10 ENCOUNTER — Ambulatory Visit
Admission: RE | Admit: 2022-05-10 | Discharge: 2022-05-10 | Disposition: A | Discharge status: Home / Self Care | Payer: Medicare Other | Source: Ambulatory Visit | Attending: Family Medicine | Admitting: Family Medicine

## 2022-05-10 DIAGNOSIS — R042 Hemoptysis: Secondary | ICD-10-CM | POA: Diagnosis not present

## 2022-05-24 ENCOUNTER — Other Ambulatory Visit: Payer: Self-pay | Admitting: Family Medicine

## 2022-05-24 DIAGNOSIS — E049 Nontoxic goiter, unspecified: Secondary | ICD-10-CM

## 2022-05-28 ENCOUNTER — Ambulatory Visit
Admission: RE | Admit: 2022-05-28 | Discharge: 2022-05-28 | Disposition: A | Discharge status: Home / Self Care | Payer: Medicare Other | Source: Ambulatory Visit | Attending: Family Medicine | Admitting: Family Medicine

## 2022-05-28 DIAGNOSIS — E049 Nontoxic goiter, unspecified: Secondary | ICD-10-CM | POA: Diagnosis present

## 2022-06-29 ENCOUNTER — Other Ambulatory Visit: Payer: Self-pay | Admitting: Family Medicine

## 2022-06-29 ENCOUNTER — Ambulatory Visit
Admission: RE | Admit: 2022-06-29 | Discharge: 2022-06-29 | Disposition: A | Discharge status: Home / Self Care | Payer: Medicare Other | Source: Ambulatory Visit | Attending: Family Medicine | Admitting: Family Medicine

## 2022-06-29 DIAGNOSIS — R634 Abnormal weight loss: Secondary | ICD-10-CM | POA: Diagnosis present

## 2022-07-26 ENCOUNTER — Ambulatory Visit: Admission: RE | Admit: 2022-07-26 | Payer: Medicare Other | Source: Ambulatory Visit

## 2022-07-27 ENCOUNTER — Ambulatory Visit: Admission: RE | Admit: 2022-07-27 | Payer: Medicare Other | Source: Ambulatory Visit

## 2022-09-13 IMAGING — CR DG LUMBAR SPINE 2-3V
3 series · 3 of 3 positions shown · non-contrast
Comparison: None.

CLINICAL DATA: Pain status post fall

EXAM:
LUMBAR SPINE - 2-3 VIEW

[l-spine ap]
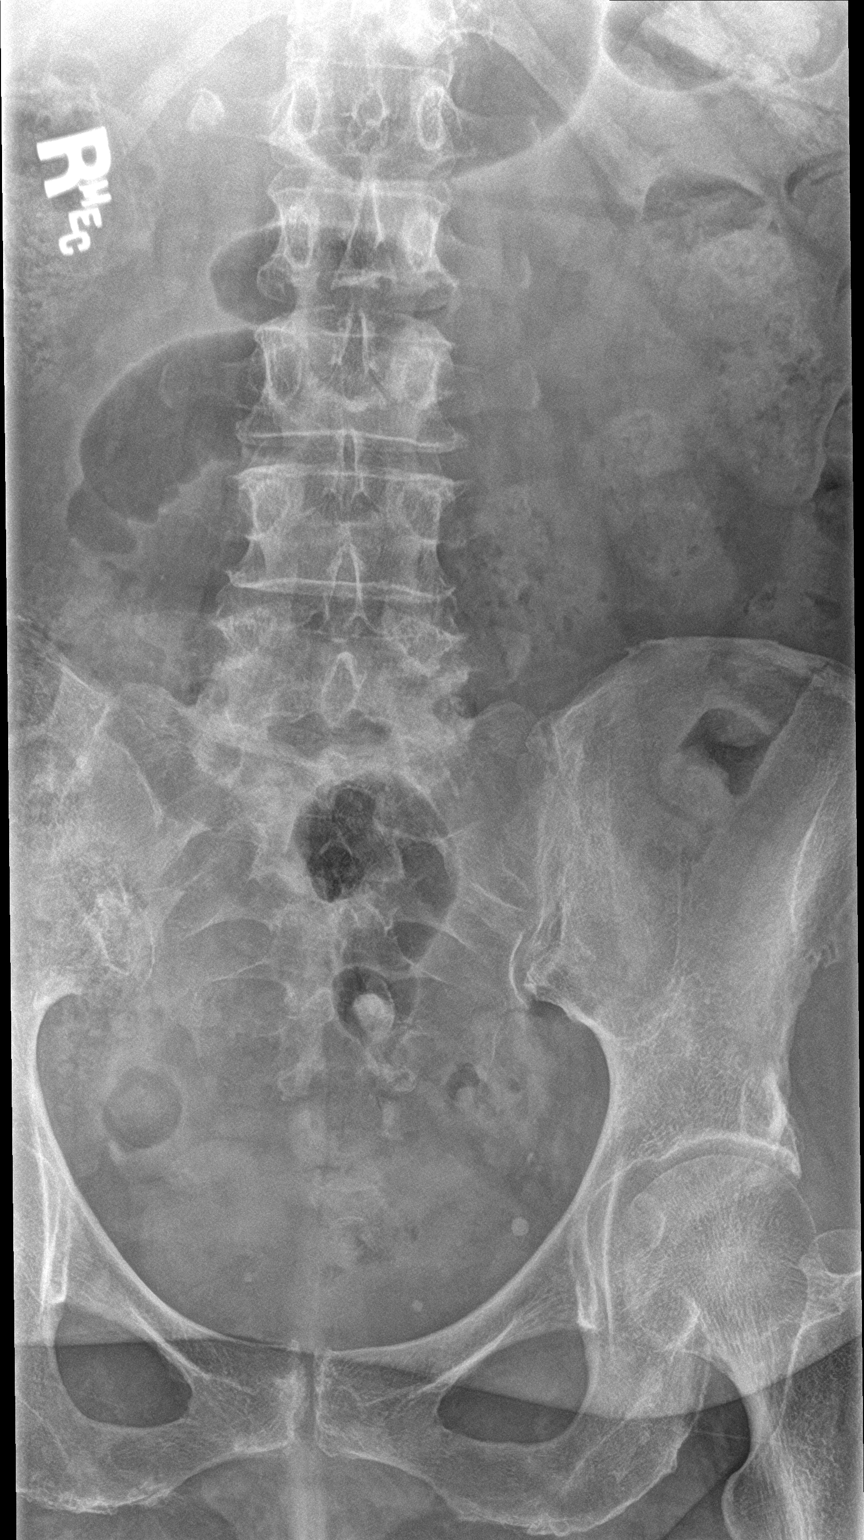

[l-spine lat]
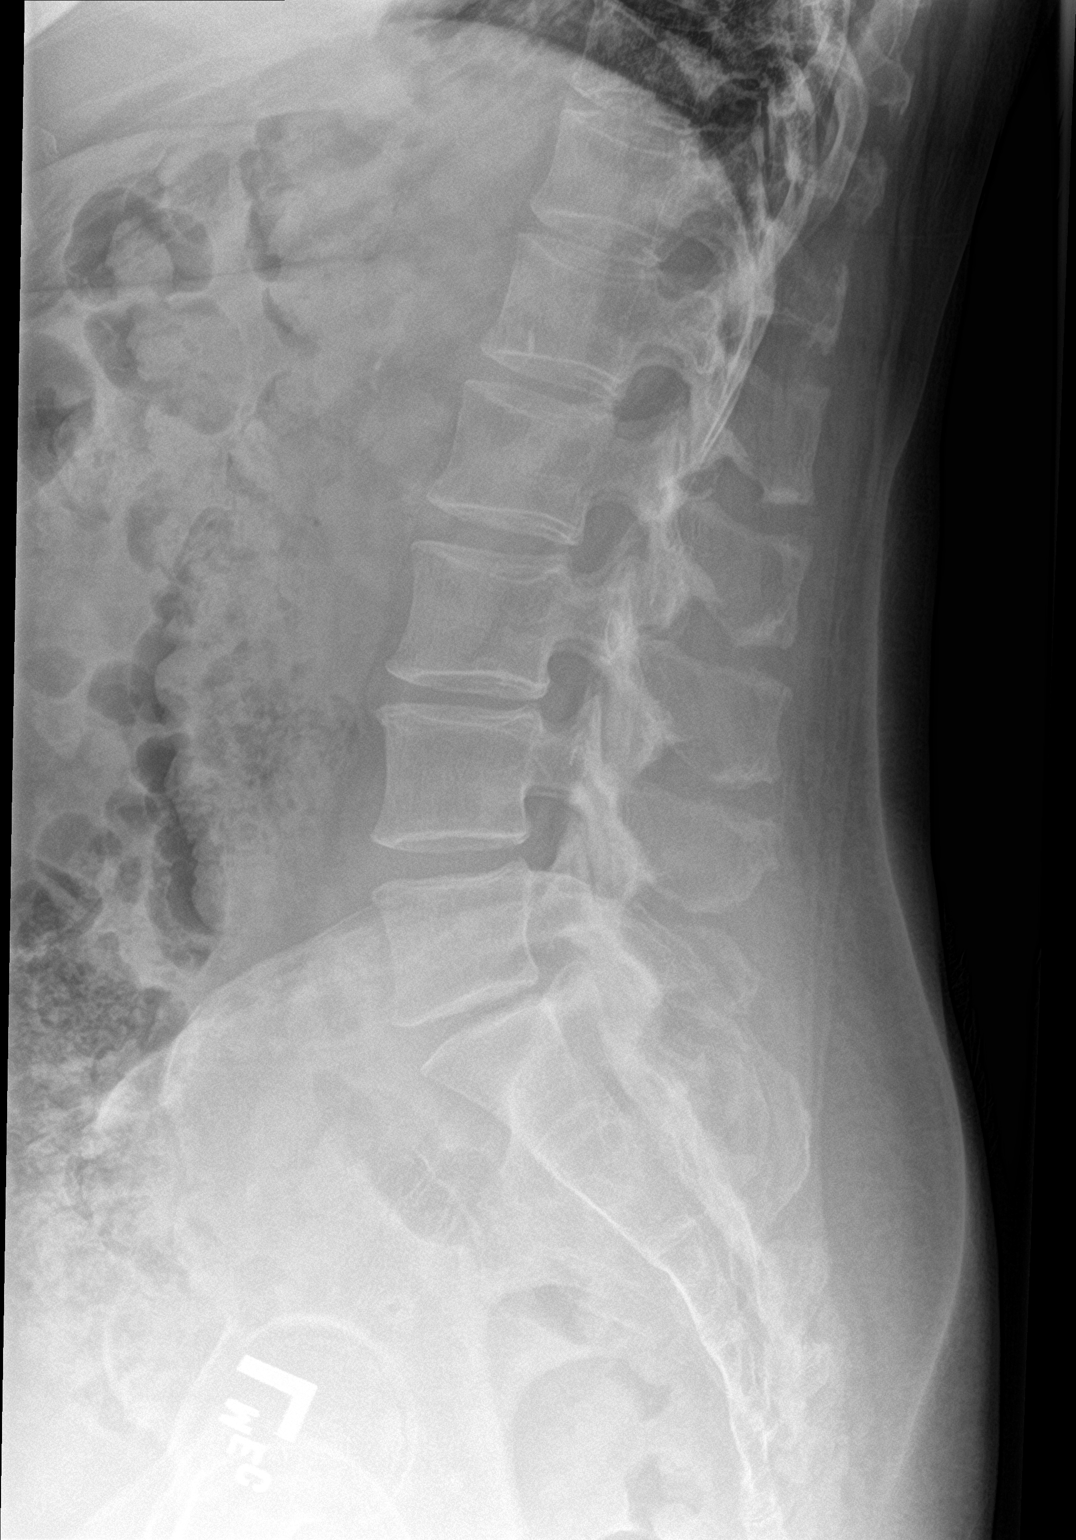

[l-spine spot]
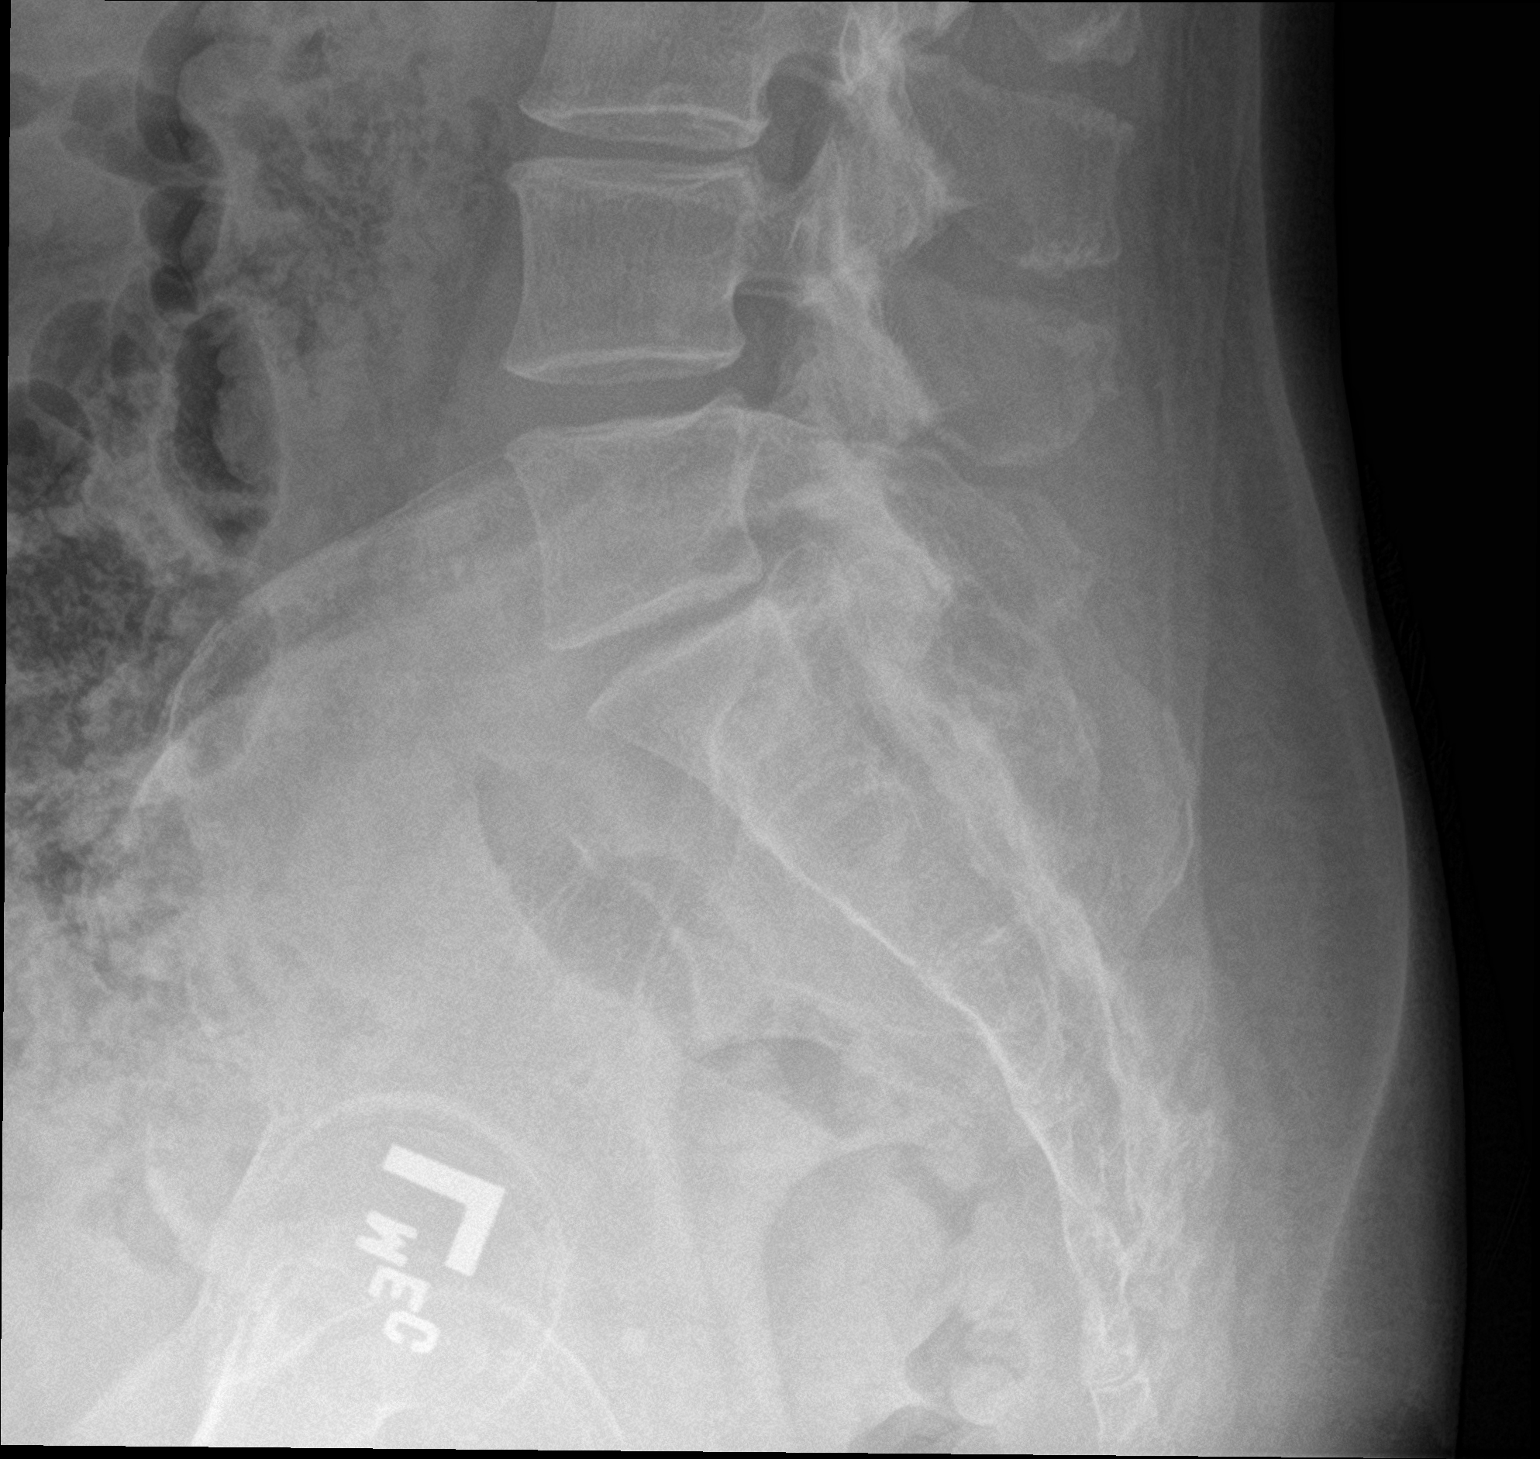

[3 of 3 positions shown; findings below may reference images not displayed]

FINDINGS: There is no acute compression fracture. Mild-to-moderate multilevel
degenerative changes are noted throughout the lumbar spine, greatest
at the L5-S1 level. There is no significant malalignment. There is
facet arthrosis in the lower lumbar segments.
IMPRESSION: No acute compression fracture or significant malalignment.
Mild-to-moderate multilevel degenerative changes, greatest at L5-S1.

## 2022-09-13 IMAGING — CR DG HUMERUS 2V *R*
2 series · 2 of 2 positions shown · non-contrast
Comparison: None.

CLINICAL DATA: Pain status post fall

EXAM:
RIGHT HUMERUS - 2+ VIEW

[humerus ap]
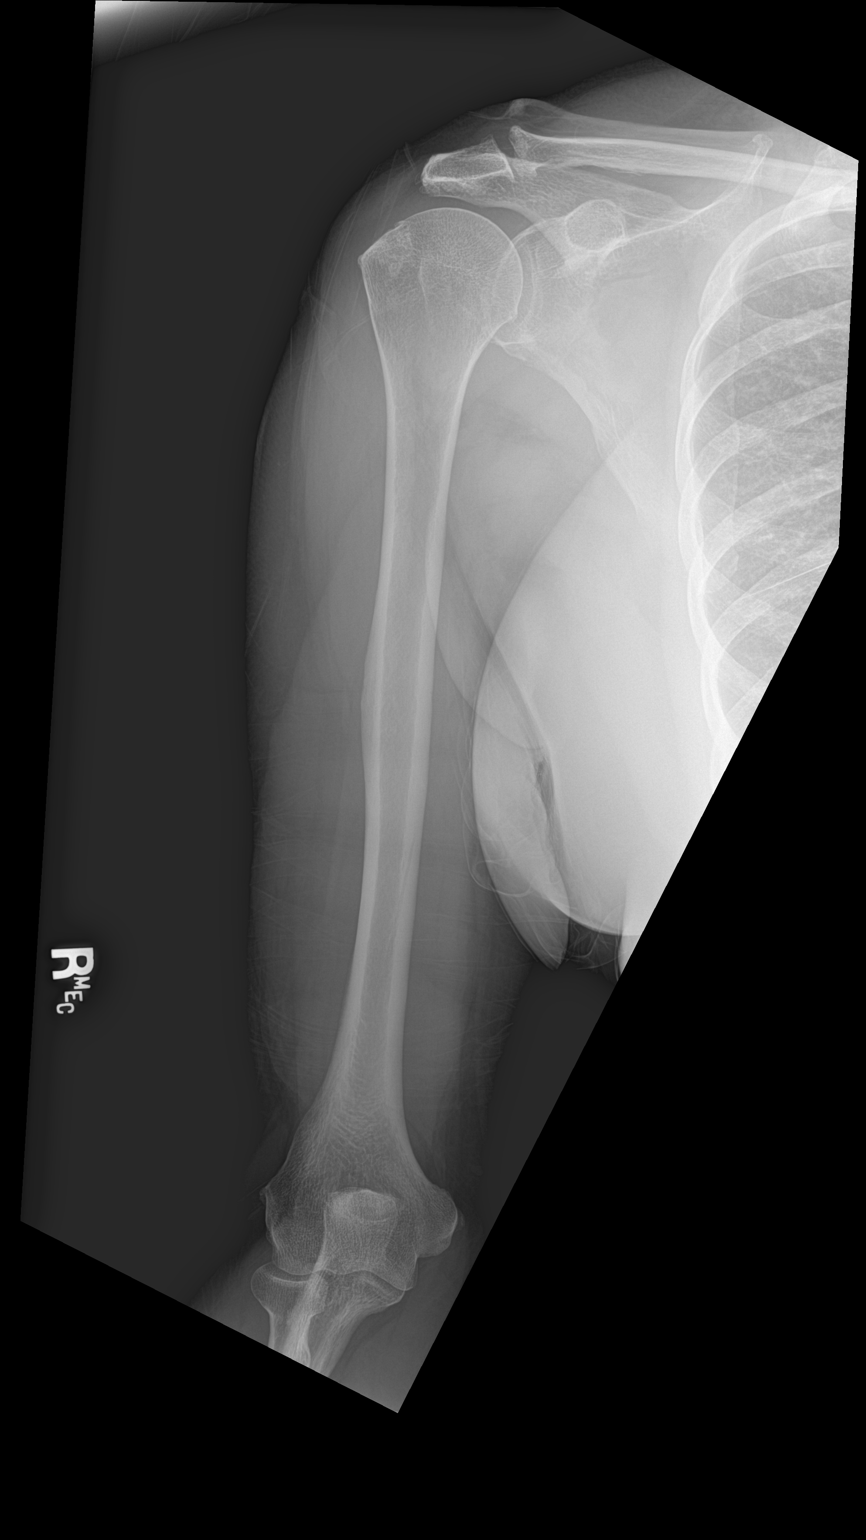

[humerus lat]
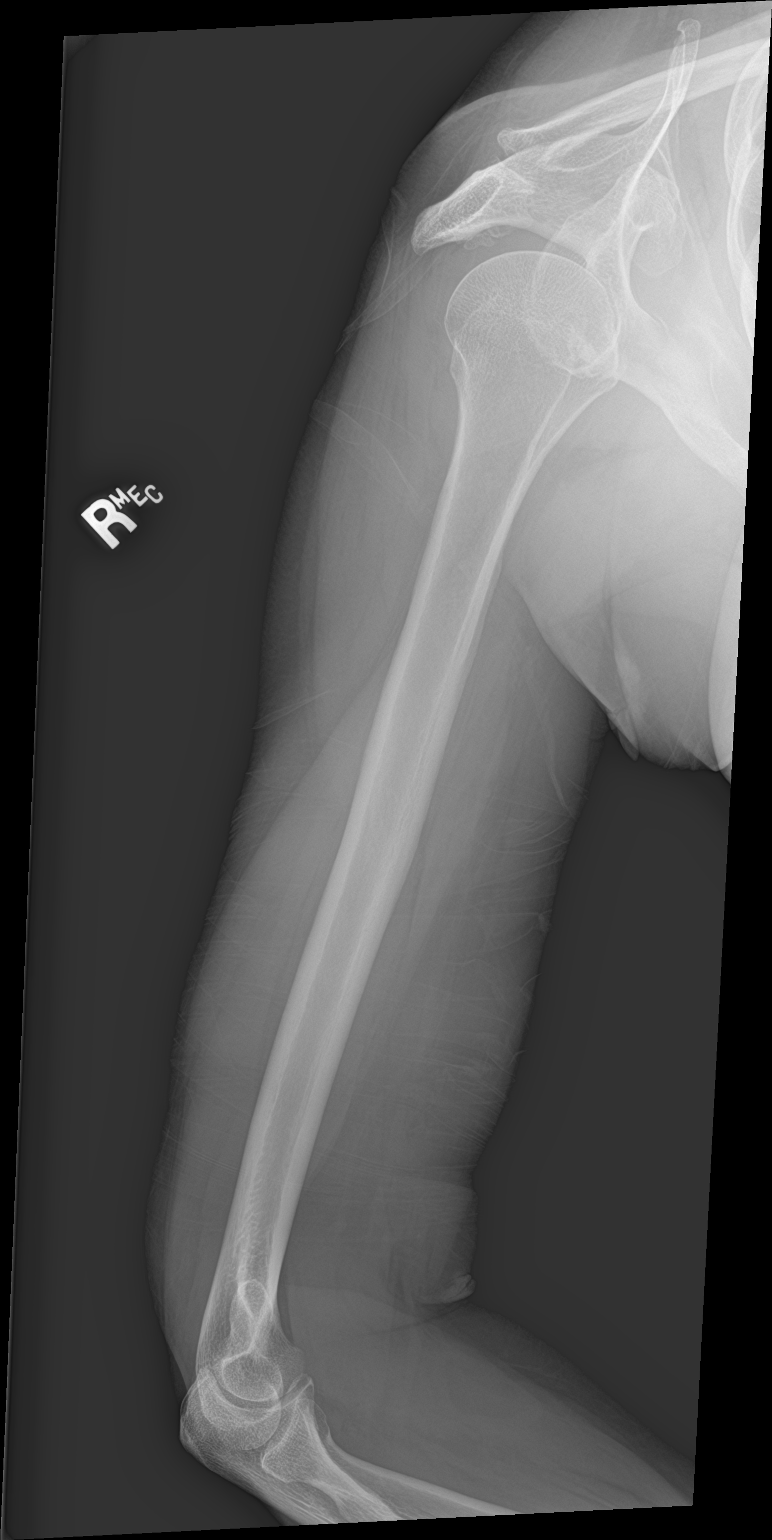

[2 of 2 positions shown; findings below may reference images not displayed]

FINDINGS: There are mild degenerative changes of the glenohumeral joint. There
are multiple intra-articular loose bodies. There are moderate
degenerative changes of the AC joint with inferiorly oriented
osteophytes. There is no acute displaced fracture. No dislocation.
IMPRESSION: 1. No acute displaced fracture or dislocation.
2. Mild degenerative changes of the glenohumeral joint with multiple
intra-articular loose bodies.
3. Moderate acromioclavicular joint osteoarthritis.

## 2022-10-10 ENCOUNTER — Encounter: Payer: Self-pay | Admitting: Oncology

## 2022-10-10 ENCOUNTER — Inpatient Hospital Stay: Payer: Medicare Other | Admitting: Oncology

## 2022-10-10 ENCOUNTER — Telehealth: Payer: Self-pay | Admitting: Oncology

## 2022-10-10 ENCOUNTER — Inpatient Hospital Stay: Payer: Medicare Other | Attending: Oncology

## 2022-10-10 NOTE — Telephone Encounter (Signed)
Pt no showed appt today, tried to reach out but number just kept ringing. No show letter sent.   Per Dr.Yu, pt need to r/s CT appt before her lab/MD appt

## 2022-12-11 ENCOUNTER — Other Ambulatory Visit (HOSPITAL_COMMUNITY): Payer: Self-pay | Admitting: Family Medicine

## 2022-12-11 DIAGNOSIS — R229 Localized swelling, mass and lump, unspecified: Secondary | ICD-10-CM

## 2022-12-12 NOTE — Progress Notes (Signed)
Simonne Come, MD  Claudean Kinds No imaging.  Most recent relevant imaging was a thyroid US from 5/13 and this cheek nodule was not imaged on that exam.  Thx, Vonna Kotyk       Previous Messages    ----- Message ----- From: Claudean Kinds Sent: 12/11/2022   9:32 AM EST To: Claudean Kinds; Ir Procedure Requests Subject: [place] Core Biopsy (soft tissue)                  Procedure : [place] Core Biopsy (soft tissue)  Reason : per Radiology recommendation, percutaneous ultrasound guided tissue sampling of 1.3 cm hypocohoic lesion right cheek which is indeterminate with ultrasound and may represent a prominent lymph node Dx: Subcutaneous nodule [R22.9 (ICD-10-CM)]    History : Chest xray , Thyroid US , Ct chest w/o  Provider: Leanna Sato, MD  Provider contact : [phone]

## 2023-04-05 ENCOUNTER — Encounter: Payer: Self-pay | Admitting: Emergency Medicine

## 2023-04-05 ENCOUNTER — Ambulatory Visit
Admission: EM | Admit: 2023-04-05 | Discharge: 2023-04-05 | Disposition: A | Discharge status: Home / Self Care | Attending: Emergency Medicine | Admitting: Emergency Medicine

## 2023-04-05 DIAGNOSIS — R42 Dizziness and giddiness: Secondary | ICD-10-CM | POA: Insufficient documentation

## 2023-04-05 DIAGNOSIS — R079 Chest pain, unspecified: Secondary | ICD-10-CM | POA: Diagnosis present

## 2023-04-05 DIAGNOSIS — R0602 Shortness of breath: Secondary | ICD-10-CM | POA: Diagnosis present

## 2023-04-05 LAB — COMPREHENSIVE METABOLIC PANEL
ALT: 13 U/L (ref 0–44)
AST: 21 U/L (ref 15–41)
Albumin: 4.1 g/dL (ref 3.5–5.0)
Alkaline Phosphatase: 45 U/L (ref 38–126)
Anion gap: 9 (ref 5–15)
BUN: 19 mg/dL (ref 8–23)
CO2: 23 mmol/L (ref 22–32)
Calcium: 9.8 mg/dL (ref 8.9–10.3)
Chloride: 105 mmol/L (ref 98–111)
Creatinine, Ser: 1.02 mg/dL — ABNORMAL HIGH (ref 0.44–1.00)
GFR, Estimated: 58 mL/min — ABNORMAL LOW (ref >60)
Glucose, Bld: 93 mg/dL (ref 70–99)
Potassium: 3.6 mmol/L (ref 3.5–5.1)
Sodium: 137 mmol/L (ref 135–145)
Total Bilirubin: 1 mg/dL (ref 0.0–1.2)
Total Protein: 8.5 g/dL — ABNORMAL HIGH (ref 6.5–8.1)

## 2023-04-05 NOTE — ED Triage Notes (Addendum)
 Patient states that she has been having dizziness,sob,blurry vision, chest discomfort off and on going on a month.   Chest discomfort feels like sob when she's exerted. Patient has a hx of asthma .

## 2023-04-05 NOTE — Discharge Instructions (Addendum)
 Your blood work showed that you have a mild worsening of your renal function though your electrolytes are normal.  Your EKG also did not show any signs of any arrhythmia or acute cardiac event that could explain your symptoms.  Call and make a follow-up appointment with cardiology for next week to discuss your symptoms.  If you have a return of chest pain, blurry vision, dizziness, headache, or shortness of breath I recommend you call 911 and go to the emergency department.

## 2023-04-05 NOTE — ED Provider Notes (Signed)
 [facility]    CSN: [id number] Arrival date & time: 04/05/23  1352      History   Chief Complaint Chief Complaint  Patient presents with   Dizziness   Shortness of Breath   Blurred Vision   Chest Pain    HPI Janet [name] is a 73 y.o. female.   HPI  73 year old female with past medical history significant for hypertension, thyroid disease, arthritis, asthma, clotting disorder, shortness of breath, cancer, kidney mass, varicose vein with venous stasis ulcer presents for evaluation of intermittent dizziness, blurry vision, shortness breath, and chest pain.  She states she is not having any now but she did have the symptoms 1 hour prior to arrival.  She denies any swelling on her legs.  She states is not every day and she does not know any precipitating factors.  She has not talked to her cardiologist about her symptoms.  Past Medical History:  Diagnosis Date   Arthritis    Asthma    Cancer (HCC)    kidney   Clotting disorder (HCC)    Hypertension    Hypertension    Kidney mass    PONV (postoperative nausea and vomiting)    Shortness of breath    Thyroid disease    Varicose vein of leg    with leg ulcer    Patient Active Problem List   Diagnosis Date Noted   Renal mass 12/20/2021   Genetic testing 05/08/2021   Numbness and tingling of left lower extremity 06/23/2018   Essential hypertension 05/02/2018   Swelling of limb 05/02/2018   Varicose veins with ulcer, 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   Left hip pain 11/01/2017   Headache disorder 08/29/2017   Premature ventricular contractions 05/25/2016   Asthma in adult without complication 04/23/2016   Heart palpitations 04/23/2016   History of kidney cancer 04/23/2016   Bilateral cellulitis of lower leg 10/03/2015   Renal mass, left 02/24/2015   Thrombophlebitis 09/29/2014   Chest pressure 09/12/2014   Dyspnea on exertion 09/12/2014   Generalized weakness 09/09/2014   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    Past  Surgical History:  Procedure Laterality Date   ABDOMINAL HYSTERECTOMY     BREAST BIOPSY Left 05/02/2022   [place] Core Bx, - path pending   COLONOSCOPY WITH PROPOFOL N/A 05/25/2019   Procedure: COLONOSCOPY WITH PROPOFOL;  Surgeon: Toney Reil, MD;  Location: [facility] ENDOSCOPY;  Service: Gastroenterology;  Laterality: N/A;   IR RADIOLOGIST EVAL & MGMT  11/02/2021   RADIOLOGY WITH ANESTHESIA Left 12/20/2021   Procedure: Cryoablation of left renal mass;  Surgeon: Mir, Al Corpus, MD;  Location: [facility] ORS;  Service: Radiology;  Laterality: Left;   VARICOSE VEIN SURGERY      OB History     Gravida  3   Para      Term      Preterm      AB      Living  2      SAB      IAB      Ectopic      Multiple      Live Births               Home Medications    Prior to Admission medications   Medication Sig Start Date End Date Taking? Authorizing Provider  aspirin EC 81 MG tablet Take 81 mg by mouth daily.   Yes [provider]  lisinopril (ZESTRIL) 40 MG tablet LISINOPRIL 40 MG TABS 01/13/18  Yes [provider]  potassium chloride SA (K-DUR,KLOR-CON) 20 MEQ tablet Take 20 mEq by mouth daily.    Yes [provider]  albuterol (PROVENTIL HFA;VENTOLIN HFA) 108 (90 BASE) MCG/ACT inhaler Inhale 2 puffs into the lungs every 6 (six) hours as needed for wheezing or shortness of breath. 12/24/14   Phineas Semen, MD  ascorbic acid (VITAMIN C) 500 MG tablet Take 500 mg by mouth daily.    [provider]  benzonatate (TESSALON) 100 MG capsule Take 1 capsule (100 mg total) by mouth every 8 (eight) hours. 05/06/22   Radford Pax, NP  Butalbital-APAP-Caffeine 50-300-40 MG CAPS take 1 to 2 capsules by mouth every 6 hours if needed for headache 07/30/17   [provider]  Elastic Bandages & Supports (EMS ANTI-EMBOLISM STOCKINGS) MISC Apply in the morning and remove at night. 06/23/14   [provider]  famotidine (PEPCID) 20 MG tablet Take 1  tablet (20 mg total) by mouth 2 (two) times daily. 08/23/21   Brimage, Seward Meth, DO  lidocaine (XYLOCAINE) 2 % solution Use as directed 15 mLs in the mouth or throat every 3 (three) hours as needed for mouth pain. 08/23/21   Brimage, Seward Meth, DO  olmesartan (BENICAR) 40 MG tablet Take 40 mg by mouth daily. 11/25/21   [provider]  linaclotide Karlene Einstein) 145 MCG CAPS capsule Take 1 capsule (145 mcg total) by mouth daily before breakfast. 09/30/19 05/30/20  Toney Reil, MD    Family History Family History  Problem Relation Age of Onset   Colon cancer Mother 63   Hypertension Mother    Heart attack Father 82   Hypertension Father    Hyperlipidemia Father    Diabetes Father    Heart attack Brother    Heart attack Brother    Heart attack Daughter    Heart Problems Son    Stroke Neg Hx    Breast cancer Neg Hx     Social History Social History   Tobacco Use   Smoking status: Former    Current packs/day: 0.00    Types: Cigarettes    Start date: 1990    Quit date: 1995    Years since quitting: 30.2    Passive exposure: Past (Long time ago i or 2 cigarettes)   Smokeless tobacco: Never   Tobacco comments:    1 cig daily--quit around 1995  Vaping Use   Vaping status: Never Used  Substance Use Topics   Alcohol use: No   Drug use: No     Allergies   Amlodipine, Shellfish allergy, Thiazide-type diuretics, and Penicillins   Review of Systems Review of Systems  Eyes:  Positive for visual disturbance.  Respiratory:  Positive for shortness of breath.   Cardiovascular:  Positive for chest pain. Negative for leg swelling.  Neurological:  Positive for dizziness and headaches.     Physical Exam Triage Vital Signs ED Triage Vitals  Encounter Vitals Group     BP      Systolic BP Percentile      Diastolic BP Percentile      Pulse      Resp      Temp      Temp src      SpO2      Weight      Height      Head Circumference      Peak Flow      Pain Score       Pain Loc  Pain Education      Exclude from Growth Chart    No data found.  Updated Vital Signs BP (!) 148/81 (BP Location: Right Arm)   Pulse 64   Temp 98.3 F (36.8 C) (Oral)   Resp 16   SpO2 98%   Visual Acuity Right Eye Distance:   Left Eye Distance:   Bilateral Distance:    Right Eye Near:   Left Eye Near:    Bilateral Near:     Physical Exam Vitals and nursing note reviewed.  Constitutional:      Appearance: Normal appearance. She is not ill-appearing.  HENT:     Head: Normocephalic and atraumatic.  Cardiovascular:     Rate and Rhythm: Normal rate and regular rhythm.     Pulses: Normal pulses.     Heart sounds: Normal heart sounds. No murmur heard.    No friction rub. No gallop.  Pulmonary:     Effort: Pulmonary effort is normal.     Breath sounds: Normal breath sounds. No wheezing, rhonchi or rales.  Skin:    General: Skin is warm and dry.     Capillary Refill: Capillary refill takes less than 2 seconds.  Neurological:     General: No focal deficit present.     Mental Status: She is alert and oriented to person, place, and time.      UC Treatments / Results  Labs (all labs ordered are listed, but only abnormal results are displayed) Labs Reviewed  COMPREHENSIVE METABOLIC PANEL - Abnormal; Notable for the following components:      Result Value   Creatinine, Ser 1.02 (*)    Total Protein 8.5 (*)    GFR, Estimated 58 (*)    All other components within normal limits    EKG Normal sinus rhythm with a ventricular rate of 61 bpm PR interval 190 ms QRS duration 70 ms QT/QTc 362/364 ms No ST or T wave abnormalities noted.  Radiology No results found.  Procedures Procedures (including critical care time)  Medications Ordered in UC Medications - No data to display  Initial Impression / Assessment and Plan / UC Course  I have reviewed the triage vital signs and the nursing notes.  Pertinent labs & imaging results that were available during  my care of the patient were reviewed by me and considered in my medical decision making (see chart for details).   Patient is a pleasant, nontoxic-appearing 73 year old female presenting for evaluation of intermittent blurry vision, dizziness, shortness breath, and chest pain with occasional headache as outlined HPI above.  Her symptoms are not present now.  EKG at triage shows normal sinus rhythm with slight change in morphology in lead III as well as biphasic T waves in V1.  No other abnormality is noted when compared to EKG from 01/17/2021.  Cardiopulmonary exam reveals S1-S2 heart sounds with a great rhythm and lung sounds adequate auscultation all fields.  The patient reports that her symptoms come and go and she does not know any precipitating or alleviating events.  They are not every day.  Differential diagnosis include electrolyte abnormality, arrhythmia, ACS, TIA.  I will order a CMP to evaluate for any electrolyte abnormality.  Given that her symptoms have resolved, and her EKG is unremarkable, I will have her follow-up with the heart care if her blood work is normal.  CMP shows normal electrolytes.  Patient does have a increase in her creatinine of 1.02.  This is an interval increase versus 15 months  ago when it was 0.83.  BUN is normal as are patient's transaminases.  Etiology of the patient's symptoms is unclear though patient has no evidence of electrolyte abnormality, cardiac arrhythmia, or MI based on her evaluation urgent care today.  I will have her follow-up with her cardiologist for further evaluation and management of her symptoms.  If she has any return of chest pain with shortness of breath, dizziness, and blurred vision she should call 911 and go to the ER.   Final Clinical Impressions(s) / UC Diagnoses   Final diagnoses:  Dizziness  Shortness of breath  Chest pain, unspecified type     Discharge Instructions      Your blood work showed that you have a mild worsening of  your renal function though your electrolytes are normal.  Your EKG also did not show any signs of any arrhythmia or acute cardiac event that could explain your symptoms.  Call and make a follow-up appointment with cardiology for next week to discuss your symptoms.  If you have a return of chest pain, blurry vision, dizziness, headache, or shortness of breath I recommend you call 911 and go to the emergency department.     ED Prescriptions   None    PDMP not reviewed this encounter.   Becky Augusta, NP 04/05/23 [phone]

## 2023-05-09 ENCOUNTER — Telehealth: Payer: Self-pay | Admitting: Nurse Practitioner

## 2023-05-09 NOTE — Telephone Encounter (Signed)
 LVMTCB to schedule office visit with Canary Ceo NP.

## 2023-05-10 ENCOUNTER — Other Ambulatory Visit: Payer: Self-pay | Admitting: Family Medicine

## 2023-05-10 DIAGNOSIS — R229 Localized swelling, mass and lump, unspecified: Secondary | ICD-10-CM

## 2023-05-10 NOTE — Telephone Encounter (Signed)
 Pt needs a follow up appointment was last seen by Wert 2+ yrs ago.

## 2023-05-14 ENCOUNTER — Ambulatory Visit
Admission: RE | Admit: 2023-05-14 | Discharge: 2023-05-14 | Disposition: A | Discharge status: Home / Self Care | Source: Ambulatory Visit | Attending: Family Medicine | Admitting: Family Medicine

## 2023-05-14 DIAGNOSIS — R229 Localized swelling, mass and lump, unspecified: Secondary | ICD-10-CM | POA: Diagnosis present

## 2023-05-16 ENCOUNTER — Encounter: Payer: Self-pay | Admitting: Nurse Practitioner

## 2023-05-16 ENCOUNTER — Ambulatory Visit
Admission: RE | Admit: 2023-05-16 | Discharge: 2023-05-16 | Disposition: A | Discharge status: Home / Self Care | Source: Ambulatory Visit | Attending: Nurse Practitioner | Admitting: Nurse Practitioner

## 2023-05-16 ENCOUNTER — Other Ambulatory Visit: Payer: Self-pay | Admitting: Family Medicine

## 2023-05-16 ENCOUNTER — Ambulatory Visit (INDEPENDENT_AMBULATORY_CARE_PROVIDER_SITE_OTHER): Admitting: Nurse Practitioner

## 2023-05-16 VITALS — BP 134/60 | HR 76 | Temp 97.8°F | Ht 66.0 in | Wt 189.0 lb

## 2023-05-16 DIAGNOSIS — R0609 Other forms of dyspnea: Secondary | ICD-10-CM

## 2023-05-16 DIAGNOSIS — R042 Hemoptysis: Secondary | ICD-10-CM

## 2023-05-16 DIAGNOSIS — K1379 Other lesions of oral mucosa: Secondary | ICD-10-CM

## 2023-05-16 NOTE — Progress Notes (Signed)
 @Patient  ID: Janet [name], female    DOB: 1950-02-18, 73 y.o.   MRN: [id number]  Chief Complaint  Patient presents with   Consult    Shortness of breath on exertion.     Referring provider: Macie Saxon, MD  HPI: 73 year old female, former smoker followed for shortness of breath. She is a patient of Dr. Jacqui Mau and last seen in office 03/16/2021. Past medical history significant for HTN, hx of PVC, diastolic HF, renal mass s/p ablation (not bx), thyroid  goiter, chronic back pain.    TEST/EVENTS:  01/19/2021 echo: EF 60-65%. GIIDD. RV size and function. MR. TR moderate.  05/10/2022 CT chest: atherosclerosis. No cardiomegaly. Lungs are clear. Hypodensity right kidney consistent with 3 cm cyst.   03/16/2021: OV with Dr. Waymond Hailey. Consult for DOE. Cough is worse in the AM x 6 months. Traces of dark mucus. ? Streaks of blood. Midline CP from coughing fits. Occasional SABA use. Has some hoarseness and urge to clear throat but no active sinus symptoms otherwise. D/c ACEi and f/u in 4-6 weeks with PFTs.   05/16/2023: Today - follow up Discussed the use of AI scribe software for clinical note transcription with the patient, who gave verbal consent to proceed.  History of Present Illness   Janet [name] is a 73 year old female who presents for overdue follow up. Her PCP had sent a referral for her to re-establish care due to shortness of breath with exertion.   She feels better than the last time she was here. She does experience shortness of breath primarily during more strenuous exertion, such as walking long distances or climbing uphill. No cough or wheezing is present throughout the day.  At the end of her visit, she did mention some blood tinged sputum in the morning that she coughs up. No hemoptysis during the day. It is streaked in clear phlegm. She does have some occasional postnasal drainage. No nose bleeds. She did have a CT chest scan 05/10/2022 for reported hemoptysis; reviewed in her chart. No  suspicious nodules/masses. No acute process was identified. Has not gotten any worse.   She possesses an albuterol  inhaler but has not used it in a while.  She has a history of remote smoking, but it was a long time ago and not extensive.       Allergies  Allergen Reactions   Amlodipine Swelling    Other reaction(s): Angioedema   Shellfish Allergy Anaphylaxis   Thiazide-Type Diuretics Other (See Comments)    Other reaction(s): Other (See Comments)   Penicillins Hives and Other (See Comments)    Has patient had a PCN reaction causing immediate rash, facial/tongue/throat swelling, SOB or lightheadedness with hypotension: no Has patient had a PCN reaction causing severe rash involving mucus membranes or skin necrosis: no Has patient had a PCN reaction that required hospitalization? no Has patient had a PCN reaction occurring within the last 10 years: no If all of the above answers are "NO", then may proceed with Cephalosporin use.     Immunization History  Administered Date(s) Administered   PFIZER(Purple Top)SARS-COV-2 Vaccination 09/18/2019, 10/12/2019    Past Medical History:  Diagnosis Date   Arthritis    Asthma    Cancer (HCC)    kidney   Clotting disorder (HCC)    Hypertension    Hypertension    Kidney mass    PONV (postoperative nausea and vomiting)    Shortness of breath    Thyroid  disease  Varicose vein of leg    with leg ulcer    Tobacco History: Social History   Tobacco Use  Smoking Status Former   Current packs/day: 0.00   Types: Cigarettes   Start date: 29   Quit date: 1995   Years since quitting: 30.3   Passive exposure: Past (Long time ago i or 2 cigarettes)  Smokeless Tobacco Never  Tobacco Comments   1 cig daily--quit around 1995   Counseling given: Not Answered Tobacco comments: 1 cig daily--quit around 1995   Outpatient Medications Prior to Visit  Medication Sig Dispense Refill   albuterol  (PROVENTIL  HFA;VENTOLIN  HFA) 108 (90  BASE) MCG/ACT inhaler Inhale 2 puffs into the lungs every 6 (six) hours as needed for wheezing or shortness of breath. 1 Inhaler 0   ascorbic acid (VITAMIN C) 500 MG tablet Take 500 mg by mouth daily.     aspirin  EC 81 MG tablet Take 81 mg by mouth daily.     benzonatate  (TESSALON ) 100 MG capsule Take 1 capsule (100 mg total) by mouth every 8 (eight) hours. 21 capsule 0   Butalbital-APAP-Caffeine 50-300-40 MG CAPS take 1 to 2 capsules by mouth every 6 hours if needed for headache     Elastic Bandages & Supports (EMS ANTI-EMBOLISM STOCKINGS) MISC Apply in the morning and remove at night.     famotidine  (PEPCID ) 20 MG tablet Take 1 tablet (20 mg total) by mouth 2 (two) times daily. 30 tablet 0   lidocaine  (XYLOCAINE ) 2 % solution Use as directed 15 mLs in the mouth or throat every 3 (three) hours as needed for mouth pain. 100 mL 0   lisinopril  (ZESTRIL ) 40 MG tablet LISINOPRIL  40 MG TABS     olmesartan  (BENICAR ) 40 MG tablet Take 40 mg by mouth daily.     potassium chloride  SA (K-DUR,KLOR-CON ) 20 MEQ tablet Take 20 mEq by mouth daily.      No facility-administered medications prior to visit.     Review of Systems:   Constitutional: No weight loss or gain, night sweats, fevers, chills, fatigue, or lassitude. HEENT: No headaches, difficulty swallowing, tooth/dental problems, or sore throat. No sneezing, itching, ear ache +nasal congestion, post nasal drip CV:  No chest pain, orthopnea, PND, swelling in lower extremities, anasarca, dizziness, palpitations, syncope Resp: +minimal shortness of breath with exertion; scant hemoptysis in AM?. No excess mucus or change in color of mucus. No productive or non-productive. No wheezing.  No chest wall deformity GI:  No heartburn, indigestion, abdominal pain, nausea, vomiting, diarrhea, change in bowel habits, loss of appetite, bloody stools.  GU: No dysuria, change in color of urine, urgency or frequency.  No flank pain, no hematuria  Skin: No rash,  lesions, ulcerations MSK:  No joint pain or swelling. Neuro: No dizziness or lightheadedness.  Psych: No depression or anxiety. Mood stable.     Physical Exam:  BP 134/60 (BP Location: Left Arm, Patient Position: Sitting, Cuff Size: Normal)   Pulse 76   Temp 97.8 F (36.6 C) (Temporal)   Ht 5\' 6"  (1.676 m)   Wt 189 lb (85.7 kg)   SpO2 99%   BMI 30.51 kg/m   GEN: Pleasant, interactive, well-appearing; obese; in no acute distress HEENT:  Normocephalic and atraumatic. PERRLA. Sclera white. Nasal turbinates pink, moist and patent bilaterally. No rhinorrhea present. Oropharynx pink and moist, without exudate or edema. No lesions, ulcerations, or postnasal drip.  NECK:  Supple w/ fair ROM. No JVD present. Normal carotid impulses w/o bruits. Thyroid   symmetrical with no goiter or nodules palpated. No lymphadenopathy.   CV: RRR, no m/r/g, no peripheral edema. Pulses intact, +2 bilaterally. No cyanosis, pallor or clubbing. PULMONARY:  Unlabored, regular breathing. Clear bilaterally A&P w/o wheezes/rales/rhonchi. No accessory muscle use.  GI: BS present and normoactive. Soft, non-tender to palpation. No organomegaly or masses detected.  MSK: No erythema, warmth or tenderness. Cap refil <2 sec all extrem.  Neuro: A/Ox3. No focal deficits noted.   Skin: Warm, no lesions or rashe Psych: Normal affect and behavior. Judgement and thought content appropriate.     Lab Results:  CBC    Component Value Date/Time   WBC 9.3 01/02/2022 1904   RBC 4.39 01/02/2022 1904   HGB 12.9 01/02/2022 1904   HGB 12.8 04/12/2014 1225   HCT 40.4 01/02/2022 1904   HCT 39.2 04/12/2014 1225   PLT 277 01/02/2022 1904   PLT 324 04/12/2014 1225   MCV 92.0 01/02/2022 1904   MCV 87 04/12/2014 1225   MCH 29.4 01/02/2022 1904   MCHC 31.9 01/02/2022 1904   RDW 13.3 01/02/2022 1904   RDW 13.3 04/12/2014 1225   LYMPHSABS 2.6 01/02/2022 1904   LYMPHSABS 2.4 04/12/2014 1225   MONOABS 0.5 01/02/2022 1904   MONOABS  0.3 04/12/2014 1225   EOSABS 0.3 01/02/2022 1904   EOSABS 0.2 04/12/2014 1225   BASOSABS 0.0 01/02/2022 1904   BASOSABS 0.1 04/12/2014 1225    BMET    Component Value Date/Time   NA 137 04/05/2023 1414   NA 141 04/12/2014 1225   K 3.6 04/05/2023 1414   K 3.4 (L) 04/12/2014 1225   CL 105 04/05/2023 1414   CL 109 04/12/2014 1225   CO2 23 04/05/2023 1414   CO2 25 04/12/2014 1225   GLUCOSE 93 04/05/2023 1414   GLUCOSE 95 04/12/2014 1225   BUN 19 04/05/2023 1414   BUN 17 04/12/2014 1225   CREATININE 1.02 (H) 04/05/2023 1414   CREATININE 0.64 04/12/2014 1225   CALCIUM 9.8 04/05/2023 1414   CALCIUM 9.2 04/12/2014 1225   GFRNONAA 58 (L) 04/05/2023 1414   GFRNONAA >60 04/12/2014 1225   GFRAA >60 05/08/2019 2037   GFRAA >60 04/12/2014 1225    BNP    Component Value Date/Time   BNP 70 04/12/2014 1225     Imaging:  No results found.  Administration History     None           No data to display          No results found for: "NITRICOXIDE"      Assessment & Plan:   Dyspnea on exertion Overall seems to have mild symptom burden per her report. Possibly multifactorial related to deconditioning and body habitus. Former very remote smoker (1 cigarette a day for a few years on average); no emphysema on prior imaging. Very minimal SABA use; unclear if she receives benefit from this. She has clear lung exam today. Will set her up for PFTs for further evaluation.   Patient Instructions  Continue Albuterol  inhaler 2 puffs every 6 hours as needed for shortness of breath or wheezing. Notify if symptoms persist despite rescue inhaler/neb use.   We will get you set up for lung function testing to assess for any potential lung disease contributing to your shortness of breath  Recommend working on graded exercises   Chest x ray today for follow up  Use saline nasal spray 2-3 times a day and saline nasal gel at bedtime. See if this improves or  resolves the blood tinge  phlegm in the morning.   Follow up after PFT with new pulmonologist or Katie Dolph Tavano,NP. If symptoms do not improve or worsen, please contact office for sooner follow up or seek emergency care.    Hemoptysis Reports of scant hemoptysis in AM. No mass hemoptysis or cough throughout the day. Ongoing for >2 years now. Prior CT imaging with contrast (2023) and without (2024) without any nodules/masses or pulmonary disease. Question sinus component? Advised her to use saline nasal spray 2-3 times a day and monitor response. She is not on any anticoagulation. If she continues, could consider bronchoscopy for further evaluation. CXR today.    Advised if symptoms do not improve or worsen, to please contact office for sooner follow up or seek emergency care.   I spent 35 minutes of dedicated to the care of this patient on the date of this encounter to include pre-visit review of records, face-to-face time with the patient discussing conditions above, post visit ordering of testing, clinical documentation with the electronic health record, making appropriate referrals as documented, and communicating necessary findings to members of the patients care team.  Roetta Clarke, NP 05/16/2023  Pt aware and understands NP's role.

## 2023-05-16 NOTE — Patient Instructions (Addendum)
 Continue Albuterol  inhaler 2 puffs every 6 hours as needed for shortness of breath or wheezing. Notify if symptoms persist despite rescue inhaler/neb use.   We will get you set up for lung function testing to assess for any potential lung disease contributing to your shortness of breath  Recommend working on graded exercises   Chest x ray today for follow up  Use saline nasal spray 2-3 times a day and saline nasal gel at bedtime. See if this improves or resolves the blood tinge phlegm in the morning.   Follow up after PFT with new pulmonologist or Katie Ledarrius Beauchaine,NP. If symptoms do not improve or worsen, please contact office for sooner follow up or seek emergency care.

## 2023-05-16 NOTE — Assessment & Plan Note (Addendum)
 Overall seems to have mild symptom burden per her report. Possibly multifactorial related to deconditioning and body habitus. Former very remote smoker (1 cigarette a day for a few years on average); no emphysema on prior imaging. Very minimal SABA use; unclear if she receives benefit from this. She has clear lung exam today. Will set her up for PFTs for further evaluation.   Patient Instructions  Continue Albuterol  inhaler 2 puffs every 6 hours as needed for shortness of breath or wheezing. Notify if symptoms persist despite rescue inhaler/neb use.   We will get you set up for lung function testing to assess for any potential lung disease contributing to your shortness of breath  Recommend working on graded exercises   Chest x ray today for follow up  Use saline nasal spray 2-3 times a day and saline nasal gel at bedtime. See if this improves or resolves the blood tinge phlegm in the morning.   Follow up after PFT with new pulmonologist or Janet Lemya Greenwell,NP. If symptoms do not improve or worsen, please contact office for sooner follow up or seek emergency care.

## 2023-05-16 NOTE — Assessment & Plan Note (Signed)
 Reports of scant hemoptysis in AM. No mass hemoptysis or cough throughout the day. Ongoing for >2 years now. Prior CT imaging with contrast (2023) and without (2024) without any nodules/masses or pulmonary disease. Question sinus component? Advised her to use saline nasal spray 2-3 times a day and monitor response. She is not on any anticoagulation. If she continues, could consider bronchoscopy for further evaluation. CXR today.

## 2023-05-20 ENCOUNTER — Other Ambulatory Visit: Payer: Self-pay | Admitting: Radiology

## 2023-05-20 NOTE — Progress Notes (Signed)
 Patient will be new to me.  Agree with the details of the visit as noted by Girard Lam, NP.  Acey Ace, MD Advanced Bronchoscopy P[facility]

## 2023-05-21 ENCOUNTER — Other Ambulatory Visit: Payer: Self-pay | Admitting: Radiology

## 2023-05-21 NOTE — Progress Notes (Signed)
 Janet Griffes, MD sent to [facility] to schedule US  guided core biopsy of cheek nodule.  Recommend setting up for sedation based on the location.  Please ask IR doc that will be doing the procedure if they want cytology for FNA.  [place]

## 2023-05-21 NOTE — H&P (Signed)
 Chief Complaint: Left cheek nodule - IR consulted for image guided biopsy of nodule  Referring Provider(s): Macie Saxon, MD   Supervising Physician: Fernando Hoyer  Patient Status: [facility] - Out-pt  History of Present Illness: Janet [name] is a 73 y.o. female with pmhx pre-DM, chronic PVD. Presenting to IR service for biopsy of left cheek nodule. Per chart review, this nodule was first documented 08/16/2021.  PCP note states there was an initial referral for biopsy but this was cancelled as patient never followed up on it, now being referred again for biopsy. On initial presentation pt had US  on 08/16/2021 of the nodule with report as follows:   FINDINGS:    Round, primarily hypoechoic lesion at the palpable area of concern in the left cheek measures 1.2 x 1.2 x 1.3 cm. There is internal vascularity.  Contralateral views of the right cheek are unremarkable.   *** Patient is Full Code  Past Medical History:  Diagnosis Date   Arthritis    Asthma    Cancer (HCC)    kidney   Clotting disorder (HCC)    Hypertension    Hypertension    Kidney mass    PONV (postoperative nausea and vomiting)    Shortness of breath    Thyroid  disease    Varicose vein of leg    with leg ulcer    Past Surgical History:  Procedure Laterality Date   ABDOMINAL HYSTERECTOMY     BREAST BIOPSY Left 05/02/2022   Us  Core Bx, - path pending   COLONOSCOPY WITH PROPOFOL  N/A 05/25/2019   Procedure: COLONOSCOPY WITH PROPOFOL ;  Surgeon: Selena Daily, MD;  Location: [facility] ENDOSCOPY;  Service: Gastroenterology;  Laterality: N/A;   IR RADIOLOGIST EVAL & MGMT  11/02/2021   RADIOLOGY WITH ANESTHESIA Left 12/20/2021   Procedure: Cryoablation of left renal mass;  Surgeon: Mir, Knox Perl, MD;  Location: [facility];  Service: Radiology;  Laterality: Left;   VARICOSE VEIN SURGERY      Allergies: Amlodipine, Shellfish allergy, Thiazide-type diuretics, and Penicillins  Medications: Prior to Admission  medications   Medication Sig Start Date End Date Taking? Authorizing Provider  albuterol  (PROVENTIL  HFA;VENTOLIN  HFA) 108 (90 BASE) MCG/ACT inhaler Inhale 2 puffs into the lungs every 6 (six) hours as needed for wheezing or shortness of breath. 12/24/14   Goodman, Graydon, MD  ascorbic acid (VITAMIN C) 500 MG tablet Take 500 mg by mouth daily.    [provider]  aspirin  EC 81 MG tablet Take 81 mg by mouth daily.    [provider]  benzonatate  (TESSALON ) 100 MG capsule Take 1 capsule (100 mg total) by mouth every 8 (eight) hours. 05/06/22   Mayer, Jodi R, NP  Butalbital-APAP-Caffeine 50-300-40 MG CAPS take 1 to 2 capsules by mouth every 6 hours if needed for headache 07/30/17   [provider]  Elastic Bandages & Supports (EMS ANTI-EMBOLISM STOCKINGS) MISC Apply in the morning and remove at night. 06/23/14   [provider]  famotidine  (PEPCID ) 20 MG tablet Take 1 tablet (20 mg total) by mouth 2 (two) times daily. 08/23/21   Brimage, Vondra, DO  lidocaine  (XYLOCAINE ) 2 % solution Use as directed 15 mLs in the mouth or throat every 3 (three) hours as needed for mouth pain. 08/23/21   Brimage, Vondra, DO  lisinopril  (ZESTRIL ) 40 MG tablet LISINOPRIL  40 MG TABS 01/13/18   [provider]  olmesartan  (BENICAR ) 40 MG tablet Take 40 mg by mouth daily. 11/25/21  [provider]  potassium chloride  SA (K-DUR,KLOR-CON ) 20 MEQ tablet Take 20 mEq by mouth daily.     [provider]  linaclotide  (LINZESS ) 145 MCG CAPS capsule Take 1 capsule (145 mcg total) by mouth daily before breakfast. 09/30/19 05/30/20  Selena Daily, MD     Family History  Problem Relation Age of Onset   Colon cancer Mother 16   Hypertension Mother    Heart attack Father 26   Hypertension Father    Hyperlipidemia Father    Diabetes Father    Heart attack Brother    Heart attack Brother    Heart attack Daughter    Heart Problems Son    Stroke Neg Hx    Breast cancer Neg  Hx     Social History   Socioeconomic History   Marital status: Widowed    Spouse name: Not on file   Number of children: Not on file   Years of education: Not on file   Highest education level: Not on file  Occupational History   Not on file  Tobacco Use   Smoking status: Former    Current packs/day: 0.00    Types: Cigarettes    Start date: 23    Quit date: 53    Years since quitting: 30.3    Passive exposure: Past (Long time ago i or 2 cigarettes)   Smokeless tobacco: Never   Tobacco comments:    1 cig daily--quit around 1995  Vaping Use   Vaping status: Never Used  Substance and Sexual Activity   Alcohol use: No   Drug use: No   Sexual activity: Not on file  Other Topics Concern   Not on file  Social History Narrative   Not on file   Social Drivers of Health   Financial Resource Strain: Low Risk  (04/10/2021)   Overall Financial Resource Strain (CARDIA)    Difficulty of Paying Living Expenses: Not hard at all  Food Insecurity: No Food Insecurity (04/10/2021)   Hunger Vital Sign    Worried About Running Out of Food in the Last Year: Never true    Ran Out of Food in the Last Year: Never true  Transportation Needs: No Transportation Needs (04/10/2021)   PRAPARE - Administrator, Civil Service (Medical): No    Lack of Transportation (Non-Medical): No  Physical Activity: Inactive (04/10/2021)   Exercise Vital Sign    Days of Exercise per Week: 0 days    Minutes of Exercise per Session: 0 min  Stress: No Stress Concern Present (04/10/2021)   [employer] of Occupational Health - Occupational Stress Questionnaire    Feeling of Stress : Only a little  Social Connections: Socially Integrated (04/10/2021)   Social Connection and Isolation Panel [NHANES]    Frequency of Communication with Friends and Family: Three times a week    Frequency of Social Gatherings with Friends and Family: Three times a week    Attends Religious Services: 1 to 4 times per  year    Active Member of Clubs or Organizations: No    Attends Banker Meetings: 1 to 4 times per year    Marital Status: Married     Review of Systems: A 12 point ROS discussed and pertinent positives are indicated in the HPI above.  All other systems are negative.  Review of Systems  Vital Signs: There were no vitals taken for this visit.  Advance Care Plan: The advanced care place/surrogate decision maker  was discussed at the time of visit and the patient did not wish to discuss or was not able to name a surrogate decision maker or provide an advance care plan.  Physical Exam  Imaging: US  SOFT TISSUE HEAD & NECK (NON-THYROID ) Result Date: 05/20/2023 CLINICAL DATA:  Subcutaneous nodule of the left maxilla EXAM: ULTRASOUND OF HEAD/NECK SOFT TISSUES TECHNIQUE: Ultrasound examination of the head and neck soft tissues was performed in the area of clinical concern. COMPARISON:  None available FINDINGS: Targeted sonographic evaluation of the left maxillary region demonstrates a solid heterogeneous hypoechoic mass measuring 1.3 x 1.1 x 1.3 cm IMPRESSION: 1.3 cm heterogeneous solid nodule corresponds to the palpable abnormality of the left maxillary region. This is of uncertain etiology but may be due to abnormally enlarged, partially necrotic lymph node. Further evaluation with contrast-enhanced CT is recommended. Electronically Signed   By: Elester Grim M.D.   On: 05/20/2023 14:57   DG Chest 2 View Result Date: 05/20/2023 CLINICAL DATA:  Dyspnea on exertion. EXAM: CHEST - 2 VIEW COMPARISON:  06/29/2022. FINDINGS: Cardiac silhouette is normal in size and configuration. Normal mediastinal and hilar contours. Lungs are hyperexpanded, but clear. No pleural effusion or pneumothorax. Skeletal structures are intact. IMPRESSION: No active cardiopulmonary disease. Electronically Signed   By: Amanda Jungling M.D.   On: 05/20/2023 09:53    Labs:  CBC: No results for input(s): "WBC", "HGB",  "HCT", "PLT" in the last 8760 hours.  COAGS: No results for input(s): "INR", "APTT" in the last 8760 hours.  BMP: Recent Labs    04/05/23 1414  NA 137  K 3.6  CL 105  CO2 23  GLUCOSE 93  BUN 19  CALCIUM 9.8  CREATININE 1.02*  GFRNONAA 58*    LIVER FUNCTION TESTS: Recent Labs    04/05/23 1414  BILITOT 1.0  AST 21  ALT 13  ALKPHOS 45  PROT 8.5*  ALBUMIN 4.1    TUMOR MARKERS: No results for input(s): "AFPTM", "CEA", "CA199", "CHROMGRNA" in the last 8760 hours.  Assessment and Plan:  Janet [name] is a 74 y.o. female with pmhx pre-DM, chronic PVD. Presenting to IR service for biopsy of left cheek nodule. Per chart review, this nodule was first documented 08/16/2021.  PCP note states there was an initial referral for biopsy but this was cancelled as patient never followed up on it, now being referred again for biopsy. On initial presentation pt had US  on 08/16/2021 of the nodule with report as follows:   FINDINGS:    Round, primarily hypoechoic lesion at the palpable area of concern in the left cheek measures 1.2 x 1.2 x 1.3 cm. There is internal vascularity.  Contralateral views of the right cheek are unremarkable.    Risks and benefits of left cheek nodule biopsy was discussed with the patient and/or patient's family including, but not limited to bleeding, infection, damage to adjacent structures or low yield requiring additional tests.  All of the questions were answered and there is agreement to proceed.  Consent signed and in chart.   Thank you for allowing our service to participate in Janet [name] 's care.  Electronically Signed: Nicolasa Barrett, PA-C   05/21/2023, 9:15 AM      I spent a total of {New [id number]} {New Out-Pt:[id number]}  {Established Out-Pt:[id number]} in face to face in clinical consultation, greater than 50% of which was counseling/coordinating care for left cheek nodule biopsy.

## 2023-05-21 NOTE — Progress Notes (Signed)
 Patient for US  guided core RT cheek nodule biopsy on Wed 05/22/23, I called and spoke with the patient on the phone and gave pre-procedure instructions. Pt was made aware to be here at 10a, NPO after MN prior to procedure as well as driver post procedure/recovery/discharge. Pt stated understanding.  Called 05/21/23

## 2023-05-22 ENCOUNTER — Ambulatory Visit
Admission: RE | Admit: 2023-05-22 | Discharge: 2023-05-22 | Disposition: A | Discharge status: Home / Self Care | Source: Ambulatory Visit | Attending: Family Medicine | Admitting: Family Medicine

## 2023-05-22 ENCOUNTER — Other Ambulatory Visit: Payer: Self-pay | Admitting: Family Medicine

## 2023-05-22 ENCOUNTER — Other Ambulatory Visit: Payer: Self-pay

## 2023-05-22 DIAGNOSIS — Z87891 Personal history of nicotine dependence: Secondary | ICD-10-CM | POA: Insufficient documentation

## 2023-05-22 DIAGNOSIS — K1379 Other lesions of oral mucosa: Secondary | ICD-10-CM

## 2023-05-22 DIAGNOSIS — E1151 Type 2 diabetes mellitus with diabetic peripheral angiopathy without gangrene: Secondary | ICD-10-CM | POA: Diagnosis not present

## 2023-05-22 DIAGNOSIS — R220 Localized swelling, mass and lump, head: Secondary | ICD-10-CM | POA: Diagnosis present

## 2023-05-22 MED ORDER — FENTANYL CITRATE (PF) 100 MCG/2ML IJ SOLN
INTRAMUSCULAR | Status: AC | PRN
  Administered 2023-05-22: 50 ug via INTRAVENOUS

## 2023-05-22 MED ORDER — MIDAZOLAM HCL 2 MG/2ML IJ SOLN
INTRAMUSCULAR | Status: AC | PRN
  Administered 2023-05-22: 1 mg via INTRAVENOUS

## 2023-05-22 MED ORDER — SODIUM CHLORIDE 0.9 % IV SOLN: INTRAVENOUS | Status: DC

## 2023-05-22 MED ORDER — LIDOCAINE HCL (PF) 1 % IJ SOLN
2.0000 mL | Freq: Once | INTRAMUSCULAR | Status: AC
  Administered 2023-05-22: 2 mL via INTRADERMAL
  Filled 2023-05-22: qty 2

## 2023-05-22 MED ORDER — FENTANYL CITRATE (PF) 100 MCG/2ML IJ SOLN
INTRAMUSCULAR | Status: AC
  Filled 2023-05-22: qty 2

## 2023-05-22 MED ORDER — MIDAZOLAM HCL 2 MG/2ML IJ SOLN
INTRAMUSCULAR | Status: AC
  Filled 2023-05-22: qty 2

## 2023-05-22 NOTE — Procedures (Signed)
 Interventional Radiology Procedure Note  Procedure: US  guided FNA and core biopsy of left facial LN  Complications: None  Estimated Blood Loss: None  Recommendations: - DC home   Signed,  Roxie Cord, MD

## 2023-05-23 LAB — CYTOLOGY - NON PAP

## 2023-05-24 LAB — SURGICAL PATHOLOGY

## 2023-06-21 ENCOUNTER — Encounter: Payer: Self-pay | Admitting: Pulmonary Disease

## 2023-06-21 ENCOUNTER — Other Ambulatory Visit
Admission: RE | Admit: 2023-06-21 | Discharge: 2023-06-21 | Disposition: A | Discharge status: Home / Self Care | Source: Ambulatory Visit | Attending: Pulmonary Disease | Admitting: Pulmonary Disease

## 2023-06-21 ENCOUNTER — Ambulatory Visit: Admitting: Pulmonary Disease

## 2023-06-21 VITALS — BP 114/60 | HR 72 | Temp 98.2°F | Ht 66.0 in | Wt 189.0 lb

## 2023-06-21 DIAGNOSIS — R0601 Orthopnea: Secondary | ICD-10-CM | POA: Diagnosis not present

## 2023-06-21 DIAGNOSIS — I071 Rheumatic tricuspid insufficiency: Secondary | ICD-10-CM | POA: Diagnosis not present

## 2023-06-21 DIAGNOSIS — R0609 Other forms of dyspnea: Secondary | ICD-10-CM

## 2023-06-21 DIAGNOSIS — I5189 Other ill-defined heart diseases: Secondary | ICD-10-CM

## 2023-06-21 LAB — PULMONARY FUNCTION TEST
DL/VA % pred: 95 %
DL/VA: 3.89 ml/min/mmHg/L
DLCO unc % pred: 85 %
DLCO unc: 17.47 ml/min/mmHg
FEF 25-75 Post: 2.21 L/s
FEF 25-75 Pre: 2.11 L/s
FEF2575-%Change-Post: 4 %
FEF2575-%Pred-Post: 117 %
FEF2575-%Pred-Pre: 112 %
FEV1-%Change-Post: 1 %
FEV1-%Pred-Post: 94 %
FEV1-%Pred-Pre: 93 %
FEV1-Post: 2.24 L
FEV1-Pre: 2.22 L
FEV1FVC-%Change-Post: 1 %
FEV1FVC-%Pred-Pre: 103 %
FEV6-%Change-Post: 0 %
FEV6-%Pred-Post: 93 %
FEV6-%Pred-Pre: 94 %
FEV6-Post: 2.81 L
FEV6-Pre: 2.83 L
FEV6FVC-%Change-Post: 0 %
FEV6FVC-%Pred-Post: 103 %
FEV6FVC-%Pred-Pre: 104 %
FVC-%Change-Post: 0 %
FVC-%Pred-Post: 90 %
FVC-%Pred-Pre: 90 %
FVC-Post: 2.83 L
FVC-Pre: 2.84 L
Post FEV1/FVC ratio: 79 %
Post FEV6/FVC ratio: 99 %
Pre FEV1/FVC ratio: 78 %
Pre FEV6/FVC Ratio: 100 %
RV % pred: 107 %
RV: 2.53 L
TLC % pred: 97 %
TLC: 5.22 L

## 2023-06-21 LAB — COMPREHENSIVE METABOLIC PANEL WITH GFR
ALT: 13 U/L (ref 0–44)
AST: 21 U/L (ref 15–41)
Albumin: 3.9 g/dL (ref 3.5–5.0)
Alkaline Phosphatase: 42 U/L (ref 38–126)
Anion gap: 10 (ref 5–15)
BUN: 18 mg/dL (ref 8–23)
CO2: 23 mmol/L (ref 22–32)
Calcium: 9.7 mg/dL (ref 8.9–10.3)
Chloride: 107 mmol/L (ref 98–111)
Creatinine, Ser: 0.82 mg/dL (ref 0.44–1.00)
GFR, Estimated: >60 mL/min (ref >60)
Glucose, Bld: 106 mg/dL — ABNORMAL HIGH (ref 70–99)
Potassium: 3.7 mmol/L (ref 3.5–5.1)
Sodium: 140 mmol/L (ref 135–145)
Total Bilirubin: 0.9 mg/dL (ref 0.0–1.2)
Total Protein: 7.6 g/dL (ref 6.5–8.1)

## 2023-06-21 LAB — CBC WITH DIFFERENTIAL/PLATELET
Abs Immature Granulocytes: 0.02 10*3/uL (ref 0.00–0.07)
Basophils Absolute: 0 10*3/uL (ref 0.0–0.1)
Basophils Relative: 0 %
Eosinophils Absolute: 0.1 10*3/uL (ref 0.0–0.5)
Eosinophils Relative: 1 %
HCT: 38 % (ref 36.0–46.0)
Hemoglobin: 12.1 g/dL (ref 12.0–15.0)
Immature Granulocytes: 0 %
Lymphocytes Relative: 25 %
Lymphs Abs: 1.5 10*3/uL (ref 0.7–4.0)
MCH: 33.2 pg (ref 26.0–34.0)
MCHC: 31.8 g/dL (ref 30.0–36.0)
MCV: 104.4 fL — ABNORMAL HIGH (ref 80.0–100.0)
Monocytes Absolute: 0.3 10*3/uL (ref 0.1–1.0)
Monocytes Relative: 5 %
Neutro Abs: 4.2 10*3/uL (ref 1.7–7.7)
Neutrophils Relative %: 69 %
Platelets: 246 10*3/uL (ref 150–400)
RBC: 3.64 MIL/uL — ABNORMAL LOW (ref 3.87–5.11)
RDW: 13.2 % (ref 11.5–15.5)
WBC: 6.2 10*3/uL (ref 4.0–10.5)
nRBC: 0 % (ref 0.0–0.2)

## 2023-06-21 LAB — NITRIC OXIDE: Nitric Oxide: 20

## 2023-06-21 NOTE — Patient Instructions (Signed)
 Full PFT completed today ? ?

## 2023-06-21 NOTE — Patient Instructions (Signed)
 VISIT SUMMARY:  During your visit, we discussed your ongoing shortness of breath and fatigue, which have been worsening over the past few months. We also reviewed your asthma management and concerns about your kidney function.  YOUR PLAN:  -SHORTNESS OF BREATH AND FATIGUE: Your symptoms of shortness of breath and fatigue, along with difficulty breathing when lying flat and swelling in one foot, may be related to heart issues. We will perform an echocardiogram to check your heart function and order blood tests to look for anemia and kidney problems.  -ASTHMA: Asthma is a condition where your airways narrow and swell, causing wheezing and shortness of breath. You are currently using an albuterol  inhaler as needed. We will continue to monitor your symptoms and adjust your treatment if necessary.  -KIDNEY CANCER: Given your history of kidney cancer, we need to check your kidney function. We will order blood tests to evaluate how well your kidneys are working.  INSTRUCTIONS:  Please schedule an echocardiogram and complete the blood tests as soon as possible. Follow up with us  once the results are available so we can discuss the next steps.

## 2023-06-21 NOTE — Progress Notes (Signed)
 Full PFT completed today ? ?

## 2023-06-21 NOTE — Progress Notes (Signed)
 Subjective:    Patient ID: Janet [name], female    DOB: 1950/11/09, 73 y.o.   MRN: [id number]  Patient Care Team: Macie Saxon, MD as PCP - General (Family Medicine) Vonna Guardian, Donald Frost, MD as Consulting Physician (Vascular Surgery) Timmy Forbes, MD as Consulting Physician (Hematology and Oncology) Wenona Hamilton, MD as Consulting Physician (Cardiology)  Chief Complaint  Patient presents with   Follow-up    Review PFT from today.  Patient states doing well.  No sx noted.    BACKGROUND: Patient is a 73 year old who follows for the issue of dyspnea on exertion.  She was initially evaluated by Dr. Vernestine Gondola on 16 March 2021 and did not follow after that.  Subsequently saw Canary Ceo, NP on 16 May 2023 and at that time prescribed as needed SABA.  The patient also reported minor hemoptysis which was believed to actually occur from sinus disease.  This is her first visit with me today.  She had PFTs.  HPI Discussed the use of AI scribe software for clinical note transcription with the patient, who gave verbal consent to proceed.  History of Present Illness   Janet [name] is a 73 year old female with a past history of asthma who presents with shortness of breath.  She has been experiencing significant fatigue and shortness of breath for the past four to five months, which have progressively worsened. These symptoms make it difficult for her to climb stairs without needing to stop and rest. She also experiences orthopnea, requiring four pillows to sleep due to breathing difficulties when lying flat. She describes a sensation of being unable to breathe when lying back.  She mentions occasional coughing and slight wheezing, for which she uses an albuterol  inhaler as needed. Despite this, her symptoms persist, raising concerns about her asthma management.  She notes unilateral foot swelling, with one foot remaining consistently swollen. This symptom, along with her breathing difficulties, has raised  concerns about potential underlying cardiac issues.  She has been evaluated by cardiology in the past (Dr. Alvenia Aus).  Echocardiogram in 2023 showed normal LV function, grade 2 diastolic dysfunction and mitral regurgitation.  She has not been seen by cardiology since that time.  Coronary CT showed minimal nonobstructive CAD.  Her past medical history includes kidney cancer, and she expresses concern about her kidney function, which has not been recently evaluated.  She follows with Dr. Wilhelmenia Harada for this issue.  As noted above she had complained of minor hemoptysis at her May visit here however she does not endorse this symptom at this time.  Chest x-ray obtained on 16 May 2023 showed no active cardiopulmonary disease.  Patient underwent PFTs today.  This showed FEV1 of 2.22 L or 93% predicted FVC of 2.84 L or 90% predicted, FEV1/FVC of 78%, normal lung volumes, normal diffusion capacity.  Findings consistent with very minimal obstructive airways disease by the curvature and the flow-volume loop.  Otherwise testing is normal.  The test does not explain the severity of symptoms patient is experiencing.    Review of Systems A 10 point review of systems was performed and it is as noted above otherwise negative.   Past Medical History:  Diagnosis Date   Arthritis    Asthma    Cancer (HCC)    kidney   Clotting disorder (HCC)    Hypertension    Hypertension    Kidney mass    PONV (postoperative nausea and vomiting)    Shortness of breath  Thyroid  disease    Varicose vein of leg    with leg ulcer    Past Surgical History:  Procedure Laterality Date   ABDOMINAL HYSTERECTOMY     BREAST BIOPSY Left 05/02/2022   Us  Core Bx, - path pending   COLONOSCOPY WITH PROPOFOL  N/A 05/25/2019   Procedure: COLONOSCOPY WITH PROPOFOL ;  Surgeon: Selena Daily, MD;  Location: [facility];  Service: Gastroenterology;  Laterality: N/A;   IR RADIOLOGIST EVAL & MGMT  11/02/2021   RADIOLOGY WITH ANESTHESIA Left  12/20/2021   Procedure: Cryoablation of left renal mass;  Surgeon: Mir, Knox Perl, MD;  Location: [facility];  Service: Radiology;  Laterality: Left;   VARICOSE VEIN SURGERY      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   Left hip pain 11/01/2017   Headache disorder 08/29/2017   Premature ventricular contractions 05/25/2016   Asthma in adult without complication 04/23/2016   Heart palpitations 04/23/2016   History of kidney cancer 04/23/2016   Bilateral cellulitis of lower leg 10/03/2015   Renal mass, left 02/24/2015   Thrombophlebitis 09/29/2014   Chest pressure 09/12/2014   Dyspnea on exertion 09/12/2014   Generalized weakness 09/09/2014   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    Family History  Problem Relation Age of Onset   Colon cancer Mother 56   Hypertension Mother    Heart attack Father 73   Hypertension Father    Hyperlipidemia Father    Diabetes Father    Heart attack Brother    Heart attack Brother    Heart attack Daughter    Heart Problems Son    Stroke Neg Hx    Breast cancer Neg Hx     Social History   Tobacco Use   Smoking status: Former    Current packs/day: 0.00    Types: Cigarettes    Start date: 1990    Quit date: 1995    Years since quitting: 30.4    Passive exposure: Past (Long time ago i or 2 cigarettes)   Smokeless tobacco: Never   Tobacco comments:    1 cig daily--quit around 1995  Substance Use Topics   Alcohol use: No    Allergies  Allergen Reactions   Amlodipine Swelling    Other reaction(s): Angioedema   Shellfish Allergy Anaphylaxis   Thiazide-Type Diuretics Other (See Comments)    Other reaction(s): Other (See Comments)   Penicillins Hives and Other (See Comments)     Has patient had a PCN reaction causing immediate rash, facial/tongue/throat swelling, SOB or lightheadedness with hypotension: no Has patient had a PCN reaction causing severe rash involving mucus membranes or skin necrosis: no Has patient had a PCN reaction that required hospitalization? no Has patient had a PCN reaction occurring within the last 10 years: no If all of the above answers are NO, then may proceed with Cephalosporin use.     Current Meds  Medication Sig   albuterol  (PROVENTIL  HFA;VENTOLIN  HFA) 108 (90 BASE) MCG/ACT inhaler Inhale 2 puffs into the lungs every 6 (six) hours as needed for wheezing or shortness of breath.   ascorbic acid (VITAMIN C) 500 MG tablet Take 500 mg by mouth daily.   aspirin  EC 81 MG tablet Take 81 mg by  mouth daily.   benzonatate  (TESSALON ) 100 MG capsule Take 1 capsule (100 mg total) by mouth every 8 (eight) hours.   Butalbital-APAP-Caffeine 50-300-40 MG CAPS    Elastic Bandages & Supports (EMS ANTI-EMBOLISM STOCKINGS) MISC Apply in the morning and remove at night.   famotidine  (PEPCID ) 20 MG tablet Take 1 tablet (20 mg total) by mouth 2 (two) times daily.   lidocaine  (XYLOCAINE ) 2 % solution Use as directed 15 mLs in the mouth or throat every 3 (three) hours as needed for mouth pain.   lisinopril  (ZESTRIL ) 40 MG tablet LISINOPRIL  40 MG TABS   olmesartan  (BENICAR ) 40 MG tablet Take 40 mg by mouth daily.   potassium chloride  SA (K-DUR,KLOR-CON ) 20 MEQ tablet Take 20 mEq by mouth daily.     Immunization History  Administered Date(s) Administered   PFIZER(Purple Top)SARS-COV-2 Vaccination 09/18/2019, 10/12/2019        Objective:     BP 114/60 (BP Location: Right Arm, Patient Position: Sitting, Cuff Size: Normal)   Pulse 72   Temp 98.2 F (36.8 C) (Oral)   Ht 5' 6 (1.676 m)   Wt 189 lb (85.7 kg)   SpO2 100%   BMI 30.51 kg/m   SpO2: 100 %  GENERAL: Well-developed, well-nourished woman, no acute distress.  Fully ambulatory, no  conversational dyspnea. HEAD: Normocephalic, atraumatic.  EYES: Pupils equal, round, reactive to light.  No scleral icterus.  MOUTH: Dentition intact, oral mucosa moist.  No thrush. NECK: Supple. No thyromegaly. Trachea midline. No JVD.  No adenopathy. PULMONARY: Good air entry bilaterally.  No adventitious sounds. CARDIOVASCULAR: S1 and S2. Regular rate and rhythm.  Grade 1-2/6 mitral regurg murmur. ABDOMEN: Benign. MUSCULOSKELETAL: No joint deformity, no clubbing, no edema.  NEUROLOGIC: No overt focal deficit, no gait disturbance, speech is fluent. SKIN: Intact,warm,dry. PSYCH: Mood and behavior normal.  Ambulatory oxymetry was performed today:  At rest on room air oxygen saturation was 99%, the patient ambulated at a moderate pace, completed 3 laps, O2 nadir 96%, no shortness of breath.  Resting heart rate was 75 bpm at maximum for this exercise 97 bpm.  Normal study.  Lab Results  Component Value Date   NITRICOXIDE 20 06/21/2023  *No evidence of type II inflammation     Assessment & Plan:     ICD-10-CM   1. DOE (dyspnea on exertion)  R06.09 ECHOCARDIOGRAM COMPLETE    CBC with Differential/Platelet    Comp Met (CMET)    2. Orthopnea  R06.01 ECHOCARDIOGRAM COMPLETE    3. Grade II diastolic dysfunction  I51.89     4. Tricuspid valve insufficiency, unspecified etiology  I07.1       Orders Placed This Encounter  Procedures   CBC with Differential/Platelet    Standing Status:   Future    Number of Occurrences:   1    Expected Date:   06/21/2023    Expiration Date:   06/20/2024   Comp Met (CMET)    Standing Status:   Future    Number of Occurrences:   1    Expiration Date:   06/20/2024   ECHOCARDIOGRAM COMPLETE    Standing Status:   Future    Expected Date:   07/08/2023    Expiration Date:   06/20/2024    Where should this test be performed:   [facility]    Please indicate who you request to read the nuc med / echo results.:   [facility]    Perflutren DEFINITY  (image enhancing agent)  should be administered unless hypersensitivity or allergy exist:   Administer Perflutren    Reason for exam-Echo:   Dyspnea  R06.00   Discussion:    Shortness of breath and fatigue Experiencing shortness of breath and fatigue for several months, worsening over the last 4-5 months. Orthopnea and unilateral leg swelling suggest a possible cardiac etiology. Lung function tests are normal at 93-94%. Differential diagnosis includes cardiac issues, anemia, and kidney function abnormalities. - Order echocardiogram to assess cardiac function - Order blood work to evaluate for anemia and kidney function abnormalities  History of asthma History of asthma with occasional wheezing and cough. Currently using albuterol  inhaler as needed.  Nitric oxide  shows no evidence of type II inflammation.  Kidney cancer Kidney cancer with no recent evaluation of kidney function. - Order blood work to assess kidney function      Advised if symptoms do not improve or worsen, to please contact office for sooner follow up or seek emergency care.    I spent 40 minutes of dedicated to the care of this patient on the date of this encounter to include pre-visit review of records, face-to-face time with the patient discussing conditions above, post visit ordering of testing, clinical documentation with the electronic health record, making appropriate referrals as documented, and communicating necessary findings to members of the patients care team.   C. Chloe Counter, MD [facility]    *This note was dictated using voice recognition software/Dragon.  Despite best efforts to proofread, errors can occur which can change the meaning. Any transcriptional errors that result from this process are unintentional and may not be fully corrected at the time of dictation.

## 2023-06-21 NOTE — Addendum Note (Signed)
 Addended by: Nzinga Ferran M on: 06/21/2023 11:21 AM   Modules accepted: Orders

## 2023-06-28 ENCOUNTER — Encounter: Payer: Self-pay | Admitting: Pulmonary Disease

## 2023-07-18 ENCOUNTER — Emergency Department

## 2023-07-18 ENCOUNTER — Emergency Department
Admission: EM | Admit: 2023-07-18 | Discharge: 2023-07-18 | Disposition: A | Discharge status: Home / Self Care | Attending: Emergency Medicine | Admitting: Emergency Medicine

## 2023-07-18 ENCOUNTER — Other Ambulatory Visit: Payer: Self-pay

## 2023-07-18 DIAGNOSIS — R202 Paresthesia of skin: Secondary | ICD-10-CM | POA: Insufficient documentation

## 2023-07-18 DIAGNOSIS — R079 Chest pain, unspecified: Secondary | ICD-10-CM | POA: Insufficient documentation

## 2023-07-18 LAB — TROPONIN I (HIGH SENSITIVITY)
Troponin I (High Sensitivity): 3 ng/L (ref <18)
Troponin I (High Sensitivity): 3 ng/L (ref <18)

## 2023-07-18 LAB — CBC
HCT: 37.6 % (ref 36.0–46.0)
Hemoglobin: 12.2 g/dL (ref 12.0–15.0)
MCH: 33.8 pg (ref 26.0–34.0)
MCHC: 32.4 g/dL (ref 30.0–36.0)
MCV: 104.2 fL — ABNORMAL HIGH (ref 80.0–100.0)
Platelets: 259 10*3/uL (ref 150–400)
RBC: 3.61 MIL/uL — ABNORMAL LOW (ref 3.87–5.11)
RDW: 13.2 % (ref 11.5–15.5)
WBC: 5.6 10*3/uL (ref 4.0–10.5)
nRBC: 0 % (ref 0.0–0.2)

## 2023-07-18 LAB — BASIC METABOLIC PANEL WITH GFR
Anion gap: 9 (ref 5–15)
BUN: 17 mg/dL (ref 8–23)
CO2: 25 mmol/L (ref 22–32)
Calcium: 9.8 mg/dL (ref 8.9–10.3)
Chloride: 106 mmol/L (ref 98–111)
Creatinine, Ser: 0.74 mg/dL (ref 0.44–1.00)
GFR, Estimated: >60 mL/min (ref >60)
Glucose, Bld: 96 mg/dL (ref 70–99)
Potassium: 3.7 mmol/L (ref 3.5–5.1)
Sodium: 140 mmol/L (ref 135–145)

## 2023-07-18 MED ORDER — ACETAMINOPHEN 500 MG PO TABS
1000.0000 mg | ORAL_TABLET | Freq: Once | ORAL | Status: AC
  Administered 2023-07-18: 1000 mg via ORAL
  Filled 2023-07-18: qty 2

## 2023-07-18 NOTE — ED Triage Notes (Signed)
 C/O chest pain and left leg pain with twisting.  Left leg numb, for quite a while.  Getting worse.

## 2023-07-18 NOTE — ED Notes (Signed)
 Discharge instructions reviewed.   Opportunity for questions and concerns provided.   Alert, oriented and ambulatory.   Displays no signs of distress.   Encouraged to follow up with PCP and Neurology Monday.

## 2023-07-18 NOTE — ED Provider Notes (Signed)
 [facility] Provider Note    Event Date/Time   First MD Initiated Contact with Patient 07/18/23 1631     (approximate)   History   Chest pain   HPI  Janet [name] is a 73 y.o. female who presents to the emergency department today because of concerns for chest pain.  The pain started this morning.  It is worse when she moves.  She thinks she might have pulled a muscle in her chest.  She denies any associated neck or arm pain.  She has not had any associated shortness of breath.  Denies similar symptoms in the past.  In addition she complains of left leg change in sensation.  This has been ongoing for months.  Seems to gradually be getting worse.  She denies any weakness or difficulty with ambulation.     Physical Exam   Triage Vital Signs: ED Triage Vitals  Encounter Vitals Group     BP 07/18/23 1619 (!) 144/71     Girls Systolic BP Percentile --      Girls Diastolic BP Percentile --      Boys Systolic BP Percentile --      Boys Diastolic BP Percentile --      Pulse Rate 07/18/23 1619 72     Resp 07/18/23 1619 18     Temp 07/18/23 1619 98.6 F (37 C)     Temp src --      SpO2 07/18/23 1619 99 %     Weight 07/18/23 1616 188 lb 7.9 oz (85.5 kg)     Height --      Head Circumference --      Peak Flow --      Pain Score 07/18/23 1616 0     Pain Loc --      Pain Education --      Exclude from Growth Chart --     Most recent vital signs: Vitals:   07/18/23 1619  BP: (!) 144/71  Pulse: 72  Resp: 18  Temp: 98.6 F (37 C)  SpO2: 99%   General: Awake, alert, oriented. CV:  Good peripheral perfusion. Regular rate and rhythm. Resp:  Normal effort. Lungs clear. Abd:  No distention.  Ext:  DP 2+ bilaterally. Sensation in lower extremities intact.   ED Results / Procedures / Treatments   Labs (all labs ordered are listed, but only abnormal results are displayed) Labs Reviewed  CBC - Abnormal; Notable for the following components:      Result  Value   RBC 3.61 (*)    MCV 104.2 (*)    All other components within normal limits  BASIC METABOLIC PANEL WITH GFR  TROPONIN I (HIGH SENSITIVITY)  TROPONIN I (HIGH SENSITIVITY)     EKG  I, Guadalupe Eagles, attending physician, personally viewed and interpreted this EKG  EKG Time: 1619 Rate: 72 Rhythm: normal sinus rhythm Axis: normal Intervals: qtc 363 QRS: narrow ST changes: no st elevation Impression: normal ekg   RADIOLOGY I independently interpreted and visualized the CXR. My interpretation: No pneumonia Radiology interpretation:  IMPRESSION:  No active disease.      PROCEDURES:  Critical Care performed: No   MEDICATIONS ORDERED IN ED: Medications - No data to display   IMPRESSION / MDM / ASSESSMENT AND PLAN / ED COURSE  I reviewed the triage vital signs and the nursing notes.  Differential diagnosis includes, but is not limited to, ACS, pneumonia, chest wall inflammation, esophagitis  Patient's presentation is most consistent with acute presentation with potential threat to life or bodily function.   The patient is on the cardiac monitor to evaluate for evidence of arrhythmia and/or significant heart rate changes.  Patient presented to the emergency department today because of concerns for chest pain that started this morning.  It is worse with movement.  EKG without concerning arrhythmia or ST elevation.  Chest x-ray without pneumonia or pneumothorax.  Troponin negative x 2.  No leukocytosis.  At this time I doubt PE or dissection given clinical history.  I do think more likely musculoskeletal pain.  I do think would be reasonable for patient to be discharged.  In terms of the patient's paresthesia of her left leg given that has been going on for months I do not feel any acute emergent workup is necessary at this time.  Will give patient neurology follow-up information.      FINAL CLINICAL IMPRESSION(S) / ED DIAGNOSES    Final diagnoses:  Chest pain, unspecified type  Paresthesia      Note:  This document was prepared using Dragon voice recognition software and may include unintentional dictation errors.    Floy Roberts, MD 07/18/23 [id number]

## 2023-08-16 ENCOUNTER — Other Ambulatory Visit: Payer: Self-pay | Admitting: Physician Assistant

## 2023-08-16 DIAGNOSIS — R413 Other amnesia: Secondary | ICD-10-CM

## 2023-08-16 DIAGNOSIS — R200 Anesthesia of skin: Secondary | ICD-10-CM

## 2023-08-16 DIAGNOSIS — R42 Dizziness and giddiness: Secondary | ICD-10-CM

## 2023-08-16 DIAGNOSIS — R29898 Other symptoms and signs involving the musculoskeletal system: Secondary | ICD-10-CM

## 2023-08-16 DIAGNOSIS — H539 Unspecified visual disturbance: Secondary | ICD-10-CM

## 2023-08-16 DIAGNOSIS — M545 Low back pain, unspecified: Secondary | ICD-10-CM

## 2023-08-19 ENCOUNTER — Encounter: Payer: Self-pay | Admitting: Physician Assistant

## 2023-08-24 ENCOUNTER — Telehealth: Payer: Self-pay

## 2023-08-24 NOTE — Telephone Encounter (Signed)
 Attempted to reach patient about the following MRI @ [facility] ([place]):    No answer., No identified VM to leave message. No MyChart access.  WENDI Dixon CMA

## 2023-08-26 ENCOUNTER — Ambulatory Visit
Admission: RE | Admit: 2023-08-26 | Discharge: 2023-08-26 | Disposition: A | Discharge status: Home / Self Care | Source: Ambulatory Visit | Attending: Physician Assistant | Admitting: Physician Assistant

## 2023-08-26 DIAGNOSIS — R202 Paresthesia of skin: Secondary | ICD-10-CM

## 2023-08-26 DIAGNOSIS — G8929 Other chronic pain: Secondary | ICD-10-CM

## 2023-08-26 DIAGNOSIS — R29898 Other symptoms and signs involving the musculoskeletal system: Secondary | ICD-10-CM

## 2023-08-26 DIAGNOSIS — H539 Unspecified visual disturbance: Secondary | ICD-10-CM

## 2023-08-26 DIAGNOSIS — R413 Other amnesia: Secondary | ICD-10-CM

## 2023-08-26 DIAGNOSIS — R200 Anesthesia of skin: Secondary | ICD-10-CM

## 2023-08-26 DIAGNOSIS — R42 Dizziness and giddiness: Secondary | ICD-10-CM

## 2023-08-26 DIAGNOSIS — M545 Low back pain, unspecified: Secondary | ICD-10-CM

## 2023-08-29 ENCOUNTER — Encounter: Payer: Self-pay | Admitting: Emergency Medicine

## 2023-08-29 ENCOUNTER — Emergency Department
Admission: EM | Admit: 2023-08-29 | Discharge: 2023-08-29 | Disposition: A | Discharge status: Home / Self Care | Attending: Emergency Medicine | Admitting: Emergency Medicine

## 2023-08-29 ENCOUNTER — Emergency Department

## 2023-08-29 ENCOUNTER — Other Ambulatory Visit: Payer: Self-pay

## 2023-08-29 ENCOUNTER — Ambulatory Visit
Admission: EM | Admit: 2023-08-29 | Discharge: 2023-08-29 | Disposition: A | Discharge status: Home / Self Care | Attending: Emergency Medicine | Admitting: Emergency Medicine

## 2023-08-29 DIAGNOSIS — J45909 Unspecified asthma, uncomplicated: Secondary | ICD-10-CM | POA: Insufficient documentation

## 2023-08-29 DIAGNOSIS — R42 Dizziness and giddiness: Secondary | ICD-10-CM

## 2023-08-29 DIAGNOSIS — H538 Other visual disturbances: Secondary | ICD-10-CM | POA: Insufficient documentation

## 2023-08-29 DIAGNOSIS — I10 Essential (primary) hypertension: Secondary | ICD-10-CM | POA: Insufficient documentation

## 2023-08-29 LAB — COMPREHENSIVE METABOLIC PANEL WITH GFR
ALT: 10 U/L (ref 0–44)
AST: 19 U/L (ref 15–41)
Albumin: 3.8 g/dL (ref 3.5–5.0)
Alkaline Phosphatase: 42 U/L (ref 38–126)
Anion gap: 9 (ref 5–15)
BUN: 16 mg/dL (ref 8–23)
CO2: 22 mmol/L (ref 22–32)
Calcium: 9.8 mg/dL (ref 8.9–10.3)
Chloride: 104 mmol/L (ref 98–111)
Creatinine, Ser: 0.76 mg/dL (ref 0.44–1.00)
GFR, Estimated: >60 mL/min (ref >60)
Glucose, Bld: 100 mg/dL — ABNORMAL HIGH (ref 70–99)
Potassium: 3.5 mmol/L (ref 3.5–5.1)
Sodium: 135 mmol/L (ref 135–145)
Total Bilirubin: 0.9 mg/dL (ref 0.0–1.2)
Total Protein: 7.8 g/dL (ref 6.5–8.1)

## 2023-08-29 LAB — CBC
HCT: 37.4 % (ref 36.0–46.0)
Hemoglobin: 12.3 g/dL (ref 12.0–15.0)
MCH: 34.4 pg — ABNORMAL HIGH (ref 26.0–34.0)
MCHC: 32.9 g/dL (ref 30.0–36.0)
MCV: 104.5 fL — ABNORMAL HIGH (ref 80.0–100.0)
Platelets: 240 10*3/uL (ref 150–400)
RBC: 3.58 MIL/uL — ABNORMAL LOW (ref 3.87–5.11)
RDW: 13.1 % (ref 11.5–15.5)
WBC: 5.6 10*3/uL (ref 4.0–10.5)
nRBC: 0 % (ref 0.0–0.2)

## 2023-08-29 LAB — URINALYSIS, ROUTINE W REFLEX MICROSCOPIC
Bilirubin Urine: NEGATIVE
Glucose, UA: NEGATIVE mg/dL
Hgb urine dipstick: NEGATIVE
Ketones, ur: NEGATIVE mg/dL
Leukocytes,Ua: NEGATIVE
Nitrite: NEGATIVE
Protein, ur: NEGATIVE mg/dL
Specific Gravity, Urine: 1.013 (ref 1.005–1.030)
pH: 5 (ref 5.0–8.0)

## 2023-08-29 NOTE — ED Provider Notes (Signed)
 [facility]    CSN: [id number] Arrival date & time: 08/29/23  1640      History   Chief Complaint Chief Complaint  Patient presents with   Dizziness    HPI Janet [name] is a 73 y.o. female.   HPI  73 year old female with past medical history significant for left renal mass, conversion disorder, essential hypertension, asthma, clotting disorder, and thyroid  disease presents for evaluation of dizziness and blurred vision that started today.  She took 2 ibuprofen  to help with the dizziness but reports that it did not provide any relief.  Past Medical History:  Diagnosis Date   Arthritis    Asthma    Cancer (HCC)    kidney   Clotting disorder (HCC)    Hypertension    Hypertension    Kidney mass    PONV (postoperative nausea and vomiting)    Shortness of breath    Thyroid  disease    Varicose vein of leg    with leg ulcer    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   Left hip pain 11/01/2017   Headache disorder 08/29/2017   Premature ventricular contractions 05/25/2016   Asthma in adult without complication 04/23/2016   Heart palpitations 04/23/2016   History of kidney cancer 04/23/2016   Bilateral cellulitis of lower leg 10/03/2015   Renal mass, left 02/24/2015   Thrombophlebitis 09/29/2014   Chest pressure 09/12/2014   Dyspnea on exertion 09/12/2014   Generalized weakness 09/09/2014   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    Past Surgical History:  Procedure Laterality Date   ABDOMINAL HYSTERECTOMY     BREAST BIOPSY Left 05/02/2022   Us  Core Bx, - path pending   COLONOSCOPY WITH PROPOFOL  N/A 05/25/2019   Procedure: COLONOSCOPY WITH PROPOFOL ;  Surgeon: Unk Corinn Skiff, MD;  Location:  [facility];  Service: Gastroenterology;  Laterality: N/A;   IR RADIOLOGIST EVAL & MGMT  11/02/2021   RADIOLOGY WITH ANESTHESIA Left 12/20/2021   Procedure: Cryoablation of left renal mass;  Surgeon: Mir, Aliene SAUNDERS, MD;  Location: [facility];  Service: Radiology;  Laterality: Left;   VARICOSE VEIN SURGERY      OB History     Gravida  3   Para      Term      Preterm      AB      Living  2      SAB      IAB      Ectopic      Multiple      Live Births               Home Medications    Prior to Admission medications   Medication Sig Start Date End Date Taking? Authorizing Provider  albuterol  (PROVENTIL  HFA;VENTOLIN  HFA) 108 (90 BASE) MCG/ACT inhaler Inhale 2 puffs into the lungs every 6 (six) hours as needed for wheezing or shortness of breath. 12/24/14   Goodman, Graydon, MD  aspirin  EC 81 MG tablet Take 81 mg by mouth daily.    [provider]  Butalbital-APAP-Caffeine 50-300-40 MG CAPS  07/30/17   [provider]  Elastic Bandages & Supports (EMS ANTI-EMBOLISM STOCKINGS) MISC Apply in the morning and remove at night.  06/23/14   [provider]  famotidine  (PEPCID ) 20 MG tablet Take 1 tablet (20 mg total) by mouth 2 (two) times daily. 08/23/21   Brimage, Vondra, DO  lidocaine  (XYLOCAINE ) 2 % solution Use as directed 15 mLs in the mouth or throat every 3 (three) hours as needed for mouth pain. 08/23/21   Brimage, Vondra, DO  olmesartan  (BENICAR ) 40 MG tablet Take 40 mg by mouth daily. 11/25/21   [provider]  potassium chloride  SA (K-DUR,KLOR-CON ) 20 MEQ tablet Take 20 mEq by mouth daily.     [provider]  linaclotide  (LINZESS ) 145 MCG CAPS capsule Take 1 capsule (145 mcg total) by mouth daily before breakfast. 09/30/19 05/30/20  Vanga, Corinn Skiff, MD    Family History Family History  Problem Relation Age of Onset   Colon cancer Mother 89   Hypertension Mother    Heart attack Father 27   Hypertension Father     Hyperlipidemia Father    Diabetes Father    Heart attack Brother    Heart attack Brother    Heart attack Daughter    Heart Problems Son    Stroke Neg Hx    Breast cancer Neg Hx     Social History Social History   Tobacco Use   Smoking status: Former    Current packs/day: 0.00    Types: Cigarettes    Start date: 1990    Quit date: 1995    Years since quitting: 30.6    Passive exposure: Past (Long time ago i or 2 cigarettes)   Smokeless tobacco: Never   Tobacco comments:    1 cig daily--quit around 1995  Vaping Use   Vaping status: Never Used  Substance Use Topics   Alcohol use: No   Drug use: No     Allergies   Amlodipine, Shellfish allergy, Thiazide-type diuretics, and Penicillins   Review of Systems Review of Systems  Eyes:  Positive for visual disturbance.  Neurological:  Positive for dizziness, numbness and headaches. Negative for weakness.     Physical Exam Triage Vital Signs ED Triage Vitals  Encounter Vitals Group     BP --      Girls Systolic BP Percentile --      Girls Diastolic BP Percentile --      Boys Systolic BP Percentile --      Boys Diastolic BP Percentile --      Pulse --      Resp --      Temp --      Temp src --      SpO2 --      Weight 08/29/23 1747 189 lb (85.7 kg)     Height 08/29/23 1747 5' 6 (1.676 m)     Head Circumference --      Peak Flow --      Pain Score 08/29/23 1746 0     Pain Loc --      Pain Education --      Exclude from Growth Chart --    No data found.  Updated Vital Signs BP 135/64 (BP Location: Left Arm)   Pulse 71   Temp 98.2 F (36.8 C) (Oral)   Resp 20   Ht 5' 6 (1.676 m)   Wt 189 lb (85.7 kg)   SpO2 100%   BMI 30.51 kg/m   Visual Acuity Right Eye Distance:   Left Eye Distance:   Bilateral Distance:    Right Eye Near:   Left Eye  Near:    Bilateral Near:     Physical Exam Vitals and nursing note reviewed.  Constitutional:      Appearance: Normal appearance. She is not ill-appearing.   HENT:     Head: Normocephalic and atraumatic.  Eyes:     General:        Right eye: No discharge.        Left eye: No discharge.     Extraocular Movements: Extraocular movements intact.     Pupils: Pupils are equal, round, and reactive to light.  Cardiovascular:     Rate and Rhythm: Normal rate and regular rhythm.     Pulses: Normal pulses.     Heart sounds: Normal heart sounds. No murmur heard.    No friction rub. No gallop.  Pulmonary:     Effort: Pulmonary effort is normal.     Breath sounds: Normal breath sounds. No wheezing, rhonchi or rales.  Skin:    General: Skin is warm and dry.     Capillary Refill: Capillary refill takes less than 2 seconds.  Neurological:     General: No focal deficit present.     Mental Status: She is alert and oriented to person, place, and time.      UC Treatments / Results  Labs (all labs ordered are listed, but only abnormal results are displayed) Labs Reviewed - No data to display  EKG Sinus rhythm with first-degree AV block Ventricular rate 66 bpm PR interval 260 ms QRS duration 78 ms QT/QTc 360/377 ms Patient has peaked T waves in leads V2 through V6.  Radiology No results found.  Procedures Procedures (including critical care time)  Medications Ordered in UC Medications - No data to display  Initial Impression / Assessment and Plan / UC Course  I have reviewed the triage vital signs and the nursing notes.  Pertinent labs & imaging results that were available during my care of the patient were reviewed by me and considered in my medical decision making (see chart for details).   Patient is a nontoxic-appearing 73 year old female presenting for evaluation of headache, blurred vision, dizziness that began this morning after she was outside raking leaves.  She reports she became hot and then the dizziness developed.  It has been constant ever since.  She has also had some mild shortness of breath but she denies any syncope or  chest pain.  She is endorsing the left leg numbness but this is an ongoing issue.  She recently had an MRI which showed L5-S1 impingement with pressure on bilateral sciatic nerves.  She is alert and oriented x 3 and she is moving all extremities independently.  Her pupils are equal and reactive and EOMs intact.  Cranial nerves II through XII are grossly intact.  I did an EKG which shows sinus rhythm with first-degree AV block.  The first-degree AV block is new since EKG from 07/18/2023.  Patient also has peaked T waves in leads V2 through V6.  She takes olmesartan  and potassium daily and I am concerned she may have elevated potassium.  I also feel she needs a head CT to rule out any acute intracranial process.  I have discussed this with the patient and she is in agreement and she will go to [facility] via POV.   Final Clinical Impressions(s) / UC Diagnoses   Final diagnoses:  Dizziness     Discharge Instructions      As we discussed, you need a head CT to evaluate the dizziness,  blurred vision, and headache that you are experiencing.  Your EKG also shows that you have peaked T waves which could indicate that your potassium is high.  You will need blood work to check your potassium and possibly medications to bring it down.  Therefore, we have recommended you go to the emergency department.  Please go to [facility] for evaluation of these conditions.     ED Prescriptions   None    PDMP not reviewed this encounter.   Bernardino Ditch, NP 08/29/23 [phone]

## 2023-08-29 NOTE — ED Triage Notes (Addendum)
 Patient states blurred vision and dizziness that started today.  Feels tired, no chest pain, not a diabetic.  Drove herself to clinic.  Took 2 OTC Ibuprfen just because of the dizziness she states but no relief.  Patient also mentions a bulge in her vagina thinks her uterus has fallen, she has to push this bulge up to urinate and when she sits down feels like she's sitting on it, states she told PCP and they recommended a specialist but she hasn't seen anyone yet.

## 2023-08-29 NOTE — ED Notes (Signed)
 Patient is being discharged from the Urgent Care and sent to the Emergency Department via personal vehicle . Per Harriette, NP, patient is in need of higher level of care due to Dizziness. Patient is aware and verbalizes understanding of plan of care.  Vitals:   08/29/23 1754  BP: 135/64  Pulse: 71  Resp: 20  Temp: 98.2 F (36.8 C)  SpO2: 100%

## 2023-08-29 NOTE — Discharge Instructions (Addendum)
 As we discussed, you need a head CT to evaluate the dizziness, blurred vision, and headache that you are experiencing.  Your EKG also shows that you have peaked T waves which could indicate that your potassium is high.  You will need blood work to check your potassium and possibly medications to bring it down.  Therefore, we have recommended you go to the emergency department.  Please go to [facility] for evaluation of these conditions.

## 2023-08-29 NOTE — ED Triage Notes (Signed)
 Patient c/o dizziness and blurred vision that started at 0930 this AM.  Patient reports she was completely normal before then.  Patient seen at [facility] and was told she had peaked t waves and needed to be seen at ER for possible high potassium.  Patient states that she still has blurred vision and dizziness. Patient has no other neuro deficits. Patient gives verbal consent for [facility].

## 2023-08-29 NOTE — ED Provider Notes (Signed)
 [facility] Provider Note   Event Date/Time   First MD Initiated Contact with Patient 08/29/23 2134     (approximate) History  Dizziness  HPI Janet [name] is a 73 y.o. female with a past medical history of hypertension, thyroid  disease, asthma, and known renal mass who presents complaining of of lightheadedness and blurred vision that began approximately 1030 this morning and resolved over the course of the day.  Patient denies any current blurred vision or dizziness.  Patient denies any weakness/numbness/paresthesias in any extremity.  Patient was seen in urgent care earlier today and told to present to the emergency department for possible high potassium given peaked T waves that were seen on her EKG.  Patient denies any chest pain, palpitations ROS: Patient currently denies any vision changes, tinnitus, difficulty speaking, facial droop, sore throat, chest pain, shortness of breath, abdominal pain, nausea/vomiting/diarrhea, dysuria, or weakness/numbness/paresthesias in any extremity   Physical Exam  Triage Vital Signs: ED Triage Vitals  Encounter Vitals Group     BP 08/29/23 2014 (!) 132/55     Girls Systolic BP Percentile --      Girls Diastolic BP Percentile --      Boys Systolic BP Percentile --      Boys Diastolic BP Percentile --      Pulse Rate 08/29/23 2014 84     Resp 08/29/23 2014 18     Temp 08/29/23 2014 99.2 F (37.3 C)     Temp Source 08/29/23 2014 Oral     SpO2 08/29/23 2014 98 %     Weight 08/29/23 2015 189 lb (85.7 kg)     Height --      Head Circumference --      Peak Flow --      Pain Score 08/29/23 2015 0     Pain Loc --      Pain Education --      Exclude from Growth Chart --    Most recent vital signs: Vitals:   08/29/23 2014  BP: (!) 132/55  Pulse: 84  Resp: 18  Temp: 99.2 F (37.3 C)  SpO2: 98%   General: Awake, oriented x4. CV:  Good peripheral perfusion. Resp:  Normal effort. Abd:  No distention. Other:  Elderly  obese African-American female resting comfortably in no acute distress.  NIHSS 0 ED Results / Procedures / Treatments  Labs (all labs ordered are listed, but only abnormal results are displayed) Labs Reviewed  COMPREHENSIVE METABOLIC PANEL WITH GFR - Abnormal; Notable for the following components:      Result Value   Glucose, Bld 100 (*)    All other components within normal limits  CBC - Abnormal; Notable for the following components:   RBC 3.58 (*)    MCV 104.5 (*)    MCH 34.4 (*)    All other components within normal limits  URINALYSIS, ROUTINE W REFLEX MICROSCOPIC - Abnormal; Notable for the following components:   Color, Urine YELLOW (*)    APPearance CLEAR (*)    All other components within normal limits  CBG MONITORING, ED   EKG ED ECG REPORT I, Artist MARLA Kerns, the attending physician, personally viewed and interpreted this ECG. Date: 08/29/2023 EKG Time: 2018 Rate: 81 Rhythm: normal sinus rhythm QRS Axis: normal Intervals: normal ST/T Wave abnormalities: normal Narrative Interpretation: no evidence of acute ischemia RADIOLOGY ED MD interpretation: CT of the head without contrast interpreted by me shows no evidence of acute abnormalities including no intracerebral  hemorrhage, obvious masses, or significant edema - All radiology independently interpreted and agree with radiology assessment Official radiology report(s): CT Head Wo Contrast Result Date: 08/29/2023 CLINICAL DATA:  Neuro deficit, acute, stroke suspected transient blurred vision EXAM: CT HEAD WITHOUT CONTRAST TECHNIQUE: Contiguous axial images were obtained from the base of the skull through the vertex without intravenous contrast. RADIATION DOSE REDUCTION: This exam was performed according to the departmental dose-optimization program which includes automated exposure control, adjustment of the mA and/or kV according to patient size and/or use of iterative reconstruction technique. COMPARISON:  01/02/2022  FINDINGS: Brain: Normal anatomic configuration. No abnormal intra or extra-axial mass lesion or fluid collection. No abnormal mass effect or midline shift. No evidence of acute intracranial hemorrhage or infarct. Ventricular size is normal. Cerebellum unremarkable. Vascular: Unremarkable Skull: Intact Sinuses/Orbits: Paranasal sinuses are clear. Orbits are unremarkable. Other: Mastoid air cells and middle ear cavities are clear. IMPRESSION: 1. No acute intracranial abnormality. Electronically Signed   By: Dorethia Molt M.D.   On: 08/29/2023 23:13   PROCEDURES: Critical Care performed: No Procedures MEDICATIONS ORDERED IN ED: Medications - No data to display IMPRESSION / MDM / ASSESSMENT AND PLAN / ED COURSE  I reviewed the triage vital signs and the nursing notes.                             The patient is on the cardiac monitor to evaluate for evidence of arrhythmia and/or significant heart rate changes. Patient's presentation is most consistent with acute presentation with potential threat to life or bodily function. Based on History, Exam, and Findings, presentation not consistent with syncope, seizure, stroke, meningitis, symptomatic anemia (gastrointestinal bleed), Increased ICP (cerebral tumor/mass), ICH. Additionally, I have a low suspicion for AOM, labyrinthitis, or other infectious process.  Reassessment: Prior to discharge symptoms controlled, patient well appearing. Disposition:  Discharge. Strict return precautions discussed w/ full understanding. Advise follow up with primary care provider within 24-48 hours.   FINAL CLINICAL IMPRESSION(S) / ED DIAGNOSES   Final diagnoses:  Lightheadedness  Blurred vision, bilateral   Rx / DC Orders   ED Discharge Orders     None      Note:  This document was prepared using Dragon voice recognition software and may include unintentional dictation errors.   Sharena Dibenedetto K, MD 08/29/23 [phone]

## 2023-09-21 ENCOUNTER — Ambulatory Visit
Admission: EM | Admit: 2023-09-21 | Discharge: 2023-09-21 | Disposition: A | Discharge status: Home / Self Care | Attending: Family Medicine | Admitting: Family Medicine

## 2023-09-21 DIAGNOSIS — R509 Fever, unspecified: Secondary | ICD-10-CM

## 2023-09-21 DIAGNOSIS — L03116 Cellulitis of left lower limb: Secondary | ICD-10-CM

## 2023-09-21 MED ORDER — ACETAMINOPHEN 325 MG PO TABS
975.0000 mg | ORAL_TABLET | Freq: Once | ORAL | Status: AC
  Administered 2023-09-21: 975 mg via ORAL

## 2023-09-21 MED ORDER — DOXYCYCLINE HYCLATE 100 MG PO CAPS: 100.0000 mg | ORAL_CAPSULE | Freq: Two times a day (BID) | ORAL | 0 refills | Status: AC

## 2023-09-21 MED ORDER — PREDNISONE 10 MG (21) PO TBPK: ORAL_TABLET | Freq: Every day | ORAL | 0 refills | Status: AC

## 2023-09-21 NOTE — Discharge Instructions (Addendum)
 Stop by the pharmacy to pick up your prescriptions.  Follow up with your primary care provider or return to the urgent care, if not improving.   Go to ED for red flag symptoms, including; fevers you cannot reduce with Tylenol/Motrin, severe headaches, vision changes, numbness/weakness in part of the body, lethargy, confusion, intractable vomiting, severe dehydration, chest pain, breathing difficulty, severe persistent abdominal or pelvic pain, signs of severe infection (increased redness, swelling of an area), feeling faint or passing out, dizziness, etc. You should especially go to the ED for sudden acute worsening of condition if you do not elect to go at this time.

## 2023-09-21 NOTE — ED Triage Notes (Addendum)
 Pt c/o R hand swelling & body rash x1 day. Concerned for poss insect bite. States very itchy. No OTC meds.

## 2023-09-21 NOTE — ED Provider Notes (Signed)
 [facility]    CSN: [id number] Arrival date & time: 09/21/23  1513      History   Chief Complaint Chief Complaint  Patient presents with  . Hand Problem  . Rash    HPI Janet [name] is a 73 y.o. female.   HPI  Janet [name] presents for red itchy had    I feel good.    There is been no new products including soaps and detergents.  No eye irritation, sore throat, difficulty breathing, nausea, vomiting or diarrhea.  Denies belly pain, joint pain and fever.  There has been no medication changes or new supplements.  Denies any new foods or drinks.    ***Redness, pain, swelling ***Treatments tried ***Previous symptoms ***Insect or bug bites  Fever : no Vision changes: No Sore throat: no   Shortness of breath: no Rhinorrhea: no Appetite: normal  Hydration: normal  Abdominal pain: no Nausea: no Vomiting: no Diarrhea: no Dysuria: no  Sleep disturbance: no Arthralgias: no Headache: no   Past Medical History:  Diagnosis Date  . Arthritis   . Asthma   . Cancer (HCC)    kidney  . Clotting disorder (HCC)   . Hypertension   . Hypertension   . Kidney mass   . PONV (postoperative nausea and vomiting)   . Shortness of breath   . Thyroid  disease   . Varicose vein of leg    with leg ulcer    Patient Active Problem List   Diagnosis Date Noted  . Hemoptysis 05/16/2023  . Renal mass 12/20/2021  . Genetic testing 05/08/2021  . Numbness and tingling of left lower extremity 06/23/2018  . Essential hypertension 05/02/2018  . Swelling of limb 05/02/2018  . Varicose veins with ulcer, 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  . Left hip pain 11/01/2017  . Headache disorder 08/29/2017  . Premature ventricular contractions 05/25/2016  . Asthma in adult without complication 04/23/2016  . Heart palpitations 04/23/2016  . History of kidney cancer 04/23/2016  . Bilateral cellulitis of lower leg 10/03/2015  . Renal mass, left 02/24/2015  . Thrombophlebitis 09/29/2014  . Chest pressure  09/12/2014  . Dyspnea on exertion 09/12/2014  . Generalized weakness 09/09/2014  . 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 ABDOMINAL HYSTERECTOMY    . BREAST BIOPSY Left 05/02/2022   Us  Core Bx, - path pending  . COLONOSCOPY WITH PROPOFOL  N/A 05/25/2019   Procedure: COLONOSCOPY WITH PROPOFOL ;  Surgeon: Unk Corinn Skiff, MD;  Location: [facility] ENDOSCOPY;  Service: Gastroenterology;  Laterality: N/A;  . IR RADIOLOGIST EVAL & MGMT  11/02/2021  . RADIOLOGY WITH ANESTHESIA Left 12/20/2021   Procedure: Cryoablation of left renal mass;  Surgeon: Mir, Aliene SAUNDERS, MD;  Location: [facility] ORS;  Service: Radiology;  Laterality: Left;  SABRA VARICOSE VEIN SURGERY      OB History     Gravida  3   Para      Term      Preterm      AB      Living  2      SAB      IAB      Ectopic      Multiple      Live Births               Home Medications    Prior to Admission medications   Medication Sig Start Date End Date Taking? Authorizing Provider  albuterol  (PROVENTIL  HFA;VENTOLIN  HFA) 108 (90 BASE)  MCG/ACT inhaler Inhale 2 puffs into the lungs every 6 (six) hours as needed for wheezing or shortness of breath. 12/24/14   Goodman, Graydon, MD  aspirin  EC 81 MG tablet Take 81 mg by mouth daily.    [provider]  Butalbital-APAP-Caffeine 50-300-40 MG CAPS  07/30/17   [provider]  Elastic Bandages & Supports (EMS ANTI-EMBOLISM STOCKINGS) MISC Apply in the morning and remove at night. 06/23/14   [provider]  famotidine  (PEPCID ) 20 MG tablet Take 1 tablet (20 mg total) by mouth 2 (two) times daily. 08/23/21   Aprille Sawhney, DO  lidocaine  (XYLOCAINE ) 2 % solution Use as directed 15 mLs in the mouth or throat every 3 (three) hours as needed for mouth pain. 08/23/21   George Alcantar, DO  olmesartan  (BENICAR ) 40 MG tablet Take 40 mg by mouth daily. 11/25/21   [provider]  potassium chloride  SA  (K-DUR,KLOR-CON ) 20 MEQ tablet Take 20 mEq by mouth daily.     [provider]  linaclotide  (LINZESS ) 145 MCG CAPS capsule Take 1 capsule (145 mcg total) by mouth daily before breakfast. 09/30/19 05/30/20  Unk Corinn Skiff, MD    Family History Family History  Problem Relation Age of Onset  . Colon cancer Mother 75  . Hypertension Mother   . Heart attack Father 63  . Hypertension Father   . Hyperlipidemia Father   . Diabetes Father   . Heart attack Brother   . Heart attack Brother   . Heart attack Daughter   . Heart Problems Son   . Stroke Neg Hx   . Breast cancer Neg Hx     Social History Social History   Tobacco Use  . Smoking status: Former    Current packs/day: 0.00    Types: Cigarettes    Start date: 62    Quit date: 1995    Years since quitting: 30.7    Passive exposure: Past (Long time ago i or 2 cigarettes)  . Smokeless tobacco: Never  . Tobacco comments:    1 cig daily--quit around 1995  Vaping Use  . Vaping status: Never Used  Substance Use Topics  . Alcohol use: No  . Drug use: No     Allergies   Amlodipine, Shellfish allergy, Thiazide-type diuretics, and Penicillins   Review of Systems Review of Systems :negative unless otherwise stated in HPI.      Physical Exam Triage Vital Signs ED Triage Vitals  Encounter Vitals Group     BP 09/21/23 1524 133/78     Girls Systolic BP Percentile --      Girls Diastolic BP Percentile --      Boys Systolic BP Percentile --      Boys Diastolic BP Percentile --      Pulse Rate 09/21/23 1524 (!) 116     Resp 09/21/23 1524 16     Temp 09/21/23 1524 (!) 101.6 F (38.7 C)     Temp Source 09/21/23 1524 Oral     SpO2 09/21/23 1524 96 %     Weight 09/21/23 1524 189 lb (85.7 kg)     Height 09/21/23 1524 5' 6 (1.676 m)     Head Circumference --      Peak Flow --      Pain Score 09/21/23 1527 0     Pain Loc --      Pain Education --      Exclude from Growth Chart --    No data found.  Updated  Vital Signs BP 133/78 (BP Location: Right Arm)   Pulse (!) 116   Temp (!) 101.6 F (38.7 C) (Oral)   Resp 16   Ht 5' 6 (1.676 m)   Wt 85.7 kg   SpO2 96%   BMI 30.51 kg/m   Visual Acuity Right Eye Distance:   Left Eye Distance:   Bilateral Distance:    Right Eye Near:   Left Eye Near:    Bilateral Near:     Physical Exam  GEN: alert, well appearing female, in no acute distress *** EYES: no scleral injection or discharge*** CV: regular rate and rhythm *** RESP: no increased work of breathing, clear to ascultation bilaterally*** MSK: no extremity edema *** NEURO: alert, moves all extremities appropriately SKIN: warm and dry; ***   ***pic  UC Treatments / Results  Labs (all labs ordered are listed, but only abnormal results are displayed) Labs Reviewed - No data to display  EKG   Radiology No results found.  Procedures Procedures (including critical care time)  Medications Ordered in UC Medications - No data to display  Initial Impression / Assessment and Plan / UC Course  I have reviewed the triage vital signs and the nursing notes.  Pertinent labs & imaging results that were available during my care of the patient were reviewed by me and considered in my medical decision making (see chart for details).     Patient is a 73 y.o. femalewho presents for***.  Overall, patient is well-appearing and well-hydrated.  Vital signs stable.  Annison is afebrile.  Exam concerning for ***.  Treat with***steroid ointment. ***No sign of infection to suggest antibiotics or antifungals at this time.  ***Not likely viral exanthem.   Contact Dermatitis Patient is a 73 y.o. female who presents for ***worsening rash for the ***.  Overall, patient is well-appearing and well-hydrated.  Vital signs stable.  Wells CHRISTELLA Stacks is ***afebrile.  History and exam concerning for ***contact dermatitis.  ***Decadron  10 mg IM given.  Treat with ***prednisone  taper and steroid ointment.  Claritin or  Zyrtec twice a day for additional itch relief. No sign of infection to suggest antifungals or antibiotics at this time.    Reviewed expectations regarding course of current medical issues.  All questions asked were answered.  Outlined signs and symptoms indicating need for more acute intervention. Patient verbalized understanding. After Visit Summary given.   Final Clinical Impressions(s) / UC Diagnoses   Final diagnoses:  None   Discharge Instructions   None    ED Prescriptions   None    PDMP not reviewed this encounter.

## 2023-09-25 ENCOUNTER — Encounter: Payer: Self-pay | Admitting: Pulmonary Disease

## 2023-09-25 ENCOUNTER — Ambulatory Visit: Admitting: Pulmonary Disease

## 2023-09-25 VITALS — BP 136/74 | HR 78 | Temp 97.8°F | Ht 66.0 in | Wt 189.8 lb

## 2023-09-25 DIAGNOSIS — J45998 Other asthma: Secondary | ICD-10-CM

## 2023-09-25 DIAGNOSIS — Z87891 Personal history of nicotine dependence: Secondary | ICD-10-CM

## 2023-09-25 DIAGNOSIS — I5189 Other ill-defined heart diseases: Secondary | ICD-10-CM

## 2023-09-25 DIAGNOSIS — R0602 Shortness of breath: Secondary | ICD-10-CM | POA: Diagnosis not present

## 2023-09-25 DIAGNOSIS — J45909 Unspecified asthma, uncomplicated: Secondary | ICD-10-CM | POA: Diagnosis not present

## 2023-09-25 DIAGNOSIS — R0609 Other forms of dyspnea: Secondary | ICD-10-CM

## 2023-09-25 LAB — NITRIC OXIDE: Nitric Oxide: 41

## 2023-09-25 MED ORDER — BUDESONIDE-FORMOTEROL FUMARATE 160-4.5 MCG/ACT IN AERO: 2.0000 | INHALATION_SPRAY | Freq: Two times a day (BID) | RESPIRATORY_TRACT | 11 refills | Status: AC

## 2023-09-25 MED ORDER — ALBUTEROL SULFATE HFA 108 (90 BASE) MCG/ACT IN AERS: 2.0000 | INHALATION_SPRAY | Freq: Four times a day (QID) | RESPIRATORY_TRACT | 2 refills | Status: AC | PRN

## 2023-09-25 NOTE — Patient Instructions (Addendum)
 VISIT SUMMARY:  During your visit, we discussed your recent shortness of breath and asthma management. We reviewed your current medications and symptoms, and made adjustments to better control your asthma. We also talked about your heart health and the importance of staying active.  YOUR PLAN:  -ASTHMA WITH ELEVATED AIRWAY INFLAMMATION: Asthma is a condition where your airways become inflamed and narrow, making it hard to breathe. Your inflammation levels are high, so we are starting you on Symbicort  160/4.5, two inhalations twice a day. We will also check for allergies with blood work at your next visit and perform pulmonary function tests to see how well your lungs are working.  -SHORTNESS OF BREATH: Your shortness of breath has improved with your current treatment, including doxycycline . This was likely due to an asthma flare-up and a recent cold.  -DECONDITIONING: Deconditioning means your body has become less fit, which can make breathing and physical activity harder. Staying active will help improve your heart and lung health.  -DIASTOLIC DYSFUNCTION (STIFF HEART): Diastolic dysfunction means your heart has become stiff, which is common as people age. Your heart tests were otherwise normal. Staying active is important to help manage this condition.  INSTRUCTIONS:  Please start using Symbicort  160/4.5, two inhalations twice a day. At your next visit, we will do blood work to check for allergies and perform pulmonary function tests. Continue to stay active to improve your overall health.

## 2023-09-25 NOTE — Progress Notes (Signed)
 Subjective:    Patient ID: Janet [name], female    DOB: Jul 24, 1950, 73 y.o.   MRN: [id number]  Patient Care Team: [place] CHRISTELLA, Janet [name] as PCP - General (Family Medicine) Marea Selinda RAMAN, Janet [name] as Consulting Physician (Vascular Surgery) Babara Call, Janet [name] as Consulting Physician (Hematology and Oncology) Darron Deatrice LABOR, Janet [name] as Consulting Physician (Cardiology)  Chief Complaint  Patient presents with   Shortness of Breath    Shortness of breath on exertion. Cough and wheezing.     BACKGROUND/INTERVAL:Patient is a 73 year old who follows for the issue of dyspnea on exertion. She was initially evaluated by Dr. Ozell America on 16 March 2021 and did not follow after that. Subsequently saw Izetta Rouleau, NP on 16 May 2023 and at that time prescribed as needed SABA.  Patient was first seen by me on 21 June 2023.   HPI  DATA 06/21/2023 PFTs:FEV1 of 2.22 L or 93% predicted FVC of 2.84 L or 90% predicted, FEV1/FVC of 78%, normal lung volumes, normal diffusion capacity. Findings consistent with very minimal obstructive airways disease by the curvature and the flow-volume loop. Otherwise testing is normal. The test does not explain the severity of symptoms patient is experiencing.  06/21/2023 nitric oxide : 20 ppb  Review of Systems A 10 point review of systems was performed and it is as noted above otherwise negative.   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   Left hip pain 11/01/2017   Headache disorder 08/29/2017   Premature ventricular contractions 05/25/2016   Asthma in adult without complication 04/23/2016   Heart palpitations 04/23/2016   History of kidney cancer 04/23/2016   Bilateral cellulitis of lower leg 10/03/2015   Renal mass,  left 02/24/2015   Thrombophlebitis 09/29/2014   Chest pressure 09/12/2014   Dyspnea on exertion 09/12/2014   Generalized weakness 09/09/2014   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    Social History   Tobacco Use   Smoking status: Former    Current packs/day: 0.00    Types: Cigarettes    Start date: 30    Quit date: 1995    Years since quitting: 30.7    Passive exposure: Past (Long time ago i or 2 cigarettes)   Smokeless tobacco: Never   Tobacco comments:    1 cig daily--quit around 1995  Substance Use Topics   Alcohol use: No    Allergies  Allergen Reactions   Amlodipine Swelling    Other reaction(s): Angioedema   Shellfish Allergy Anaphylaxis   Thiazide-Type Diuretics Other (See Comments)    Other reaction(s): Other (See Comments)   Penicillins Hives and Other (See Comments)    Has patient had a PCN reaction causing immediate rash, facial/tongue/throat swelling, SOB or lightheadedness with hypotension: no Has patient had a PCN reaction causing severe rash involving mucus membranes or skin necrosis: no Has patient had a PCN reaction that required hospitalization? no Has patient had a PCN reaction occurring within the last 10 years: no If all of the above answers are NO, then may proceed with Cephalosporin use.     Current Meds  Medication Sig   albuterol  (VENTOLIN  HFA) 108 (90 Base) MCG/ACT inhaler Inhale 2 puffs into the lungs every 6 (six) hours as needed.  aspirin  EC 81 MG tablet Take 81 mg by mouth daily.   budesonide -formoterol  (SYMBICORT ) 160-4.5 MCG/ACT inhaler Inhale 2 puffs into the lungs 2 (two) times daily.   Butalbital-APAP-Caffeine 50-300-40 MG CAPS    doxycycline  (VIBRAMYCIN ) 100 MG capsule Take 1 capsule (100 mg total) by mouth 2 (two) times daily.   Elastic Bandages & Supports (EMS ANTI-EMBOLISM STOCKINGS) MISC Apply in the morning and remove at night.   famotidine  (PEPCID ) 20 MG tablet Take 1 tablet (20 mg total) by mouth 2 (two) times daily.    lidocaine  (XYLOCAINE ) 2 % solution Use as directed 15 mLs in the mouth or throat every 3 (three) hours as needed for mouth pain.   olmesartan  (BENICAR ) 40 MG tablet Take 40 mg by mouth daily.   potassium chloride  SA (K-DUR,KLOR-CON ) 20 MEQ tablet Take 20 mEq by mouth daily.    predniSONE  (STERAPRED UNI-PAK 21 TAB) 10 MG (21) TBPK tablet Take by mouth daily. Take 6 tabs by mouth daily for 1, then 5 tabs for 1 day, then 4 tabs for 1 day, then 3 tabs for 1 day, then 2 tabs for 1 day, then 1 tab for 1 day.   [DISCONTINUED] albuterol  (PROVENTIL  HFA;VENTOLIN  HFA) 108 (90 BASE) MCG/ACT inhaler Inhale 2 puffs into the lungs every 6 (six) hours as needed for wheezing or shortness of breath.    Immunization History  Administered Date(s) Administered   PFIZER(Purple Top)SARS-COV-2 Vaccination 09/18/2019, 10/12/2019        Objective:     BP 136/74   Pulse 78   Temp 97.8 F (36.6 C) (Temporal)   Ht 5' 6 (1.676 m)   Wt 189 lb 12.8 oz (86.1 kg)   SpO2 99%   BMI 30.63 kg/m   SpO2: 99 %  GENERAL: Well-developed, well-nourished woman, no acute distress.  Fully ambulatory, no conversational dyspnea. HEAD: Normocephalic, atraumatic.  EYES: Pupils equal, round, reactive to light.  No scleral icterus.  MOUTH: Dentition intact, oral mucosa moist.  No thrush. NECK: Supple. No thyromegaly. Trachea midline. No JVD.  No adenopathy. PULMONARY: Good air entry bilaterally.  Coarse, otherwise, no adventitious sounds. CARDIOVASCULAR: S1 and S2. Regular rate and rhythm.  Grade 1-2/6 mitral regurg. murmur. ABDOMEN: Benign. MUSCULOSKELETAL: No joint deformity, no clubbing, no edema.  NEUROLOGIC: No overt focal deficit, no gait disturbance, speech is fluent. SKIN: Intact,warm,dry. PSYCH: Mood and behavior normal.  Lab Results  Component Value Date   NITRICOXIDE 41 09/25/2023  *There is evidence of type II inflammation present *Trend: 20>>41 ppb       Assessment & Plan:     ICD-10-CM   1.  Persistent asthma with undetermined severity  J45.998 Pulmonary function test    2. DOE (dyspnea on exertion)  R06.09 Nitric oxide     Pulmonary function test      Orders Placed This Encounter  Procedures   Nitric oxide    Pulmonary function test    Standing Status:   Future    Expiration Date:   09/24/2024    Where should this test be performed?:   Outpatient Pulmonary    What type of PFT is being ordered?:   Full PFT    Meds ordered this encounter  Medications   budesonide -formoterol  (SYMBICORT ) 160-4.5 MCG/ACT inhaler    Sig: Inhale 2 puffs into the lungs 2 (two) times daily.    Dispense:  10.2 g    Refill:  11   albuterol  (VENTOLIN  HFA) 108 (90 Base) MCG/ACT inhaler    Sig: Inhale 2 puffs  into the lungs every 6 (six) hours as needed.    Dispense:  8 g    Refill:  2   Discussion:    Asthma with elevated airway inflammation Asthma with elevated airway inflammation, as indicated by elevated nitric oxide  levels at 41. Despite current medication, inflammation remains high. Requires maintenance therapy to manage symptoms and prevent exacerbations. Discussed inhaler options, including puffer and powder-based, with preference for puffer due to ease of use. Potential cost issues with inhaler noted, with plan to coordinate with insurance if necessary. - Initiate Symbicort  160/4.5, two inhalations twice a day. - Plan for blood work to check for allergies at next visit, ensuring she is off antibiotics at that time. - Obtain pulmonary function tests (PFTs) at follow-up appointment to monitor lung function.  Shortness of breath Shortness of breath has improved with current treatment, including doxycycline . Likely related to asthma exacerbation and recent cold.  Deconditioning Deconditioning likely contributing to symptoms. Encouraged to stay active to improve cardiovascular and pulmonary health.  Diastolic dysfunction (stiff heart) Diastolic dysfunction noted as heart stiffness, common  with aging. Heart tests otherwise normal. Emphasized importance of staying active to manage condition.      Follow-up in 2 months time call sooner should any new problems arise.  Advised if symptoms do not improve or worsen, to please contact office for sooner follow up or seek emergency care.    I spent 32 minutes of dedicated to the care of this patient on the date of this encounter to include pre-visit review of records, face-to-face time with the patient discussing conditions above, post visit ordering of testing, clinical documentation with the electronic health record, making appropriate referrals as documented, and communicating necessary findings to members of the patients care team.     C. Leita Sanders, Janet [name] Advanced Bronchoscopy PCCM [facility]    *This note was generated using voice recognition software/Dragon and/or AI transcription program.  Despite best efforts to proofread, errors can occur which can change the meaning. Any transcriptional errors that result from this process are unintentional and may not be fully corrected at the time of dictation.

## 2023-09-30 ENCOUNTER — Emergency Department

## 2023-09-30 ENCOUNTER — Other Ambulatory Visit: Payer: Self-pay

## 2023-09-30 DIAGNOSIS — E876 Hypokalemia: Secondary | ICD-10-CM | POA: Diagnosis not present

## 2023-09-30 DIAGNOSIS — J4541 Moderate persistent asthma with (acute) exacerbation: Secondary | ICD-10-CM | POA: Insufficient documentation

## 2023-09-30 DIAGNOSIS — R059 Cough, unspecified: Secondary | ICD-10-CM | POA: Diagnosis not present

## 2023-09-30 DIAGNOSIS — R0602 Shortness of breath: Secondary | ICD-10-CM | POA: Diagnosis present

## 2023-09-30 LAB — BASIC METABOLIC PANEL WITH GFR
Anion gap: 10 (ref 5–15)
BUN: 19 mg/dL (ref 8–23)
CO2: 24 mmol/L (ref 22–32)
Calcium: 9.6 mg/dL (ref 8.9–10.3)
Chloride: 105 mmol/L (ref 98–111)
Creatinine, Ser: 0.84 mg/dL (ref 0.44–1.00)
GFR, Estimated: >60 mL/min (ref >60)
Glucose, Bld: 108 mg/dL — ABNORMAL HIGH (ref 70–99)
Potassium: 3.2 mmol/L — ABNORMAL LOW (ref 3.5–5.1)
Sodium: 139 mmol/L (ref 135–145)

## 2023-09-30 LAB — CBC WITH DIFFERENTIAL/PLATELET
Abs Immature Granulocytes: 0.14 K/uL — ABNORMAL HIGH (ref 0.00–0.07)
Basophils Absolute: 0 K/uL (ref 0.0–0.1)
Basophils Relative: 0 %
Eosinophils Absolute: 0.2 K/uL (ref 0.0–0.5)
Eosinophils Relative: 3 %
HCT: 38.1 % (ref 36.0–46.0)
Hemoglobin: 12.4 g/dL (ref 12.0–15.0)
Immature Granulocytes: 2 %
Lymphocytes Relative: 36 %
Lymphs Abs: 3.2 K/uL (ref 0.7–4.0)
MCH: 35 pg — ABNORMAL HIGH (ref 26.0–34.0)
MCHC: 32.5 g/dL (ref 30.0–36.0)
MCV: 107.6 fL — ABNORMAL HIGH (ref 80.0–100.0)
Monocytes Absolute: 0.5 K/uL (ref 0.1–1.0)
Monocytes Relative: 6 %
Neutro Abs: 4.7 K/uL (ref 1.7–7.7)
Neutrophils Relative %: 53 %
Platelets: 227 K/uL (ref 150–400)
RBC: 3.54 MIL/uL — ABNORMAL LOW (ref 3.87–5.11)
RDW: 13.7 % (ref 11.5–15.5)
WBC: 8.7 K/uL (ref 4.0–10.5)
nRBC: 0 % (ref 0.0–0.2)

## 2023-09-30 LAB — TROPONIN I (HIGH SENSITIVITY)
Troponin I (High Sensitivity): <2 ng/L (ref <18)
Troponin I (High Sensitivity): <2 ng/L (ref <18)

## 2023-09-30 LAB — RESP PANEL BY RT-PCR (RSV, FLU A&B, COVID)  RVPGX2
Influenza A by PCR: NEGATIVE
Influenza B by PCR: NEGATIVE
Resp Syncytial Virus by PCR: NEGATIVE
SARS Coronavirus 2 by RT PCR: NEGATIVE

## 2023-09-30 NOTE — ED Notes (Signed)
 Rainbow of Tubes sent Urine sent also

## 2023-09-30 NOTE — ED Triage Notes (Signed)
 Pt reports difficulty breathing that began a few days ago, pt has hx asthma. Pt reports some cough and congestion also, pt states she also has some chest tightness.

## 2023-10-01 ENCOUNTER — Emergency Department

## 2023-10-01 ENCOUNTER — Emergency Department
Admission: EM | Admit: 2023-10-01 | Discharge: 2023-10-01 | Disposition: A | Discharge status: Home / Self Care | Attending: Emergency Medicine | Admitting: Emergency Medicine

## 2023-10-01 DIAGNOSIS — R0602 Shortness of breath: Secondary | ICD-10-CM

## 2023-10-01 DIAGNOSIS — J4541 Moderate persistent asthma with (acute) exacerbation: Secondary | ICD-10-CM

## 2023-10-01 MED ORDER — IOHEXOL 350 MG/ML SOLN
75.0000 mL | Freq: Once | INTRAVENOUS | Status: AC | PRN
  Administered 2023-10-01: 75 mL via INTRAVENOUS

## 2023-10-01 MED ORDER — DEXAMETHASONE 4 MG PO TABS
10.0000 mg | ORAL_TABLET | Freq: Once | ORAL | Status: AC
  Administered 2023-10-01: 10 mg via ORAL
  Filled 2023-10-01: qty 3

## 2023-10-01 MED ORDER — IPRATROPIUM-ALBUTEROL 0.5-2.5 (3) MG/3ML IN SOLN
9.0000 mL | Freq: Once | RESPIRATORY_TRACT | Status: AC
  Administered 2023-10-01: 9 mL via RESPIRATORY_TRACT
  Filled 2023-10-01: qty 9

## 2023-10-01 NOTE — ED Notes (Signed)
Patient given discharge instructions including importance of follow up appt as needed with stated understanding. INT removed, cannula intact, pressure dressing applied. Patient stable and ambulatory with steady even gait on dispo.

## 2023-10-01 NOTE — ED Notes (Signed)
 Patient taken for CT at this time

## 2023-10-01 NOTE — Discharge Instructions (Signed)
 Your evaluation in the emergency department today did not show any signs of blood clot in the lung or heart attack.  I think that your asthma is working up.  You got a one-time dose of the steroid that should help with your symptoms.  Continue taking your breathing treatments as directed by your pulmonologist.  Your thyroid  looked slightly abnormal in the picture taken today.  Follow-up with your regular doctor to see if you need further testing for your thyroid .  Thank you for choosing us  for your health care today!  Please see your primary doctor this week for a follow up appointment.   If you have any new, worsening, or unexpected symptoms call your doctor right away or come back to the emergency department for reevaluation.  It was my pleasure to care for you today.   Ginnie EDISON Cyrena, MD

## 2023-10-01 NOTE — ED Provider Notes (Signed)
 [facility] Provider Note    Event Date/Time   First MD Initiated Contact with Patient 10/01/23 [phone]     (approximate)   History   Shortness of Breath   HPI  Janet [name] is a 73 y.o. 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  Seen by pulmonologist recently and diagnosed with asthma exacerbation, is weaning her prednisone  taper currently and is somewhat improved with her inhaler treatments at home albeit temporarily.  No fever, but has reported some mild cough and congestion.  No leg swelling or pain.  External Medical Documents Reviewed: Pulmonologist note from earlier in the week      Physical Exam   Triage Vital Signs: ED Triage Vitals  Encounter Vitals Group     BP 09/30/23 1914 (!) 144/58     Girls Systolic BP Percentile --      Girls Diastolic BP Percentile --      Boys Systolic BP Percentile --      Boys Diastolic BP Percentile --      Pulse Rate 09/30/23 1914 87     Resp 09/30/23 1914 17     Temp 09/30/23 1914 98.4 F (36.9 C)     Temp Source 09/30/23 2114 Oral     SpO2 09/30/23 1914 100 %     Weight 09/30/23 1911 189 lb (85.7 kg)     Height 09/30/23 1911 5' 6 (1.676 m)     Head Circumference --      Peak Flow --      Pain Score 09/30/23 1911 7     Pain Loc --      Pain Education --      Exclude from Growth Chart --     Most recent vital signs: Vitals:   10/01/23 0200 10/01/23 0320  BP: 137/65 (!) 150/56  Pulse: 76 93  Resp: 18 16  Temp:  97.6 F (36.4 C)  SpO2: 100% 100%    General: Awake, no distress.  CV:  Good peripheral perfusion.  Resp:  Normal effort.  Abd:  No distention.  Other:  Mild wheezing throughout lung fields no respiratory distress no focality.  100% on room air.  Slightly hypertensive otherwise vital signs normal.   ED Results / Procedures / Treatments    Labs (all labs ordered are listed, but only abnormal results are displayed) Labs Reviewed  CBC WITH DIFFERENTIAL/PLATELET - Abnormal; Notable for the following components:      Result Value   RBC 3.54 (*)    MCV 107.6 (*)    MCH 35.0 (*)    Abs Immature Granulocytes 0.14 (*)    All other components within normal limits  BASIC METABOLIC PANEL WITH GFR - Abnormal; Notable for the following components:   Potassium 3.2 (*)    Glucose, Bld 108 (*)    All other components within normal limits  RESP PANEL BY RT-PCR (RSV, FLU A&B, COVID)  [id number]  TROPONIN I (HIGH SENSITIVITY)  TROPONIN I (HIGH SENSITIVITY)     I ordered and reviewed the above labs they are notable for cell counts and electrolytes reviewed and unremarkable.  EKG  ED ECG REPORT I, Ginnie Shams, the attending physician, personally viewed and interpreted this ECG.   Date: 10/01/2023  EKG Time: 1913  Rate: 77  Rhythm: sinus  Axis: nl  Intervals:nl  ST&T  Change: no stemi    RADIOLOGY I independently reviewed and interpreted chest x-ray and I see no obvious focality or pneumothorax I also reviewed radiologist's formal read.   PROCEDURES:  Critical Care performed: No  Procedures   MEDICATIONS ORDERED IN ED: Medications  ipratropium-albuterol  (DUONEB) 0.5-2.5 (3) MG/3ML nebulizer solution 9 mL (9 mLs Nebulization Given 10/01/23 0221)  dexamethasone  (DECADRON ) tablet 10 mg (10 mg Oral Given 10/01/23 0219)  iohexol  (OMNIPAQUE ) 350 MG/ML injection 75 mL (75 mLs Intravenous Contrast Given 10/01/23 0230)   IMPRESSION / MDM / ASSESSMENT AND PLAN / ED COURSE  I reviewed the triage vital signs and the nursing notes.                                Patient's presentation is most consistent with acute presentation with potential threat to life or bodily function.  Differential diagnosis includes, but is not limited to, asthma, viral URI, bacterial pneumonia, ACS, PE   MDM:   History of asthma with mild wheezing  and a recent pulmonology visit during this course of illness noted to be consistent with asthma exacerbation.  However with clotting disorder history and persistent shortness of breath despite the treatment and prednisone  taper, I think prudent to check for PE.  Fortunately the CT angiogram is negative.  Patient in stable condition with no severe respiratory distress.  Will continue asthma treatment, give a dexamethasone  systemic steroid today and have her follow-up with pulmonologist.  I considered hospitalization for admission or observation however given stability in the emergency department, no hypoxemia or respiratory distress, and negative workup for PE, ACS, I think she can be followed up as outpatient with pulmonologist.        FINAL CLINICAL IMPRESSION(S) / ED DIAGNOSES   Final diagnoses:  SOB (shortness of breath)  Moderate persistent asthma with exacerbation     Rx / DC Orders   ED Discharge Orders     None        Note:  This document was prepared using Dragon voice recognition software and may include unintentional dictation errors.    Cyrena Mylar, MD 10/01/23 [phone]

## 2023-10-02 ENCOUNTER — Emergency Department

## 2023-10-02 ENCOUNTER — Other Ambulatory Visit: Payer: Self-pay

## 2023-10-02 ENCOUNTER — Emergency Department
Admission: EM | Admit: 2023-10-02 | Discharge: 2023-10-02 | Disposition: A | Discharge status: Home / Self Care | Attending: Emergency Medicine | Admitting: Emergency Medicine

## 2023-10-02 DIAGNOSIS — Z79899 Other long term (current) drug therapy: Secondary | ICD-10-CM | POA: Insufficient documentation

## 2023-10-02 DIAGNOSIS — S0990XA Unspecified injury of head, initial encounter: Secondary | ICD-10-CM | POA: Insufficient documentation

## 2023-10-02 DIAGNOSIS — M25552 Pain in left hip: Secondary | ICD-10-CM | POA: Diagnosis not present

## 2023-10-02 DIAGNOSIS — M542 Cervicalgia: Secondary | ICD-10-CM | POA: Insufficient documentation

## 2023-10-02 DIAGNOSIS — I10 Essential (primary) hypertension: Secondary | ICD-10-CM | POA: Diagnosis not present

## 2023-10-02 DIAGNOSIS — M25512 Pain in left shoulder: Secondary | ICD-10-CM | POA: Insufficient documentation

## 2023-10-02 DIAGNOSIS — J45909 Unspecified asthma, uncomplicated: Secondary | ICD-10-CM | POA: Diagnosis not present

## 2023-10-02 DIAGNOSIS — Y92410 Unspecified street and highway as the place of occurrence of the external cause: Secondary | ICD-10-CM | POA: Diagnosis not present

## 2023-10-02 MED ORDER — LIDOCAINE 5 % EX PTCH
1.0000 | MEDICATED_PATCH | CUTANEOUS | Status: DC
  Administered 2023-10-02: 1 via TRANSDERMAL
  Filled 2023-10-02: qty 1

## 2023-10-02 MED ORDER — ACETAMINOPHEN 500 MG PO TABS
1000.0000 mg | ORAL_TABLET | Freq: Once | ORAL | Status: AC
  Administered 2023-10-02: 1000 mg via ORAL
  Filled 2023-10-02: qty 2

## 2023-10-02 MED ORDER — LIDOCAINE 5 % EX PTCH
1.0000 | MEDICATED_PATCH | CUTANEOUS | 0 refills | Status: AC
Start: 2023-10-02 — End: 2023-10-12

## 2023-10-02 MED ORDER — CYCLOBENZAPRINE HCL 10 MG PO TABS
5.0000 mg | ORAL_TABLET | Freq: Once | ORAL | Status: AC
Start: 2023-10-02 — End: 2023-10-02
  Administered 2023-10-02: 5 mg via ORAL
  Filled 2023-10-02: qty 1
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
## 2023-10-11 ENCOUNTER — Encounter: Payer: Self-pay | Admitting: Pulmonary Disease

## 2023-10-21 ENCOUNTER — Ambulatory Visit: Admitting: Pulmonary Disease

## 2023-10-21 DIAGNOSIS — J45998 Other asthma: Secondary | ICD-10-CM | POA: Diagnosis not present

## 2023-10-21 DIAGNOSIS — R0609 Other forms of dyspnea: Secondary | ICD-10-CM

## 2023-10-21 LAB — PULMONARY FUNCTION TEST
DL/VA % pred: 92 %
DL/VA: 3.78 ml/min/mmHg/L
DLCO unc % pred: 71 %
DLCO unc: 14.5 ml/min/mmHg
FEF 25-75 Post: 2.23 L/s
FEF 25-75 Pre: 2.06 L/s
FEF2575-%Change-Post: 8 %
FEF2575-%Pred-Post: 119 %
FEF2575-%Pred-Pre: 109 %
FEV1-%Change-Post: 3 %
FEV1-%Pred-Post: 90 %
FEV1-%Pred-Pre: 88 %
FEV1-Post: 2.15 L
FEV1-Pre: 2.09 L
FEV1FVC-%Change-Post: 1 %
FEV1FVC-%Pred-Pre: 104 %
FEV6-%Change-Post: 2 %
FEV6-%Pred-Post: 89 %
FEV6-%Pred-Pre: 87 %
FEV6-Post: 2.69 L
FEV6-Pre: 2.63 L
FEV6FVC-%Change-Post: 0 %
FEV6FVC-%Pred-Post: 104 %
FEV6FVC-%Pred-Pre: 104 %
FVC-%Change-Post: 1 %
FVC-%Pred-Post: 86 %
FVC-%Pred-Pre: 84 %
FVC-Post: 2.7 L
FVC-Pre: 2.66 L
Post FEV1/FVC ratio: 80 %
Post FEV6/FVC ratio: 100 %
Pre FEV1/FVC ratio: 79 %
Pre FEV6/FVC Ratio: 100 %
RV % pred: 96 %
RV: 2.28 L
TLC % pred: 89 %
TLC: 4.81 L

## 2023-10-21 NOTE — Patient Instructions (Signed)
 Full PFT completed today ? ?

## 2023-10-21 NOTE — Progress Notes (Signed)
 Full PFT completed today ? ?

## 2023-11-29 ENCOUNTER — Encounter: Payer: Self-pay | Admitting: Pulmonary Disease

## 2023-11-29 ENCOUNTER — Ambulatory Visit: Admitting: Pulmonary Disease

## 2024-02-05 ENCOUNTER — Other Ambulatory Visit: Payer: Self-pay | Admitting: Unknown Physician Specialty

## 2024-02-05 ENCOUNTER — Encounter: Payer: Self-pay | Admitting: Unknown Physician Specialty

## 2024-02-05 ENCOUNTER — Encounter: Payer: Self-pay | Admitting: Otolaryngology

## 2024-02-05 DIAGNOSIS — H9041 Sensorineural hearing loss, unilateral, right ear, with unrestricted hearing on the contralateral side: Secondary | ICD-10-CM

## 2024-02-14 ENCOUNTER — Ambulatory Visit
Admission: RE | Admit: 2024-02-14 | Discharge: 2024-02-14 | Disposition: A | Source: Ambulatory Visit | Attending: Unknown Physician Specialty | Admitting: Unknown Physician Specialty

## 2024-02-14 DIAGNOSIS — H9041 Sensorineural hearing loss, unilateral, right ear, with unrestricted hearing on the contralateral side: Secondary | ICD-10-CM

## 2024-02-14 MED ORDER — GADOPICLENOL 0.5 MMOL/ML IV SOLN
10.0000 mL | Freq: Once | INTRAVENOUS | Status: AC | PRN
  Administered 2024-02-14: 10 mL via INTRAVENOUS
